# Patient Record
Sex: Female | Born: 1960
Health system: Southern US, Community
[De-identification: ages and names within clinical notes are randomized; demographics above are authoritative.]

## PROBLEM LIST (undated history)

## (undated) DIAGNOSIS — K257 Chronic gastric ulcer without hemorrhage or perforation: Secondary | ICD-10-CM

## (undated) DIAGNOSIS — M1990 Unspecified osteoarthritis, unspecified site: Secondary | ICD-10-CM

## (undated) DIAGNOSIS — K743 Primary biliary cirrhosis: Secondary | ICD-10-CM

## (undated) DIAGNOSIS — E785 Hyperlipidemia, unspecified: Secondary | ICD-10-CM

## (undated) DIAGNOSIS — K922 Gastrointestinal hemorrhage, unspecified: Secondary | ICD-10-CM

## (undated) DIAGNOSIS — I10 Essential (primary) hypertension: Secondary | ICD-10-CM

## (undated) DIAGNOSIS — F32A Depression, unspecified: Secondary | ICD-10-CM

## (undated) DIAGNOSIS — K5190 Ulcerative colitis, unspecified, without complications: Secondary | ICD-10-CM

## (undated) DIAGNOSIS — K219 Gastro-esophageal reflux disease without esophagitis: Secondary | ICD-10-CM

## (undated) DIAGNOSIS — B20 Human immunodeficiency virus [HIV] disease: Secondary | ICD-10-CM

## (undated) DIAGNOSIS — Z21 Asymptomatic human immunodeficiency virus [HIV] infection status: Secondary | ICD-10-CM

## (undated) DIAGNOSIS — F1910 Other psychoactive substance abuse, uncomplicated: Secondary | ICD-10-CM

## (undated) DIAGNOSIS — IMO0001 Reserved for inherently not codable concepts without codable children: Secondary | ICD-10-CM

## (undated) HISTORY — DX: Unspecified osteoarthritis, unspecified site: M19.90

## (undated) HISTORY — PX: ESOPHAGOGASTRODUODENOSCOPY: SHX1529

## (undated) HISTORY — DX: Reserved for inherently not codable concepts without codable children: IMO0001

## (undated) HISTORY — DX: Hyperlipidemia, unspecified: E78.5

## (undated) HISTORY — DX: Primary biliary cirrhosis: K74.3

## (undated) HISTORY — DX: Chronic gastric ulcer without hemorrhage or perforation: K25.7

## (undated) HISTORY — DX: Depression, unspecified: F32.A

## (undated) HISTORY — PX: COLONOSCOPY: SHX174

---

## 1999-06-18 HISTORY — PX: ABDOMINAL HYSTERECTOMY: SHX81

## 2014-04-25 ENCOUNTER — Encounter (HOSPITAL_BASED_OUTPATIENT_CLINIC_OR_DEPARTMENT_OTHER): Payer: Self-pay | Admitting: *Deleted

## 2014-04-25 ENCOUNTER — Emergency Department (HOSPITAL_BASED_OUTPATIENT_CLINIC_OR_DEPARTMENT_OTHER): Payer: Medicaid Other

## 2014-04-25 ENCOUNTER — Inpatient Hospital Stay (HOSPITAL_BASED_OUTPATIENT_CLINIC_OR_DEPARTMENT_OTHER)
Admission: EM | Admit: 2014-04-25 | Discharge: 2014-05-03 | DRG: 974 | Disposition: A | Discharge status: Home / Self Care | Payer: Medicaid Other | Attending: Internal Medicine | Admitting: Internal Medicine

## 2014-04-25 ENCOUNTER — Inpatient Hospital Stay (HOSPITAL_COMMUNITY): Payer: Medicaid Other

## 2014-04-25 DIAGNOSIS — K5190 Ulcerative colitis, unspecified, without complications: Secondary | ICD-10-CM | POA: Diagnosis present

## 2014-04-25 DIAGNOSIS — B1920 Unspecified viral hepatitis C without hepatic coma: Secondary | ICD-10-CM | POA: Diagnosis present

## 2014-04-25 DIAGNOSIS — Y95 Nosocomial condition: Secondary | ICD-10-CM | POA: Diagnosis present

## 2014-04-25 DIAGNOSIS — F17210 Nicotine dependence, cigarettes, uncomplicated: Secondary | ICD-10-CM | POA: Diagnosis present

## 2014-04-25 DIAGNOSIS — J984 Other disorders of lung: Secondary | ICD-10-CM | POA: Diagnosis not present

## 2014-04-25 DIAGNOSIS — J9601 Acute respiratory failure with hypoxia: Secondary | ICD-10-CM | POA: Diagnosis present

## 2014-04-25 DIAGNOSIS — Z79899 Other long term (current) drug therapy: Secondary | ICD-10-CM

## 2014-04-25 DIAGNOSIS — Z681 Body mass index (BMI) 19 or less, adult: Secondary | ICD-10-CM

## 2014-04-25 DIAGNOSIS — E876 Hypokalemia: Secondary | ICD-10-CM | POA: Diagnosis present

## 2014-04-25 DIAGNOSIS — E43 Unspecified severe protein-calorie malnutrition: Secondary | ICD-10-CM | POA: Insufficient documentation

## 2014-04-25 DIAGNOSIS — B20 Human immunodeficiency virus [HIV] disease: Secondary | ICD-10-CM | POA: Diagnosis present

## 2014-04-25 DIAGNOSIS — J189 Pneumonia, unspecified organism: Secondary | ICD-10-CM | POA: Diagnosis present

## 2014-04-25 DIAGNOSIS — I10 Essential (primary) hypertension: Secondary | ICD-10-CM | POA: Diagnosis present

## 2014-04-25 DIAGNOSIS — R109 Unspecified abdominal pain: Secondary | ICD-10-CM | POA: Diagnosis present

## 2014-04-25 DIAGNOSIS — D473 Essential (hemorrhagic) thrombocythemia: Secondary | ICD-10-CM | POA: Diagnosis present

## 2014-04-25 DIAGNOSIS — Z21 Asymptomatic human immunodeficiency virus [HIV] infection status: Secondary | ICD-10-CM | POA: Diagnosis present

## 2014-04-25 DIAGNOSIS — K625 Hemorrhage of anus and rectum: Secondary | ICD-10-CM | POA: Diagnosis present

## 2014-04-25 DIAGNOSIS — K219 Gastro-esophageal reflux disease without esophagitis: Secondary | ICD-10-CM | POA: Diagnosis present

## 2014-04-25 DIAGNOSIS — E860 Dehydration: Secondary | ICD-10-CM | POA: Diagnosis present

## 2014-04-25 DIAGNOSIS — E871 Hypo-osmolality and hyponatremia: Secondary | ICD-10-CM | POA: Diagnosis present

## 2014-04-25 DIAGNOSIS — I959 Hypotension, unspecified: Secondary | ICD-10-CM | POA: Diagnosis not present

## 2014-04-25 DIAGNOSIS — B9562 Methicillin resistant Staphylococcus aureus infection as the cause of diseases classified elsewhere: Secondary | ICD-10-CM | POA: Diagnosis present

## 2014-04-25 DIAGNOSIS — D638 Anemia in other chronic diseases classified elsewhere: Secondary | ICD-10-CM | POA: Diagnosis present

## 2014-04-25 DIAGNOSIS — Z9889 Other specified postprocedural states: Secondary | ICD-10-CM

## 2014-04-25 DIAGNOSIS — R933 Abnormal findings on diagnostic imaging of other parts of digestive tract: Secondary | ICD-10-CM | POA: Insufficient documentation

## 2014-04-25 DIAGNOSIS — R52 Pain, unspecified: Secondary | ICD-10-CM

## 2014-04-25 DIAGNOSIS — R1030 Lower abdominal pain, unspecified: Secondary | ICD-10-CM

## 2014-04-25 DIAGNOSIS — K51911 Ulcerative colitis, unspecified with rectal bleeding: Secondary | ICD-10-CM

## 2014-04-25 DIAGNOSIS — Z9119 Patient's noncompliance with other medical treatment and regimen: Secondary | ICD-10-CM | POA: Diagnosis present

## 2014-04-25 DIAGNOSIS — E781 Pure hyperglyceridemia: Secondary | ICD-10-CM | POA: Diagnosis not present

## 2014-04-25 HISTORY — DX: Asymptomatic human immunodeficiency virus (hiv) infection status: Z21

## 2014-04-25 HISTORY — DX: Essential (primary) hypertension: I10

## 2014-04-25 HISTORY — DX: Other psychoactive substance abuse, uncomplicated: F19.10

## 2014-04-25 HISTORY — DX: Ulcerative colitis, unspecified, without complications: K51.90

## 2014-04-25 HISTORY — DX: Human immunodeficiency virus (HIV) disease: B20

## 2014-04-25 HISTORY — DX: Gastro-esophageal reflux disease without esophagitis: K21.9

## 2014-04-25 HISTORY — DX: Gastrointestinal hemorrhage, unspecified: K92.2

## 2014-04-25 LAB — URINE MICROSCOPIC-ADD ON

## 2014-04-25 LAB — URINALYSIS, ROUTINE W REFLEX MICROSCOPIC
Bilirubin Urine: NEGATIVE
GLUCOSE, UA: NEGATIVE mg/dL
Ketones, ur: NEGATIVE mg/dL
Nitrite: NEGATIVE
Protein, ur: NEGATIVE mg/dL
SPECIFIC GRAVITY, URINE: 1.031 — AB (ref 1.005–1.030)
Urobilinogen, UA: 0.2 mg/dL (ref 0.0–1.0)
pH: 6 (ref 5.0–8.0)

## 2014-04-25 LAB — COMPREHENSIVE METABOLIC PANEL
ALK PHOS: 377 U/L — AB (ref 39–117)
ALT: 8 U/L (ref 0–35)
AST: 19 U/L (ref 0–37)
Albumin: 2 g/dL — ABNORMAL LOW (ref 3.5–5.2)
Anion gap: 12 (ref 5–15)
BILIRUBIN TOTAL: 0.4 mg/dL (ref 0.3–1.2)
BUN: 17 mg/dL (ref 6–23)
CHLORIDE: 91 meq/L — AB (ref 96–112)
CO2: 28 meq/L (ref 19–32)
Calcium: 9 mg/dL (ref 8.4–10.5)
Creatinine, Ser: 0.8 mg/dL (ref 0.50–1.10)
GFR calc Af Amer: >90 mL/min (ref >90)
GFR, EST NON AFRICAN AMERICAN: 81 mL/min — AB (ref >90)
Glucose, Bld: 162 mg/dL — ABNORMAL HIGH (ref 70–99)
POTASSIUM: 3.3 meq/L — AB (ref 3.7–5.3)
Sodium: 131 mEq/L — ABNORMAL LOW (ref 137–147)
Total Protein: 8.8 g/dL — ABNORMAL HIGH (ref 6.0–8.3)

## 2014-04-25 LAB — CBC WITH DIFFERENTIAL/PLATELET
BASOS ABS: 0 10*3/uL (ref 0.0–0.1)
BASOS PCT: 0 % (ref 0–1)
Eosinophils Absolute: 0 10*3/uL (ref 0.0–0.7)
Eosinophils Relative: 0 % (ref 0–5)
HCT: 25.8 % — ABNORMAL LOW (ref 36.0–46.0)
HEMOGLOBIN: 9.3 g/dL — AB (ref 12.0–15.0)
LYMPHS PCT: 39 % (ref 12–46)
Lymphs Abs: 3.6 10*3/uL (ref 0.7–4.0)
MCH: 28.8 pg (ref 26.0–34.0)
MCHC: 36 g/dL (ref 30.0–36.0)
MCV: 79.9 fL (ref 78.0–100.0)
MONOS PCT: 11 % (ref 3–12)
Monocytes Absolute: 1 10*3/uL (ref 0.1–1.0)
NEUTROS ABS: 4.7 10*3/uL (ref 1.7–7.7)
NEUTROS PCT: 50 % (ref 43–77)
Platelets: 797 10*3/uL — ABNORMAL HIGH (ref 150–400)
RBC: 3.23 MIL/uL — ABNORMAL LOW (ref 3.87–5.11)
RDW: 13.3 % (ref 11.5–15.5)
WBC: 9.3 10*3/uL (ref 4.0–10.5)

## 2014-04-25 LAB — LIPASE, BLOOD: Lipase: 55 U/L (ref 11–59)

## 2014-04-25 LAB — OCCULT BLOOD X 1 CARD TO LAB, STOOL: Fecal Occult Bld: POSITIVE — AB

## 2014-04-25 MED ORDER — VANCOMYCIN HCL 500 MG IV SOLR: 500.0000 mg | Freq: Three times a day (TID) | INTRAVENOUS | Status: DC

## 2014-04-25 MED ORDER — SODIUM CHLORIDE 0.9 % IV BOLUS (SEPSIS)
2000.0000 mL | Freq: Once | INTRAVENOUS | Status: AC
Start: 2014-04-25 — End: 2014-04-25
  Administered 2014-04-25: 2000 mL via INTRAVENOUS

## 2014-04-25 MED ORDER — METHYLPREDNISOLONE SODIUM SUCC 125 MG IJ SOLR
60.0000 mg | Freq: Once | INTRAMUSCULAR | Status: AC
  Administered 2014-04-25: 60 mg via INTRAVENOUS
  Filled 2014-04-25: qty 2

## 2014-04-25 MED ORDER — SODIUM CHLORIDE 0.9 % IV SOLN
INTRAVENOUS | Status: DC
  Administered 2014-04-26: 07:00:00 via INTRAVENOUS
  Administered 2014-04-27: 1000 mL via INTRAVENOUS
  Administered 2014-04-28 – 2014-05-01 (×6): via INTRAVENOUS
  Administered 2014-05-02: 500 mL via INTRAVENOUS
  Administered 2014-05-02 – 2014-05-03 (×2): via INTRAVENOUS

## 2014-04-25 MED ORDER — ONDANSETRON HCL 4 MG/2ML IJ SOLN
4.0000 mg | Freq: Three times a day (TID) | INTRAMUSCULAR | Status: AC | PRN
Start: 2014-04-25 — End: 2014-04-26

## 2014-04-25 MED ORDER — POTASSIUM CHLORIDE CRYS ER 20 MEQ PO TBCR
40.0000 meq | EXTENDED_RELEASE_TABLET | Freq: Once | ORAL | Status: AC
  Administered 2014-04-25: 40 meq via ORAL
  Filled 2014-04-25: qty 2

## 2014-04-25 MED ORDER — CEFEPIME HCL 1 G IJ SOLR
1.0000 g | Freq: Three times a day (TID) | INTRAMUSCULAR | Status: DC
  Administered 2014-04-25 – 2014-04-26 (×2): 1 g via INTRAVENOUS
  Filled 2014-04-25 (×5): qty 1

## 2014-04-25 MED ORDER — SODIUM CHLORIDE 0.9 % IV SOLN: INTRAVENOUS | Status: AC

## 2014-04-25 MED ORDER — HEPARIN SODIUM (PORCINE) 5000 UNIT/ML IJ SOLN
5000.0000 [IU] | Freq: Three times a day (TID) | INTRAMUSCULAR | Status: DC
  Administered 2014-04-25 – 2014-05-03 (×20): 5000 [IU] via SUBCUTANEOUS
  Filled 2014-04-25 (×26): qty 1

## 2014-04-25 MED ORDER — PENTAFLUOROPROP-TETRAFLUOROETH EX AERO
INHALATION_SPRAY | CUTANEOUS | Status: AC
  Filled 2014-04-25: qty 30

## 2014-04-25 MED ORDER — SODIUM CHLORIDE 0.9 % IV BOLUS (SEPSIS)
2000.0000 mL | Freq: Once | INTRAVENOUS | Status: AC
  Administered 2014-04-25: 2000 mL via INTRAVENOUS

## 2014-04-25 MED ORDER — FENTANYL CITRATE 0.05 MG/ML IJ SOLN
12.5000 ug | INTRAMUSCULAR | Status: DC | PRN
  Administered 2014-04-25 – 2014-05-03 (×47): 50 ug via INTRAVENOUS
  Filled 2014-04-25 (×49): qty 2

## 2014-04-25 MED ORDER — AMLODIPINE BESYLATE 10 MG PO TABS
10.0000 mg | ORAL_TABLET | Freq: Every day | ORAL | Status: DC
  Filled 2014-04-25: qty 1

## 2014-04-25 MED ORDER — PREDNISONE 20 MG PO TABS
20.0000 mg | ORAL_TABLET | Freq: Two times a day (BID) | ORAL | Status: DC
  Administered 2014-04-26 – 2014-05-03 (×15): 20 mg via ORAL
  Filled 2014-04-25 (×18): qty 1

## 2014-04-25 MED ORDER — MIRTAZAPINE 15 MG PO TBDP
15.0000 mg | ORAL_TABLET | Freq: Every day | ORAL | Status: DC
  Administered 2014-04-25 – 2014-04-28 (×4): 15 mg via ORAL
  Filled 2014-04-25 (×5): qty 1

## 2014-04-25 MED ORDER — FENTANYL CITRATE 0.05 MG/ML IJ SOLN
100.0000 ug | INTRAMUSCULAR | Status: AC
  Administered 2014-04-25: 100 ug via INTRAVENOUS
  Filled 2014-04-25: qty 2

## 2014-04-25 MED ORDER — PANTOPRAZOLE SODIUM 40 MG PO TBEC
40.0000 mg | DELAYED_RELEASE_TABLET | Freq: Every day | ORAL | Status: DC
  Administered 2014-04-25 – 2014-05-03 (×9): 40 mg via ORAL
  Filled 2014-04-25 (×9): qty 1

## 2014-04-25 MED ORDER — HYDRALAZINE HCL 50 MG PO TABS
50.0000 mg | ORAL_TABLET | Freq: Three times a day (TID) | ORAL | Status: DC
  Filled 2014-04-25 (×4): qty 1

## 2014-04-25 MED ORDER — METOCLOPRAMIDE HCL 5 MG/ML IJ SOLN
5.0000 mg | Freq: Once | INTRAMUSCULAR | Status: AC
  Administered 2014-04-25: 5 mg via INTRAMUSCULAR
  Filled 2014-04-25: qty 2

## 2014-04-25 MED ORDER — SULFASALAZINE 500 MG PO TABS
1000.0000 mg | ORAL_TABLET | Freq: Two times a day (BID) | ORAL | Status: DC
  Administered 2014-04-25 – 2014-05-03 (×16): 1000 mg via ORAL
  Filled 2014-04-25 (×17): qty 2

## 2014-04-25 MED ORDER — VANCOMYCIN HCL IN DEXTROSE 750-5 MG/150ML-% IV SOLN
750.0000 mg | Freq: Two times a day (BID) | INTRAVENOUS | Status: DC
  Filled 2014-04-25 (×3): qty 150

## 2014-04-25 MED ORDER — FUROSEMIDE 20 MG PO TABS
20.0000 mg | ORAL_TABLET | Freq: Every day | ORAL | Status: DC
  Filled 2014-04-25: qty 1

## 2014-04-25 MED ORDER — FENTANYL CITRATE 0.05 MG/ML IJ SOLN
12.5000 ug | INTRAMUSCULAR | Status: DC | PRN
  Administered 2014-04-25: 25 ug via INTRAVENOUS
  Filled 2014-04-25: qty 2

## 2014-04-25 MED ORDER — FENTANYL CITRATE 0.05 MG/ML IJ SOLN
100.0000 ug | Freq: Once | INTRAMUSCULAR | Status: AC
  Administered 2014-04-25: 100 ug via INTRAVENOUS
  Filled 2014-04-25: qty 2

## 2014-04-25 MED ORDER — IOHEXOL 300 MG/ML  SOLN
100.0000 mL | Freq: Once | INTRAMUSCULAR | Status: AC | PRN
  Administered 2014-04-25: 100 mL via INTRAVENOUS

## 2014-04-25 NOTE — Progress Notes (Addendum)
ANTIBIOTIC CONSULT NOTE - INITIAL  Pharmacy Consult for Vancomycin and Cefepime Indication: rule out pneumonia  No Known Allergies  Patient Measurements: Height: 5' 8"  (172.7 cm) Weight: 105 lb (47.628 kg) IBW/kg (Calculated) : 63.9 Adjusted Body Weight: 48 kg  Vital Signs: Temp: 99.9 F (37.7 C) (11/09 1534) Temp Source: Oral (11/09 1534) BP: 103/51 mmHg (11/09 1708) Pulse Rate: 103 (11/09 1721) Intake/Output from previous day:   Intake/Output from this shift:    Labs:  Recent Labs  04/25/14 1221  WBC 9.3  HGB 9.3*  PLT 797*  CREATININE 0.80   Estimated Creatinine Clearance: 59.7 mL/min (by C-G formula based on Cr of 0.8). No results for input(s): VANCOTROUGH, VANCOPEAK, VANCORANDOM, GENTTROUGH, GENTPEAK, GENTRANDOM, TOBRATROUGH, TOBRAPEAK, TOBRARND, AMIKACINPEAK, AMIKACINTROU, AMIKACIN in the last 72 hours.   Microbiology: No results found for this or any previous visit (from the past 720 hour(s)).  Medical History: Past Medical History  Diagnosis Date  . Ulcerative colitis   . GERD (gastroesophageal reflux disease)   . Hypertension   . HIV (human immunodeficiency virus infection)   . Hepatitis C   . Substance abuse   . GI bleed     Medications:   (Not in a hospital admission) Scheduled:  . methylPREDNISolone (SOLU-MEDROL) injection  60 mg Intravenous Once  . potassium chloride SA  40 mEq Oral Once   Infusions:  . sodium chloride     Assessment: 53yo female with ulcerative colitis and recently diagnosed HIV presents to [facility] with lower abdominal pain, rectal bleeding, and emesis. Pharmacy is consulted to dose vancomycin and cefepime for HCAP rule out. Pt is febrile to 99.9, WBC 9.3, sCr 0.8 with CrCl ~ 90 mL/min.   Goal of Therapy:  Vancomycin trough level 15-20 mcg/ml  Plan:  Vancomycin 545m IV q8h Cefepime 1g IV q8h Measure antibiotic drug levels at steady state Follow up culture results, LOT, and renal function  CAndrey Cota BDiona Foley  PharmD Clinical Pharmacist Pager [phone]/02/2014,5:45 PM

## 2014-04-25 NOTE — Progress Notes (Signed)
Admit to [facility] from [facility]  Was called from [facility]. Patient is a 53 year old female history of ulcerative colitis, hep C, recently diagnosed HIV July 2015 recently hospitalized at [facility] for ulcerative colitis flare was discharged on prednisone and sulfasalazine however patient stated that wasn't makes up and couldn't fill those prescriptions. Patient presented to the ED with lower abdominal pain decreased appetite weight loss rectal bleeding some emesis since discharge 2 weeks. Patient also with generalized weakness. Patient refusing to go back to [facility] and a such been admitted here. Possible ulcerative colitis flare.patient noted to be dehydrated and cachectic and chronically ill-appearing. CT abdomen and pelvis with numerous cavitary lesions in the lung bases considerations include inflammatory versus infectious processes versus vasculitis. Significant wall thickening of the entire colon most notably in the distal aspect. To be infectious. Inflammatory causes could have a similar appearance. Adenopathy. Negative for bowel obstruction or abscess.chest x-[place] pending. Patient to be given Solu-Medrol 60 mg IV 1 in the ED prior to transfer. Discharge summary and H&P from [facility] hospitalization to come with the patient. Patient to be accepted to MedSurg BED at [facility] for probable ulcerative colitis flare and abnormal CT findings. Please follow-up on chest x-[place]. Likely needs GI consult, ID consult.

## 2014-04-25 NOTE — ED Notes (Signed)
Attempted report 

## 2014-04-25 NOTE — Assessment & Plan Note (Signed)
DO NOT SHARE WITH FAMILY PER PATIENT!

## 2014-04-25 NOTE — ED Provider Notes (Signed)
CSN: [id number]     Arrival date & time 04/25/14  1149 History   First MD Initiated Contact with Patient 04/25/14 1256     Chief Complaint  Patient presents with  . Emesis     (Consider location/radiation/quality/duration/timing/severity/associated sxs/prior Treatment) HPI Complains of lower abdominal pain vomiting diminished appetite and weight loss onset approximately 2 weeks ago since discharge from [facility] for ulcerative colitis. She admits to blood per rectum. Has vomited twice had only one [place] sure today. She also admits to generalized weakness. Pain is constant. Nothing makes pain better or worse. Patient was unable to get her prescriptions filled for Remeron, protonix, Deltasone and sulfasalazine due to mixup at pharmacy.no known fever no other associated symptom Past Medical History  Diagnosis Date  . Ulcerative colitis   . GERD (gastroesophageal reflux disease)   . Hypertension   HIV positive, hepatitis C, ulcerative colitis Past Surgical History  Procedure Laterality Date  . Abdominal hysterectomy     No family history on file. History  Substance Use Topics  . Smoking status: Never Smoker   . Smokeless tobacco: Not on file  . Alcohol Use: No   OB History    No data available     Review of Systems  Constitutional: Positive for appetite change and unexpected weight change.  HENT: Negative.   Respiratory: Negative.  Negative for cough.   Cardiovascular: Negative.   Gastrointestinal: Positive for nausea, vomiting, abdominal pain and blood in stool.  Musculoskeletal: Negative.   Skin: Negative.   Neurological: Positive for weakness.  Psychiatric/Behavioral: Negative.   All other systems reviewed and are negative.     Allergies  Review of patient's allergies indicates no known allergies.  Home Medications   Prior to Admission medications   Medication Sig Start Date End Date Taking? Authorizing Provider  amLODipine (NORVASC) 10 MG  tablet Take 10 mg by mouth daily.   Yes Historical Provider, MD  furosemide (LASIX) 20 MG tablet Take 20 mg by mouth.   Yes Historical Provider, MD  hydrALAZINE (APRESOLINE) 50 MG tablet Take 50 mg by mouth 3 (three) times daily.   Yes Historical Provider, MD  mirtazapine (REMERON SOL-TAB) 15 MG disintegrating tablet Take 15 mg by mouth at bedtime.   Yes Historical Provider, MD  pantoprazole (PROTONIX) 40 MG tablet Take 40 mg by mouth daily.   Yes Historical Provider, MD  predniSONE (DELTASONE) 20 MG tablet Take 20 mg by mouth 2 (two) times daily with a meal.   Yes Historical Provider, MD  sulfaSALAzine (AZULFIDINE) 500 MG tablet Take 1,000 mg by mouth 2 (two) times daily.   Yes Historical Provider, MD   BP 113/63 mmHg  Pulse 117  Temp(Src) 98.9 F (37.2 C) (Oral)  Resp 24  Ht 5' 8"  (1.727 m)  Wt 105 lb (47.628 kg)  BMI 15.97 kg/m2  SpO2 100% Physical Exam  Constitutional: She appears well-developed and well-nourished. She appears distressed.  Appears uncomfortable, chronically ill-appearing, cachectic  HENT:  Head: Normocephalic and atraumatic.  Mucous membranes dry  Eyes: Conjunctivae are normal. Pupils are equal, round, and reactive to light.  Neck: Neck supple. No tracheal deviation present. No thyromegaly present.  Cardiovascular: Regular rhythm.   No murmur heard. Mildly tachycardic  Pulmonary/Chest: Effort normal and breath sounds normal.  Abdominal: Soft. Bowel sounds are normal. She exhibits no distension. There is tenderness.  Tender over lower quadrants bilaterally.  Genitourinary:  Rectum:External hemorrhoidsgrossly brown stool  Musculoskeletal: Normal range of motion. She exhibits no  edema or tenderness.  Neurological: She is alert. Coordination normal.  Skin: Skin is warm and dry. No rash noted.  Psychiatric: She has a normal mood and affect.  Nursing note and vitals reviewed.   ED Course  Procedures (including critical care time) Labs Review Labs Reviewed   CBC WITH DIFFERENTIAL - Abnormal; Notable for the following:    RBC 3.23 (*)    Hemoglobin 9.3 (*)    HCT 25.8 (*)    Platelets 797 (*)    All other components within normal limits  COMPREHENSIVE METABOLIC PANEL - Abnormal; Notable for the following:    Sodium 131 (*)    Potassium 3.3 (*)    Chloride 91 (*)    Glucose, Bld 162 (*)    Total Protein 8.8 (*)    Albumin 2.0 (*)    Alkaline Phosphatase 377 (*)    GFR calc non Af Amer 81 (*)    All other components within normal limits    Imaging Review No results found.   EKG Interpretation None      Results for orders placed or performed during the hospital encounter of 04/25/14  CBC WITH DIFFERENTIAL  Result Value Ref Range   WBC 9.3 4.0 - 10.5 K/uL   RBC 3.23 (L) 3.87 - 5.11 MIL/uL   Hemoglobin 9.3 (L) 12.0 - 15.0 g/dL   HCT 25.8 (L) 36.0 - 46.0 %   MCV 79.9 78.0 - 100.0 fL   MCH 28.8 26.0 - 34.0 pg   MCHC 36.0 30.0 - 36.0 g/dL   RDW 13.3 11.5 - 15.5 %   Platelets 797 (H) 150 - 400 K/uL   Neutrophils Relative % 50 43 - 77 %   Neutro Abs 4.7 1.7 - 7.7 K/uL   Lymphocytes Relative 39 12 - 46 %   Lymphs Abs 3.6 0.7 - 4.0 K/uL   Monocytes Relative 11 3 - 12 %   Monocytes Absolute 1.0 0.1 - 1.0 K/uL   Eosinophils Relative 0 0 - 5 %   Eosinophils Absolute 0.0 0.0 - 0.7 K/uL   Basophils Relative 0 0 - 1 %   Basophils Absolute 0.0 0.0 - 0.1 K/uL  Comprehensive metabolic panel  Result Value Ref Range   Sodium 131 (L) 137 - 147 mEq/L   Potassium 3.3 (L) 3.7 - 5.3 mEq/L   Chloride 91 (L) 96 - 112 mEq/L   CO2 28 19 - 32 mEq/L   Glucose, Bld 162 (H) 70 - 99 mg/dL   BUN 17 6 - 23 mg/dL   Creatinine, Ser 0.80 0.50 - 1.10 mg/dL   Calcium 9.0 8.4 - 10.5 mg/dL   Total Protein 8.8 (H) 6.0 - 8.3 g/dL   Albumin 2.0 (L) 3.5 - 5.2 g/dL   AST 19 0 - 37 U/L   ALT 8 0 - 35 U/L   Alkaline Phosphatase 377 (H) 39 - 117 U/L   Total Bilirubin 0.4 0.3 - 1.2 mg/dL   GFR calc non Af Amer 81 (L) >90 mL/min   GFR calc Af Amer >90 >90  mL/min   Anion gap 12 5 - 15  Lipase, blood  Result Value Ref Range   Lipase 55 11 - 59 U/L  Urinalysis, Routine w reflex microscopic  Result Value Ref Range   Color, Urine YELLOW YELLOW   APPearance CLOUDY (A) CLEAR   Specific Gravity, Urine 1.031 (H) 1.005 - 1.030   pH 6.0 5.0 - 8.0   Glucose, UA NEGATIVE NEGATIVE mg/dL  Hgb urine dipstick TRACE (A) NEGATIVE   Bilirubin Urine NEGATIVE NEGATIVE   Ketones, ur NEGATIVE NEGATIVE mg/dL   Protein, ur NEGATIVE NEGATIVE mg/dL   Urobilinogen, UA 0.2 0.0 - 1.0 mg/dL   Nitrite NEGATIVE NEGATIVE   Leukocytes, UA SMALL (A) NEGATIVE  Occult blood card to lab, stool Provider will collect  Result Value Ref Range   Fecal Occult Bld POSITIVE (A) NEGATIVE  Urine microscopic-add on  Result Value Ref Range   Squamous Epithelial / LPF MANY (A) RARE   WBC, UA 3-6 <3 WBC/hpf   RBC / HPF 0-2 <3 RBC/hpf   Bacteria, UA MANY (A) RARE   Dg Chest 2 View  04/25/2014   CLINICAL DATA:  Weakness for 3 weeks.  EXAM: CHEST  2 VIEW  COMPARISON:  None.  FINDINGS: Ill-defined right upper lobe infiltrate with cavitation is noted. Subtle patchy nodular infiltrates in the left mid lung and left lower lobe noted. These findings are consistent with active infectious of infiltrates including granulomas infiltrates. Cavitation in the right upper lobe infiltrate may be related to developing abscess. Cavitary tumor cannot be excluded. Close follow-up chest x-rays to demonstrate clearing suggested. Prominent nipple shadows noted. Cardiomegaly with normal pulmonary vascularity. No pleural effusion or pneumothorax. No acute or focal bony abnormality.  IMPRESSION: 1. Prominent cavitary infiltrate right upper lobe. These findings are most likely infectious. Active granulomatous disease cannot be excluded. Pulmonary abscess cannot be excluded. 2. Patchy infiltrates left mid lung field a left lower lobe consistent with pneumonia. Close follow-up chest x-rays are suggested to demonstrate  clearing of these findings. Underlying tumor particularly in the right upper lobe cannot be excluded.   Electronically Signed   By: Marcello Moores  Register   On: 04/25/2014 17:11   Ct Abdomen Pelvis W Contrast  04/25/2014   CLINICAL DATA:  Lower abdominal pain. Cough, weakness. Rectal bleeding. History of ulcerative colitis, hysterectomy, hepatitis-C. History of hypertension and HIV.  EXAM: CT ABDOMEN AND PELVIS WITH CONTRAST  TECHNIQUE: Multidetector CT imaging of the abdomen and pelvis was performed using the standard protocol following bolus administration of intravenous contrast.  CONTRAST:  157m OMNIPAQUE IOHEXOL 300 MG/ML  SOLN  COMPARISON:  None.  FINDINGS: Lower chest: Patchy densities are identified at the lung bases, associated a small cavitary lesions. The heart size is normal.  Upper abdomen: Nonspecific low-attenuation lesion is identified within the posterior right hepatic lobe measuring 7 mm in diameter. A similar nodule is identified in the left hepatic lobe on image 18, also measuring 7 mm in diameter. There is mild periportal edema. No focal abnormality identified within the spleen or pancreas. The adrenal glands have a normal appearance. Kidneys show symmetric bilateral excretion. Small left renal cyst is 8 mm in diameter. Gallbladder is present.  Gastrointestinal tract: The stomach and small bowel loops are normal in appearance. There is diffuse thickening of the ascending, transverse, descending, and sigmoid colonic wall consistent with colitis.  Pelvis: Urinary bladder has a normal appearance. The uterus is absent. No adnexal mass identified.  Retroperitoneum: There are small retroperitoneal and mesenteric lymph nodes. Periportal lymph nodes appear fluid and somewhat indistinct. The largest left periaortic lymph node measures 1.6 cm in diameter. There is atherosclerotic calcification of the abdominal aorta. No aneurysm.  Abdominal wall: Unremarkable.  Osseous structures: Unremarkable.  IMPRESSION:  1. Numerous cavitary lesions at the lung bases with considerations including inflammatory and infectious processes. Vasculitis can have a similar appearance but is felt to be less likely. Consider further evaluation with  chest x-Traub and/or chest CT. 2. Significant wall thickening of the entire colon, most notable in the distal aspect, favored to be infectious. Inflammatory causes could have a similar appearance. 3. Periportal and retroperitoneal adenopathy. 4. No abscess or bowel obstruction.   Electronically Signed   By: Shon Hale M.D.   On: 04/25/2014 16:19    2 15  p.m. Patient felt improved after treatment with intravenous fluids, opioids and antiemetics. 230 p.m. Requesting additional pain medicine. Fentanyl ordered  5:20 PM patient feels much improved after treatment with intravenous opioids and intravenous fluids. Resting comfortably. MDM  spoke with Dr. Cline Cools. We will transfer patient to [facility], MedSurg bedplan intravenous steroids, potassium supplementation. We really favor inflammatory versus infectious process in light of the fact the patient improved with after treatment with intravenous steroids while at [facility]. Diagnosis #1 abdominal pain #2 hypokalemia #3 anemia #4 thrombocytosis #5hyperglycemia #6 hyponatremia #7 dehydration  Final diagnoses:  None        Orlie Dakin, MD 04/25/14 1725

## 2014-04-25 NOTE — ED Notes (Signed)
Patient not eating due to vomiting after intake. Greater than 20lb weight loss in the past 3-4 weeks. Tearful on assessment and unable to care for herself

## 2014-04-25 NOTE — ED Notes (Signed)
Weakness for 3 weeks since leaving [facility]. Cough, rectal bleeding, not eating, and weakness.

## 2014-04-25 NOTE — H&P (Signed)
[facility] History and Physical  [name] [name] YIR:[id number] DOB: [birth year] DOA: 04/25/2014  Referring physician: EDP PCP: No primary care provider on file.   Chief Complaint: Abdominal pain   HPI: [name] [name] is a 53 y.o. female who presents to the ED at [facility] with 2 week history of lower abdominal pain, vomiting, weight loss, and diminished appetite.  These symptoms onset approximately 2 weeks ago following her discharge from [facility] where she was admitted for UC.  She admits to BRBPR, pain is constant, nothing makes it worse, solumedrol given in [place] ED has made it better.  Patient reports being unable to get prescriptions filled for prednisone and sulfasalazine since discharge from [facility].  Patient also has history of IVDU, was diagnosed with HIV a couple of months ago and has not been seen by an ID specialist nor started on HAART therapy.  Work up in the ED today revealed cavitary lesions in B lower lobes on CT abd pelvis, a CXR was performed which demonstrates a very prominate cavitary lesion in her RUL, as well as findings of PNA in her left lower lobe.  There is no mention of cavitary lesions on her [place] [facility] H+P nor discharge summary and patient reports that she did not have a CXR during that admit.  Review of Systems: Positive for B symptoms, cough especially this past week. Systems reviewed.  As above, otherwise negative  Past Medical History  Diagnosis Date  . Ulcerative colitis   . GERD (gastroesophageal reflux disease)   . Hypertension   . HIV (human immunodeficiency virus infection)   . Hepatitis C   . Substance abuse   . GI bleed    Past Surgical History  Procedure Laterality Date  . Abdominal hysterectomy     Social History:  reports that she has never smoked. She has never used smokeless tobacco. She reports that she does not drink alcohol or use illicit drugs.  No Known Allergies  No family history on file.   Prior to  Admission medications   Medication Sig Start Date End Date Taking? Authorizing Provider  amLODipine (NORVASC) 10 MG tablet Take 10 mg by mouth daily.   Yes Historical Provider, MD  furosemide (LASIX) 20 MG tablet Take 20 mg by mouth.   Yes Historical Provider, MD  hydrALAZINE (APRESOLINE) 50 MG tablet Take 50 mg by mouth 3 (three) times daily.   Yes Historical Provider, MD  mirtazapine (REMERON SOL-TAB) 15 MG disintegrating tablet Take 15 mg by mouth at bedtime.   Yes Historical Provider, MD  pantoprazole (PROTONIX) 40 MG tablet Take 40 mg by mouth daily.   Yes Historical Provider, MD  predniSONE (DELTASONE) 20 MG tablet Take 20 mg by mouth 2 (two) times daily with a meal.   Yes Historical Provider, MD  sulfaSALAzine (AZULFIDINE) 500 MG tablet Take 1,000 mg by mouth 2 (two) times daily.   Yes Historical Provider, MD   Physical Exam: Filed Vitals:   04/25/14 1908  BP: 90/44  Pulse: 100  Temp: 99.9 F (37.7 C)  Resp:     BP 90/44 mmHg  Pulse 100  Temp(Src) 99.9 F (37.7 C) (Oral)  Resp 22  Ht 5' 8"  (1.727 m)  Wt 47.628 kg (105 lb)  BMI 15.97 kg/m2  SpO2 96%  General Appearance:    Alert, oriented, no distress, appears stated age  Head:    Normocephalic, atraumatic  Eyes:    PERRL, EOMI, sclera non-icteric  Nose:   Nares without drainage or epistaxis. Mucosa, turbinates normal  Throat:   Moist mucous membranes. Oropharynx without erythema or exudate.  Neck:   Supple. No carotid bruits.  No thyromegaly.  No lymphadenopathy.   Back:     No CVA tenderness, no spinal tenderness  Lungs:     Clear to auscultation bilaterally, without wheezes, rhonchi or rales  Chest wall:    No tenderness to palpitation  Heart:    Regular rate and rhythm without murmurs, gallops, rubs  Abdomen:     Soft, non-tender, nondistended, normal bowel sounds, no organomegaly  Genitalia:    deferred  Rectal:    deferred  Extremities:   No clubbing, cyanosis or edema.  Pulses:   2+ and symmetric all  extremities  Skin:   Skin color, texture, turgor normal, no rashes or lesions  Lymph nodes:   Cervical, supraclavicular, and axillary nodes normal  Neurologic:   CNII-XII intact. Normal strength, sensation and reflexes      throughout    Labs on Admission:  Basic Metabolic Panel:  Recent Labs Lab 04/25/14 1221  NA 131*  K 3.3*  CL 91*  CO2 28  GLUCOSE 162*  BUN 17  CREATININE 0.80  CALCIUM 9.0   Liver Function Tests:  Recent Labs Lab 04/25/14 1221  AST 19  ALT 8  ALKPHOS 377*  BILITOT 0.4  PROT 8.8*  ALBUMIN 2.0*    Recent Labs Lab 04/25/14 1221  LIPASE 55   No results for input(s): AMMONIA in the last 168 hours. CBC:  Recent Labs Lab 04/25/14 1221  WBC 9.3  NEUTROABS 4.7  HGB 9.3*  HCT 25.8*  MCV 79.9  PLT 797*   Cardiac Enzymes: No results for input(s): CKTOTAL, CKMB, CKMBINDEX, TROPONINI in the last 168 hours.  BNP (last 3 results) No results for input(s): PROBNP in the last 8760 hours. CBG: No results for input(s): GLUCAP in the last 168 hours.  Radiological Exams on Admission: Dg Chest 2 View  04/25/2014   CLINICAL DATA:  Weakness for 3 weeks.  EXAM: CHEST  2 VIEW  COMPARISON:  None.  FINDINGS: Ill-defined right upper lobe infiltrate with cavitation is noted. Subtle patchy nodular infiltrates in the left mid lung and left lower lobe noted. These findings are consistent with active infectious of infiltrates including granulomas infiltrates. Cavitation in the right upper lobe infiltrate may be related to developing abscess. Cavitary tumor cannot be excluded. Close follow-up chest x-rays to demonstrate clearing suggested. Prominent nipple shadows noted. Cardiomegaly with normal pulmonary vascularity. No pleural effusion or pneumothorax. No acute or focal bony abnormality.  IMPRESSION: 1. Prominent cavitary infiltrate right upper lobe. These findings are most likely infectious. Active granulomatous disease cannot be excluded. Pulmonary abscess cannot be  excluded. 2. Patchy infiltrates left mid lung field a left lower lobe consistent with pneumonia. Close follow-up chest x-rays are suggested to demonstrate clearing of these findings. Underlying tumor particularly in the right upper lobe cannot be excluded.   Electronically Signed   By: Marcello Moores  Register   On: 04/25/2014 17:11   Ct Abdomen Pelvis W Contrast  04/25/2014   CLINICAL DATA:  Lower abdominal pain. Cough, weakness. Rectal bleeding. History of ulcerative colitis, hysterectomy, hepatitis-C. History of hypertension and HIV.  EXAM: CT ABDOMEN AND PELVIS WITH CONTRAST  TECHNIQUE: Multidetector CT imaging of the abdomen and pelvis was performed using the standard protocol following bolus administration of intravenous contrast.  CONTRAST:  146m OMNIPAQUE IOHEXOL 300 MG/ML  SOLN  COMPARISON:  None.  FINDINGS: Lower chest: Patchy densities are identified at the lung bases, associated a small cavitary lesions. The heart size is normal.  Upper abdomen: Nonspecific low-attenuation lesion is identified within the posterior right hepatic lobe measuring 7 mm in diameter. A similar nodule is identified in the left hepatic lobe on image 18, also measuring 7 mm in diameter. There is mild periportal edema. No focal abnormality identified within the spleen or pancreas. The adrenal glands have a normal appearance. Kidneys show symmetric bilateral excretion. Small left renal cyst is 8 mm in diameter. Gallbladder is present.  Gastrointestinal tract: The stomach and small bowel loops are normal in appearance. There is diffuse thickening of the ascending, transverse, descending, and sigmoid colonic wall consistent with colitis.  Pelvis: Urinary bladder has a normal appearance. The uterus is absent. No adnexal mass identified.  Retroperitoneum: There are small retroperitoneal and mesenteric lymph nodes. Periportal lymph nodes appear fluid and somewhat indistinct. The largest left periaortic lymph node measures 1.6 cm in diameter.  There is atherosclerotic calcification of the abdominal aorta. No aneurysm.  Abdominal wall: Unremarkable.  Osseous structures: Unremarkable.  IMPRESSION: 1. Numerous cavitary lesions at the lung bases with considerations including inflammatory and infectious processes. Vasculitis can have a similar appearance but is felt to be less likely. Consider further evaluation with chest x-Tropea and/or chest CT. 2. Significant wall thickening of the entire colon, most notable in the distal aspect, favored to be infectious. Inflammatory causes could have a similar appearance. 3. Periportal and retroperitoneal adenopathy. 4. No abscess or bowel obstruction.   Electronically Signed   By: Shon Hale M.D.   On: 04/25/2014 16:19    EKG: Independently reviewed.  Assessment/Plan Principal Problem:   Ulcerative colitis, acute Active Problems:   Abdominal pain   HIV positive   Cavitary lesion of lung   HCAP (healthcare-associated pneumonia)   1. UC, Acute - due to non-compliance with sulfasalazine and prednisone at home 1. Got solumedrol in ED for induction therapy 2. Put patient back on prednisone and sulfasalazine here in hospital, will avoid continuing stronger solumedrol unless absolutely needed due to concern for ongoing infectious processes detailed below. 3. Fentanyl PRN pain 2. Cavitary lesions of lung -  1. Spoke with Dr. Linus Salmons, ID to consult on patient tomorrow 2. AFB smears Q8H sputum 3. quantiferon 4. Airborne isolation 5. Blood cultures pending in case these are septic emboli from an occult endocarditis. 6. CT chest non-contrast ordered to get a better look at the RUL big cavitary lesion. 3. HCAP - 1. Cefepime and vanc 2. Sputum cultures, blood cultures 4. HIV positive 1. CD4 count pending 2. ID consulted re: candidate for HAART therapy     Code Status: Full  Family Communication: Do not communicate HIV status with family per patient (was not disclosed to family), is okay to discuss  everything else including concern for TB. Disposition Plan: Admit to inpatient   Time spent: 70 min  GARDNER, JARED M. [facility] Pager [phone]  If 7AM-7PM, please contact the day team taking care of the patient [email or web address] Password [email or web address] 04/25/2014, 8:34 PM

## 2014-04-26 ENCOUNTER — Encounter (HOSPITAL_COMMUNITY): Payer: Self-pay | Admitting: Radiology

## 2014-04-26 ENCOUNTER — Ambulatory Visit (HOSPITAL_COMMUNITY): Payer: Medicaid Other

## 2014-04-26 DIAGNOSIS — R109 Unspecified abdominal pain: Secondary | ICD-10-CM

## 2014-04-26 DIAGNOSIS — I517 Cardiomegaly: Secondary | ICD-10-CM

## 2014-04-26 DIAGNOSIS — R933 Abnormal findings on diagnostic imaging of other parts of digestive tract: Secondary | ICD-10-CM

## 2014-04-26 DIAGNOSIS — K5190 Ulcerative colitis, unspecified, without complications: Secondary | ICD-10-CM

## 2014-04-26 DIAGNOSIS — K51919 Ulcerative colitis, unspecified with unspecified complications: Secondary | ICD-10-CM

## 2014-04-26 DIAGNOSIS — J189 Pneumonia, unspecified organism: Secondary | ICD-10-CM

## 2014-04-26 DIAGNOSIS — R911 Solitary pulmonary nodule: Secondary | ICD-10-CM

## 2014-04-26 DIAGNOSIS — J984 Other disorders of lung: Secondary | ICD-10-CM

## 2014-04-26 DIAGNOSIS — E43 Unspecified severe protein-calorie malnutrition: Secondary | ICD-10-CM | POA: Insufficient documentation

## 2014-04-26 DIAGNOSIS — Z21 Asymptomatic human immunodeficiency virus [HIV] infection status: Secondary | ICD-10-CM

## 2014-04-26 LAB — T-HELPER CELLS (CD4) COUNT (NOT AT ARMC)
CD4 % Helper T Cell: 11 % — ABNORMAL LOW (ref 33–55)
CD4 T CELL ABS: 140 /uL — AB (ref 400–2700)

## 2014-04-26 LAB — LEGIONELLA ANTIGEN, URINE

## 2014-04-26 LAB — CBC
HEMATOCRIT: 21.5 % — AB (ref 36.0–46.0)
Hemoglobin: 7.5 g/dL — ABNORMAL LOW (ref 12.0–15.0)
MCH: 27.7 pg (ref 26.0–34.0)
MCHC: 34.9 g/dL (ref 30.0–36.0)
MCV: 79.3 fL (ref 78.0–100.0)
Platelets: 595 10*3/uL — ABNORMAL HIGH (ref 150–400)
RBC: 2.71 MIL/uL — ABNORMAL LOW (ref 3.87–5.11)
RDW: 14 % (ref 11.5–15.5)
WBC: 5.2 10*3/uL (ref 4.0–10.5)

## 2014-04-26 LAB — BASIC METABOLIC PANEL
Anion gap: 8 (ref 5–15)
BUN: 12 mg/dL (ref 6–23)
CHLORIDE: 103 meq/L (ref 96–112)
CO2: 24 mEq/L (ref 19–32)
Calcium: 8.2 mg/dL — ABNORMAL LOW (ref 8.4–10.5)
Creatinine, Ser: 0.55 mg/dL (ref 0.50–1.10)
GFR calc Af Amer: >90 mL/min (ref >90)
GLUCOSE: 157 mg/dL — AB (ref 70–99)
Potassium: 3.7 mEq/L (ref 3.7–5.3)
Sodium: 135 mEq/L — ABNORMAL LOW (ref 137–147)

## 2014-04-26 LAB — EXPECTORATED SPUTUM ASSESSMENT W REFEX TO RESP CULTURE

## 2014-04-26 LAB — HIV 1/2 CONFIRMATION
HIV-1 antibody: POSITIVE — AB
HIV-2 Ab: NEGATIVE

## 2014-04-26 LAB — EXPECTORATED SPUTUM ASSESSMENT W GRAM STAIN, RFLX TO RESP C

## 2014-04-26 LAB — HIV ANTIBODY (ROUTINE TESTING W REFLEX): HIV 1&2 Ab, 4th Generation: REACTIVE — AB

## 2014-04-26 LAB — ABO/RH: ABO/RH(D): B POS

## 2014-04-26 LAB — STREP PNEUMONIAE URINARY ANTIGEN: Strep Pneumo Urinary Antigen: NEGATIVE

## 2014-04-26 LAB — PREPARE RBC (CROSSMATCH)

## 2014-04-26 MED ORDER — BOOST / RESOURCE BREEZE PO LIQD
1.0000 | Freq: Three times a day (TID) | ORAL | Status: DC
  Administered 2014-04-26 – 2014-05-01 (×6): 1 via ORAL

## 2014-04-26 MED ORDER — SODIUM CHLORIDE 0.9 % IV SOLN
500.0000 mg | Freq: Three times a day (TID) | INTRAVENOUS | Status: DC
  Filled 2014-04-26 (×2): qty 500

## 2014-04-26 MED ORDER — SODIUM CHLORIDE 0.9 % IV SOLN
Freq: Once | INTRAVENOUS | Status: AC
  Administered 2014-04-26: 13:00:00 via INTRAVENOUS

## 2014-04-26 MED ORDER — ONDANSETRON HCL 4 MG/2ML IJ SOLN: 4.0000 mg | Freq: Three times a day (TID) | INTRAMUSCULAR | Status: AC | PRN

## 2014-04-26 MED ORDER — ENSURE PUDDING PO PUDG
1.0000 | Freq: Three times a day (TID) | ORAL | Status: DC
  Administered 2014-04-26 – 2014-04-27 (×2): 1 via ORAL
  Filled 2014-04-26 (×5): qty 1

## 2014-04-26 NOTE — Consult Note (Signed)
    [place] for Infectious Disease     Reason for Consult: cavitary lung lesion, HIV    Referring Physician: Dr. Charlies Silvers  Principal Problem:   Ulcerative colitis, acute Active Problems:   Abdominal pain   HIV positive   Cavitary lesion of lung   HCAP (healthcare-associated pneumonia)   . sodium chloride   Intravenous Once  . ceFEPime (MAXIPIME) IV  1 g Intravenous Q8H  . feeding supplement (ENSURE)  1 Container Oral TID BM  . heparin  5,000 Units Subcutaneous 3 times per day  . mirtazapine  15 mg Oral QHS  . pantoprazole  40 mg Oral Daily  . predniSONE  20 mg Oral BID WC  . sulfaSALAzine  1,000 mg Oral BID  . vancomycin  500 mg Intravenous Q8H    Recommendations: Will hold on starting ARVs pending ID of cavitary lesions AFB, bacterial, fungal, PCP, cytology tomorrow in am by pulmonary Will start PCP prophylaxis after BAL  I will hold antibiotics pending BAL unless blood culture turns positive Echo for ? IE  Assessment: She has HIV/AIDS with CD4 of 140, cavitary lesions with broad differential including Mycobacterial, fungal, bacterial with septic emboli, cancer.     Antibiotics: Vancomycin and cefepime  HPI: [name] [name] is a 53 y.o. female with Crohn's disease and recent hospitalization at [facility] for abdominal pain who presented to [facility] with 1 month of progressive symptoms including weight loss, cough, DOE, malaise.  She has a history of drug use and a reported IVDU history though denies now.  Has had fever and chills.  Noted on CXR to have multifocal areas and CT scan with cavitary lung lesions bilateral.  Recently found out about HIV.  Was in jail less than a month a few years ago.  Many years ago was exposed to someone with Tb.     Review of Systems: A comprehensive review of systems was negative.  Past Medical History  Diagnosis Date  . Ulcerative colitis   . GERD (gastroesophageal reflux disease)   . Hypertension   . HIV (human immunodeficiency virus  infection)   . Hepatitis C   . Substance abuse   . GI bleed     History  Substance Use Topics  . Smoking status: Never Smoker   . Smokeless tobacco: Never Used  . Alcohol Use: No    No family history on file. No Known Allergies  OBJECTIVE: Blood pressure 105/59, pulse 69, temperature 97.9 F (36.6 C), temperature source Oral, resp. rate 18, height 5' 8"  (1.727 m), weight 105 lb (47.628 kg), SpO2 100 %. General: awake, alert, nad Skin: no rashes Lungs: CTA B Cor: RRR Abdomen: soft, nt, nd Ext: no edema  Microbiology: No results found for this or any previous visit (from the past 240 hour(s)).  Scharlene Gloss, [place] for Infectious Disease [place] [email or web address] [phone] pager  [phone] cell 04/26/2014, 1:54 PM

## 2014-04-26 NOTE — Progress Notes (Addendum)
Patient ID: [name] [name], female   DOB: Jul 26, 1960, 53 y.o.   MRN: [id number]  [facility] PROGRESS NOTE  [name] [name] NUU:[id number] DOB: 10/06/1960 DOA: 04/25/2014 PCP: Pt reports she has no PCP   Brief narrative: 53 y.o. female with known history of IVDU, reently diagnosed with HIV (not seen by ID specialist and not started on HAART), who presented with 2 week history of lower abdominal pain, vomiting, weight loss, and diminished appetite, BRBPR. Recently hospitalized at [facility] for UC flare where she was treated with solumedrol and was discharged with script for Prednisone and Sulfasalazine but pt explains she was unable to get it.   Work up in the ED revealed cavitary lesions in lower lobes on CT abd/pelvis, a CXR also notable for cavitary lesion in the RUL, as well as findings of PNA in left lower lobe. There is no mention of cavitary lesions on her [place] [facility] H&P nor discharge summary and patient reports that she did not have a CXR during that admit.  Assessment/Plan:    Principal Problem:   Acute hypoxic respiratory failure  - likely multifactorial, multi focal, bilateral cavitary lesions concerning for infection including pulmonary tuberculosis, septic emboli and atypical infections - pt started on vancomycin and zosyn to cover for HCAP (given recent hospitalization and immunocompromised state) - will continue same ABX and will follow up on ID rec's - continue to provide oxygen  Active Problems:   Hypotension - pt currently on Norvasc, Hydralazine, Lasix  - BP is still soft in 90's/50's - stop Lasix as pt is on IVF, also hold Hydralazine and Norvasc until BP stabilizes  - if BP trends up start with adding hydralazine as needed and readjust the regimen as clinically indicated    Anemia of chronic disease, likely HIV - drop in hg since admission possibly dilutional from IVF pt has been receiving since admission - no signs of active bleeding, transfuse one unit of  blood (11/10) - repeat CBC in AM   Thrombocytosis - possibly secondary to an inflammatory process - Plt trending down    Hyponatremia - likely pre renal from poor oral intake - Na trending up after receiving IVF: 131 --> 135 - repeat BMP in AM   Hypokalemia  - supplemented and WNL this AM   Abdominal pain - from underlying UC imposed on new infection as noted above - currently on prednisone and sulfasalazine    HIV positive - ID consulted, appreciate assistance - will follow up on recommendations    Severe PCM - likely from underlying infection - advance diet when pt able to tolerate   DVT Prophylaxis: Heparin SQ  Code Status: Full.  Family Communication:  plan of care discussed with the patient Disposition Plan: Home when stable.   IV access:   PeripheralIV  Procedures and diagnostic studies:     Dg Chest 2 View  04/25/2014   Prominent cavitary infiltrate right upper lobe. These findings are most likely infectious. Active granulomatous disease cannot be excluded. Pulmonary abscess cannot be excluded. Patchy infiltrates left mid lung field a left lower lobe consistent with pneumonia. Close follow-up chest x-rays are suggested to demonstrate clearing of these findings. Underlying tumor particularly in the right upper lobe cannot be excluded.    Ct Chest Wo Contrast  04/26/2014  Examination is positive for multi focal, bilateral cavitary lesions with surrounding allowing glass attenuation. In a patient who is immunocompromised findings are concerning for infection including pulmonary tuberculosis, septic emboli and atypical infections. Certain  malignancy such as squamous cell carcinoma may also cavitate. No significant mediastinal or hilar adenopathy. Atherosclerotic disease including multi vessel coronary artery calcification.     Ct Abdomen Pelvis W Contrast  04/25/2014  Numerous cavitary lesions at the lung bases with considerations including inflammatory and infectious processes.  Vasculitis can have a similar appearance but is felt to be less likely. Significant wall thickening of the entire colon, most notable in the distal aspect, favored to be infectious. Inflammatory causes could have a similar appearance. Periportal and retroperitoneal adenopathy. No abscess or bowel obstruction.   Medical Consultants:   ID Other Consultants:   None  IAnti-Infectives:    Vancomycin 11/09 -->   Maxipime 11/09 -->  [name] Lenz, MD  [facility] Pager [phone]  If 7PM-7AM, please contact night-coverage [email or web address] Password [email or web address] 04/26/2014, 9:14 AM   LOS: 1 day    HPI/Subjective: No acute overnight events.  Objective: Filed Vitals:   04/25/14 1812 04/25/14 1908 04/25/14 2210 04/26/14 0446  BP: 122/70 90/44 102/55 95/58  Pulse: 102 100 86 67  Temp: 99.5 F (37.5 C) 99.9 F (37.7 C) 97.6 F (36.4 C) 97.5 F (36.4 C)  TempSrc: Oral Oral Oral Oral  Resp: 22  20 20   Height:      Weight:      SpO2: 98% 96% 100% 100%    Intake/Output Summary (Last 24 hours) at 04/26/14 0914 Last data filed at 04/26/14 0600  Gross per 24 hour  Intake 843.33 ml  Output      0 ml  Net 843.33 ml    Exam:   General:  Pt is alert, follows commands appropriately, not in acute distress  Cardiovascular: Regular rate and rhythm, S1/S2, no murmurs  Respiratory: Bilateral rhonchi, diminished air movement at bases   Abdomen: Soft, non tender, non distended, bowel sounds present   Data Reviewed: Basic Metabolic Panel:  Recent Labs Lab 04/25/14 1221 04/26/14 0347  NA 131* 135*  K 3.3* 3.7  CL 91* 103  CO2 28 24  GLUCOSE 162* 157*  BUN 17 12  CREATININE 0.80 0.55  CALCIUM 9.0 8.2*   Liver Function Tests:  Recent Labs Lab 04/25/14 1221  AST 19  ALT 8  ALKPHOS 377*  BILITOT 0.4  PROT 8.8*  ALBUMIN 2.0*    Recent Labs Lab 04/25/14 1221  LIPASE 55   CBC:  Recent Labs Lab 04/25/14 1221 04/26/14 0347  WBC 9.3 5.2  NEUTROABS 4.7  --   HGB  9.3* 7.5*  HCT 25.8* 21.5*  MCV 79.9 79.3  PLT 797* 595*   Scheduled Meds: . amLODipine  10 mg Oral Daily  . ceFEPime  IV  1 g Intravenous Q8H  . furosemide  20 mg Oral Daily  . heparin  5,000 Units Subcutaneous 3 times per day  . hydrALAZINE  50 mg Oral TID  . mirtazapine  15 mg Oral QHS  . pantoprazole  40 mg Oral Daily  . predniSONE  20 mg Oral BID WC  . sulfaSALAzine  1,000 mg Oral BID  . vancomycin  500 mg Intravenous Q8H   Continuous Infusions: . sodium chloride 75 mL/hr at 04/26/14 8938

## 2014-04-26 NOTE — Plan of Care (Signed)
Problem: Phase I Progression Outcomes Goal: Pain controlled with appropriate interventions Outcome: Completed/Met Date Met:  04/26/14

## 2014-04-26 NOTE — Consult Note (Signed)
Name: [name] [name] MRN: [id number] DOB: [birth year]    ADMISSION DATE:  04/25/2014 CONSULTATION DATE:  04/26/14  REFERRING MD :  Dr. Charlies Silvers   CHIEF COMPLAINT:  Cavitary Lesion on CT Chest   BRIEF PATIENT DESCRIPTION: 53 y/o F admitted with 2 wk hx of abd pain, vomiting, wt loss and hypoxia.  Work up positive for incidental finding of cavitary lesions in the lung.  Recent hospitalization in [place] for UC.  PCCM consulted for evaluation.    SIGNIFICANT EVENTS  11/10  Admit with abd pain, wt loss, vomiting, hypotension, hypoxia and incidental finding of cavitary lung lesions.    STUDIES:  11/10  CT Chest >> multi-focal bilateral cavitary lesions with surrounding ground glass, no sig adenopathy, multi-vessel CAD   HISTORY OF PRESENT ILLNESS:  53 y/o F with a PMH of UC, GERD, HTN, Hep C, Substance abuse & HIV (reported new diagnosis during recent [place] admission)  who presented to [facility] ER on 11/9 with a 3 week hx of weakness, non-productive cough, lower abdominal pain, rectal bleeding and decreased appetite.  She reports she has lost approximately 32 lbs in the last month.  The patient was recently admitted to [place] for an ulcerative colitis flare and followed up at [facility] on 11/2 with an NP on 11/3 and was instructed to set up an appointment with the HIV clinic in [facility]  The patient did not follow up with the clinic because of transportation issues.  She previously had been on sulfasalazine which reportedly helped with bloody stools but she quit taking due to nausea / vomiting.  At the 11/3 visit, her home lasix was discontinued due to tachycardia and hypotension.    She currently reports she was diagnosed with HIV but her family is not aware and she is very concerned that they will find out about the diagnosis.  The patient reports she never injected IV drugs.  Her drug of choice was crack and marijuana.  She used to be a fairly heavy crack abuser, infrequent ETOH.  She denies being sexually  active.  She denies smoking cigarettes.  Patient reports current sensation of shortness of breath, dry non-productive cough, intermittent fevers, R posterior chest pain, decreased appetite, nausea/vomiting, occasional swelling of lower extremities.  She denies skin lesions, hemoptysis.  Indicates multiple broken and decaying teeth, has not seen a dentist.  Former exposure (29 years ago) of TB - her father-in-law had TB and was treated during her pregnancy with her daughter.    PAST MEDICAL HISTORY :   has a past medical history of Ulcerative colitis; GERD (gastroesophageal reflux disease); Hypertension; HIV (human immunodeficiency virus infection); Hepatitis C; Substance abuse; and GI bleed.  has past surgical history that includes Abdominal hysterectomy.    HOME MEDICATIONS: Prior to Admission medications   Medication Sig Start Date End Date Taking? Authorizing Provider  amLODipine (NORVASC) 10 MG tablet Take 10 mg by mouth daily.   Yes Historical Provider, MD  hydrALAZINE (APRESOLINE) 50 MG tablet Take 50 mg by mouth 3 (three) times daily.   Yes Historical Provider, MD  hydrochlorothiazide (HYDRODIURIL) 25 MG tablet Take 25 mg by mouth daily.   Yes Historical Provider, MD  mirtazapine (REMERON SOL-TAB) 15 MG disintegrating tablet Take 15 mg by mouth at bedtime.   Yes Historical Provider, MD  pantoprazole (PROTONIX) 40 MG tablet Take 40 mg by mouth 2 (two) times daily.    Yes Historical Provider, MD  sulfaSALAzine (AZULFIDINE) 500 MG tablet Take 1,000 mg by  mouth 2 (two) times daily.   Yes Historical Provider, MD   No Known Allergies  FAMILY HISTORY:  family history is not on file.   SOCIAL HISTORY:  reports that she has never smoked. She has never used smokeless tobacco. She reports that she does not drink alcohol or use illicit drugs.  REVIEW OF SYSTEMS:   Constitutional: Negative for diaphoresis. Positive for intermittent fever, chills, weight loss, malaise/fatigue HENT: Negative for  hearing loss, ear pain, nosebleeds, congestion, sore throat, neck pain, tinnitus and ear discharge.   Eyes: Negative for blurred vision, double vision, photophobia, pain, discharge and redness.  Respiratory: Negative for hemoptysis, sputum production, wheezing and stridor.  Positive for SOB, reduced activity tolerance, dry non-productive cough, posterior chest pain on R Cardiovascular: Negative for chest pain, palpitations, orthopnea, claudication, and PND. Occasional LE swelling, previously on lasix Gastrointestinal: Negative for heartburn, abdominal pain, diarrhea, constipation.  Positive for n/v,  blood in stool.  Genitourinary: Negative for dysuria, urgency, frequency, hematuria and flank pain.  Musculoskeletal: Negative for myalgias, back pain, joint pain and falls.  Skin: Negative for itching and rash.  Neurological: Negative for dizziness, tingling, tremors, sensory change, speech change, focal weakness, seizures, loss of consciousness, weakness and headaches.  Endo/Heme/Allergies: Negative for environmental allergies and polydipsia. Does not bruise/bleed easily.  SUBJECTIVE:   VITAL SIGNS: Temp:  [97.5 F (36.4 C)-99.9 F (37.7 C)] 97.5 F (36.4 C) (11/10 0446) Pulse Rate:  [67-117] 67 (11/10 0446) Resp:  [20-24] 20 (11/10 0446) BP: (90-122)/(44-70) 95/58 mmHg (11/10 0446) SpO2:  [94 %-100 %] 100 % (11/10 0446) Weight:  [105 lb (47.628 kg)] 105 lb (47.628 kg) (11/09 1157)  PHYSICAL EXAMINATION: General:  Cachectic female in NAD  Neuro:  AAOx4, speech clear, MAE  HEENT:  Mm pink/moist, poor dentition, dental carries / broken teeth Cardiovascular:  s1s2 rrr, no m/r/g Lungs:  resp's even/non-labored, lungs bilaterally clear Abdomen:  Flat, soft, bsx4 active  Musculoskeletal:  No acute deformities  Skin:  Warm/dry, no edema    Recent Labs Lab 04/25/14 1221 04/26/14 0347  NA 131* 135*  K 3.3* 3.7  CL 91* 103  CO2 28 24  BUN 17 12  CREATININE 0.80 0.55  GLUCOSE 162*  157*    Recent Labs Lab 04/25/14 1221 04/26/14 0347  HGB 9.3* 7.5*  HCT 25.8* 21.5*  WBC 9.3 5.2  PLT 797* 595*   Dg Chest 2 View  04/25/2014   CLINICAL DATA:  Weakness for 3 weeks.  EXAM: CHEST  2 VIEW  COMPARISON:  None.  FINDINGS: Ill-defined right upper lobe infiltrate with cavitation is noted. Subtle patchy nodular infiltrates in the left mid lung and left lower lobe noted. These findings are consistent with active infectious of infiltrates including granulomas infiltrates. Cavitation in the right upper lobe infiltrate may be related to developing abscess. Cavitary tumor cannot be excluded. Close follow-up chest x-rays to demonstrate clearing suggested. Prominent nipple shadows noted. Cardiomegaly with normal pulmonary vascularity. No pleural effusion or pneumothorax. No acute or focal bony abnormality.  IMPRESSION: 1. Prominent cavitary infiltrate right upper lobe. These findings are most likely infectious. Active granulomatous disease cannot be excluded. Pulmonary abscess cannot be excluded. 2. Patchy infiltrates left mid lung field a left lower lobe consistent with pneumonia. Close follow-up chest x-rays are suggested to demonstrate clearing of these findings. Underlying tumor particularly in the right upper lobe cannot be excluded.   Electronically Signed   By: Marcello Moores  Register   On: 04/25/2014 17:11   Ct Chest  Wo Contrast  04/26/2014   CLINICAL DATA:  Short of breath and 35 lb weight loss.  EXAM: CT CHEST WITHOUT CONTRAST  TECHNIQUE: Multidetector CT imaging of the chest was performed following the standard protocol without IV contrast.  COMPARISON:  12/21/2013  FINDINGS: Mediastinum: The heart size is normal. There is calcified atherosclerotic disease involving the thoracic aorta as well as the LAD, left circumflex and RCA coronary arteries. There is no mediastinal or hilar adenopathy.  Lungs/Pleura: No pleural effusion identified. Multiple bilateral cavitary lesions are identified with  surrounding ground-glass attenuation. The largest is in the right upper lobe an measures 4.4 x 4.2 cm, image 20/ series 5. Smaller non cavitary nodules are scattered throughout both lungs.  Upper Abdomen: No acute findings identified within the upper abdomen.  Musculoskeletal: Review of the visualized bony structures is unremarkable. No aggressive lytic or sclerotic bone lesions.  IMPRESSION: 1. Examination is positive for multi focal, bilateral cavitary lesions with surrounding allowing glass attenuation. In in a patient who is immunocompromised findings are concerning for infection including pulmonary tuberculosis, septic emboli and atypical infections. Certain malignancy such as squamous cell carcinoma may also cavitate. 2. No significant mediastinal or hilar adenopathy. 3. Atherosclerotic disease including multi vessel coronary artery calcification.   Electronically Signed   By: Kerby Moors M.D.   On: 04/26/2014 09:04   Ct Abdomen Pelvis W Contrast  04/25/2014   CLINICAL DATA:  Lower abdominal pain. Cough, weakness. Rectal bleeding. History of ulcerative colitis, hysterectomy, hepatitis-C. History of hypertension and HIV.  EXAM: CT ABDOMEN AND PELVIS WITH CONTRAST  TECHNIQUE: Multidetector CT imaging of the abdomen and pelvis was performed using the standard protocol following bolus administration of intravenous contrast.  CONTRAST:  146m OMNIPAQUE IOHEXOL 300 MG/ML  SOLN  COMPARISON:  None.  FINDINGS: Lower chest: Patchy densities are identified at the lung bases, associated a small cavitary lesions. The heart size is normal.  Upper abdomen: Nonspecific low-attenuation lesion is identified within the posterior right hepatic lobe measuring 7 mm in diameter. A similar nodule is identified in the left hepatic lobe on image 18, also measuring 7 mm in diameter. There is mild periportal edema. No focal abnormality identified within the spleen or pancreas. The adrenal glands have a normal appearance. Kidneys  show symmetric bilateral excretion. Small left renal cyst is 8 mm in diameter. Gallbladder is present.  Gastrointestinal tract: The stomach and small bowel loops are normal in appearance. There is diffuse thickening of the ascending, transverse, descending, and sigmoid colonic wall consistent with colitis.  Pelvis: Urinary bladder has a normal appearance. The uterus is absent. No adnexal mass identified.  Retroperitoneum: There are small retroperitoneal and mesenteric lymph nodes. Periportal lymph nodes appear fluid and somewhat indistinct. The largest left periaortic lymph node measures 1.6 cm in diameter. There is atherosclerotic calcification of the abdominal aorta. No aneurysm.  Abdominal wall: Unremarkable.  Osseous structures: Unremarkable.  IMPRESSION: 1. Numerous cavitary lesions at the lung bases with considerations including inflammatory and infectious processes. Vasculitis can have a similar appearance but is felt to be less likely. Consider further evaluation with chest x-Larsh and/or chest CT. 2. Significant wall thickening of the entire colon, most notable in the distal aspect, favored to be infectious. Inflammatory causes could have a similar appearance. 3. Periportal and retroperitoneal adenopathy. 4. No abscess or bowel obstruction.   Electronically Signed   By: BShon HaleM.D.   On: 04/25/2014 16:19    ASSESSMENT / PLAN:   Bilateral Cavitary Lesions -  ddx includes chronic microaspiration of oral flora with poor dentition, TB, aspiration and opportunistic infection  Former Heavy Crack Abuse (smoking) Severe Protein Calorie Malnutrition  Hep C+  Anemia  HIV (reported) with low CD4- 140  Plan: Obtain sputum sample for AFB Could not find HIV status in system despite documentation, assess HIV, CD4 Assess ECHO to r/o vegetation  Follow blood cultures Will defer abx to ID & further HIV work up  Consider FOB for specimen evaluation, will wait for ID input.   Trend CXR Follow up  quantiferon gold  PRBC's per primary SVC Ensure TID   Noe Gens, NP-C [facility] Pgr: [phone] or [phone]  ATTENDING NOTE: I have personally reviewed patient's available data, including medical history, events of note, physical examination and test results as part of my evaluation. I have discussed with resident/NP and other careteam providers such as pharmacist, RN and RRT & co-ordinated with consultants. In addition, I personally evaluated patient and elicited key history of weakness & cough & 30 lb wt loss over 1 month, exam findings of pallor, clear lungs, no clubbing, por dentition & labs showing anemia, CD4 140. DD includes infectious causes - MRSA (denies IVDU), lung abscess -no obvious aspiration episode, less likely TB.  The various options of biopsy including bronchoscopy, CT guided needle aspiration and surgical biopsy were discussed.The risks of each procedure including coughing, bleeding and the  chances of lung puncture requiring chest tube were discussed in great detail. The benefits & alternatives including serial follow up were also discussed. Will plan for bronchoscopy in am   Rest per NP/medical resident whose note is outlined above and that I agree with and edited in full.    Kara Mead MD. Shade Flood. [facility] Pager [phone] If no response call [phone]    04/26/2014, 9:56 AM

## 2014-04-26 NOTE — Care Management Note (Signed)
CARE MANAGEMENT NOTE 04/26/2014  Patient:  [name] [name], [name] [name]   Account Number:  [id number]  Date Initiated:  04/26/2014  Documentation initiated by:  Marney Doctor  Subjective/Objective Assessment:   53 yo admitted with Ulcerative Colitis. Hx of GERD (gastroesophageal reflux disease) Hypertension     . HIV (human immunodeficiency virus infection)     . Hepatitis C     . Substance abuse     . GI bleed       Action/Plan:   From home with daughter   Anticipated DC Date:  05/02/2014   Anticipated DC Plan:  [place]  CM consult  [facility]      Choice offered to / List presented to:             Status of service:  In process, will continue to follow Medicare Important Message given?   (If response is "NO", the following Medicare IM given date fields will be blank) Date Medicare IM given:   Medicare IM given by:   Date Additional Medicare IM given:   Additional Medicare IM given by:    Discharge Disposition:    Per UR Regulation:  Reviewed for med. necessity/level of care/duration of stay  If discussed at [place] of Stay Meetings, dates discussed:    Comments:  04/26/14 Marney Doctor RN,BSN,NCM [phone] CM consult for medication needs.  Pt states she does have a PCP but would prefer someone else.  Pt given [employer] information and informed that if she makes a F/U appointment at the [facility] that she could take her DC prescriptions from the hospital straight to [facility] and get help with her meds.  She states she will do this.  She states she has signed up for Medicaid.  Not other CM needs at this time.  CM will continue to follow.

## 2014-04-26 NOTE — Plan of Care (Signed)
Problem: Phase I Progression Outcomes Goal: Pain controlled with appropriate interventions Outcome: Progressing Goal: OOB as tolerated unless otherwise ordered Outcome: Completed/Met Date Met:  04/26/14 Goal: Initial discharge plan identified Outcome: Progressing Goal: Voiding-avoid urinary catheter unless indicated Outcome: Completed/Met Date Met:  04/26/14 Goal: Hemodynamically stable Outcome: Progressing

## 2014-04-26 NOTE — Progress Notes (Signed)
INITIAL NUTRITION ASSESSMENT  DOCUMENTATION CODES Per approved criteria  -Non-severe (moderate) malnutrition in the context of chronic illness -underweight   INTERVENTION: Clear liquid with diet advancement per MD Resource Breeze tid Change Resource to Ensure when diet advanced RD to follow.  NUTRITION DIAGNOSIS: Inadequate oral intake related to needs as evidenced by diet recall and clear liquid diet currently..   Goal: Intake of meals and supplements to meet >90% estimated needs.  Monitor:  Intake, labs, weight trend  Reason for Assessment: Consult and MST, low BMI  53 y.o. female  Admitting Dx: Ulcerative colitis, acute  ASSESSMENT: Patient admitted with ulcerative colitis, cavity lung lesion, HCAP, h/o drug use.  Recent HIV dx.  Tolerating Clear liquid diet and crackers well. Ate regular diet with overall good intake per patient. Drank Ensure at home. UBW 136 lbs 3 weeks ago per patient. 77% UBW with 31 lb weight loss int the past 3 weeks per patient. Obvious decreased body fat and depletion of muscle mass.  Height: Ht Readings from Last 1 Encounters:  04/25/14 5' 8"  (1.727 m)    Weight: Wt Readings from Last 1 Encounters:  04/25/14 105 lb (47.628 kg)    Ideal Body Weight: 140 lbs  % Ideal Body Weight: 75  Wt Readings from Last 10 Encounters:  04/25/14 105 lb (47.628 kg)    Usual Body Weight: 136  % Usual Body Weight: 78  BMI:  Body mass index is 15.97 kg/(m^2).  Estimated Nutritional Needs: Kcal: 1500-1700 Protein: 80-90 gm  Fluid: >1.3L daily  Skin: intact  Diet Order: Diet clear liquid Diet NPO time specified  EDUCATION NEEDS: -Education needs addressed   Intake/Output Summary (Last 24 hours) at 04/26/14 1454 Last data filed at 04/26/14 1158  Gross per 24 hour  Intake 1173.33 ml  Output      0 ml  Net 1173.33 ml     Labs:   Recent Labs Lab 04/25/14 1221 04/26/14 0347  NA 131* 135*  K 3.3* 3.7  CL 91* 103  CO2 28 24   BUN 17 12  CREATININE 0.80 0.55  CALCIUM 9.0 8.2*  GLUCOSE 162* 157*    CBG (last 3)  No results for input(s): GLUCAP in the last 72 hours.  Scheduled Meds: . sodium chloride   Intravenous Once  . feeding supplement (ENSURE)  1 Container Oral TID BM  . heparin  5,000 Units Subcutaneous 3 times per day  . mirtazapine  15 mg Oral QHS  . pantoprazole  40 mg Oral Daily  . predniSONE  20 mg Oral BID WC  . sulfaSALAzine  1,000 mg Oral BID    Continuous Infusions: . sodium chloride 75 mL/hr at 04/26/14 6389    Past Medical History  Diagnosis Date  . Ulcerative colitis   . GERD (gastroesophageal reflux disease)   . Hypertension   . HIV (human immunodeficiency virus infection)   . Hepatitis C   . Substance abuse   . GI bleed     Past Surgical History  Procedure Laterality Date  . Abdominal hysterectomy      Antonieta Iba, RD, LDN Clinical Inpatient Dietitian Pager:  [phone] Weekend and after hours pager:  [phone]

## 2014-04-26 NOTE — Progress Notes (Signed)
  Echocardiogram 2D Echocardiogram has been performed.  [name] [name] M 04/26/2014, 2:39 PM

## 2014-04-27 ENCOUNTER — Inpatient Hospital Stay (HOSPITAL_COMMUNITY): Payer: Medicaid Other

## 2014-04-27 ENCOUNTER — Encounter (HOSPITAL_COMMUNITY)
Admission: EM | Disposition: A | Discharge status: Home / Self Care | Payer: Self-pay | Source: Home / Self Care | Attending: Internal Medicine

## 2014-04-27 DIAGNOSIS — I959 Hypotension, unspecified: Secondary | ICD-10-CM

## 2014-04-27 DIAGNOSIS — E781 Pure hyperglyceridemia: Secondary | ICD-10-CM

## 2014-04-27 DIAGNOSIS — E876 Hypokalemia: Secondary | ICD-10-CM

## 2014-04-27 DIAGNOSIS — D473 Essential (hemorrhagic) thrombocythemia: Secondary | ICD-10-CM

## 2014-04-27 DIAGNOSIS — E43 Unspecified severe protein-calorie malnutrition: Secondary | ICD-10-CM

## 2014-04-27 HISTORY — PX: VIDEO BRONCHOSCOPY: SHX5072

## 2014-04-27 LAB — TYPE AND SCREEN
ABO/RH(D): B POS
ANTIBODY SCREEN: NEGATIVE
Unit division: 0

## 2014-04-27 LAB — BASIC METABOLIC PANEL
Anion gap: 8 (ref 5–15)
BUN: 11 mg/dL (ref 6–23)
CALCIUM: 8.4 mg/dL (ref 8.4–10.5)
CO2: 22 meq/L (ref 19–32)
CREATININE: 0.49 mg/dL — AB (ref 0.50–1.10)
Chloride: 106 mEq/L (ref 96–112)
GFR calc Af Amer: >90 mL/min (ref >90)
GFR calc non Af Amer: >90 mL/min (ref >90)
GLUCOSE: 174 mg/dL — AB (ref 70–99)
Potassium: 3.2 mEq/L — ABNORMAL LOW (ref 3.7–5.3)
Sodium: 136 mEq/L — ABNORMAL LOW (ref 137–147)

## 2014-04-27 LAB — CBC
HEMATOCRIT: 23.4 % — AB (ref 36.0–46.0)
HEMOGLOBIN: 8.1 g/dL — AB (ref 12.0–15.0)
MCH: 28 pg (ref 26.0–34.0)
MCHC: 34.6 g/dL (ref 30.0–36.0)
MCV: 81 fL (ref 78.0–100.0)
Platelets: 590 10*3/uL — ABNORMAL HIGH (ref 150–400)
RBC: 2.89 MIL/uL — AB (ref 3.87–5.11)
RDW: 14.1 % (ref 11.5–15.5)
WBC: 4.4 10*3/uL (ref 4.0–10.5)

## 2014-04-27 LAB — QUANTIFERON TB GOLD ASSAY (BLOOD)
Mitogen value: 0.12 IU/mL
Quantiferon Nil Value: 0.04 IU/mL
TB Ag value: 0.04 IU/mL
TB Antigen Minus Nil Value: 0 IU/mL

## 2014-04-27 SURGERY — BRONCHOSCOPY, WITH FLUOROSCOPY
Anesthesia: Moderate Sedation | Laterality: Bilateral

## 2014-04-27 MED ORDER — PHENYLEPHRINE HCL 0.25 % NA SOLN
1.0000 | Freq: Four times a day (QID) | NASAL | Status: DC | PRN
  Administered 2014-04-28: 1 via NASAL
  Filled 2014-04-27: qty 15

## 2014-04-27 MED ORDER — FENTANYL CITRATE 0.05 MG/ML IJ SOLN
INTRAMUSCULAR | Status: AC
  Filled 2014-04-27: qty 4

## 2014-04-27 MED ORDER — ENSURE COMPLETE PO LIQD
237.0000 mL | Freq: Three times a day (TID) | ORAL | Status: DC
  Administered 2014-04-27 – 2014-05-03 (×14): 237 mL via ORAL

## 2014-04-27 MED ORDER — LIDOCAINE HCL 1 % IJ SOLN
INTRAMUSCULAR | Status: DC | PRN
  Administered 2014-04-27: 6 mL via RESPIRATORY_TRACT

## 2014-04-27 MED ORDER — PHENYLEPHRINE HCL 0.25 % NA SOLN
NASAL | Status: DC | PRN
  Administered 2014-04-27: 2 via NASAL

## 2014-04-27 MED ORDER — MIDAZOLAM HCL 10 MG/2ML IJ SOLN
INTRAMUSCULAR | Status: DC | PRN
  Administered 2014-04-27 (×2): 1 mg via INTRAVENOUS
  Administered 2014-04-27 (×2): .5 mg via INTRAVENOUS

## 2014-04-27 MED ORDER — SODIUM CHLORIDE 0.9 % IV SOLN: INTRAVENOUS | Status: DC

## 2014-04-27 MED ORDER — POTASSIUM CHLORIDE CRYS ER 20 MEQ PO TBCR
40.0000 meq | EXTENDED_RELEASE_TABLET | Freq: Once | ORAL | Status: AC
  Administered 2014-04-27: 40 meq via ORAL
  Filled 2014-04-27: qty 2

## 2014-04-27 MED ORDER — HYDROCORTISONE 2.5 % RE CREA
TOPICAL_CREAM | Freq: Three times a day (TID) | RECTAL | Status: DC | PRN
  Filled 2014-04-27: qty 28.35

## 2014-04-27 MED ORDER — LIDOCAINE HCL 2 % EX GEL
CUTANEOUS | Status: DC | PRN
  Administered 2014-04-27: 1

## 2014-04-27 MED ORDER — SULFAMETHOXAZOLE-TRIMETHOPRIM 800-160 MG PO TABS
1.0000 | ORAL_TABLET | Freq: Every day | ORAL | Status: DC
  Administered 2014-04-27 – 2014-05-03 (×7): 1 via ORAL
  Filled 2014-04-27 (×7): qty 1

## 2014-04-27 MED ORDER — MIDAZOLAM HCL 10 MG/2ML IJ SOLN
INTRAMUSCULAR | Status: AC
  Filled 2014-04-27: qty 4

## 2014-04-27 MED ORDER — BUTAMBEN-TETRACAINE-BENZOCAINE 2-2-14 % EX AERO
1.0000 | INHALATION_SPRAY | Freq: Once | CUTANEOUS | Status: DC
  Filled 2014-04-27: qty 56

## 2014-04-27 MED ORDER — FENTANYL CITRATE 0.05 MG/ML IJ SOLN
INTRAMUSCULAR | Status: DC | PRN
  Administered 2014-04-27 (×3): 25 ug via INTRAVENOUS

## 2014-04-27 MED ORDER — LIDOCAINE HCL 2 % EX GEL
1.0000 "application " | Freq: Once | CUTANEOUS | Status: DC
  Filled 2014-04-27: qty 5

## 2014-04-27 NOTE — Plan of Care (Signed)
Problem: Phase III Progression Outcomes Goal: Voiding independently Outcome: Completed/Met Date Met:  04/27/14

## 2014-04-27 NOTE — Op Note (Signed)
Indication : RUL cavitary lesion in this 53 yo smoker, HIV pos Written informed consent was obtained prior to the procedure. The risks of the procedure including coughing, bleeding and the small chance of lung puncture requiring chest tube were discussed in great detail. The benefits & alternatives including serial follow up were also discussed.  3 mg versed & 75 mcg fentnayl used in divided doses. Bronchoscope entered from the right nare. Upper airway nml Vocal cords showed nml appearance & motion.  Trachea & bronchial tree examined to the subsegmental level. Thick white-yellow sputum suctioned out from trachea & airways. Brushings & transbronchial biopsies x 3 were obtained from RUL cavitary lesion under fluoroscopy. She tolerated procedure well with minimal bleeding & was awake at the end of the procedure. CXR will be performed Specimens sent for afb culture & cytology besides routine testing  Rigoberto Noel. MD

## 2014-04-27 NOTE — Progress Notes (Addendum)
Patient ID: [name] [name], female   DOB: 1961/04/06, 53 y.o.   MRN: [id number]  [facility] PROGRESS NOTE  [name] [name] FIE:[id number] DOB: 1961-04-06 DOA: 04/25/2014 PCP: No primary care provider on file.  Brief narrative: 53 y.o. female with known history of IVDU, reently diagnosed with HIV (not seen by ID specialist and not started on HAART), who presented with 2 week history of lower abdominal pain, vomiting, weight loss, and diminished appetite, BRBPR. Recently hospitalized at [facility] for UC flare where she was treated with solumedrol and was discharged with script for Prednisone and Sulfasalazine but pt explains she was unable to get it.   Work up in the ED revealed cavitary lesions in  lower lobes on CT abd/pelvis, a CXR also notable for cavitary lesion in the RUL, as well as findings of PNA in left lower lobe. There is no mention of cavitary lesions on her [place] H&P nor discharge summary and patient reports that she did not have a CXR during that admit. Patient underwent bronchoscopy 04/27/2014 with brushings & transbronchial biopsies x 3 were obtained from RUL cavitary lesion.  Assessment/Plan:    Principal Problem:  Acute hypoxic respiratory failure / HCAP - likely multifactorial, multi focal, bilateral cavitary lesions concerning for infection including pulmonary tuberculosis, septic emboli and atypical infections - pt started on vancomycin and zosyn to cover for HCAP (given recent hospitalization and immunocompromised state) but per ID we held it pending BAL results - pt underwent bronchoscopy 04/27/2014 with brushings & transbronchial biopsies x 3 were obtained from RUL cavitary lesion (follow up on  AFB, bacterial, fungal, PCP, cytology) - started on bactrim daily and prednisone daily  - continue to provide oxygen supportive [place] to keep O2 saturation above 90%  Active Problems:  Hypotension - BP meds on hold due to soft BP  Anemia of chronic disease, likely  HIV - drop in hg since admission possibly dilutional from IVF pt has been receiving since admission - no signs of active bleeding, transfused one unit of blood (11/10) - posttransfusion Hgb 8.1   Thrombocytosis - possibly secondary to an inflammatory process - Plt trending down   Hyponatremia - likely pre renal from poor oral intake - Na trending up after receiving IVF: 131 --> 136  Hypokalemia  - likely from lasix   -continue to supplement - obtain magnesium level   Abdominal pain - from underlying UC imposed on new infection as noted above - currently on prednisone and sulfasalazine   HIV positive - ID consulted, appreciate assistance - CD4 140; hold off on ARV until BAL results   Severe PCM - likely from underlying infection - advance diet when pt able to tolerate   DVT Prophylaxis: Heparin SQ  Code Status: Full.  Family Communication: plan of care discussed with the patient Disposition Plan: Home when stable.     IV access:   PeripheralIV  Procedures and diagnostic studies:     Dg Chest 2 View 04/25/2014 Prominent cavitary infiltrate right upper lobe. These findings are most likely infectious. Active granulomatous disease cannot be excluded. Pulmonary abscess cannot be excluded. Patchy infiltrates left mid lung field a left lower lobe consistent with pneumonia. Close follow-up chest x-rays are suggested to demonstrate clearing of these findings. Underlying tumor particularly in the right upper lobe cannot be excluded.   Ct Chest Wo Contrast 04/26/2014 Examination is positive for multi focal, bilateral cavitary lesions with surrounding allowing glass attenuation. In a patient who is immunocompromised findings are concerning for  infection including pulmonary tuberculosis, septic emboli and atypical infections. Certain malignancy such as squamous cell carcinoma may also cavitate. No significant mediastinal or hilar adenopathy. Atherosclerotic disease  including multi vessel coronary artery calcification.   Ct Abdomen Pelvis W Contrast 04/25/2014 Numerous cavitary lesions at the lung bases with considerations including inflammatory and infectious processes. Vasculitis can have a similar appearance but is felt to be less likely. Significant wall thickening of the entire colon, most notable in the distal aspect, favored to be infectious. Inflammatory causes could have a similar appearance. Periportal and retroperitoneal adenopathy. No abscess or bowel obstruction.  Bronchoscopy 04/27/2014 Brushings & transbronchial biopsies x 3 were obtained from RUL cavitary lesion   Medical Consultants:   Infectious disease (Dr. Dominga Ferry COmer)  Pulmonary    Other Consultants:   Nutrition   IAnti-Infectives:    Vancomycin 11/09 -->   Maxipime 11/09 -->   Leisa Lenz, MD  [facility] Pager [phone]  If 7PM-7AM, please contact night-coverage [email or web address] Password [facility] 04/27/2014, 6:57 AM   LOS: 2 days    HPI/Subjective: No acute overnight events.  Objective: Filed Vitals:   04/26/14 1438 04/26/14 1520 04/26/14 2130 04/27/14 0450  BP: 110/65 100/55 105/64 105/63  Pulse: 72 76 70 65  Temp: 97.6 F (36.4 C) 97.8 F (36.6 C) 97.9 F (36.6 C) 98 F (36.7 C)  TempSrc: Oral Oral Oral Oral  Resp: 22 20 20 20   Height:      Weight:      SpO2:  100% 100% 100%    Intake/Output Summary (Last 24 hours) at 04/27/14 0657 Last data filed at 04/27/14 0600  Gross per 24 hour  Intake   2730 ml  Output      0 ml  Net   2730 ml    Exam:   General:  Pt is alert, follows commands appropriately, not in acute distress, cachectic   Cardiovascular: Regular rate and rhythm, S1/S2 appreciated   Respiratory: no wheezing, no crackles, no rhonchi  Abdomen: Soft, non tender, non distended, bowel sounds present  Extremities: No edema, pulses DP and PT palpable bilaterally  Neuro: Grossly nonfocal  Data Reviewed: Basic  Metabolic Panel:  Recent Labs Lab 04/25/14 1221 04/26/14 0347 04/27/14 0408  NA 131* 135* 136*  K 3.3* 3.7 3.2*  CL 91* 103 106  CO2 28 24 22   GLUCOSE 162* 157* 174*  BUN 17 12 11   CREATININE 0.80 0.55 0.49*  CALCIUM 9.0 8.2* 8.4   Liver Function Tests:  Recent Labs Lab 04/25/14 1221  AST 19  ALT 8  ALKPHOS 377*  BILITOT 0.4  PROT 8.8*  ALBUMIN 2.0*    Recent Labs Lab 04/25/14 1221  LIPASE 55   No results for input(s): AMMONIA in the last 168 hours. CBC:  Recent Labs Lab 04/25/14 1221 04/26/14 0347 04/27/14 0408  WBC 9.3 5.2 4.4  NEUTROABS 4.7  --   --   HGB 9.3* 7.5* 8.1*  HCT 25.8* 21.5* 23.4*  MCV 79.9 79.3 81.0  PLT 797* 595* 590*   Cardiac Enzymes: No results for input(s): CKTOTAL, CKMB, CKMBINDEX, TROPONINI in the last 168 hours. BNP: Invalid input(s): POCBNP CBG: No results for input(s): GLUCAP in the last 168 hours.  Recent Results (from the past 240 hour(s))  Culture, expectorated sputum-assessment     Status: None   Collection Time: 04/26/14  5:11 PM  Result Value Ref Range Status   Specimen Description SPUTUM  Final   Special Requests Immunocompromised  Final   Sputum  evaluation   Final    THIS SPECIMEN IS ACCEPTABLE. RESPIRATORY CULTURE REPORT TO FOLLOW.   Report Status 04/26/2014 FINAL  Final     Scheduled Meds: . feeding supplement (ENSURE)  1 Container Oral TID BM  . feeding supplement (RESOURCE BREEZE)  1 Container Oral TID BM  . heparin  5,000 Units Subcutaneous 3 times per day  . mirtazapine  15 mg Oral QHS  . pantoprazole  40 mg Oral Daily  . predniSONE  20 mg Oral BID WC  . sulfaSALAzine  1,000 mg Oral BID   Continuous Infusions: . sodium chloride 75 mL/hr at 04/26/14 8902

## 2014-04-27 NOTE — Progress Notes (Signed)
[facility] for Infectious Disease  Date of Admission:  04/25/2014  Antibiotics: none  Subjective: Feels ok, no sob, eating well  Objective: Temp:  [97.8 F (36.6 C)-98.1 F (36.7 C)] 98.1 F (36.7 C) (11/11 1320) Pulse Rate:  [60-123] 91 (11/11 1320) Resp:  [13-24] 20 (11/11 1320) BP: (100-168)/(55-100) 121/63 mmHg (11/11 1320) SpO2:  [99 %-100 %] 100 % (11/11 1320)  General: awake, in bed Skin: no rashes Lungs: CTA B Cor: RRR Abdomen: soft, nt, nd Ext: no edema  Lab Results Lab Results  Component Value Date   WBC 4.4 04/27/2014   HGB 8.1* 04/27/2014   HCT 23.4* 04/27/2014   MCV 81.0 04/27/2014   PLT 590* 04/27/2014    Lab Results  Component Value Date   CREATININE 0.49* 04/27/2014   BUN 11 04/27/2014   NA 136* 04/27/2014   K 3.2* 04/27/2014   CL 106 04/27/2014   CO2 22 04/27/2014    Lab Results  Component Value Date   ALT 8 04/25/2014   AST 19 04/25/2014   ALKPHOS 377* 04/25/2014   BILITOT 0.4 04/25/2014      Microbiology: Recent Results (from the past 240 hour(s))  Culture, blood (routine x 2)     Status: None (Preliminary result)   Collection Time: 04/25/14  7:38 PM  Result Value Ref Range Status   Specimen Description BLOOD RIGHT ARM  Final   Special Requests BOTTLES DRAWN AEROBIC AND ANAEROBIC 10CC  Final   Culture  Setup Time   Final    04/26/2014 01:22 Performed at [facility]    Culture   Final           BLOOD CULTURE RECEIVED NO GROWTH TO DATE CULTURE WILL BE HELD FOR 5 DAYS BEFORE ISSUING A FINAL NEGATIVE REPORT Performed at [facility]    Report Status PENDING  Incomplete  Culture, blood (routine x 2)     Status: None (Preliminary result)   Collection Time: 04/25/14  7:48 PM  Result Value Ref Range Status   Specimen Description BLOOD RIGHT HAND  Final   Special Requests BOTTLES DRAWN AEROBIC AND ANAEROBIC 10CC  Final   Culture  Setup Time   Final    04/26/2014 01:22 Performed at [facility]    Culture   Final           BLOOD CULTURE RECEIVED NO GROWTH TO DATE CULTURE WILL BE HELD FOR 5 DAYS BEFORE ISSUING A FINAL NEGATIVE REPORT Performed at [facility]    Report Status PENDING  Incomplete  AFB culture with smear     Status: None (Preliminary result)   Collection Time: 04/26/14  5:10 PM  Result Value Ref Range Status   Specimen Description SPUTUM  Final   Special Requests Immunocompromised  Final   Acid Fast Smear   Final    NO ACID FAST BACILLI SEEN Performed at [facility]    Culture   Final    CULTURE WILL BE EXAMINED FOR 6 WEEKS BEFORE ISSUING A FINAL REPORT Performed at [facility]    Report Status PENDING  Incomplete  Culture, expectorated sputum-assessment     Status: None   Collection Time: 04/26/14  5:11 PM  Result Value Ref Range Status   Specimen Description SPUTUM  Final   Special Requests Immunocompromised  Final   Sputum evaluation   Final    THIS SPECIMEN IS ACCEPTABLE. RESPIRATORY CULTURE REPORT TO FOLLOW.   Report Status 04/26/2014 FINAL  Final  Culture, respiratory (NON-Expectorated)     Status: None (Preliminary result)   Collection Time: 04/26/14  5:11 PM  Result Value Ref Range Status   Specimen Description SPUTUM  Final   Special Requests NONE  Final   Gram Stain   Final    NO WBC SEEN RARE SQUAMOUS EPITHELIAL CELLS PRESENT RARE GRAM POSITIVE COCCI IN PAIRS RARE GRAM NEGATIVE RODS RARE GRAM NEGATIVE COCCI    Culture   Final    NORMAL OROPHARYNGEAL FLORA Performed at [facility]    Report Status PENDING  Incomplete    Studies/Results: Dg Chest 2 View  04/25/2014   CLINICAL DATA:  Weakness for 3 weeks.  EXAM: CHEST  2 VIEW  COMPARISON:  None.  FINDINGS: Ill-defined right upper lobe infiltrate with cavitation is noted. Subtle patchy nodular infiltrates in the left mid lung and left lower lobe noted. These findings are consistent with active infectious of infiltrates including granulomas infiltrates.  Cavitation in the right upper lobe infiltrate may be related to developing abscess. Cavitary tumor cannot be excluded. Close follow-up chest x-rays to demonstrate clearing suggested. Prominent nipple shadows noted. Cardiomegaly with normal pulmonary vascularity. No pleural effusion or pneumothorax. No acute or focal bony abnormality.  IMPRESSION: 1. Prominent cavitary infiltrate right upper lobe. These findings are most likely infectious. Active granulomatous disease cannot be excluded. Pulmonary abscess cannot be excluded. 2. Patchy infiltrates left mid lung field a left lower lobe consistent with pneumonia. Close follow-up chest x-rays are suggested to demonstrate clearing of these findings. Underlying tumor particularly in the right upper lobe cannot be excluded.   Electronically Signed   By: Marcello Moores  Register   On: 04/25/2014 17:11   Ct Chest Wo Contrast  04/26/2014   CLINICAL DATA:  Short of breath and 35 lb weight loss.  EXAM: CT CHEST WITHOUT CONTRAST  TECHNIQUE: Multidetector CT imaging of the chest was performed following the standard protocol without IV contrast.  COMPARISON:  12/21/2013  FINDINGS: Mediastinum: The heart size is normal. There is calcified atherosclerotic disease involving the thoracic aorta as well as the LAD, left circumflex and RCA coronary arteries. There is no mediastinal or hilar adenopathy.  Lungs/Pleura: No pleural effusion identified. Multiple bilateral cavitary lesions are identified with surrounding ground-glass attenuation. The largest is in the right upper lobe an measures 4.4 x 4.2 cm, image 20/ series 5. Smaller non cavitary nodules are scattered throughout both lungs.  Upper Abdomen: No acute findings identified within the upper abdomen.  Musculoskeletal: Review of the visualized bony structures is unremarkable. No aggressive lytic or sclerotic bone lesions.  IMPRESSION: 1. Examination is positive for multi focal, bilateral cavitary lesions with surrounding allowing glass  attenuation. In in a patient who is immunocompromised findings are concerning for infection including pulmonary tuberculosis, septic emboli and atypical infections. Certain malignancy such as squamous cell carcinoma may also cavitate. 2. No significant mediastinal or hilar adenopathy. 3. Atherosclerotic disease including multi vessel coronary artery calcification.   Electronically Signed   By: Kerby Moors M.D.   On: 04/26/2014 09:04   Ct Abdomen Pelvis W Contrast  04/25/2014   CLINICAL DATA:  Lower abdominal pain. Cough, weakness. Rectal bleeding. History of ulcerative colitis, hysterectomy, hepatitis-C. History of hypertension and HIV.  EXAM: CT ABDOMEN AND PELVIS WITH CONTRAST  TECHNIQUE: Multidetector CT imaging of the abdomen and pelvis was performed using the standard protocol following bolus administration of intravenous contrast.  CONTRAST:  152m OMNIPAQUE IOHEXOL 300 MG/ML  SOLN  COMPARISON:  None.  FINDINGS: Lower chest: Patchy densities are identified at the lung bases, associated a small cavitary lesions. The heart size is normal.  Upper abdomen: Nonspecific low-attenuation lesion is identified within the posterior right hepatic lobe measuring 7 mm in diameter. A similar nodule is identified in the left hepatic lobe on image 18, also measuring 7 mm in diameter. There is mild periportal edema. No focal abnormality identified within the spleen or pancreas. The adrenal glands have a normal appearance. Kidneys show symmetric bilateral excretion. Small left renal cyst is 8 mm in diameter. Gallbladder is present.  Gastrointestinal tract: The stomach and small bowel loops are normal in appearance. There is diffuse thickening of the ascending, transverse, descending, and sigmoid colonic wall consistent with colitis.  Pelvis: Urinary bladder has a normal appearance. The uterus is absent. No adnexal mass identified.  Retroperitoneum: There are small retroperitoneal and mesenteric lymph nodes. Periportal  lymph nodes appear fluid and somewhat indistinct. The largest left periaortic lymph node measures 1.6 cm in diameter. There is atherosclerotic calcification of the abdominal aorta. No aneurysm.  Abdominal wall: Unremarkable.  Osseous structures: Unremarkable.  IMPRESSION: 1. Numerous cavitary lesions at the lung bases with considerations including inflammatory and infectious processes. Vasculitis can have a similar appearance but is felt to be less likely. Consider further evaluation with chest x-Bergin and/or chest CT. 2. Significant wall thickening of the entire colon, most notable in the distal aspect, favored to be infectious. Inflammatory causes could have a similar appearance. 3. Periportal and retroperitoneal adenopathy. 4. No abscess or bowel obstruction.   Electronically Signed   By: Shon Hale M.D.   On: 04/25/2014 16:19   Dg Chest Port 1 View  04/27/2014   CLINICAL DATA:  Post bronchoscopy and RIGHT upper lobe biopsy  EXAM: PORTABLE CHEST - 1 VIEW  COMPARISON:  Portable exam 0941 hr compared to CT chest 04/26/2014  FINDINGS: Borderline enlargement of cardiac silhouette.  Mediastinal contours and pulmonary vascularity normal.  Cavitary opacity in RIGHT upper lobe again identified corresponding to cavitary lesion on CT.  Scattered RIGHT upper lobe and LEFT perihilar infiltrates present.  No pneumothorax post bronchoscopy.  Remaining lungs clear.  No pleural effusion.  IMPRESSION: No pneumothorax post bronchoscopy.  Persisting cavitary lesion in the RIGHT upper lobe and mild scattered infiltrates, favor infectious.   Electronically Signed   By: Lavonia Dana M.D.   On: 04/27/2014 09:58    Assessment/Plan: 1) cavitary lesions - unknown at this time.  Echo without obvious vegetations, though certainly could still be septic emboli but blood cultures remain negative.  Awaiting for cultures from bronchoscopy and if AFB from that is smear negative, can d/c airborne isolation.   I have also added Tb PCR.     Still will hold antibiotics pending an ID of cause  2) HIV/AIDS - will start Bactrim prophylaxis, holding on ARVs pending #1.    Scharlene Gloss, [facility] [place] [email or web address] [phone] pager   [phone] cell 04/27/2014, 3:19 PM

## 2014-04-27 NOTE — Progress Notes (Signed)
Name: [name] [name] MRN: [id number] DOB: 15-Apr-1961    ADMISSION DATE:  04/25/2014 CONSULTATION DATE:  04/26/14  REFERRING MD :  Dr. Charlies Silvers   CHIEF COMPLAINT:  Cavitary Lesion on CT Chest     HISTORY OF PRESENT ILLNESS:  53 y/o F with a PMH of UC, GERD, HTN, Hep C, Substance abuse & HIV (reported new diagnosis during recent [facility] admission)  who presented to [facility] ER on 11/9 with a 3 week hx of weakness, non-productive cough, lower abdominal pain, rectal bleeding and decreased appetite.  She reports she has lost approximately 32 lbs in the last month.  The patient was recently admitted to [place] for an ulcerative colitis flare and followed up at [facility] on 11/2 with an NP on 11/3 and was instructed to set up an appointment with the HIV clinic in [facility].  The patient did not follow up with the clinic because of transportation issues.  She previously had been on sulfasalazine which reportedly helped with bloody stools but she quit taking due to nausea / vomiting.  At the 11/3 visit, her home lasix was discontinued due to tachycardia and hypotension.    She currently reports she was diagnosed with HIV but her family is not aware and she is very concerned that they will find out about the diagnosis.  The patient reports she never injected IV drugs.  Her drug of choice was crack and marijuana.  She used to be a fairly heavy crack abuser, infrequent ETOH.  She denies being sexually active.  She denies smoking cigarettes.  Patient reports current sensation of shortness of breath, dry non-productive cough, intermittent fevers, R posterior chest pain, decreased appetite, nausea/vomiting, occasional swelling of lower extremities.  She denies skin lesions, hemoptysis.  Indicates multiple broken and decaying teeth, has not seen a dentist.  Former exposure (29 years ago) of TB - her father-in-law had TB and was treated during her pregnancy with her daughter.    SIGNIFICANT EVENTS  11/10  Admit with abd pain,  wt loss, vomiting, hypotension, hypoxia and incidental finding of cavitary lung lesions.    STUDIES:  11/10  CT Chest >> multi-focal bilateral cavitary lesions with surrounding ground glass, no sig adenopathy, multi-vessel CAD   SUBJECTIVE: afebrile Denies CP Coughing all  night  VITAL SIGNS: Temp:  [97.6 F (36.4 C)-98 F (36.7 C)] 98 F (36.7 C) (11/11 0450) Pulse Rate:  [60-123] 89 (11/11 0918) Resp:  [13-22] 19 (11/11 0918) BP: (100-168)/(55-100) 146/100 mmHg (11/11 0918) SpO2:  [100 %] 100 % (11/11 0918)  PHYSICAL EXAMINATION: General:  Cachectic female in NAD  Neuro:  AAOx4, speech clear, MAE  HEENT:  Mm pink/moist, poor dentition, dental carries / broken teeth Cardiovascular:  s1s2 rrr, no m/r/g Lungs:  resp's even/non-labored, lungs bilaterally clear Abdomen:  Flat, soft, bsx4 active  Musculoskeletal:  No acute deformities  Skin:  Warm/dry, no edema    Recent Labs Lab 04/25/14 1221 04/26/14 0347 04/27/14 0408  NA 131* 135* 136*  K 3.3* 3.7 3.2*  CL 91* 103 106  CO2 28 24 22   BUN 17 12 11   CREATININE 0.80 0.55 0.49*  GLUCOSE 162* 157* 174*    Recent Labs Lab 04/25/14 1221 04/26/14 0347 04/27/14 0408  HGB 9.3* 7.5* 8.1*  HCT 25.8* 21.5* 23.4*  WBC 9.3 5.2 4.4  PLT 797* 595* 590*   Dg Chest 2 View  04/25/2014   CLINICAL DATA:  Weakness for 3 weeks.  EXAM: CHEST  2 VIEW  COMPARISON:  None.  FINDINGS: Ill-defined right upper lobe infiltrate with cavitation is noted. Subtle patchy nodular infiltrates in the left mid lung and left lower lobe noted. These findings are consistent with active infectious of infiltrates including granulomas infiltrates. Cavitation in the right upper lobe infiltrate may be related to developing abscess. Cavitary tumor cannot be excluded. Close follow-up chest x-rays to demonstrate clearing suggested. Prominent nipple shadows noted. Cardiomegaly with normal pulmonary vascularity. No pleural effusion or pneumothorax. No acute or focal  bony abnormality.  IMPRESSION: 1. Prominent cavitary infiltrate right upper lobe. These findings are most likely infectious. Active granulomatous disease cannot be excluded. Pulmonary abscess cannot be excluded. 2. Patchy infiltrates left mid lung field a left lower lobe consistent with pneumonia. Close follow-up chest x-rays are suggested to demonstrate clearing of these findings. Underlying tumor particularly in the right upper lobe cannot be excluded.   Electronically Signed   By: Marcello Moores  Register   On: 04/25/2014 17:11   Ct Chest Wo Contrast  04/26/2014   CLINICAL DATA:  Short of breath and 35 lb weight loss.  EXAM: CT CHEST WITHOUT CONTRAST  TECHNIQUE: Multidetector CT imaging of the chest was performed following the standard protocol without IV contrast.  COMPARISON:  12/21/2013  FINDINGS: Mediastinum: The heart size is normal. There is calcified atherosclerotic disease involving the thoracic aorta as well as the LAD, left circumflex and RCA coronary arteries. There is no mediastinal or hilar adenopathy.  Lungs/Pleura: No pleural effusion identified. Multiple bilateral cavitary lesions are identified with surrounding ground-glass attenuation. The largest is in the right upper lobe an measures 4.4 x 4.2 cm, image 20/ series 5. Smaller non cavitary nodules are scattered throughout both lungs.  Upper Abdomen: No acute findings identified within the upper abdomen.  Musculoskeletal: Review of the visualized bony structures is unremarkable. No aggressive lytic or sclerotic bone lesions.  IMPRESSION: 1. Examination is positive for multi focal, bilateral cavitary lesions with surrounding allowing glass attenuation. In in a patient who is immunocompromised findings are concerning for infection including pulmonary tuberculosis, septic emboli and atypical infections. Certain malignancy such as squamous cell carcinoma may also cavitate. 2. No significant mediastinal or hilar adenopathy. 3. Atherosclerotic disease  including multi vessel coronary artery calcification.   Electronically Signed   By: Kerby Moors M.D.   On: 04/26/2014 09:04   Ct Abdomen Pelvis W Contrast  04/25/2014   CLINICAL DATA:  Lower abdominal pain. Cough, weakness. Rectal bleeding. History of ulcerative colitis, hysterectomy, hepatitis-C. History of hypertension and HIV.  EXAM: CT ABDOMEN AND PELVIS WITH CONTRAST  TECHNIQUE: Multidetector CT imaging of the abdomen and pelvis was performed using the standard protocol following bolus administration of intravenous contrast.  CONTRAST:  161m OMNIPAQUE IOHEXOL 300 MG/ML  SOLN  COMPARISON:  None.  FINDINGS: Lower chest: Patchy densities are identified at the lung bases, associated a small cavitary lesions. The heart size is normal.  Upper abdomen: Nonspecific low-attenuation lesion is identified within the posterior right hepatic lobe measuring 7 mm in diameter. A similar nodule is identified in the left hepatic lobe on image 18, also measuring 7 mm in diameter. There is mild periportal edema. No focal abnormality identified within the spleen or pancreas. The adrenal glands have a normal appearance. Kidneys show symmetric bilateral excretion. Small left renal cyst is 8 mm in diameter. Gallbladder is present.  Gastrointestinal tract: The stomach and small bowel loops are normal in appearance. There is diffuse thickening of the ascending, transverse, descending, and sigmoid colonic wall consistent  with colitis.  Pelvis: Urinary bladder has a normal appearance. The uterus is absent. No adnexal mass identified.  Retroperitoneum: There are small retroperitoneal and mesenteric lymph nodes. Periportal lymph nodes appear fluid and somewhat indistinct. The largest left periaortic lymph node measures 1.6 cm in diameter. There is atherosclerotic calcification of the abdominal aorta. No aneurysm.  Abdominal wall: Unremarkable.  Osseous structures: Unremarkable.  IMPRESSION: 1. Numerous cavitary lesions at the lung  bases with considerations including inflammatory and infectious processes. Vasculitis can have a similar appearance but is felt to be less likely. Consider further evaluation with chest x-Urieta and/or chest CT. 2. Significant wall thickening of the entire colon, most notable in the distal aspect, favored to be infectious. Inflammatory causes could have a similar appearance. 3. Periportal and retroperitoneal adenopathy. 4. No abscess or bowel obstruction.   Electronically Signed   By: Shon Hale M.D.   On: 04/25/2014 16:19    ASSESSMENT / PLAN:   Bilateral Cavitary Lesions - ddx includes lung abscess due to chronic microaspiration of oral flora with poor dentition, TB, aspiration and opportunistic infection  Former Heavy Crack Abuse (smoking) Severe Protein Calorie Malnutrition  Hep C+  Anemia  HIV (reported) with low CD4- 140  Plan:  Assess ECHO to r/o vegetation  Follow blood cultures Will defer abx to ID & further HIV work up  Follow up quantiferon gold bronchoscopy today    Kara Mead MD. Shade Flood. [facility] Pager [phone] If no response call [phone]    04/27/2014, 9:22 AM

## 2014-04-27 NOTE — Progress Notes (Signed)
NUTRITION FOLLOW UP/CONSULT  INTERVENTION: Regular diet Snacks prn Ensure Complete po BID, each supplement provides 350 kcal and 13 grams of protein- coupons provided Discontinue Ensure pudding, patient dislikes Resource Breeze tid Educated on high protein, high calorie diet, low fiber as needed for ulcerative colitis symptoms, food safety, handouts provided.  Teach back method used.  Patient able to verbalize. RD to follow.  NUTRITION DIAGNOSIS: Inadequate oral intake related to needs as evidenced by diet recall and clear liquid diet currently..   Goal: Intake of meals and supplements to meet >90% estimated needs.  Monitor:  Intake, labs, weight trend  Reason for Assessment: Consult and MST, low BMI  53 y.o. female  Admitting Dx: Ulcerative colitis, acute  ASSESSMENT: Patient admitted with ulcerative colitis, cavity lung lesion, HCAP, h/o drug use.  Recent HIV dx.  Tolerating regular diet with good appetite.  Likes [employer] and [employer].  Height: Ht Readings from Last 1 Encounters:  04/25/14 5' 8"  (1.727 m)    Weight: Wt Readings from Last 1 Encounters:  04/25/14 105 lb (47.628 kg)    Ideal Body Weight: 140 lbs  % Ideal Body Weight: 75  Wt Readings from Last 10 Encounters:  04/25/14 105 lb (47.628 kg)    Usual Body Weight: 136  % Usual Body Weight: 78  BMI:  Body mass index is 15.97 kg/(m^2).  Estimated Nutritional Needs: Kcal: 1500-1700 Protein: 80-90 gm  Fluid: >1.3L daily  Skin: intact  Diet Order: Diet regular  EDUCATION NEEDS: -Education needs addressed   Intake/Output Summary (Last 24 hours) at 04/27/14 1321 Last data filed at 04/27/14 0600  Gross per 24 hour  Intake   1920 ml  Output      0 ml  Net   1920 ml     Labs:   Recent Labs Lab 04/25/14 1221 04/26/14 0347 04/27/14 0408  NA 131* 135* 136*  K 3.3* 3.7 3.2*  CL 91* 103 106  CO2 28 24 22   BUN 17 12 11   CREATININE 0.80 0.55 0.49*  CALCIUM 9.0 8.2* 8.4   GLUCOSE 162* 157* 174*    CBG (last 3)  No results for input(s): GLUCAP in the last 72 hours.  Scheduled Meds: . butamben-tetracaine-benzocaine  1 spray Topical Once  . feeding supplement (ENSURE COMPLETE)  237 mL Oral TID BM  . feeding supplement (RESOURCE BREEZE)  1 Container Oral TID BM  . heparin  5,000 Units Subcutaneous 3 times per day  . lidocaine  1 application Topical Once  . mirtazapine  15 mg Oral QHS  . pantoprazole  40 mg Oral Daily  . predniSONE  20 mg Oral BID WC  . sulfaSALAzine  1,000 mg Oral BID    Continuous Infusions: . sodium chloride 75 mL/hr at 04/26/14 9562    Past Medical History  Diagnosis Date  . Ulcerative colitis   . GERD (gastroesophageal reflux disease)   . Hypertension   . HIV (human immunodeficiency virus infection)   . Hepatitis C   . Substance abuse   . GI bleed     Past Surgical History  Procedure Laterality Date  . Abdominal hysterectomy      Antonieta Iba, RD, LDN Clinical Inpatient Dietitian Pager:  [phone] Weekend and after hours pager:  [phone]

## 2014-04-27 NOTE — Progress Notes (Signed)
Video bronchoscopy performed Intervention bronchial washings Intervention bronchial brushings Intervention bronchial biopsy Pt tolerated well  Kathie Dike RRT

## 2014-04-28 ENCOUNTER — Encounter (HOSPITAL_COMMUNITY): Payer: Self-pay | Admitting: Pulmonary Disease

## 2014-04-28 DIAGNOSIS — R197 Diarrhea, unspecified: Secondary | ICD-10-CM

## 2014-04-28 DIAGNOSIS — R1084 Generalized abdominal pain: Secondary | ICD-10-CM

## 2014-04-28 LAB — HEPATITIS B SURFACE ANTIBODY,QUALITATIVE: Hep B S Ab: NEGATIVE

## 2014-04-28 LAB — CBC
HEMATOCRIT: 25 % — AB (ref 36.0–46.0)
Hemoglobin: 8.8 g/dL — ABNORMAL LOW (ref 12.0–15.0)
MCH: 29 pg (ref 26.0–34.0)
MCHC: 35.2 g/dL (ref 30.0–36.0)
MCV: 82.5 fL (ref 78.0–100.0)
Platelets: 600 10*3/uL — ABNORMAL HIGH (ref 150–400)
RBC: 3.03 MIL/uL — ABNORMAL LOW (ref 3.87–5.11)
RDW: 14.5 % (ref 11.5–15.5)
WBC: 6.6 10*3/uL (ref 4.0–10.5)

## 2014-04-28 LAB — PNEUMOCYSTIS JIROVECI SMEAR BY DFA: Pneumocystis jiroveci Ag: NEGATIVE

## 2014-04-28 LAB — HEPATITIS B SURFACE ANTIGEN: Hepatitis B Surface Ag: NEGATIVE

## 2014-04-28 LAB — HEPATITIS A ANTIBODY, TOTAL: Hep A Total Ab: REACTIVE — AB

## 2014-04-28 LAB — CULTURE, RESPIRATORY: GRAM STAIN: NONE SEEN

## 2014-04-28 LAB — CULTURE, RESPIRATORY W GRAM STAIN: Culture: NORMAL

## 2014-04-28 LAB — HEPATITIS B CORE ANTIBODY, TOTAL: HEP B C TOTAL AB: NONREACTIVE

## 2014-04-28 NOTE — Progress Notes (Addendum)
Patient ID: [name] [name], female   DOB: August 22, 1960, 53 y.o.   MRN: [id number] [facility] PROGRESS NOTE  [name] [name]:[id number] DOB: 1960-11-14 DOA: 04/25/2014 PCP: No primary care provider on file.  Brief narrative: 53 y.o. female with known history of IVDU, reently diagnosed with HIV (not seen by ID specialist and not started on HAART), who presented with 2 week history of lower abdominal pain, vomiting, weight loss, and diminished appetite, BRBPR. Recently hospitalized at [facility] for UC flare where she was treated with solumedrol and was discharged with script for Prednisone and Sulfasalazine but pt explains she was unable to get it.   Work up in the ED revealed cavitary lesions in  lower lobes on CT abd/pelvis, a CXR also notable for cavitary lesion in the RUL, as well as findings of PNA in left lower lobe. There is no mention of cavitary lesions on her [place] H&P nor discharge summary and patient reports that she did not have a CXR during that admit. Patient underwent bronchoscopy 04/27/2014 with brushings & transbronchial biopsies x 3 were obtained from RUL cavitary lesion.  Assessment/Plan:    Principal Problem:  Acute hypoxic respiratory failure / HCAP - likely multifactorial, multi focal, bilateral cavitary lesions concerning for infection including pulmonary tuberculosis, septic emboli and atypical infections - pt started on vancomycin and zosyn to cover for HCAP (given recent hospitalization and immunocompromised state) but per ID we held it pending BAL results - pt underwent bronchoscopy 04/27/2014 with brushings & transbronchial biopsies x 3 were obtained from RUL cavitary lesion (follow up on AFB, bacterial, fungal, PCP, cytology) - AFB x 1 negative, resp culture - normal oropharyngeal flora, PCP ag negative from BAL - started on bactrim daily  - continue to provide oxygen supportive [place] to keep O2 saturation above 90%  Active Problems:  Hypotension -  BP 118/69  Anemia of chronic disease, likely HIV - drop in hg since admission possibly dilutional from IVF pt has been receiving since admission - no signs of active bleeding, transfused one unit of blood (11/10) - posttransfusion Hgb 8.1 --> 8.8  Thrombocytosis - possibly secondary to an inflammatory process  Hyponatremia - likely pre renal from poor oral intake - Na trending up after receiving IVF: 131 --> 136  Hypokalemia  - likely from lasix  -continue to supplement - follow-up magnesium and potassium level in the morning.  Abdominal pain - from underlying UC imposed on new infection as noted above - currently on prednisone and sulfasalazine   HIV positive - ID consulted, appreciate assistance - CD4 140; hold off on ARV until BAL results   Severe PCM - likely from underlying infection - advance diet when pt able to tolerate   DVT Prophylaxis:   Heparin SQ  Code Status: Full.  Family Communication: plan of care discussed with the patient Disposition Plan: Home when stable.     IV access:   PeripheralIV  Procedures and diagnostic studies:    Dg Chest 2 View 04/25/2014 Prominent cavitary infiltrate right upper lobe. These findings are most likely infectious. Active granulomatous disease cannot be excluded. Pulmonary abscess cannot be excluded. Patchy infiltrates left mid lung field a left lower lobe consistent with pneumonia. Close follow-up chest x-rays are suggested to demonstrate clearing of these findings. Underlying tumor particularly in the right upper lobe cannot be excluded.   Ct Chest Wo Contrast 04/26/2014 Examination is positive for multi focal, bilateral cavitary lesions with surrounding allowing glass attenuation. In a patient who is  immunocompromised findings are concerning for infection including pulmonary tuberculosis, septic emboli and atypical infections. Certain malignancy such as squamous cell carcinoma may also cavitate. No  significant mediastinal or hilar adenopathy. Atherosclerotic disease including multi vessel coronary artery calcification.   Ct Abdomen Pelvis W Contrast 04/25/2014 Numerous cavitary lesions at the lung bases with considerations including inflammatory and infectious processes. Vasculitis can have a similar appearance but is felt to be less likely. Significant wall thickening of the entire colon, most notable in the distal aspect, favored to be infectious. Inflammatory causes could have a similar appearance. Periportal and retroperitoneal adenopathy. No abscess or bowel obstruction.  Bronchoscopy 04/27/2014 Brushings & transbronchial biopsies x 3 were obtained from RUL cavitary lesion  Dg Chest [facility] 04/27/2014    No pneumothorax post bronchoscopy.  Persisting cavitary lesion in the RIGHT upper lobe and mild scattered infiltrates, favor infectious.    Medical Consultants:   Infectious disease (Dr. Dominga Ferry COmer)  Pulmonary  Other Consultants:   Nutrition  IAnti-Infectives:    Vancomycin 11/09 --> 04/26/2014   Maxipime 11/09 --> 04/26/2014  Bactrim 04/26/2014 -->    Leisa Lenz, MD  [facility] Pager [phone]  If 7PM-7AM, please contact night-coverage [email or web address] Password [email or web address] 04/28/2014, 11:02 AM   LOS: 3 days    HPI/Subjective: No acute overnight events.  Objective: Filed Vitals:   04/27/14 1000 04/27/14 1320 04/27/14 2104 04/28/14 0531  BP: 123/73 121/63 125/70 118/69  Pulse: 78 91 81 79  Temp:  98.1 F (36.7 C) 98.4 F (36.9 C) 98.6 F (37 C)  TempSrc:  Oral Oral Oral  Resp: 22 20 22 22   Height:      Weight:      SpO2: 100% 100% 100% 100%    Intake/Output Summary (Last 24 hours) at 04/28/14 1102 Last data filed at 04/28/14 0556  Gross per 24 hour  Intake 2739.8 ml  Output    150 ml  Net 2589.8 ml    Exam:   General:  Pt is alert, follows commands appropriately, not in acute distress  Cardiovascular: Regular rate  and rhythm, S1/S2 appreciated   Respiratory: Clear to auscultation bilaterally, no wheezing  Abdomen: Soft, non tender, non distended, bowel sounds present  Extremities: No edema, pulses DP and PT palpable bilaterally  Neuro: Grossly nonfocal  Data Reviewed: Basic Metabolic Panel:  Recent Labs Lab 04/25/14 1221 04/26/14 0347 04/27/14 0408  NA 131* 135* 136*  K 3.3* 3.7 3.2*  CL 91* 103 106  CO2 28 24 22   GLUCOSE 162* 157* 174*  BUN 17 12 11   CREATININE 0.80 0.55 0.49*  CALCIUM 9.0 8.2* 8.4   Liver Function Tests:  Recent Labs Lab 04/25/14 1221  AST 19  ALT 8  ALKPHOS 377*  BILITOT 0.4  PROT 8.8*  ALBUMIN 2.0*    Recent Labs Lab 04/25/14 1221  LIPASE 55   No results for input(s): AMMONIA in the last 168 hours. CBC:  Recent Labs Lab 04/25/14 1221 04/26/14 0347 04/27/14 0408 04/28/14 0510  WBC 9.3 5.2 4.4 6.6  NEUTROABS 4.7  --   --   --   HGB 9.3* 7.5* 8.1* 8.8*  HCT 25.8* 21.5* 23.4* 25.0*  MCV 79.9 79.3 81.0 82.5  PLT 797* 595* 590* 600*   Cardiac Enzymes: No results for input(s): CKTOTAL, CKMB, CKMBINDEX, TROPONINI in the last 168 hours. BNP: Invalid input(s): POCBNP CBG: No results for input(s): GLUCAP in the last 168 hours.  Recent Results (from the past 240 hour(s))  Culture, blood (  routine x 2)     Status: None (Preliminary result)   Collection Time: 04/25/14  7:38 PM  Result Value Ref Range Status   Specimen Description BLOOD RIGHT ARM  Final   Special Requests BOTTLES DRAWN AEROBIC AND ANAEROBIC 10CC  Final   Culture  Setup Time   Final    04/26/2014 01:22 Performed at [facility]    Culture   Final           BLOOD CULTURE RECEIVED NO GROWTH TO DATE CULTURE WILL BE HELD FOR 5 DAYS BEFORE ISSUING A FINAL NEGATIVE REPORT Performed at [facility]    Report Status PENDING  Incomplete  Culture, blood (routine x 2)     Status: None (Preliminary result)   Collection Time: 04/25/14  7:48 PM  Result Value Ref Range  Status   Specimen Description BLOOD RIGHT HAND  Final   Special Requests BOTTLES DRAWN AEROBIC AND ANAEROBIC 10CC  Final   Culture  Setup Time   Final    04/26/2014 01:22 Performed at [facility]    Culture   Final           BLOOD CULTURE RECEIVED NO GROWTH TO DATE CULTURE WILL BE HELD FOR 5 DAYS BEFORE ISSUING A FINAL NEGATIVE REPORT Performed at [facility]    Report Status PENDING  Incomplete  AFB culture with smear     Status: None (Preliminary result)   Collection Time: 04/26/14  5:10 PM  Result Value Ref Range Status   Specimen Description SPUTUM  Final   Special Requests Immunocompromised  Final   Acid Fast Smear   Final    NO ACID FAST BACILLI SEEN Performed at [facility]    Culture   Final    CULTURE WILL BE EXAMINED FOR 6 WEEKS BEFORE ISSUING A FINAL REPORT Performed at [facility]    Report Status PENDING  Incomplete  Culture, expectorated sputum-assessment     Status: None   Collection Time: 04/26/14  5:11 PM  Result Value Ref Range Status   Specimen Description SPUTUM  Final   Special Requests Immunocompromised  Final   Sputum evaluation   Final    THIS SPECIMEN IS ACCEPTABLE. RESPIRATORY CULTURE REPORT TO FOLLOW.   Report Status 04/26/2014 FINAL  Final  Culture, respiratory (NON-Expectorated)     Status: None   Collection Time: 04/26/14  5:11 PM  Result Value Ref Range Status   Specimen Description SPUTUM  Final   Special Requests NONE  Final   Gram Stain   Final    NO WBC SEEN RARE SQUAMOUS EPITHELIAL CELLS PRESENT RARE GRAM POSITIVE COCCI IN PAIRS RARE GRAM NEGATIVE RODS RARE GRAM NEGATIVE COCCI    Culture   Final    NORMAL OROPHARYNGEAL FLORA Performed at [facility]    Report Status 04/28/2014 FINAL  Final  Culture, bal-quantitative     Status: None (Preliminary result)   Collection Time: 04/27/14  9:10 AM  Result Value Ref Range Status   Specimen Description BRONCHIAL ALVEOLAR LAVAGE  Final    Special Requests NONE  Final   Gram Stain   Final    ABUNDANT WBC PRESENT, PREDOMINANTLY PMN RARE SQUAMOUS EPITHELIAL CELLS PRESENT FEW GRAM POSITIVE COCCI IN PAIRS IN CHAINS IN CLUSTERS Performed at [place] PENDING  Incomplete   Culture   Final    Culture reincubated for better growth Performed at [facility]  Report Status PENDING  Incomplete  Fungus Culture with Smear     Status: None (Preliminary result)   Collection Time: 04/27/14  9:10 AM  Result Value Ref Range Status   Specimen Description BRONCHIAL ALVEOLAR LAVAGE  Final   Special Requests NONE  Final   Fungal Smear   Final    NO YEAST OR FUNGAL ELEMENTS SEEN Performed at [facility]    Culture   Final    CULTURE IN PROGRESS FOR FOUR WEEKS Performed at [facility]    Report Status PENDING  Incomplete  Pneumocystis smear by DFA     Status: None   Collection Time: 04/27/14  9:10 AM  Result Value Ref Range Status   Specimen Source-PJSRC BRONCHIAL ALVEOLAR LAVAGE  Final   Pneumocystis jiroveci Ag NEGATIVE  Final    Comment: Performed at [facility]     Scheduled Meds: . butamben-tetracaine-benzocaine  1 spray Topical Once  . feeding supplement (ENSURE COMPLETE)  237 mL Oral TID BM  . feeding supplement (RESOURCE BREEZE)  1 Container Oral TID BM  . heparin  5,000 Units Subcutaneous 3 times per day  . lidocaine  1 application Topical Once  . mirtazapine  15 mg Oral QHS  . pantoprazole  40 mg Oral Daily  . predniSONE  20 mg Oral BID WC  . sulfamethoxazole-trimethoprim  1 tablet Oral Daily  . sulfaSALAzine  1,000 mg Oral BID   Continuous Infusions: . sodium chloride 75 mL/hr at 04/28/14 0556

## 2014-04-28 NOTE — Plan of Care (Signed)
Problem: Consults Goal: Skin Care Protocol Initiated - if Braden Score 18 or less If consults are not indicated, leave blank or document N/A Outcome: Not Applicable Date Met:  63/14/97  Problem: Phase I Progression Outcomes Goal: Pain controlled with appropriate interventions Outcome: Completed/Met Date Met:  04/28/14 Goal: OOB as tolerated unless otherwise ordered Outcome: Completed/Met Date Met:  04/28/14

## 2014-04-28 NOTE — Progress Notes (Signed)
[facility] for Infectious Disease  Date of Admission:  04/25/2014  Antibiotics: Bactrim prophylaxis  Subjective: Diarrhea overnight, C diff PCR ordered  Objective: Temp:  [98.4 F (36.9 C)-98.6 F (37 C)] 98.6 F (37 C) (11/12 0531) Pulse Rate:  [79-81] 79 (11/12 0531) Resp:  [22] 22 (11/12 0531) BP: (118-125)/(69-70) 118/69 mmHg (11/12 0531) SpO2:  [100 %] 100 % (11/12 0531)  General: awake, in bed Skin: no rashes Lungs: CTA B Cor: RRR Abdomen: soft, nt, nd Ext: no edema  Lab Results Lab Results  Component Value Date   WBC 6.6 04/28/2014   HGB 8.8* 04/28/2014   HCT 25.0* 04/28/2014   MCV 82.5 04/28/2014   PLT 600* 04/28/2014    Lab Results  Component Value Date   CREATININE 0.49* 04/27/2014   BUN 11 04/27/2014   NA 136* 04/27/2014   K 3.2* 04/27/2014   CL 106 04/27/2014   CO2 22 04/27/2014    Lab Results  Component Value Date   ALT 8 04/25/2014   AST 19 04/25/2014   ALKPHOS 377* 04/25/2014   BILITOT 0.4 04/25/2014      Microbiology: Recent Results (from the past 240 hour(s))  Culture, blood (routine x 2)     Status: None (Preliminary result)   Collection Time: 04/25/14  7:38 PM  Result Value Ref Range Status   Specimen Description BLOOD RIGHT ARM  Final   Special Requests BOTTLES DRAWN AEROBIC AND ANAEROBIC 10CC  Final   Culture  Setup Time   Final    04/26/2014 01:22 Performed at [facility]    Culture   Final           BLOOD CULTURE RECEIVED NO GROWTH TO DATE CULTURE WILL BE HELD FOR 5 DAYS BEFORE ISSUING A FINAL NEGATIVE REPORT Performed at [facility]    Report Status PENDING  Incomplete  Culture, blood (routine x 2)     Status: None (Preliminary result)   Collection Time: 04/25/14  7:48 PM  Result Value Ref Range Status   Specimen Description BLOOD RIGHT HAND  Final   Special Requests BOTTLES DRAWN AEROBIC AND ANAEROBIC 10CC  Final   Culture  Setup Time   Final    04/26/2014 01:22 Performed at [facility]    Culture   Final           BLOOD CULTURE RECEIVED NO GROWTH TO DATE CULTURE WILL BE HELD FOR 5 DAYS BEFORE ISSUING A FINAL NEGATIVE REPORT Performed at [facility]    Report Status PENDING  Incomplete  AFB culture with smear     Status: None (Preliminary result)   Collection Time: 04/26/14  5:10 PM  Result Value Ref Range Status   Specimen Description SPUTUM  Final   Special Requests Immunocompromised  Final   Acid Fast Smear   Final    NO ACID FAST BACILLI SEEN Performed at [facility]    Culture   Final    CULTURE WILL BE EXAMINED FOR 6 WEEKS BEFORE ISSUING A FINAL REPORT Performed at [facility]    Report Status PENDING  Incomplete  Culture, expectorated sputum-assessment     Status: None   Collection Time: 04/26/14  5:11 PM  Result Value Ref Range Status   Specimen Description SPUTUM  Final   Special Requests Immunocompromised  Final   Sputum evaluation   Final    THIS SPECIMEN IS ACCEPTABLE. RESPIRATORY CULTURE REPORT TO FOLLOW.   Report Status 04/26/2014  FINAL  Final  Culture, respiratory (NON-Expectorated)     Status: None   Collection Time: 04/26/14  5:11 PM  Result Value Ref Range Status   Specimen Description SPUTUM  Final   Special Requests NONE  Final   Gram Stain   Final    NO WBC SEEN RARE SQUAMOUS EPITHELIAL CELLS PRESENT RARE GRAM POSITIVE COCCI IN PAIRS RARE GRAM NEGATIVE RODS RARE GRAM NEGATIVE COCCI    Culture   Final    NORMAL OROPHARYNGEAL FLORA Performed at [facility]    Report Status 04/28/2014 FINAL  Final  AFB culture with smear     Status: None (Preliminary result)   Collection Time: 04/27/14  9:10 AM  Result Value Ref Range Status   Specimen Description BRONCHIAL ALVEOLAR LAVAGE  Final   Special Requests Immunocompromised  Final   Acid Fast Smear   Final    NO ACID FAST BACILLI SEEN Performed at [facility]    Culture   Final    CULTURE WILL BE EXAMINED FOR 6 WEEKS BEFORE  ISSUING A FINAL REPORT Performed at [facility]    Report Status PENDING  Incomplete  Culture, bal-quantitative     Status: None (Preliminary result)   Collection Time: 04/27/14  9:10 AM  Result Value Ref Range Status   Specimen Description BRONCHIAL ALVEOLAR LAVAGE  Final   Special Requests NONE  Final   Gram Stain   Final    ABUNDANT WBC PRESENT, PREDOMINANTLY PMN RARE SQUAMOUS EPITHELIAL CELLS PRESENT FEW GRAM POSITIVE COCCI IN PAIRS IN CHAINS IN CLUSTERS Performed at [facility] PENDING  Incomplete   Culture   Final    Culture reincubated for better growth Performed at [facility]    Report Status PENDING  Incomplete  Fungus Culture with Smear     Status: None (Preliminary result)   Collection Time: 04/27/14  9:10 AM  Result Value Ref Range Status   Specimen Description BRONCHIAL ALVEOLAR LAVAGE  Final   Special Requests NONE  Final   Fungal Smear   Final    NO YEAST OR FUNGAL ELEMENTS SEEN Performed at [facility]    Culture   Final    CULTURE IN PROGRESS FOR FOUR WEEKS Performed at [facility]    Report Status PENDING  Incomplete  Pneumocystis smear by DFA     Status: None   Collection Time: 04/27/14  9:10 AM  Result Value Ref Range Status   Specimen Source-PJSRC BRONCHIAL ALVEOLAR LAVAGE  Final   Pneumocystis jiroveci Ag NEGATIVE  Final    Comment: Performed at [facility]  AFB culture with smear     Status: None (Preliminary result)   Collection Time: 04/27/14  9:10 AM  Result Value Ref Range Status   Specimen Description BIOPSY LUNG  Final   Special Requests NONE  Final   Acid Fast Smear   Final    NO ACID FAST BACILLI SEEN Performed at [facility]    Culture   Final    CULTURE WILL BE EXAMINED FOR 6 WEEKS BEFORE ISSUING A FINAL REPORT Performed at [facility]    Report Status PENDING  Incomplete    Studies/Results: Dg Chest Port 1 View  04/27/2014    CLINICAL DATA:  Post bronchoscopy and RIGHT upper lobe biopsy  EXAM: PORTABLE CHEST - 1 VIEW  COMPARISON:  Portable exam 0941 hr compared to CT chest 04/26/2014  FINDINGS: Borderline enlargement of cardiac silhouette.  Mediastinal contours and pulmonary vascularity normal.  Cavitary opacity in RIGHT upper lobe again identified corresponding to cavitary lesion on CT.  Scattered RIGHT upper lobe and LEFT perihilar infiltrates present.  No pneumothorax post bronchoscopy.  Remaining lungs clear.  No pleural effusion.  IMPRESSION: No pneumothorax post bronchoscopy.  Persisting cavitary lesion in the RIGHT upper lobe and mild scattered infiltrates, favor infectious.   Electronically Signed   By: Lavonia Dana M.D.   On: 04/27/2014 09:58   Dg C-arm Bronchoscopy  04/27/2014   CLINICAL DATA:    C-ARM BRONCHOSCOPY  Fluoroscopy was utilized by the requesting physician.  No radiographic  interpretation.     Assessment/Plan: 1) cavitary lesions - unknown at this time.  Echo without obvious vegetations, though certainly could still be septic emboli but blood cultures remain negative.   AFB smear from BAL negative so I have stopped airborne precautions.   GPC on gram stain.   Will start Augmentin for possible aspiration/lung abscess. Will continue to monitor cultures  We will follow her CXR in about 4 weeks and will continue augmentin until CXR resolution since we will be following her for HIV anyway (unless something else grows). -cytology also pending. -Tb PCR pending  2) HIV/AIDS - On Bactrim prophylaxis, holding on ARVs pending #1.  We will get her into our clinic to get drug assistance.   She also would benefit in getting a PCP and wants one here in [place].  ? Sickle cell clinic or [facility]? Could she get set up with an [place] card? We will get her in [facility] in 1-2 weeks for consideration of ARVs  Bhavya Grand, [place]bie [place], [facility] [place] [email or web address] [phone]  pager   [phone] cell 04/28/2014, 2:06 PM

## 2014-04-28 NOTE — Progress Notes (Signed)
Patient was placed on Enteric precautions secondary to more than 3 loose stools. Noted blood in stools. Unable to collect sample for PCR at this time due to stools being mixed with urine. Patient was educated re: reason being placed on contact isolation.

## 2014-04-28 NOTE — Plan of Care (Signed)
Problem: Consults Goal: Diabetes Guidelines if Diabetic/Glucose > 140 If diabetic or lab glucose is > 140 mg/dl - Initiate Diabetes/Hyperglycemia Guidelines & Document Interventions  Outcome: Not Applicable Date Met:  04/28/14  Problem: Phase I Progression Outcomes Goal: Hemodynamically stable Outcome: Completed/Met Date Met:  04/28/14     

## 2014-04-28 NOTE — Progress Notes (Signed)
Name: [name] [name] MRN: [id number] DOB: 05/08/1961    ADMISSION DATE:  04/25/2014 CONSULTATION DATE:  04/26/14  REFERRING MD :  Dr. Charlies Silvers   CHIEF COMPLAINT:  Cavitary Lesion on CT Chest     HISTORY OF PRESENT ILLNESS:  53 y/o F with a PMH of UC, GERD, HTN, Hep C, Substance abuse & HIV (reported new diagnosis during recent [facility] admission)  who presented to [facility] ER on 11/9 with a 3 week hx of weakness, non-productive cough, lower abdominal pain, rectal bleeding and decreased appetite.  She reports she has lost approximately 32 lbs in the last month.  The patient was recently admitted to [facility] for an ulcerative colitis flare and followed up at [facility] on 11/2 with an NP on 11/3 and was instructed to set up an appointment with the HIV clinic in [facility].  The patient did not follow up with the clinic because of transportation issues.  She previously had been on sulfasalazine which reportedly helped with bloody stools but she quit taking due to nausea / vomiting.  At the 11/3 visit, her home lasix was discontinued due to tachycardia and hypotension.    She currently reports she was diagnosed with HIV but her family is not aware and she is very concerned that they will find out about the diagnosis.  The patient reports she never injected IV drugs.  Her drug of choice was crack and marijuana.  She used to be a fairly heavy crack abuser, infrequent ETOH.  She denies being sexually active.  She denies smoking cigarettes.  Patient reports current sensation of shortness of breath, dry non-productive cough, intermittent fevers, R posterior chest pain, decreased appetite, nausea/vomiting, occasional swelling of lower extremities.  She denies skin lesions, hemoptysis.  Indicates multiple broken and decaying teeth, has not seen a dentist.  Former exposure (29 years ago) of TB - her father-in-law had TB and was treated during her pregnancy with her daughter.    SIGNIFICANT EVENTS  11/10  Admit with abd pain,  wt loss, vomiting, hypotension, hypoxia and incidental finding of cavitary lung lesions.    STUDIES:  11/10  CT Chest >> multi-focal bilateral cavitary lesions with surrounding ground glass, no sig adenopathy, multi-vessel CAD   SUBJECTIVE: afebrile Denies CP/ dyspnea/hemoptysis C/ o Coughing   VITAL SIGNS: Temp:  [98.1 F (36.7 C)-98.6 F (37 C)] 98.6 F (37 C) (11/12 0531) Pulse Rate:  [79-91] 79 (11/12 0531) Resp:  [20-22] 22 (11/12 0531) BP: (118-125)/(63-70) 118/69 mmHg (11/12 0531) SpO2:  [100 %] 100 % (11/12 0531)  PHYSICAL EXAMINATION: General:  Cachectic female in NAD  Neuro:  AAOx4, speech clear, MAE  HEENT:  Mm pink/moist, poor dentition, dental carries / broken teeth Cardiovascular:  s1s2 rrr, no m/r/g Lungs:  resp's even/non-labored, lungs bilaterally clear Abdomen:  Flat, soft, bsx4 active  Musculoskeletal:  No acute deformities  Skin:  Warm/dry, no edema    Recent Labs Lab 04/25/14 1221 04/26/14 0347 04/27/14 0408  NA 131* 135* 136*  K 3.3* 3.7 3.2*  CL 91* 103 106  CO2 28 24 22   BUN 17 12 11   CREATININE 0.80 0.55 0.49*  GLUCOSE 162* 157* 174*    Recent Labs Lab 04/26/14 0347 04/27/14 0408 04/28/14 0510  HGB 7.5* 8.1* 8.8*  HCT 21.5* 23.4* 25.0*  WBC 5.2 4.4 6.6  PLT 595* 590* 600*   Dg Chest Port 1 View  04/27/2014   CLINICAL DATA:  Post bronchoscopy and RIGHT upper lobe biopsy  EXAM:  PORTABLE CHEST - 1 VIEW  COMPARISON:  Portable exam 0941 hr compared to CT chest 04/26/2014  FINDINGS: Borderline enlargement of cardiac silhouette.  Mediastinal contours and pulmonary vascularity normal.  Cavitary opacity in RIGHT upper lobe again identified corresponding to cavitary lesion on CT.  Scattered RIGHT upper lobe and LEFT perihilar infiltrates present.  No pneumothorax post bronchoscopy.  Remaining lungs clear.  No pleural effusion.  IMPRESSION: No pneumothorax post bronchoscopy.  Persisting cavitary lesion in the RIGHT upper lobe and mild scattered  infiltrates, favor infectious.   Electronically Signed   By: Lavonia Dana M.D.   On: 04/27/2014 09:58   Dg C-arm Bronchoscopy  04/27/2014   CLINICAL DATA:    C-ARM BRONCHOSCOPY  Fluoroscopy was utilized by the requesting physician.  No radiographic  interpretation.     ASSESSMENT / PLAN:   Bilateral Cavitary Lesions - ddx includes lung abscess due to chronic microaspiration of oral flora with poor dentition, TB, aspiration and opportunistic infection  TTE -no vegetation , BAL for PCP neg,  quantiferon gold indet Former Heavy Crack Abuse (smoking) Severe Protein Calorie Malnutrition  Hep C+  Anemia  AIDS with  CD4- 140 -   Plan: Await TBBX results, if all cx neg - would treat s lung abscess with prolonged abx (4-6 wks depending on CXR resolution) She has not disclosed diagnosis to daughter but agrees to do so.    Kara Mead MD. Shade Flood. [facility] Pager [phone] If no response call [phone]    04/28/2014, 11:59 AM

## 2014-04-29 LAB — HIV-1 RNA ULTRAQUANT REFLEX TO GENTYP+
HIV 1 RNA Quant: 45997 copies/mL — ABNORMAL HIGH (ref <20)
HIV-1 RNA QUANT, LOG: 4.66 {Log} — AB (ref <1.30)

## 2014-04-29 LAB — HCV RNA QUANT RFLX ULTRA OR GENOTYP: HCV Quantitative: NOT DETECTED IU/mL — ABNORMAL LOW (ref <15)

## 2014-04-29 MED ORDER — HYOSCYAMINE SULFATE 0.125 MG PO TABS
0.1250 mg | ORAL_TABLET | Freq: Two times a day (BID) | ORAL | Status: DC | PRN
  Administered 2014-04-29: 0.125 mg via ORAL
  Filled 2014-04-29 (×2): qty 1

## 2014-04-29 MED ORDER — ZOLPIDEM TARTRATE 5 MG PO TABS
5.0000 mg | ORAL_TABLET | Freq: Every day | ORAL | Status: DC
  Administered 2014-04-29 – 2014-05-02 (×4): 5 mg via ORAL
  Filled 2014-04-29 (×4): qty 1

## 2014-04-29 MED ORDER — LINEZOLID 600 MG PO TABS
600.0000 mg | ORAL_TABLET | Freq: Two times a day (BID) | ORAL | Status: DC
  Administered 2014-04-29 – 2014-05-03 (×8): 600 mg via ORAL
  Filled 2014-04-29 (×9): qty 1

## 2014-04-29 NOTE — Progress Notes (Signed)
[facility] for Infectious Disease  Date of Admission:  04/25/2014  Antibiotics: Bactrim prophylaxis  Subjective: No acute events  Objective: Temp:  [98 F (36.7 C)-98.9 F (37.2 C)] 98.7 F (37.1 C) (11/13 0525) Pulse Rate:  [64-78] 64 (11/13 0525) Resp:  [16-18] 18 (11/13 0525) BP: (122-130)/(67-75) 130/67 mmHg (11/13 0525) SpO2:  [100 %] 100 % (11/13 0525)  General: awake, in bed Skin: no rashes Lungs: CTA B Cor: RRR Abdomen: soft, nt, nd Ext: no edema  Lab Results Lab Results  Component Value Date   WBC 6.6 04/28/2014   HGB 8.8* 04/28/2014   HCT 25.0* 04/28/2014   MCV 82.5 04/28/2014   PLT 600* 04/28/2014    Lab Results  Component Value Date   CREATININE 0.49* 04/27/2014   BUN 11 04/27/2014   NA 136* 04/27/2014   K 3.2* 04/27/2014   CL 106 04/27/2014   CO2 22 04/27/2014    Lab Results  Component Value Date   ALT 8 04/25/2014   AST 19 04/25/2014   ALKPHOS 377* 04/25/2014   BILITOT 0.4 04/25/2014      Microbiology: Recent Results (from the past 240 hour(s))  Culture, blood (routine x 2)     Status: None (Preliminary result)   Collection Time: 04/25/14  7:38 PM  Result Value Ref Range Status   Specimen Description BLOOD RIGHT ARM  Final   Special Requests BOTTLES DRAWN AEROBIC AND ANAEROBIC 10CC  Final   Culture  Setup Time   Final    04/26/2014 01:22 Performed at [facility]    Culture   Final           BLOOD CULTURE RECEIVED NO GROWTH TO DATE CULTURE WILL BE HELD FOR 5 DAYS BEFORE ISSUING A FINAL NEGATIVE REPORT Performed at [facility]    Report Status PENDING  Incomplete  Culture, blood (routine x 2)     Status: None (Preliminary result)   Collection Time: 04/25/14  7:48 PM  Result Value Ref Range Status   Specimen Description BLOOD RIGHT HAND  Final   Special Requests BOTTLES DRAWN AEROBIC AND ANAEROBIC 10CC  Final   Culture  Setup Time   Final    04/26/2014 01:22 Performed at [facility]    Culture    Final           BLOOD CULTURE RECEIVED NO GROWTH TO DATE CULTURE WILL BE HELD FOR 5 DAYS BEFORE ISSUING A FINAL NEGATIVE REPORT Performed at [facility]    Report Status PENDING  Incomplete  AFB culture with smear     Status: None (Preliminary result)   Collection Time: 04/26/14  5:10 PM  Result Value Ref Range Status   Specimen Description SPUTUM  Final   Special Requests Immunocompromised  Final   Acid Fast Smear   Final    NO ACID FAST BACILLI SEEN Performed at [facility]    Culture   Final    CULTURE WILL BE EXAMINED FOR 6 WEEKS BEFORE ISSUING A FINAL REPORT Performed at [facility]    Report Status PENDING  Incomplete  Culture, expectorated sputum-assessment     Status: None   Collection Time: 04/26/14  5:11 PM  Result Value Ref Range Status   Specimen Description SPUTUM  Final   Special Requests Immunocompromised  Final   Sputum evaluation   Final    THIS SPECIMEN IS ACCEPTABLE. RESPIRATORY CULTURE REPORT TO FOLLOW.   Report Status 04/26/2014 FINAL  Final  Culture, respiratory (NON-Expectorated)     Status: None   Collection Time: 04/26/14  5:11 PM  Result Value Ref Range Status   Specimen Description SPUTUM  Final   Special Requests NONE  Final   Gram Stain   Final    NO WBC SEEN RARE SQUAMOUS EPITHELIAL CELLS PRESENT RARE GRAM POSITIVE COCCI IN PAIRS RARE GRAM NEGATIVE RODS RARE GRAM NEGATIVE COCCI    Culture   Final    NORMAL OROPHARYNGEAL FLORA Performed at [facility]    Report Status 04/28/2014 FINAL  Final  AFB culture with smear     Status: None (Preliminary result)   Collection Time: 04/27/14  9:10 AM  Result Value Ref Range Status   Specimen Description BRONCHIAL ALVEOLAR LAVAGE  Final   Special Requests Immunocompromised  Final   Acid Fast Smear   Final    NO ACID FAST BACILLI SEEN Performed at [facility]    Culture   Final    CULTURE WILL BE EXAMINED FOR 6 WEEKS BEFORE ISSUING A FINAL  REPORT Performed at [facility]    Report Status PENDING  Incomplete  Culture, bal-quantitative     Status: None (Preliminary result)   Collection Time: 04/27/14  9:10 AM  Result Value Ref Range Status   Specimen Description BRONCHIAL ALVEOLAR LAVAGE  Final   Special Requests NONE  Final   Gram Stain   Final    ABUNDANT WBC PRESENT, PREDOMINANTLY PMN RARE SQUAMOUS EPITHELIAL CELLS PRESENT FEW GRAM POSITIVE COCCI IN PAIRS IN CHAINS IN CLUSTERS Performed at [facility]   Final    >=100,000 COLONIES/ML Performed at [facility]    Culture   Final    STAPHYLOCOCCUS AUREUS Note: RIFAMPIN AND GENTAMICIN SHOULD NOT BE USED AS SINGLE DRUGS FOR TREATMENT OF STAPH INFECTIONS. Performed at [facility]    Report Status PENDING  Incomplete  Fungus Culture with Smear     Status: None (Preliminary result)   Collection Time: 04/27/14  9:10 AM  Result Value Ref Range Status   Specimen Description BRONCHIAL ALVEOLAR LAVAGE  Final   Special Requests NONE  Final   Fungal Smear   Final    NO YEAST OR FUNGAL ELEMENTS SEEN Performed at [facility]    Culture   Final    CULTURE IN PROGRESS FOR FOUR WEEKS Performed at [facility]    Report Status PENDING  Incomplete  Pneumocystis smear by DFA     Status: None   Collection Time: 04/27/14  9:10 AM  Result Value Ref Range Status   Specimen Source-PJSRC BRONCHIAL ALVEOLAR LAVAGE  Final   Pneumocystis jiroveci Ag NEGATIVE  Final    Comment: Performed at [facility]  AFB culture with smear     Status: None (Preliminary result)   Collection Time: 04/27/14  9:10 AM  Result Value Ref Range Status   Specimen Description BIOPSY LUNG  Final   Special Requests NONE  Final   Acid Fast Smear   Final    NO ACID FAST BACILLI SEEN Performed at [facility]    Culture   Final    CULTURE WILL BE EXAMINED FOR 6 WEEKS BEFORE ISSUING A FINAL REPORT Performed at  [facility]    Report Status PENDING  Incomplete    Studies/Results: No results found.  Assessment/Plan: 1) cavitary lesions - now growing Staph aureus from bronchoscopy.  Blood cultures remain  negative but I still am concerned about endocarditis.  TTE negative. With this finding, I would recommend a TEE which will change the treatment (would need IV if positive).     2) HIV/AIDS - On Bactrim prophylaxis, holding on ARVs pending access.  We will get her into our clinic to get drug assistance.     Scharlene Gloss, [place] for Infectious Disease [place] [email or web address] [phone] pager   [phone] cell 04/29/2014, 10:32 AM

## 2014-04-29 NOTE — Progress Notes (Signed)
Name: [name] [name] MRN: [id number] DOB: [birth year]    ADMISSION DATE:  04/25/2014 CONSULTATION DATE:  04/26/14  REFERRING MD :  Dr. Charlies Silvers   CHIEF COMPLAINT:  Cavitary Lesion on CT Chest     HISTORY OF PRESENT ILLNESS:  53 y/o F with a PMH of UC, GERD, HTN, Hep C, Substance abuse & HIV (reported new diagnosis during recent [facility] admission)  who presented to [facility] ER on 11/9 with a 3 week hx of weakness, non-productive cough, lower abdominal pain, rectal bleeding and decreased appetite.  She reports she has lost approximately 32 lbs in the last month.  The patient was recently admitted to [facility] for an ulcerative colitis flare and followed up at [facility] on 11/2 with an NP on 11/3 and was instructed to set up an appointment with the HIV clinic in [facility].  The patient did not follow up with the clinic because of transportation issues.  She previously had been on sulfasalazine which reportedly helped with bloody stools but she quit taking due to nausea / vomiting.  At the 11/3 visit, her home lasix was discontinued due to tachycardia and hypotension.    She currently reports she was diagnosed with HIV but her family is not aware and she is very concerned that they will find out about the diagnosis.  The patient reports she never injected IV drugs.  Her drug of choice was crack and marijuana.  She used to be a fairly heavy crack abuser, infrequent ETOH.  She denies being sexually active.  She denies smoking cigarettes.  Patient reports current sensation of shortness of breath, dry non-productive cough, intermittent fevers, R posterior chest pain, decreased appetite, nausea/vomiting, occasional swelling of lower extremities.  She denies skin lesions, hemoptysis.  Indicates multiple broken and decaying teeth, has not seen a dentist.  Former exposure (29 years ago) of TB - her father-in-law had TB and was treated during her pregnancy with her daughter.    SIGNIFICANT EVENTS  11/10  Admit with abd pain,  wt loss, vomiting, hypotension, hypoxia and incidental finding of cavitary lung lesions.    STUDIES:  11/10  CT Chest >> multi-focal bilateral cavitary lesions with surrounding ground glass, no sig adenopathy, multi-vessel CAD   SUBJECTIVE: afebrile Denies CP/ dyspnea/hemoptysis C/ o Coughing  C/o pain not relieved by current meds  VITAL SIGNS: Temp:  [98 F (36.7 C)-98.9 F (37.2 C)] 98.7 F (37.1 C) (11/13 0525) Pulse Rate:  [64-78] 64 (11/13 0525) Resp:  [16-18] 18 (11/13 0525) BP: (122-130)/(67-75) 130/67 mmHg (11/13 0525) SpO2:  [100 %] 100 % (11/13 0525)  PHYSICAL EXAMINATION: General:  Cachectic female in NAD  Neuro:  AAOx4, speech clear, MAE  HEENT:  Mm pink/moist, poor dentition, dental carries / broken teeth Cardiovascular:  s1s2 rrr, no m/r/g Lungs:  resp's even/non-labored, lungs bilaterally clear Abdomen:  Flat, soft, bsx4 active  Musculoskeletal:  No acute deformities  Skin:  Warm/dry, no edema    Recent Labs Lab 04/25/14 1221 04/26/14 0347 04/27/14 0408  NA 131* 135* 136*  K 3.3* 3.7 3.2*  CL 91* 103 106  CO2 28 24 22   BUN 17 12 11   CREATININE 0.80 0.55 0.49*  GLUCOSE 162* 157* 174*    Recent Labs Lab 04/26/14 0347 04/27/14 0408 04/28/14 0510  HGB 7.5* 8.1* 8.8*  HCT 21.5* 23.4* 25.0*  WBC 5.2 4.4 6.6  PLT 595* 590* 600*   No results found.  ASSESSMENT / PLAN:   Bilateral Cavitary Lesions - ddx  includes lung abscess due to chronic microaspiration of oral flora with poor dentition, TB, aspiration and opportunistic infection  -staph  TTE -no vegetation , BAL for PCP neg,  quantiferon gold indet Former Heavy Crack Abuse (smoking) Severe Protein Calorie Malnutrition  Hep C+  Anemia  AIDS with  CD4- 140 -   Plan:  - would treat s lung abscess with prolonged abx (4-6 wks depending on CXR resolution) -restart vanc while awaiting ID of staph -will need FU imaging every 1-2 wks to resolution She has not disclosed diagnosis to daughter  but agrees to do so. -Defer adjustment of pain meds to [facility]  PCCM to sign off - FU ccan be with ID since she will follow with them for HIV, If does not resolve on FU imaging, we can see her as outpt  Kara Mead MD. Shade Flood. [place] [facility] Pager [phone] If no response call [phone]    04/29/2014, 10:34 AM

## 2014-04-29 NOTE — Progress Notes (Addendum)
Patient ID: [name] [name], female   DOB: [birth year], 53 y.o.   MRN: [id number] [facility] PROGRESS NOTE  Lyllian Gause IOE:[id number] DOB: 1961-04-22 DOA: 04/25/2014 PCP: No primary care provider on file.  Brief narrative: 53 y.o. female with known history of IVDU, reently diagnosed with HIV (not seen by ID specialist and not started on HAART), who presented with 2 week history of lower abdominal pain, vomiting, weight loss, and diminished appetite, BRBPR. Recently hospitalized at [facility] for UC flare where she was treated with solumedrol and was discharged with script for Prednisone and Sulfasalazine but pt explains she was unable to get it.  Work up in the ED revealed cavitary lesions in lower lobes on CT abd/pelvis, a CXR also notable for cavitary lesion in the RUL, as well as findings of PNA in left lower lobe. There is no mention of cavitary lesions on her [place] H&P nor discharge summary and patient reports that she did not have a CXR during that admit. Patient underwent bronchoscopy 04/27/2014 with brushings & transbronchial biopsies x 3 obtained from RUL cavitary lesion. Cavitary lesions growing staph aureus and per ID TEE is recommended as it will change the treatment if positive.   Assessment/Plan:    Principal Problem:  Acute hypoxic respiratory failure / HCAP / cavitary lesions growing staph aureus from bronchoscopy - likely multifactorial secondary to bilateral cavitary lesions which are growing staph aureus from bronchoscopy. Tuberculosis ruled out. - patient underwent bronchoscopy and over 04/27/2014 with brushings and transbronchial biopsies obtained from the right upper lung cavitary lesion which as mentioned are growing staph aureus. Fungal culture from bronchoscopy shows no yeast or fungal elements. As mentioned, AFB 3 smears are negative - Per pulmonary, recommendation to treat as lung abscess with prolonged antibiotics and imaging study every 1-2 weeks to  monitor for resolution. - Per infectious disease, Linezolid was started. Recommendation is also for TEE which would change treatment regimen if it turns out to be positive. - Blood cultures so far negative but there is still high suspicious for possible endocarditis considering patient's history of IV drug use. - pneumocystis negative  Active Problems:  Hypotension - blood pressure now stable 130/67.  Anemia of chronic disease, likely HIV - drop in hg since admission possibly dilutional from IVF pt has been receiving since admission - no signs of active bleeding, transfused one unit of blood (11/10) - posttransfusion Hgb 8.1 --> 8.8  Thrombocytosis - possibly secondary to an inflammatory process  Hyponatremia - likely pre renal from poor oral intake - Na trending up after receiving IVF: 131 --> 136  Hypokalemia  - likely from lasix  - supplemented - follow-up BMP in the morning  Abdominal pain / diarrhea  - from underlying UC imposed on new infection as noted above - currently on prednisone and sulfasalazine  - check stool for C. Difficile - pending  HIV positive - ID consulted, appreciate assistance - CD4 140; we'll see with infectious disease when antiretroviral regimen will start. Most likely an outpatient basis. - Continue Bactrim for PCP for prophylaxis  Severe PCM - likely from underlying infection - advance diet when pt able to tolerate   DVT Prophylaxis:   Heparin SQ  Code Status: Full.  Family Communication: plan of care discussed with the patient Disposition Plan: Home when stable.     IV access:   Peripheral IV  Procedures and diagnostic studies:    Dg Chest 2 View 04/25/2014 Prominent cavitary infiltrate right upper lobe. These findings  are most likely infectious. Active granulomatous disease cannot be excluded. Pulmonary abscess cannot be excluded. Patchy infiltrates left mid lung field a left lower lobe consistent with  pneumonia. Close follow-up chest x-rays are suggested to demonstrate clearing of these findings. Underlying tumor particularly in the right upper lobe cannot be excluded.   Ct Chest Wo Contrast 04/26/2014 Examination is positive for multi focal, bilateral cavitary lesions with surrounding allowing glass attenuation. In a patient who is immunocompromised findings are concerning for infection including pulmonary tuberculosis, septic emboli and atypical infections. Certain malignancy such as squamous cell carcinoma may also cavitate. No significant mediastinal or hilar adenopathy. Atherosclerotic disease including multi vessel coronary artery calcification.   Ct Abdomen Pelvis W Contrast 04/25/2014 Numerous cavitary lesions at the lung bases with considerations including inflammatory and infectious processes. Vasculitis can have a similar appearance but is felt to be less likely. Significant wall thickening of the entire colon, most notable in the distal aspect, favored to be infectious. Inflammatory causes could have a similar appearance. Periportal and retroperitoneal adenopathy. No abscess or bowel obstruction.  Bronchoscopy 04/27/2014 Brushings & transbronchial biopsies x 3 were obtained from RUL cavitary lesion  Dg Chest [facility] 04/27/2014 No pneumothorax post bronchoscopy. Persisting cavitary lesion in the RIGHT upper lobe and mild scattered infiltrates, favor infectious.  Medical Consultants:   Infectious disease (Dr. Dominga Ferry COmer)  Pulmonary  Other Consultants:   Nutrition  IAnti-Infectives:    Vancomycin 11/09 --> 04/26/2014   Maxipime 11/09 --> 04/26/2014  Bactrim 04/26/2014 -->  Linezolid 04/29/2014 -->  Leisa Lenz, MD  [facility] Pager [phone]  If 7PM-7AM, please contact night-coverage [email or web address] Password [facility] 04/29/2014, 12:35 PM   LOS: 4 days    HPI/Subjective: No acute overnight events.  Objective: Filed Vitals:    04/28/14 0531 04/28/14 1500 04/28/14 2055 04/29/14 0525  BP: 118/69 122/68 130/75 130/67  Pulse: 79 78 71 64  Temp: 98.6 F (37 C) 98.9 F (37.2 C) 98 F (36.7 C) 98.7 F (37.1 C)  TempSrc: Oral Oral Oral Oral  Resp: 22 18 16 18   Height:      Weight:      SpO2: 100% 100% 100% 100%    Intake/Output Summary (Last 24 hours) at 04/29/14 1235 Last data filed at 04/29/14 0600  Gross per 24 hour  Intake    240 ml  Output   1100 ml  Net   -860 ml    Exam:   General:  Pt is alert, follows commands appropriately, not in acute distress  Cardiovascular: Regular rate and rhythm, S1/S2, no murmurs  Respiratory: Clear to auscultation bilaterally, no wheezing, no crackles, no rhonchi  Abdomen: Soft, non distended, bowel sounds present  Extremities: No edema, pulses DP and PT palpable bilaterally  Neuro: Grossly nonfocal  Data Reviewed: Basic Metabolic Panel:  Recent Labs Lab 04/25/14 1221 04/26/14 0347 04/27/14 0408  NA 131* 135* 136*  K 3.3* 3.7 3.2*  CL 91* 103 106  CO2 28 24 22   GLUCOSE 162* 157* 174*  BUN 17 12 11   CREATININE 0.80 0.55 0.49*  CALCIUM 9.0 8.2* 8.4   Liver Function Tests:  Recent Labs Lab 04/25/14 1221  AST 19  ALT 8  ALKPHOS 377*  BILITOT 0.4  PROT 8.8*  ALBUMIN 2.0*    Recent Labs Lab 04/25/14 1221  LIPASE 55   No results for input(s): AMMONIA in the last 168 hours. CBC:  Recent Labs Lab 04/25/14 1221 04/26/14 0347 04/27/14 0408 04/28/14 0510  WBC 9.3  5.2 4.4 6.6  NEUTROABS 4.7  --   --   --   HGB 9.3* 7.5* 8.1* 8.8*  HCT 25.8* 21.5* 23.4* 25.0*  MCV 79.9 79.3 81.0 82.5  PLT 797* 595* 590* 600*   Cardiac Enzymes: No results for input(s): CKTOTAL, CKMB, CKMBINDEX, TROPONINI in the last 168 hours. BNP: Invalid input(s): POCBNP CBG: No results for input(s): GLUCAP in the last 168 hours.  Recent Results (from the past 240 hour(s))  Culture, blood (routine x 2)     Status: None (Preliminary result)   Collection Time:  04/25/14  7:38 PM  Result Value Ref Range Status   Specimen Description BLOOD RIGHT ARM  Final   Special Requests BOTTLES DRAWN AEROBIC AND ANAEROBIC 10CC  Final   Culture  Setup Time   Final    04/26/2014 01:22 Performed at [facility]    Culture   Final           BLOOD CULTURE RECEIVED NO GROWTH TO DATE CULTURE WILL BE HELD FOR 5 DAYS BEFORE ISSUING A FINAL NEGATIVE REPORT Performed at [facility]    Report Status PENDING  Incomplete  Culture, blood (routine x 2)     Status: None (Preliminary result)   Collection Time: 04/25/14  7:48 PM  Result Value Ref Range Status   Specimen Description BLOOD RIGHT HAND  Final   Special Requests BOTTLES DRAWN AEROBIC AND ANAEROBIC 10CC  Final   Culture  Setup Time   Final    04/26/2014 01:22 Performed at [facility]    Culture   Final           BLOOD CULTURE RECEIVED NO GROWTH TO DATE CULTURE WILL BE HELD FOR 5 DAYS BEFORE ISSUING A FINAL NEGATIVE REPORT Performed at [facility]    Report Status PENDING  Incomplete  AFB culture with smear     Status: None (Preliminary result)   Collection Time: 04/26/14  5:10 PM  Result Value Ref Range Status   Specimen Description SPUTUM  Final   Special Requests Immunocompromised  Final   Acid Fast Smear   Final    NO ACID FAST BACILLI SEEN Performed at [facility]    Culture   Final    CULTURE WILL BE EXAMINED FOR 6 WEEKS BEFORE ISSUING A FINAL REPORT Performed at [facility]    Report Status PENDING  Incomplete  Culture, expectorated sputum-assessment     Status: None   Collection Time: 04/26/14  5:11 PM  Result Value Ref Range Status   Specimen Description SPUTUM  Final   Special Requests Immunocompromised  Final   Sputum evaluation   Final    THIS SPECIMEN IS ACCEPTABLE. RESPIRATORY CULTURE REPORT TO FOLLOW.   Report Status 04/26/2014 FINAL  Final  Culture, respiratory (NON-Expectorated)     Status: None   Collection Time: 04/26/14   5:11 PM  Result Value Ref Range Status   Specimen Description SPUTUM  Final   Special Requests NONE  Final   Gram Stain   Final    NO WBC SEEN RARE SQUAMOUS EPITHELIAL CELLS PRESENT RARE GRAM POSITIVE COCCI IN PAIRS RARE GRAM NEGATIVE RODS RARE GRAM NEGATIVE COCCI    Culture   Final    NORMAL OROPHARYNGEAL FLORA Performed at [facility]    Report Status 04/28/2014 FINAL  Final  AFB culture with smear     Status: None (Preliminary result)   Collection Time: 04/27/14  9:10 AM  Result Value  Ref Range Status   Specimen Description BRONCHIAL ALVEOLAR LAVAGE  Final   Special Requests Immunocompromised  Final   Acid Fast Smear   Final    NO ACID FAST BACILLI SEEN Performed at [facility]    Culture   Final    CULTURE WILL BE EXAMINED FOR 6 WEEKS BEFORE ISSUING A FINAL REPORT Performed at [facility]    Report Status PENDING  Incomplete  Culture, bal-quantitative     Status: None (Preliminary result)   Collection Time: 04/27/14  9:10 AM  Result Value Ref Range Status   Specimen Description BRONCHIAL ALVEOLAR LAVAGE  Final   Special Requests NONE  Final   Gram Stain   Final    ABUNDANT WBC PRESENT, PREDOMINANTLY PMN RARE SQUAMOUS EPITHELIAL CELLS PRESENT FEW GRAM POSITIVE COCCI IN PAIRS IN CHAINS IN CLUSTERS Performed at [facility]   Final    >=100,000 COLONIES/ML Performed at [facility]    Culture   Final    STAPHYLOCOCCUS AUREUS Note: RIFAMPIN AND GENTAMICIN SHOULD NOT BE USED AS SINGLE DRUGS FOR TREATMENT OF STAPH INFECTIONS. Performed at [facility]    Report Status PENDING  Incomplete  Fungus Culture with Smear     Status: None (Preliminary result)   Collection Time: 04/27/14  9:10 AM  Result Value Ref Range Status   Specimen Description BRONCHIAL ALVEOLAR LAVAGE  Final   Special Requests NONE  Final   Fungal Smear   Final    NO YEAST OR FUNGAL ELEMENTS SEEN Performed at [facility]    Culture   Final    CULTURE IN PROGRESS FOR FOUR WEEKS Performed at [facility]    Report Status PENDING  Incomplete  Pneumocystis smear by DFA     Status: None   Collection Time: 04/27/14  9:10 AM  Result Value Ref Range Status   Specimen Source-PJSRC BRONCHIAL ALVEOLAR LAVAGE  Final   Pneumocystis jiroveci Ag NEGATIVE  Final    Comment: Performed at [facility]  AFB culture with smear     Status: None (Preliminary result)   Collection Time: 04/27/14  9:10 AM  Result Value Ref Range Status   Specimen Description BIOPSY LUNG  Final   Special Requests NONE  Final   Acid Fast Smear   Final    NO ACID FAST BACILLI SEEN Performed at [facility]    Culture   Final    CULTURE WILL BE EXAMINED FOR 6 WEEKS BEFORE ISSUING A FINAL REPORT Performed at [facility]    Report Status PENDING  Incomplete     Scheduled Meds: . butamben-tetracaine-benzocaine  1 spray Topical Once  . feeding supplement (ENSURE COMPLETE)  237 mL Oral TID BM  . feeding supplement (RESOURCE BREEZE)  1 Container Oral TID BM  . heparin  5,000 Units Subcutaneous 3 times per day  . lidocaine  1 application Topical Once  . linezolid  600 mg Oral Q12H  . pantoprazole  40 mg Oral Daily  . predniSONE  20 mg Oral BID WC  . sulfamethoxazole-trimethoprim  1 tablet Oral Daily  . sulfaSALAzine  1,000 mg Oral BID  . zolpidem  5 mg Oral QHS   Continuous Infusions: . sodium chloride 75 mL/hr at 04/28/14 1847

## 2014-04-29 NOTE — Plan of Care (Signed)
Problem: Phase I Progression Outcomes Goal: Initial discharge plan identified Outcome: Completed/Met Date Met:  04/29/14  Problem: Phase II Progression Outcomes Goal: Progress activity as tolerated unless otherwise ordered Outcome: Progressing Goal: Discharge plan established Outcome: Progressing Goal: Vital signs remain stable Outcome: Completed/Met Date Met:  04/29/14     

## 2014-04-29 NOTE — Progress Notes (Signed)
Cardiology will schedule TEE for Monday, 05/02/2014.  Leisa Lenz [facility] [phone]

## 2014-04-30 DIAGNOSIS — K51918 Ulcerative colitis, unspecified with other complication: Secondary | ICD-10-CM

## 2014-04-30 LAB — CULTURE, BAL-QUANTITATIVE W GRAM STAIN

## 2014-04-30 LAB — CULTURE, BAL-QUANTITATIVE

## 2014-04-30 MED ORDER — CHLORHEXIDINE GLUCONATE CLOTH 2 % EX PADS
6.0000 | MEDICATED_PAD | Freq: Every day | CUTANEOUS | Status: DC
  Administered 2014-05-01 – 2014-05-02 (×2): 6 via TOPICAL

## 2014-04-30 MED ORDER — MUPIROCIN 2 % EX OINT
1.0000 "application " | TOPICAL_OINTMENT | Freq: Two times a day (BID) | CUTANEOUS | Status: DC
  Administered 2014-04-30 – 2014-05-03 (×7): 1 via NASAL
  Filled 2014-04-30: qty 22

## 2014-04-30 NOTE — Progress Notes (Addendum)
Patient ID: [name] [name], female   DOB: 1960-08-20, 53 y.o.   MRN: [id number] [facility] PROGRESS NOTE  [name] [name] BDZ:[id number] DOB: 04-14-1961 DOA: 04/25/2014 PCP: No primary care provider on file.  Brief narrative: 53 y.o. female with known history of IVDU, reently diagnosed with HIV (not seen by ID specialist and not started on HAART), who presented with 2 week history of lower abdominal pain, vomiting, weight loss, and diminished appetite, BRBPR. Recently hospitalized at [facility] for UC flare where she was treated with solumedrol and was discharged with script for Prednisone and Sulfasalazine but pt explains she was unable to get it.  Work up in the ED revealed cavitary lesions in lower lobes on CT abd/pelvis, a CXR also notable for cavitary lesion in the RUL, as well as findings of PNA in left lower lobe. There is no mention of cavitary lesions on her [place] H&P nor discharge summary and patient reports that she did not have a CXR during that admit. Patient underwent bronchoscopy 04/27/2014 with brushings & transbronchial biopsies x 3 obtained from RUL cavitary lesion. Cavitary lesions growing staph aureus and per ID TEE is recommended as it will change the treatment if positive.   Assessment/Plan:    Principal Problem:  Acute hypoxic respiratory failure / HCAP / cavitary lesions growing staph aureus from bronchoscopy - likely multifactorial secondary to bilateral cavitary lesions which are growing staph aureus from bronchoscopy.  - patient underwent bronchoscopy and over 04/27/2014 with brushings and transbronchial biopsies obtained from the right upper lung cavitary lesion which as mentioned are growing staph aureus. Fungal culture from bronchoscopy shows no yeast or fungal elements. AFB 3 smears are negative. We discontinued airborne precaution. - Per pulmonary, recommendation to treat as lung abscess with prolonged antibiotics and imaging study (CXR) every 1-2  weeks to monitor for resolution. - Per infectious disease, linezolid was started. Recommendation is also for TEE which would change treatment regimen if it turns out to be positive. Cardiology confirmed they will schedule it for Monday.  - Blood cultures so far negative but there is still high suspicious for possible endocarditis considering patient's history of IV drug use. - pneumocystis negative  Active Problems:  Hypotension - blood pressure stable  Anemia of chronic disease, likely HIV - drop in hg since admission possibly dilutional from IVF pt has been receiving since admission - no signs of active bleeding, transfused one unit of blood (11/10) - posttransfusion Hgb stable, 8.1 --> 8.8 - follow up CBC in am  Thrombocytosis - secondary to an inflammatory process  Hyponatremia - likely pre renal from poor oral intake - Na trending up after receiving IVF: 131 --> 136 - follow up BMP in am  Hypokalemia  - likely from lasix  - supplemented - follow-up BMP tomorrow morning   Abdominal pain / diarrhea  - from underlying UC imposed on new infection as noted above - currently on prednisone and sulfasalazine  - has more formed stools, C.diff not analyzed.  HIV positive - ID consulted, appreciate assistance - CD4 140 - Continue Bactrim for PCP for prophylaxis  Severe PCM - likely from underlying infection - diet as tolerated   DVT Prophylaxis:   Heparin SQ ordered   Code Status: Full.  Family Communication: plan of care discussed with the patient Disposition Plan: Home when stable. Will need f/u appt with ID clinic, needs [place] appt    IV access:   Peripheral IV  Procedures and diagnostic studies:    Dg  Chest 2 View 04/25/2014 Prominent cavitary infiltrate right upper lobe. These findings are most likely infectious. Active granulomatous disease cannot be excluded. Pulmonary abscess cannot be excluded. Patchy infiltrates left mid lung field a  left lower lobe consistent with pneumonia. Close follow-up chest x-rays are suggested to demonstrate clearing of these findings. Underlying tumor particularly in the right upper lobe cannot be excluded.   Ct Chest Wo Contrast 04/26/2014 Examination is positive for multi focal, bilateral cavitary lesions with surrounding allowing glass attenuation. In a patient who is immunocompromised findings are concerning for infection including pulmonary tuberculosis, septic emboli and atypical infections. Certain malignancy such as squamous cell carcinoma may also cavitate. No significant mediastinal or hilar adenopathy. Atherosclerotic disease including multi vessel coronary artery calcification.   Ct Abdomen Pelvis W Contrast 04/25/2014 Numerous cavitary lesions at the lung bases with considerations including inflammatory and infectious processes. Vasculitis can have a similar appearance but is felt to be less likely. Significant wall thickening of the entire colon, most notable in the distal aspect, favored to be infectious. Inflammatory causes could have a similar appearance. Periportal and retroperitoneal adenopathy. No abscess or bowel obstruction.  Bronchoscopy 04/27/2014 Brushings & transbronchial biopsies x 3 were obtained from RUL cavitary lesion  Dg Chest [facility] 04/27/2014 No pneumothorax post bronchoscopy. Persisting cavitary lesion in the RIGHT upper lobe and mild scattered infiltrates, favor infectious.  Medical Consultants:   Infectious disease (Dr. Dominga Ferry COmer)  Pulmonary  Other Consultants:   Nutrition  IAnti-Infectives:    Vancomycin 11/09 --> 04/26/2014   Maxipime 11/09 --> 04/26/2014  Bactrim 04/26/2014 -->  Linezolid 04/29/2014 -->   Leisa Lenz, MD  [facility] Pager [phone]  If 7PM-7AM, please contact night-coverage [email or web address] Password [email or web address] 04/30/2014, 11:49 AM   LOS: 5 days    HPI/Subjective: No acute overnight  events.  Objective: Filed Vitals:   04/29/14 1500 04/29/14 2155 04/30/14 0039 04/30/14 0613  BP: 145/76 133/71  150/73  Pulse: 85 82  79  Temp: 99.5 F (37.5 C) 98.2 F (36.8 C) 97.7 F (36.5 C) 98.4 F (36.9 C)  TempSrc: Oral Oral Oral Oral  Resp: 16 16  16   Height:      Weight:      SpO2: 100% 100%  99%    Intake/Output Summary (Last 24 hours) at 04/30/14 1149 Last data filed at 04/30/14 0900  Gross per 24 hour  Intake   1425 ml  Output    300 ml  Net   1125 ml    Exam:   General:  Pt is alert, follows commands appropriately, not in acute distress  Cardiovascular: Regular rate and rhythm, S1/S2, no murmurs  Respiratory: Clear to auscultation bilaterally, no wheezing, no crackles, no rhonchi  Abdomen: Soft, non tender, non distended, bowel sounds present  Extremities: No edema, pulses DP and PT palpable bilaterally  Neuro: Grossly nonfocal  Data Reviewed: Basic Metabolic Panel:  Recent Labs Lab 04/25/14 1221 04/26/14 0347 04/27/14 0408  NA 131* 135* 136*  K 3.3* 3.7 3.2*  CL 91* 103 106  CO2 28 24 22   GLUCOSE 162* 157* 174*  BUN 17 12 11   CREATININE 0.80 0.55 0.49*  CALCIUM 9.0 8.2* 8.4   Liver Function Tests:  Recent Labs Lab 04/25/14 1221  AST 19  ALT 8  ALKPHOS 377*  BILITOT 0.4  PROT 8.8*  ALBUMIN 2.0*    Recent Labs Lab 04/25/14 1221  LIPASE 55   No results for input(s): AMMONIA in the last 168 hours. CBC:  Recent Labs Lab 04/25/14 1221 04/26/14 0347 04/27/14 0408 04/28/14 0510  WBC 9.3 5.2 4.4 6.6  NEUTROABS 4.7  --   --   --   HGB 9.3* 7.5* 8.1* 8.8*  HCT 25.8* 21.5* 23.4* 25.0*  MCV 79.9 79.3 81.0 82.5  PLT 797* 595* 590* 600*   Cardiac Enzymes: No results for input(s): CKTOTAL, CKMB, CKMBINDEX, TROPONINI in the last 168 hours. BNP: Invalid input(s): POCBNP CBG: No results for input(s): GLUCAP in the last 168 hours.  Recent Results (from the past 240 hour(s))  Culture, blood (routine x 2)     Status: None  (Preliminary result)   Collection Time: 04/25/14  7:38 PM  Result Value Ref Range Status   Specimen Description BLOOD RIGHT ARM  Final   Special Requests BOTTLES DRAWN AEROBIC AND ANAEROBIC 10CC  Final   Culture  Setup Time   Final    04/26/2014 01:22 Performed at [facility]    Culture   Final           BLOOD CULTURE RECEIVED NO GROWTH TO DATE CULTURE WILL BE HELD FOR 5 DAYS BEFORE ISSUING A FINAL NEGATIVE REPORT Performed at [facility]    Report Status PENDING  Incomplete  Culture, blood (routine x 2)     Status: None (Preliminary result)   Collection Time: 04/25/14  7:48 PM  Result Value Ref Range Status   Specimen Description BLOOD RIGHT HAND  Final   Special Requests BOTTLES DRAWN AEROBIC AND ANAEROBIC 10CC  Final   Culture  Setup Time   Final    04/26/2014 01:22 Performed at [facility]    Culture   Final           BLOOD CULTURE RECEIVED NO GROWTH TO DATE CULTURE WILL BE HELD FOR 5 DAYS BEFORE ISSUING A FINAL NEGATIVE REPORT Performed at [facility]    Report Status PENDING  Incomplete  AFB culture with smear     Status: None (Preliminary result)   Collection Time: 04/26/14  5:10 PM  Result Value Ref Range Status   Specimen Description SPUTUM  Final   Special Requests Immunocompromised  Final   Acid Fast Smear   Final    NO ACID FAST BACILLI SEEN Performed at [facility]    Culture   Final    CULTURE WILL BE EXAMINED FOR 6 WEEKS BEFORE ISSUING A FINAL REPORT Performed at [facility]    Report Status PENDING  Incomplete  Culture, expectorated sputum-assessment     Status: None   Collection Time: 04/26/14  5:11 PM  Result Value Ref Range Status   Specimen Description SPUTUM  Final   Special Requests Immunocompromised  Final   Sputum evaluation   Final    THIS SPECIMEN IS ACCEPTABLE. RESPIRATORY CULTURE REPORT TO FOLLOW.   Report Status 04/26/2014 FINAL  Final  Culture, respiratory (NON-Expectorated)      Status: None   Collection Time: 04/26/14  5:11 PM  Result Value Ref Range Status   Specimen Description SPUTUM  Final   Special Requests NONE  Final   Gram Stain   Final    NO WBC SEEN RARE SQUAMOUS EPITHELIAL CELLS PRESENT RARE GRAM POSITIVE COCCI IN PAIRS RARE GRAM NEGATIVE RODS RARE GRAM NEGATIVE COCCI    Culture   Final    NORMAL OROPHARYNGEAL FLORA Performed at [facility]    Report Status 04/28/2014 FINAL  Final  AFB culture with smear     Status:  None (Preliminary result)   Collection Time: 04/27/14  9:10 AM  Result Value Ref Range Status   Specimen Description BRONCHIAL ALVEOLAR LAVAGE  Final   Special Requests Immunocompromised  Final   Acid Fast Smear   Final    NO ACID FAST BACILLI SEEN Performed at [facility]    Culture   Final    CULTURE WILL BE EXAMINED FOR 6 WEEKS BEFORE ISSUING A FINAL REPORT Performed at [facility]    Report Status PENDING  Incomplete  Culture, bal-quantitative     Status: None (Preliminary result)   Collection Time: 04/27/14  9:10 AM  Result Value Ref Range Status   Specimen Description BRONCHIAL ALVEOLAR LAVAGE  Final   Special Requests NONE  Final   Gram Stain   Final    ABUNDANT WBC PRESENT, PREDOMINANTLY PMN RARE SQUAMOUS EPITHELIAL CELLS PRESENT FEW GRAM POSITIVE COCCI IN PAIRS IN CHAINS IN CLUSTERS Performed at [place]   Final    >=100,000 COLONIES/ML Performed at [facility]    Culture   Final    STAPHYLOCOCCUS AUREUS Note: RIFAMPIN AND GENTAMICIN SHOULD NOT BE USED AS SINGLE DRUGS FOR TREATMENT OF STAPH INFECTIONS. Performed at [facility]    Report Status PENDING  Incomplete  Fungus Culture with Smear     Status: None (Preliminary result)   Collection Time: 04/27/14  9:10 AM  Result Value Ref Range Status   Specimen Description BRONCHIAL ALVEOLAR LAVAGE  Final   Special Requests NONE  Final   Fungal Smear   Final    NO YEAST OR FUNGAL  ELEMENTS SEEN Performed at [facility]    Culture   Final    CULTURE IN PROGRESS FOR FOUR WEEKS Performed at [facility]    Report Status PENDING  Incomplete  Pneumocystis smear by DFA     Status: None   Collection Time: 04/27/14  9:10 AM  Result Value Ref Range Status   Specimen Source-PJSRC BRONCHIAL ALVEOLAR LAVAGE  Final   Pneumocystis jiroveci Ag NEGATIVE  Final    Comment: Performed at [facility]  AFB culture with smear     Status: None (Preliminary result)   Collection Time: 04/27/14  9:10 AM  Result Value Ref Range Status   Specimen Description BIOPSY LUNG  Final   Special Requests NONE  Final   Acid Fast Smear   Final    NO ACID FAST BACILLI SEEN Performed at [facility]    Culture   Final    CULTURE WILL BE EXAMINED FOR 6 WEEKS BEFORE ISSUING A FINAL REPORT Performed at [facility]    Report Status PENDING  Incomplete     Scheduled Meds: . butamben-tetracaine-benzocaine  1 spray Topical Once  . feeding supplement (ENSURE COMPLETE)  237 mL Oral TID BM  . feeding supplement (RESOURCE BREEZE)  1 Container Oral TID BM  . heparin  5,000 Units Subcutaneous 3 times per day  . lidocaine  1 application Topical Once  . linezolid  600 mg Oral Q12H  . pantoprazole  40 mg Oral Daily  . predniSONE  20 mg Oral BID WC  . sulfamethoxazole-trimethoprim  1 tablet Oral Daily  . sulfaSALAzine  1,000 mg Oral BID  . zolpidem  5 mg Oral QHS   Continuous Infusions: . sodium chloride 75 mL/hr at 04/30/14 0735

## 2014-04-30 NOTE — Progress Notes (Signed)
Dr Charlies Silvers notified of positive MRSA from Bronch lavage. Will continue to monitor.

## 2014-05-01 LAB — CBC
HEMATOCRIT: 26.1 % — AB (ref 36.0–46.0)
Hemoglobin: 8.9 g/dL — ABNORMAL LOW (ref 12.0–15.0)
MCH: 27.9 pg (ref 26.0–34.0)
MCHC: 34.1 g/dL (ref 30.0–36.0)
MCV: 81.8 fL (ref 78.0–100.0)
Platelets: 564 10*3/uL — ABNORMAL HIGH (ref 150–400)
RBC: 3.19 MIL/uL — AB (ref 3.87–5.11)
RDW: 15.3 % (ref 11.5–15.5)
WBC: 6.6 10*3/uL (ref 4.0–10.5)

## 2014-05-01 LAB — BASIC METABOLIC PANEL
Anion gap: 10 (ref 5–15)
BUN: 11 mg/dL (ref 6–23)
CO2: 23 meq/L (ref 19–32)
CREATININE: 0.57 mg/dL (ref 0.50–1.10)
Calcium: 8.4 mg/dL (ref 8.4–10.5)
Chloride: 104 mEq/L (ref 96–112)
GFR calc Af Amer: >90 mL/min (ref >90)
GFR calc non Af Amer: >90 mL/min (ref >90)
Glucose, Bld: 117 mg/dL — ABNORMAL HIGH (ref 70–99)
Potassium: 4.4 mEq/L (ref 3.7–5.3)
Sodium: 137 mEq/L (ref 137–147)

## 2014-05-01 NOTE — Progress Notes (Signed)
[facility] called and transportation arranged for transport to [facility] for TEE in Endo, scheduled time for procedure 1400-1500. Will pick up at 1230

## 2014-05-01 NOTE — Plan of Care (Signed)
Problem: Phase II Progression Outcomes Goal: Progress activity as tolerated unless otherwise ordered Outcome: Progressing  Problem: Discharge Progression Outcomes Goal: Pain controlled with appropriate interventions Outcome: Progressing

## 2014-05-01 NOTE — Progress Notes (Signed)
Patient ID: [name] [name], female   DOB: Jul 15, 1960, 53 y.o.   MRN: [id number] [facility] PROGRESS NOTE  [name] [name] PTW:[id number] DOB: [birth year] DOA: 04/25/2014 PCP: No primary care provider on file.  Brief narrative: 53 y.o. female with known history of IVDU, reently diagnosed with HIV (not seen by ID specialist and not started on HAART), who presented with 2 week history of lower abdominal pain, vomiting, weight loss, and diminished appetite, BRBPR. Recently hospitalized at [facility] for UC flare where she was treated with solumedrol and was discharged with script for Prednisone and Sulfasalazine but pt explains she was unable to get it.  Work up in the ED revealed cavitary lesions in lower lobes on CT abd/pelvis, a CXR also notable for cavitary lesion in the RUL, as well as findings of PNA in left lower lobe. There is no mention of cavitary lesions on her [place] H&P nor discharge summary and patient reports that she did not have a CXR during that admit. Patient underwent bronchoscopy 04/27/2014 with brushings & transbronchial biopsies x 3 obtained from RUL cavitary lesion. Cavitary lesions growing staph aureus and per ID TEE is recommended as it will change the treatment if positive.   Assessment/Plan:    Principal Problem:  Acute hypoxic respiratory failure / HCAP / cavitary lesions growing staph aureus from bronchoscopy - likely multifactorial secondary to bilateral cavitary lesions which are growing MRSA from bronchoscopy.  - patient underwent bronchoscopy and over 04/27/2014 with brushings and transbronchial biopsies obtained from the right upper lung cavitary lesion which as mentioned are growing MRSA. Fungal culture from bronchoscopy shows no yeast or fungal elements. AFB 3 smears are negative.  - Per pulmonary, recommendation to treat as lung abscess with prolonged antibiotics and imaging study (CXR) every 1-2 weeks to monitor for resolution. - Per infectious  disease, linezolid was started. Recommendation is also for TEE which would change treatment regimen if it turns out to be positive. Cardiology confirmed TEE scheduled for 05/02/2014 at 2 pm. NPO post midnight.  - Blood cultures so far negative but there is still high suspicious for possible endocarditis considering patient's history of IV drug use. -Pneumocystis negative  Active Problems:  Hypotension - BP 150/73. May use hydralazine PRN for better BP control.   Anemia of chronic disease, likely HIV - drop in hg since admission possibly dilutional from IVF pt has been receiving since admission - no signs of active bleeding, transfused one unit of blood (11/10) - posttransfusion Hgb stable, 8.1 --> 8.9  Thrombocytosis - secondary to an inflammatory process  Hyponatremia - likely pre renal from poor oral intake - sodium improved with IV fluids, WNL.  Hypokalemia  - likely from lasix  - supplemented  Abdominal pain / diarrhea  - from underlying UC imposed on new infection as noted above - currently on prednisone and sulfasalazine  - has more formed stools, C.diff not analyzed.  HIV positive - ID consulted, appreciate assistance - CD4 140 - Continue Bactrim for PCP for prophylaxis - ART likely on outpatient basis   Severe PCM - likely from underlying infection - diet as tolerated   DVT Prophylaxis:   Heparin SQ ordered  Code Status: Full.  Family Communication: plan of care discussed with the patient Disposition Plan: Home when stable. Will need f/u appt with ID clinic, needs [facility] appt    IV access:   Peripheral IV  Procedures and diagnostic studies:    Dg Chest 2 View 04/25/2014 Prominent cavitary infiltrate right upper  lobe. These findings are most likely infectious. Active granulomatous disease cannot be excluded. Pulmonary abscess cannot be excluded. Patchy infiltrates left mid lung field a left lower lobe consistent with pneumonia. Close  follow-up chest x-rays are suggested to demonstrate clearing of these findings. Underlying tumor particularly in the right upper lobe cannot be excluded.   Ct Chest Wo Contrast 04/26/2014 Examination is positive for multi focal, bilateral cavitary lesions with surrounding allowing glass attenuation. In a patient who is immunocompromised findings are concerning for infection including pulmonary tuberculosis, septic emboli and atypical infections. Certain malignancy such as squamous cell carcinoma may also cavitate. No significant mediastinal or hilar adenopathy. Atherosclerotic disease including multi vessel coronary artery calcification.   Ct Abdomen Pelvis W Contrast 04/25/2014 Numerous cavitary lesions at the lung bases with considerations including inflammatory and infectious processes. Vasculitis can have a similar appearance but is felt to be less likely. Significant wall thickening of the entire colon, most notable in the distal aspect, favored to be infectious. Inflammatory causes could have a similar appearance. Periportal and retroperitoneal adenopathy. No abscess or bowel obstruction.  Bronchoscopy 04/27/2014 Brushings & transbronchial biopsies x 3 were obtained from RUL cavitary lesion  Dg Chest [facility] 04/27/2014 No pneumothorax post bronchoscopy. Persisting cavitary lesion in the RIGHT upper lobe and mild scattered infiltrates, favor infectious.  Medical Consultants:   Infectious disease (Dr. Dominga Ferry COmer)  Pulmonary  Other Consultants:   Nutrition  IAnti-Infectives:    Vancomycin 11/09 --> 04/26/2014   Maxipime 11/09 --> 04/26/2014  Bactrim 04/26/2014 -->  Linezolid 04/29/2014 -->    Leisa Lenz, MD  [facility] Pager [phone]  If 7PM-7AM, please contact night-coverage [email or web address] Password [facility] 05/01/2014, 9:47 AM   LOS: 6 days    HPI/Subjective: No acute overnight events.  Objective: Filed Vitals:    04/30/14 0613 04/30/14 1410 04/30/14 2101 05/01/14 0647  BP: 150/73 138/82 143/75 150/73  Pulse: 79 110 80 71  Temp: 98.4 F (36.9 C) 99.1 F (37.3 C) 98.4 F (36.9 C) 98.4 F (36.9 C)  TempSrc: Oral Oral Oral Oral  Resp: 16 16 16 16   Height:      Weight:      SpO2: 99% 100% 100% 100%    Intake/Output Summary (Last 24 hours) at 05/01/14 0947 Last data filed at 05/01/14 7262  Gross per 24 hour  Intake 2777.5 ml  Output      0 ml  Net 2777.5 ml    Exam:   General:  Pt is alert, follows commands appropriately, not in acute distress  Cardiovascular: Regular rate and rhythm, S1/S2, no murmurs  Respiratory: no wheezing, no rhonchi  Abdomen: Soft, non tender, non distended, bowel sounds present  Extremities: No edema, pulses DP and PT palpable bilaterally  Neuro: Grossly nonfocal  Data Reviewed: Basic Metabolic Panel:  Recent Labs Lab 04/25/14 1221 04/26/14 0347 04/27/14 0408 05/01/14 0444  NA 131* 135* 136* 137  K 3.3* 3.7 3.2* 4.4  CL 91* 103 106 104  CO2 28 24 22 23   GLUCOSE 162* 157* 174* 117*  BUN 17 12 11 11   CREATININE 0.80 0.55 0.49* 0.57  CALCIUM 9.0 8.2* 8.4 8.4   Liver Function Tests:  Recent Labs Lab 04/25/14 1221  AST 19  ALT 8  ALKPHOS 377*  BILITOT 0.4  PROT 8.8*  ALBUMIN 2.0*    Recent Labs Lab 04/25/14 1221  LIPASE 55   No results for input(s): AMMONIA in the last 168 hours. CBC:  Recent Labs Lab 04/25/14 1221  04/26/14 0347 04/27/14 0408 04/28/14 0510 05/01/14 0444  WBC 9.3 5.2 4.4 6.6 6.6  NEUTROABS 4.7  --   --   --   --   HGB 9.3* 7.5* 8.1* 8.8* 8.9*  HCT 25.8* 21.5* 23.4* 25.0* 26.1*  MCV 79.9 79.3 81.0 82.5 81.8  PLT 797* 595* 590* 600* 564*   Cardiac Enzymes: No results for input(s): CKTOTAL, CKMB, CKMBINDEX, TROPONINI in the last 168 hours. BNP: Invalid input(s): POCBNP CBG: No results for input(s): GLUCAP in the last 168 hours.  Culture, blood (routine x 2)     Status: None (Preliminary result)    Collection Time: 04/25/14  7:38 PM  Result Value Ref Range Status   Specimen Description BLOOD RIGHT ARM  Final   Culture  Setup Time   Final    04/26/2014 01:22 Performed at [facility]    Culture   Final           BLOOD CULTURE RECEIVED NO GROWTH TO DATE    Report Status PENDING  Incomplete  Culture, blood (routine x 2)     Status: None (Preliminary result)   Collection Time: 04/25/14  7:48 PM  Result Value Ref Range Status   Specimen Description BLOOD RIGHT HAND  Final   Culture  Setup Time   Final    04/26/2014 01:22 Performed at [facility]    Culture   Final           BLOOD CULTURE RECEIVED NO GROWTH TO DATE    Report Status PENDING  Incomplete  AFB culture with smear     Status: None (Preliminary result)   Collection Time: 04/26/14  5:10 PM  Result Value Ref Range Status   Specimen Description SPUTUM  Final   Special Requests Immunocompromis  Final   Acid Fast Smear   Final    NO ACID FAST BACILLI SEEN    Culture   Final    CULTURE WILL BE EXAMINED FOR 6 WEEKS BEFORE ISSUING A FINAL REPORT Performed at [facility]    Report Status PENDING  Incomplete  Culture, expectorated sputum-assessment     Status: None   Collection Time: 04/26/14  5:11 PM  Result Value Ref Range Status   Specimen Description SPUTUM  Final   Special Requests Immunocompromise  Final   Sputum evaluation   Final    THIS SPECIMEN IS ACCEPTABLE. RESPIRATORY CULTURE REPORT TO FOLLOW.   Report Status 04/26/2014 FINAL  Final  Culture, respiratory (NON-Expectorated)     Status: None   Collection Time: 04/26/14  5:11 PM  Result Value Ref Range Status   Specimen Description SPUTUM  Final   Special Requests NONE  Final   Gram Stain   Final   Culture   Final    NORMAL OROPHARYNGEAL FLORA   Report Status 04/28/2014 FINAL  Final  AFB culture with smear     Status: None (Preliminary result)   Collection Time: 04/27/14  9:10 AM  Result Value Ref Range Status   Specimen  Description BRONCHIAL ALVEOLAR LAVAGE  Final   Special Requests Immunocompromise  Final   Acid Fast Smear   Final    NO ACID FAST BACILLI SEEN   Culture   Final    CULTURE WILL BE EXAMINED FOR 6 WEEKS BEFORE ISSUING A FINAL REPORT   Report Status PENDING  Incomplete  Culture, bal-quantitative     Status: None   Collection Time: 04/27/14  9:10 AM  Result Value Ref Range Status  Specimen Description BRONCHIAL ALVEOLAR LAVAGE  Final   Special Requests NONE  Final   Gram Stain   Final   Colony Count   Final   Culture   Final    METHICILLIN RESISTANT STAPHYLOCOCCUS AUREUS   Report Status 04/30/2014 FINAL  Final      Susceptibility   Methicillin resistant staphylococcus aureus - MIC*    CLINDAMYCIN <=0.25 SENSITIVE Sensitive     ERYTHROMYCIN >=8 RESISTANT Resistant     GENTAMICIN <=0.5 SENSITIVE Sensitive     LEVOFLOXACIN 4 INTERMEDIATE Intermediate     OXACILLIN >=4 RESISTANT Resistant     PENICILLIN >=0.5 RESISTANT Resistant     RIFAMPIN <=0.5 SENSITIVE Sensitive     TRIMETH/SULFA <=10 SENSITIVE Sensitive     VANCOMYCIN <=0.5 SENSITIVE Sensitive     TETRACYCLINE <=1 SENSITIVE Sensitive     * METHICILLIN RESISTANT STAPHYLOCOCCUS AUREUS  Fungus Culture with Smear     Status: None (Preliminary result)   Collection Time: 04/27/14  9:10 AM  Result Value Ref Range Status   Specimen Description BRONCHIAL ALVEOLAR LAVAGE  Final   Special Requests NONE  Final   Fungal Smear   Final    NO YEAST OR FUNGAL ELEMENTS SEEN    Culture   Final    CULTURE IN PROGRESS FOR FOUR WEEKS   Report Status PENDING  Incomplete  Pneumocystis smear by DFA     Status: None   Collection Time: 04/27/14  9:10 AM  Result Value Ref Range Status   Specimen Source-[facility] BRONCHIAL ALVEOLAR LAVAGE  Final   Pneumocystis jiroveci Ag NEGATIVE  Final    Comment: Performed at [facility]  AFB culture with smear     Status: None (Preliminary result)   Collection Time: 04/27/14  9:10 AM  Result Value  Ref Range Status   Specimen Description BIOPSY LUNG  Final   Special Requests NONE  Final   Acid Fast Smear   Final    NO ACID FAST BACILLI SEEN   Culture   Final    CULTURE WILL BE EXAMINED FOR 6 WEEKS BEFORE ISSUING A FINAL REPORT   Report Status PENDING  Incomplete     Scheduled Meds: . butamben-tetracaine-benzocaine  1 spray Topical Once  . Chlorhexidine Gluconate Cloth  6 each Topical Q0600  . feeding supplement (ENSURE COMPLETE)  237 mL Oral TID BM  . feeding supplement (RESOURCE BREEZE)  1 Container Oral TID BM  . heparin  5,000 Units Subcutaneous 3 times per day  . lidocaine  1 application Topical Once  . linezolid  600 mg Oral Q12H  . mupirocin ointment  1 application Nasal BID  . pantoprazole  40 mg Oral Daily  . predniSONE  20 mg Oral BID WC  . sulfamethoxazole-trimethoprim  1 tablet Oral Daily  . sulfaSALAzine  1,000 mg Oral BID  . zolpidem  5 mg Oral QHS   Continuous Infusions: . sodium chloride 75 mL/hr at 05/01/14 [phone]

## 2014-05-01 NOTE — Progress Notes (Signed)
TEE scheduled for 05/02/2014 at 2 pm. Confirmed with cardio 05/01/2014. Leisa Lenz

## 2014-05-02 ENCOUNTER — Encounter (HOSPITAL_COMMUNITY)
Admission: EM | Disposition: A | Discharge status: Home / Self Care | Payer: Self-pay | Source: Home / Self Care | Attending: Internal Medicine

## 2014-05-02 ENCOUNTER — Encounter (HOSPITAL_COMMUNITY): Payer: Self-pay

## 2014-05-02 DIAGNOSIS — I38 Endocarditis, valve unspecified: Secondary | ICD-10-CM

## 2014-05-02 DIAGNOSIS — R1030 Lower abdominal pain, unspecified: Secondary | ICD-10-CM | POA: Insufficient documentation

## 2014-05-02 HISTORY — PX: TEE WITHOUT CARDIOVERSION: SHX5443

## 2014-05-02 LAB — CULTURE, BLOOD (ROUTINE X 2)
CULTURE: NO GROWTH
Culture: NO GROWTH

## 2014-05-02 SURGERY — ECHOCARDIOGRAM, TRANSESOPHAGEAL
Anesthesia: Moderate Sedation

## 2014-05-02 MED ORDER — FENTANYL CITRATE 0.05 MG/ML IJ SOLN
INTRAMUSCULAR | Status: DC | PRN
  Administered 2014-05-02 (×2): 25 ug via INTRAVENOUS

## 2014-05-02 MED ORDER — MIDAZOLAM HCL 10 MG/2ML IJ SOLN
INTRAMUSCULAR | Status: DC | PRN
  Administered 2014-05-02 (×2): 2 mg via INTRAVENOUS

## 2014-05-02 MED ORDER — BUTAMBEN-TETRACAINE-BENZOCAINE 2-2-14 % EX AERO
INHALATION_SPRAY | CUTANEOUS | Status: DC | PRN
  Administered 2014-05-02: 2 via TOPICAL

## 2014-05-02 NOTE — Interval H&P Note (Signed)
History and Physical Interval Note:  05/02/2014 2:28 PM  [name] [name]  has presented today for surgery, with the diagnosis of RULE OUT ENDOCARDITIS  The various methods of treatment have been discussed with the patient and family. After consideration of risks, benefits and other options for treatment, the patient has consented to  Procedure(s): TRANSESOPHAGEAL ECHOCARDIOGRAM (TEE) (N/A) as a surgical intervention .  The patient's history has been reviewed, patient examined, no change in status, stable for surgery.  I have reviewed the patient's chart and labs.  Questions were answered to the patient's satisfaction.     [name]

## 2014-05-02 NOTE — CV Procedure (Signed)
Procedure: TEE  Indication: Endocarditis assessment  Sedation: Versed 4 mg IV, Fentanyl 50 mcg IV  Findings: See echo section for full report.  Normal LV size with mild LV hypertrophy.  EF 60-65%.  Normal RV size and systolic function.  Mildly calcified aortic valve with trivial regurgitation.  Trivial MR.  Normal atrial sizes.  No LAA thrombus.  Mild plaque in the aortic arch.  Negative bubble study so no evidence for PFO or ASD.  No valvular vegetation seen.   No evidence for endocarditis.   [name] [name] 05/02/2014 2:50 PM

## 2014-05-02 NOTE — OR Nursing (Signed)
[facility] here to transport back to [place]

## 2014-05-02 NOTE — Progress Notes (Signed)
Patient ID: [name] [name], female   DOB: 28-May-1961, 53 y.o.   MRN: [id number] [facility] PROGRESS NOTE  [name] [name] EXM:[id number] DOB: 16-Aug-1960 DOA: 04/25/2014 PCP: No primary care provider on file.  Brief narrative: 53 y.o. female with known history of IVDU, reently diagnosed with HIV (not seen by ID specialist and not started on HAART), who presented with 2 week history of lower abdominal pain, vomiting, weight loss, and diminished appetite, BRBPR. Recently hospitalized at [facility] for UC flare where she was treated with solumedrol and was discharged with script for Prednisone and Sulfasalazine but pt explains she was unable to get it.  Work up in the ED revealed cavitary lesions in lower lobes on CT abd/pelvis, a CXR also notable for cavitary lesion in the RUL, as well as findings of PNA in left lower lobe. There is no mention of cavitary lesions on her [place] H&P nor discharge summary and patient reports that she did not have a CXR during that admit. Patient underwent bronchoscopy 04/27/2014 with brushings & transbronchial biopsies x 3 obtained from RUL cavitary lesion. Cavitary lesions growing MRSA. TEE negative for vegetations.  Assessment/Plan:     Principal Problem:  Acute hypoxic respiratory failure / HCAP / cavitary lesions growing staph aureus from bronchoscopy - likely multifactorial secondary to bilateral cavitary lesions which are growing MRSA from bronchoscopy.  - patient underwent bronchoscopy and over 04/27/2014 with brushings and transbronchial biopsies obtained from the right upper lung cavitary lesion which as mentioned are growing MRSA. Fungal culture from bronchoscopy shows no yeast or fungal elements. AFB 3 smears are negative.  - Per infectious disease, linezolid was started. TEE doen 05/02/2014 and negative for vegetations. Per ID, patient needs 1 month of linezolid provided she can get access to that medication.  She needs to be monitored in ID  clinic for leukopenia/thrombocytopenia or other side effects.  - repeat CXR in about 2 weeks to monitor for resolution - Blood cultures so far negative but there is still high suspicious for possible endocarditis considering patient's history of IV drug use. -Pneumocystis negative  Active Problems:  Hypotension - BP at goal  Anemia of chronic disease, likely HIV - drop in hg since admission possibly dilutional from IVF pt has been receiving since admission - no signs of active bleeding, transfused one unit of blood (11/10) - posttransfusion Hgb stable, 8.1 --> 8.9  Thrombocytosis - secondary to an inflammatory process  Hyponatremia - likely pre renal from poor oral intake - sodium improved with IV fluids, WNL.  Hypokalemia  - likely from lasix  - supplemented  Abdominal pain / diarrhea  - from underlying UC imposed on new infection as noted above - currently on prednisone and sulfasalazine  - added Levsin PRN for abdominal cramps   HIV positive - ID consulted, appreciate assistance - CD4 140 - Continue Bactrim for PCP for prophylaxis - ART likely on outpatient basis   Severe PCM - likely from underlying infection - diet as tolerated   DVT Prophylaxis:   Heparin SQ ordered  Code Status: Full.  Family Communication: plan of care discussed with the patient Disposition Plan: Home when stable. Will need f/u appt with ID clinic, needs [facility] appt    IV access:   Peripheral IV  Procedures and diagnostic studies:    Dg Chest 2 View 04/25/2014 Prominent cavitary infiltrate right upper lobe. These findings are most likely infectious. Active granulomatous disease cannot be excluded. Pulmonary abscess cannot be excluded. Patchy infiltrates left mid  lung field a left lower lobe consistent with pneumonia. Close follow-up chest x-rays are suggested to demonstrate clearing of these findings. Underlying tumor particularly in the right upper lobe  cannot be excluded.   Ct Chest Wo Contrast 04/26/2014 Examination is positive for multi focal, bilateral cavitary lesions with surrounding allowing glass attenuation. In a patient who is immunocompromised findings are concerning for infection including pulmonary tuberculosis, septic emboli and atypical infections. Certain malignancy such as squamous cell carcinoma may also cavitate. No significant mediastinal or hilar adenopathy. Atherosclerotic disease including multi vessel coronary artery calcification.   Ct Abdomen Pelvis W Contrast 04/25/2014 Numerous cavitary lesions at the lung bases with considerations including inflammatory and infectious processes. Vasculitis can have a similar appearance but is felt to be less likely. Significant wall thickening of the entire colon, most notable in the distal aspect, favored to be infectious. Inflammatory causes could have a similar appearance. Periportal and retroperitoneal adenopathy. No abscess or bowel obstruction.  Bronchoscopy 04/27/2014 Brushings & transbronchial biopsies x 3 were obtained from RUL cavitary lesion  Dg Chest [facility] 04/27/2014 No pneumothorax post bronchoscopy. Persisting cavitary lesion in the RIGHT upper lobe and mild scattered infiltrates, favor infectious.  TEE 05/02/2014 - no vegetations   Medical Consultants:   Infectious disease (Dr. Dominga Ferry COmer)  Pulmonary  Other Consultants:   Nutrition  IAnti-Infectives:    Vancomycin 11/09 --> 04/26/2014   Maxipime 11/09 --> 04/26/2014  Bactrim 04/26/2014 -->  Linezolid 04/29/2014 -->   Leisa Lenz, MD  [facility] Pager [phone]  If 7PM-7AM, please contact night-coverage [email or web address] Password [email or web address] 05/02/2014, 3:20 PM   LOS: 7 days    HPI/Subjective: No acute overnight events.  Objective: Filed Vitals:   05/02/14 1440 05/02/14 1445 05/02/14 1450 05/02/14 1500  BP: 136/74 160/82 143/85 134/79  Pulse: 87  96 104   Temp:    98.4 F (36.9 C)  TempSrc:    Oral  Resp: 22 26 21 25   Height:      Weight:      SpO2: 100% 100% 100% 97%    Intake/Output Summary (Last 24 hours) at 05/02/14 1520 Last data filed at 05/02/14 1039  Gross per 24 hour  Intake    600 ml  Output   1801 ml  Net  -1201 ml    Exam:   General:   not in acute distress  Cardiovascular: Regular rate and rhythm, S1/S2, no murmurs  Respiratory: Clear to auscultation bilaterally, no wheezing, no crackles, no rhonchi  Abdomen: non tender, non distended, bowel sounds present  Extremities: No edema, pulses DP and PT palpable bilaterally    Data Reviewed: Basic Metabolic Panel:  Recent Labs Lab 04/26/14 0347 04/27/14 0408 05/01/14 0444  NA 135* 136* 137  K 3.7 3.2* 4.4  CL 103 106 104  CO2 24 22 23   GLUCOSE 157* 174* 117*  BUN 12 11 11   CREATININE 0.55 0.49* 0.57  CALCIUM 8.2* 8.4 8.4   Liver Function Tests: No results for input(s): AST, ALT, ALKPHOS, BILITOT, PROT, ALBUMIN in the last 168 hours. No results for input(s): LIPASE, AMYLASE in the last 168 hours. No results for input(s): AMMONIA in the last 168 hours. CBC:  Recent Labs Lab 04/26/14 0347 04/27/14 0408 04/28/14 0510 05/01/14 0444  WBC 5.2 4.4 6.6 6.6  HGB 7.5* 8.1* 8.8* 8.9*  HCT 21.5* 23.4* 25.0* 26.1*  MCV 79.3 81.0 82.5 81.8  PLT 595* 590* 600* 564*   Cardiac Enzymes: No results for input(s): CKTOTAL, CKMB, CKMBINDEX,  TROPONINI in the last 168 hours. BNP: Invalid input(s): POCBNP CBG: No results for input(s): GLUCAP in the last 168 hours.  Recent Results (from the past 240 hour(s))  Culture, blood (routine x 2)     Status: None   Collection Time: 04/25/14  7:38 PM  Result Value Ref Range Status   Specimen Description BLOOD RIGHT ARM  Final   Special Requests BOTTLES DRAWN AEROBIC AND ANAEROBIC 10CC  Final   Culture  Setup Time   Final    04/26/2014 01:22 Performed at [facility]    Culture   Final    NO  GROWTH 5 DAYS Performed at [facility]    Report Status 05/02/2014 FINAL  Final  Culture, blood (routine x 2)     Status: None   Collection Time: 04/25/14  7:48 PM  Result Value Ref Range Status   Specimen Description BLOOD RIGHT HAND  Final   Special Requests BOTTLES DRAWN AEROBIC AND ANAEROBIC 10CC  Final   Culture  Setup Time   Final    04/26/2014 01:22 Performed at [facility]    Culture   Final    NO GROWTH 5 DAYS Performed at [facility]    Report Status 05/02/2014 FINAL  Final  AFB culture with smear     Status: None (Preliminary result)   Collection Time: 04/26/14  5:10 PM  Result Value Ref Range Status   Specimen Description SPUTUM  Final   Special Requests Immunocompromised  Final   Acid Fast Smear   Final    NO ACID FAST BACILLI SEEN Performed at [facility]    Culture   Final    CULTURE WILL BE EXAMINED FOR 6 WEEKS BEFORE ISSUING A FINAL REPORT Performed at [facility]    Report Status PENDING  Incomplete  Culture, expectorated sputum-assessment     Status: None   Collection Time: 04/26/14  5:11 PM  Result Value Ref Range Status   Specimen Description SPUTUM  Final   Special Requests Immunocompromised  Final   Sputum evaluation   Final    THIS SPECIMEN IS ACCEPTABLE. RESPIRATORY CULTURE REPORT TO FOLLOW.   Report Status 04/26/2014 FINAL  Final  Culture, respiratory (NON-Expectorated)     Status: None   Collection Time: 04/26/14  5:11 PM  Result Value Ref Range Status   Specimen Description SPUTUM  Final   Special Requests NONE  Final   Gram Stain   Final    NO WBC SEEN RARE SQUAMOUS EPITHELIAL CELLS PRESENT RARE GRAM POSITIVE COCCI IN PAIRS RARE GRAM NEGATIVE RODS RARE GRAM NEGATIVE COCCI    Culture   Final    NORMAL OROPHARYNGEAL FLORA Performed at [facility]    Report Status 04/28/2014 FINAL  Final  AFB culture with smear     Status: None (Preliminary result)   Collection Time: 04/27/14   9:10 AM  Result Value Ref Range Status   Specimen Description BRONCHIAL ALVEOLAR LAVAGE  Final   Special Requests Immunocompromised  Final   Acid Fast Smear   Final    NO ACID FAST BACILLI SEEN Performed at [facility]    Culture   Final    CULTURE WILL BE EXAMINED FOR 6 WEEKS BEFORE ISSUING A FINAL REPORT Performed at [facility]    Report Status PENDING  Incomplete  Culture, bal-quantitative     Status: None   Collection Time: 04/27/14  9:10 AM  Result Value Ref Range Status  Specimen Description BRONCHIAL ALVEOLAR LAVAGE  Final   Special Requests NONE  Final   Gram Stain   Final    ABUNDANT WBC PRESENT, PREDOMINANTLY PMN RARE SQUAMOUS EPITHELIAL CELLS PRESENT FEW GRAM POSITIVE COCCI IN PAIRS IN CHAINS IN CLUSTERS Performed at [place]   Final    >=100,000 COLONIES/ML Performed at [facility]    Culture   Final    METHICILLIN RESISTANT STAPHYLOCOCCUS AUREUS Note: RIFAMPIN AND GENTAMICIN SHOULD NOT BE USED AS SINGLE DRUGS FOR TREATMENT OF STAPH INFECTIONS. This organism DOES NOT demonstrate inducible Clindamycin resistance in vitro. CRITICAL RESULT CALLED TO, READ BACK BY AND VERIFIED WITH: C. HUGHEY RN 1PM  04/30/14 GUSTK Performed at [facility]    Report Status 04/30/2014 FINAL  Final   Organism ID, Bacteria METHICILLIN RESISTANT STAPHYLOCOCCUS AUREUS  Final      Susceptibility   Methicillin resistant staphylococcus aureus - MIC*    CLINDAMYCIN <=0.25 SENSITIVE Sensitive     ERYTHROMYCIN >=8 RESISTANT Resistant     GENTAMICIN <=0.5 SENSITIVE Sensitive     LEVOFLOXACIN 4 INTERMEDIATE Intermediate     OXACILLIN >=4 RESISTANT Resistant     PENICILLIN >=0.5 RESISTANT Resistant     RIFAMPIN <=0.5 SENSITIVE Sensitive     TRIMETH/SULFA <=10 SENSITIVE Sensitive     VANCOMYCIN <=0.5 SENSITIVE Sensitive     TETRACYCLINE <=1 SENSITIVE Sensitive     * METHICILLIN RESISTANT STAPHYLOCOCCUS AUREUS  Fungus Culture  with Smear     Status: None (Preliminary result)   Collection Time: 04/27/14  9:10 AM  Result Value Ref Range Status   Specimen Description BRONCHIAL ALVEOLAR LAVAGE  Final   Special Requests NONE  Final   Fungal Smear   Final    NO YEAST OR FUNGAL ELEMENTS SEEN Performed at [facility]    Culture   Final    CANDIDA ALBICANS Performed at [facility]    Report Status PENDING  Incomplete  Pneumocystis smear by DFA     Status: None   Collection Time: 04/27/14  9:10 AM  Result Value Ref Range Status   Specimen Source-PJSRC BRONCHIAL ALVEOLAR LAVAGE  Final   Pneumocystis jiroveci Ag NEGATIVE  Final    Comment: Performed at [place] [facility]  AFB culture with smear     Status: None (Preliminary result)   Collection Time: 04/27/14  9:10 AM  Result Value Ref Range Status   Specimen Description BIOPSY LUNG  Final   Special Requests NONE  Final   Acid Fast Smear   Final    NO ACID FAST BACILLI SEEN Performed at [facility]    Culture   Final    CULTURE WILL BE EXAMINED FOR 6 WEEKS BEFORE ISSUING A FINAL REPORT Performed at [facility]    Report Status PENDING  Incomplete     Scheduled Meds: . butamben-tetracaine-benzocaine  1 spray Topical Once  . Chlorhexidine Gluconate Cloth  6 each Topical Q0600  . feeding supplement (ENSURE COMPLETE)  237 mL Oral TID BM  . feeding supplement (RESOURCE BREEZE)  1 Container Oral TID BM  . heparin  5,000 Units Subcutaneous 3 times per day  . lidocaine  1 application Topical Once  . linezolid  600 mg Oral Q12H  . mupirocin ointment  1 application Nasal BID  . pantoprazole  40 mg Oral Daily  . predniSONE  20 mg Oral BID WC  . sulfamethoxazole-trimethoprim  1 tablet Oral  Daily  . sulfaSALAzine  1,000 mg Oral BID  . zolpidem  5 mg Oral QHS   Continuous Infusions: . sodium chloride 500 mL (05/02/14 1416)

## 2014-05-02 NOTE — H&P (View-Only) (Signed)
TEE scheduled for 05/02/2014 at 2 pm. Confirmed with cardio 05/01/2014. [name] [name]

## 2014-05-02 NOTE — Progress Notes (Signed)
[place] for Infectious Disease  Date of Admission:  04/25/2014  Antibiotics: Bactrim prophylaxis linezolid  Subjective: No acute events  Objective: Temp:  [97.9 F (36.6 C)-98.7 F (37.1 C)] 98.4 F (36.9 C) (11/16 1500) Pulse Rate:  [69-104] 104 (11/16 1450) Resp:  [15-26] 25 (11/16 1500) BP: (134-178)/(74-99) 134/79 mmHg (11/16 1500) SpO2:  [97 %-100 %] 97 % (11/16 1500)  General: awake, in bed Skin: no rashes Lungs: CTA B Cor: RRR Abdomen: soft, nt, nd Ext: no edema  Lab Results Lab Results  Component Value Date   WBC 6.6 05/01/2014   HGB 8.9* 05/01/2014   HCT 26.1* 05/01/2014   MCV 81.8 05/01/2014   PLT 564* 05/01/2014    Lab Results  Component Value Date   CREATININE 0.57 05/01/2014   BUN 11 05/01/2014   NA 137 05/01/2014   K 4.4 05/01/2014   CL 104 05/01/2014   CO2 23 05/01/2014    Lab Results  Component Value Date   ALT 8 04/25/2014   AST 19 04/25/2014   ALKPHOS 377* 04/25/2014   BILITOT 0.4 04/25/2014      Microbiology: Recent Results (from the past 240 hour(s))  Culture, blood (routine x 2)     Status: None   Collection Time: 04/25/14  7:38 PM  Result Value Ref Range Status   Specimen Description BLOOD RIGHT ARM  Final   Special Requests BOTTLES DRAWN AEROBIC AND ANAEROBIC 10CC  Final   Culture  Setup Time   Final    04/26/2014 01:22 Performed at [facility]    Culture   Final    NO GROWTH 5 DAYS Performed at [facility]    Report Status 05/02/2014 FINAL  Final  Culture, blood (routine x 2)     Status: None   Collection Time: 04/25/14  7:48 PM  Result Value Ref Range Status   Specimen Description BLOOD RIGHT HAND  Final   Special Requests BOTTLES DRAWN AEROBIC AND ANAEROBIC 10CC  Final   Culture  Setup Time   Final    04/26/2014 01:22 Performed at [facility]    Culture   Final    NO GROWTH 5 DAYS Performed at [facility]    Report Status 05/02/2014 FINAL  Final  AFB culture  with smear     Status: None (Preliminary result)   Collection Time: 04/26/14  5:10 PM  Result Value Ref Range Status   Specimen Description SPUTUM  Final   Special Requests Immunocompromised  Final   Acid Fast Smear   Final    NO ACID FAST BACILLI SEEN Performed at [facility]    Culture   Final    CULTURE WILL BE EXAMINED FOR 6 WEEKS BEFORE ISSUING A FINAL REPORT Performed at [facility]    Report Status PENDING  Incomplete  Culture, expectorated sputum-assessment     Status: None   Collection Time: 04/26/14  5:11 PM  Result Value Ref Range Status   Specimen Description SPUTUM  Final   Special Requests Immunocompromised  Final   Sputum evaluation   Final    THIS SPECIMEN IS ACCEPTABLE. RESPIRATORY CULTURE REPORT TO FOLLOW.   Report Status 04/26/2014 FINAL  Final  Culture, respiratory (NON-Expectorated)     Status: None   Collection Time: 04/26/14  5:11 PM  Result Value Ref Range Status   Specimen Description SPUTUM  Final   Special Requests NONE  Final   Gram Stain   Final  NO WBC SEEN RARE SQUAMOUS EPITHELIAL CELLS PRESENT RARE GRAM POSITIVE COCCI IN PAIRS RARE GRAM NEGATIVE RODS RARE GRAM NEGATIVE COCCI    Culture   Final    NORMAL OROPHARYNGEAL FLORA Performed at [facility]    Report Status 04/28/2014 FINAL  Final  AFB culture with smear     Status: None (Preliminary result)   Collection Time: 04/27/14  9:10 AM  Result Value Ref Range Status   Specimen Description BRONCHIAL ALVEOLAR LAVAGE  Final   Special Requests Immunocompromised  Final   Acid Fast Smear   Final    NO ACID FAST BACILLI SEEN Performed at [facility]    Culture   Final    CULTURE WILL BE EXAMINED FOR 6 WEEKS BEFORE ISSUING A FINAL REPORT Performed at [facility]    Report Status PENDING  Incomplete  Culture, bal-quantitative     Status: None   Collection Time: 04/27/14  9:10 AM  Result Value Ref Range Status   Specimen Description BRONCHIAL  ALVEOLAR LAVAGE  Final   Special Requests NONE  Final   Gram Stain   Final    ABUNDANT WBC PRESENT, PREDOMINANTLY PMN RARE SQUAMOUS EPITHELIAL CELLS PRESENT FEW GRAM POSITIVE COCCI IN PAIRS IN CHAINS IN CLUSTERS Performed at [place]   Final    >=100,000 COLONIES/ML Performed at [facility]    Culture   Final    METHICILLIN RESISTANT STAPHYLOCOCCUS AUREUS Note: RIFAMPIN AND GENTAMICIN SHOULD NOT BE USED AS SINGLE DRUGS FOR TREATMENT OF STAPH INFECTIONS. This organism DOES NOT demonstrate inducible Clindamycin resistance in vitro. CRITICAL RESULT CALLED TO, READ BACK BY AND VERIFIED WITH: C. HUGHEY RN 1PM  04/30/14 GUSTK Performed at [facility]    Report Status 04/30/2014 FINAL  Final   Organism ID, Bacteria METHICILLIN RESISTANT STAPHYLOCOCCUS AUREUS  Final      Susceptibility   Methicillin resistant staphylococcus aureus - MIC*    CLINDAMYCIN <=0.25 SENSITIVE Sensitive     ERYTHROMYCIN >=8 RESISTANT Resistant     GENTAMICIN <=0.5 SENSITIVE Sensitive     LEVOFLOXACIN 4 INTERMEDIATE Intermediate     OXACILLIN >=4 RESISTANT Resistant     PENICILLIN >=0.5 RESISTANT Resistant     RIFAMPIN <=0.5 SENSITIVE Sensitive     TRIMETH/SULFA <=10 SENSITIVE Sensitive     VANCOMYCIN <=0.5 SENSITIVE Sensitive     TETRACYCLINE <=1 SENSITIVE Sensitive     * METHICILLIN RESISTANT STAPHYLOCOCCUS AUREUS  Fungus Culture with Smear     Status: None (Preliminary result)   Collection Time: 04/27/14  9:10 AM  Result Value Ref Range Status   Specimen Description BRONCHIAL ALVEOLAR LAVAGE  Final   Special Requests NONE  Final   Fungal Smear   Final    NO YEAST OR FUNGAL ELEMENTS SEEN Performed at [facility]    Culture   Final    CANDIDA ALBICANS Performed at [facility]    Report Status PENDING  Incomplete  Pneumocystis smear by DFA     Status: None   Collection Time: 04/27/14  9:10 AM  Result Value Ref Range Status   Specimen  Source-PJSRC BRONCHIAL ALVEOLAR LAVAGE  Final   Pneumocystis jiroveci Ag NEGATIVE  Final    Comment: Performed at [place] [facility]  AFB culture with smear     Status: None (Preliminary result)   Collection Time: 04/27/14  9:10 AM  Result Value Ref Range Status   Specimen Description  BIOPSY LUNG  Final   Special Requests NONE  Final   Acid Fast Smear   Final    NO ACID FAST BACILLI SEEN Performed at [facility]    Culture   Final    CULTURE WILL BE EXAMINED FOR 6 WEEKS BEFORE ISSUING A FINAL REPORT Performed at [facility]    Report Status PENDING  Incomplete    Studies/Results: No results found.  Assessment/Plan: 1) cavitary lesions - now growing MRSA from bronchoscopy.  Blood cultures remain negative.  TEE negative for vegetation 11/16.  -she should get 1 month of linezolid (can she get access to it?) and we will monitor her closely in our clinic for leukopenia/thrombocytopenia or other side effects.    -will change her to Bactrim high dose after one month or if any side effects -will repeat CXR in about 2 weeks to monitor for improvement Will cotinue antibiotics until resolution on CXR -if unimproved at follow up, will refer back to pulmonary outpatient  2) HIV/AIDS - On Bactrim prophylaxis, holding on ARVs pending access.  We will get her into our clinic to get drug assistance.  CD4 of 140.  Genotype pending.  She confirms it is ok to call her phone and ok to leave a message.  Scharlene Gloss, [place] [facility] [place] [email or web address] [phone] pager   [phone] cell 05/02/2014, 3:02 PM

## 2014-05-02 NOTE — Progress Notes (Signed)
Patient returned  from [facility] department via carelink.  No changes in patient condition noted.  Will continue to monitor.

## 2014-05-03 ENCOUNTER — Encounter (HOSPITAL_COMMUNITY): Payer: Self-pay | Admitting: Cardiology

## 2014-05-03 MED ORDER — PREDNISONE 5 MG PO TABS
50.0000 mg | ORAL_TABLET | Freq: Every day | ORAL | Status: DC
Start: 2014-05-03 — End: 2014-05-16

## 2014-05-03 MED ORDER — SULFAMETHOXAZOLE-TRIMETHOPRIM 800-160 MG PO TABS: 2.0000 | ORAL_TABLET | Freq: Two times a day (BID) | ORAL | Status: DC

## 2014-05-03 MED ORDER — HYOSCYAMINE SULFATE 0.125 MG PO TABS: 0.1250 mg | ORAL_TABLET | Freq: Two times a day (BID) | ORAL | Status: DC | PRN

## 2014-05-03 MED ORDER — SULFASALAZINE 500 MG PO TABS: 1000.0000 mg | ORAL_TABLET | Freq: Two times a day (BID) | ORAL | Status: DC

## 2014-05-03 MED ORDER — ENSURE COMPLETE PO LIQD: 237.0000 mL | Freq: Three times a day (TID) | ORAL | Status: DC

## 2014-05-03 MED ORDER — HYDROCHLOROTHIAZIDE 25 MG PO TABS: 25.0000 mg | ORAL_TABLET | Freq: Every day | ORAL | Status: DC

## 2014-05-03 MED ORDER — MIRTAZAPINE 15 MG PO TBDP: 15.0000 mg | ORAL_TABLET | Freq: Every day | ORAL | Status: DC

## 2014-05-03 MED ORDER — SULFAMETHOXAZOLE-TRIMETHOPRIM 800-160 MG PO TABS
2.0000 | ORAL_TABLET | Freq: Two times a day (BID) | ORAL | Status: DC
  Filled 2014-05-03 (×2): qty 2

## 2014-05-03 MED ORDER — OXYCODONE-ACETAMINOPHEN 5-325 MG PO TABS: 1.0000 | ORAL_TABLET | ORAL | Status: DC | PRN

## 2014-05-03 MED ORDER — MUPIROCIN 2 % EX OINT: 1.0000 "application " | TOPICAL_OINTMENT | Freq: Two times a day (BID) | CUTANEOUS | Status: DC

## 2014-05-03 MED ORDER — AMLODIPINE BESYLATE 10 MG PO TABS: 10.0000 mg | ORAL_TABLET | Freq: Every day | ORAL | Status: DC

## 2014-05-03 MED ORDER — HYDROCORTISONE 2.5 % RE CREA: TOPICAL_CREAM | Freq: Three times a day (TID) | RECTAL | Status: DC | PRN

## 2014-05-03 MED ORDER — BOOST / RESOURCE BREEZE PO LIQD: 1.0000 | Freq: Three times a day (TID) | ORAL | Status: DC

## 2014-05-03 MED ORDER — PANTOPRAZOLE SODIUM 40 MG PO TBEC: 40.0000 mg | DELAYED_RELEASE_TABLET | Freq: Two times a day (BID) | ORAL | Status: DC

## 2014-05-03 MED ORDER — ZOLPIDEM TARTRATE 5 MG PO TABS: 5.0000 mg | ORAL_TABLET | Freq: Every day | ORAL | Status: DC

## 2014-05-03 NOTE — Discharge Instructions (Signed)
MRSA Infection MRSA stands for methicillin-resistant Staphylococcus aureus. This type of infection is caused by Staphylococcus aureus bacteria that are no longer affected by the medicines used to kill them (drug resistant). Staphylococcus (staph) bacteria are normally found on the skin or in the nose of healthy people. In most cases, these bacteria do not cause infection. But if these resistant bacteria enter your body through a cut or sore, they can cause a serious infection on your skin or in other parts of your body. There is a slight chance that the staph on your skin or in your nose is MRSA. There are two types of MRSA infections:  Hospital-acquired MRSA is bacteria that you get in the hospital.  Community-acquired MRSA is bacteria that you get somewhere other than in a hospital. RISK FACTORS Hospital-acquired MRSA is more common. You could be at risk for this infection if you are in the hospital and you:  Have surgery or a procedure.  Have an IV access or a catheter tube placed in your body.  Have weak resistance to germs (weakened immune system).  Are elderly.  Are on kidney dialysis. You could be at risk for community-acquired MRSA if you have a break in your skin and come into contact with MRSA. This may happen if you:  Play sports where there is skin-to-skin contact.  Live in a crowded setting, like a dormitory or a [employer]  Share towels, razors, or sports equipment with other people. SYMPTOMS  Symptoms of hospital-acquired MRSA depend on where MRSA has spread. Symptoms may include:  Wound infection.  Skin infection.  Rash.  Pneumonia.  Fever and chills.  Difficulty breathing.  Chest pain. Community-acquired MRSA is most likely to start as a scratch or cut that becomes infected. Symptoms may include:  A pus-filled pimple.  A boil on your skin.  Pus draining from your skin.  A sore (abscess) under your skin or somewhere in your body.  Fever  with or without chills. DIAGNOSIS  The diagnosis of MRSA is made by taking a sample from an infected area and sending it to a lab for testing. A lab technician can grow (culture) MRSA and check it under a microscope. The cultured MRSA can be tested to see which type of antibiotic medicine will work to treat it. Newer tests can identify MRSA more quickly by testing bacteria samples for MRSA genes. Your health care provider can diagnose MRSA using samples from:   Cuts or wounds in infected areas.  Nasal swabs.  Saliva or cough specimens from deep in the lungs (sputum).  Urine.  Blood. You may also have:  Imaging studies (such as X-Krull or MRI) to check if the infection has spread to the lungs, bones, or joints.  A culture and sensitivity test of blood or fluids from inside the joints. TREATMENT  Treatment depends on how severe, deep, or extensive the infection is. Very bad infections may require a hospital stay.  Some skin infections, such as a small boil or sore (abscess), may be treated by draining pus from the site of the infection.  More extensive surgery to drain pus may be necessary for deeper or more widespread soft tissue infections.  You may then have to take antibiotic medicine given by mouth or through a vein. You may start antibiotic treatment right away or after testing can be done to see what antibiotic medicine should be used. HOME CARE INSTRUCTIONS   Take your antibiotics as directed by your health care provider. Take  the medicine as prescribed until it is finished.  Avoid close contact with those around you as much as possible. Do not use towels, razors, toothbrushes, bedding, or other items that will be used by others.  Wash your hands frequently for 15 seconds with soap and water. Dry your hands with a clean or disposable towel.  When you are not able to wash your hands, use hand sanitizer that is more than 60 percent alcohol.  Wash towels, sheets, or clothes in  the washing machine with detergent and hot water. Dry them in a hot dryer.  Follow your health care provider's instructions for wound care. Wash your hands before and after changing your bandages.  Always shower after exercising.  Keep all cuts and scrapes clean and covered with a bandage.  Be sure to tell all your health care providers that you have MRSA so they are aware of your infection. SEEK MEDICAL CARE IF:  You have a cut, scrape, pimple, or boil that becomes red, swollen, or painful or has pus in it.  You have pus draining from your skin.  You have an abscess under your skin or somewhere in your body. SEEK IMMEDIATE MEDICAL CARE IF:   You have symptoms of a skin infection with a fever or chills.  You have trouble breathing.  You have chest pain.  You have a skin wound and you become nauseous or start vomiting. MAKE SURE YOU:  Understand these instructions.  Will watch your condition.  Will get help right away if you are not doing well or get worse. Document Released: 06/03/2005 Document Revised: 06/08/2013 Document Reviewed: 03/26/2013 [facility] Patient Information 2015 [place], [place]. This information is not intended to replace advice given to you by your health care provider. Make sure you discuss any questions you have with your health care provider.

## 2014-05-03 NOTE — Care Management Note (Signed)
CARE MANAGEMENT NOTE 05/03/2014  Patient:  [name] [name], [name] [name]   Account Number:  [id number]  Date Initiated:  04/26/2014  Documentation initiated by:  Marney Doctor  Subjective/Objective Assessment:   53 yo admitted with Ulcerative Colitis. Hx of GERD (gastroesophageal reflux disease) Hypertension     . HIV (human immunodeficiency virus infection)     . Hepatitis C     . Substance abuse     . GI bleed       Action/Plan:   From home with daughter   Anticipated DC Date:  05/03/2014   Anticipated DC Plan:  [place]  CM consult  [facility]  Follow-up appt scheduled      Choice offered to / List presented to:             Status of service:  In process, will continue to follow Medicare Important Message given?   (If response is "NO", the following Medicare IM given date fields will be blank) Date Medicare IM given:   Medicare IM given by:   Date Additional Medicare IM given:   Additional Medicare IM given by:    Discharge Disposition:    Per UR Regulation:  Reviewed for med. necessity/level of care/duration of stay  If discussed at [place] of Stay Meetings, dates discussed:    Comments:  05/03/14 Marney Doctor RN,BSN,NCM Pt qualified for the [facility] program.  Letter given to pt and explanation given where to get medications.  Pt appreciative of CM assistance.  05/03/14 Marney Doctor RN,BSN,NCM Made appointment at [facility] Infectious Disease office for pt to follow up and placed on DC instructions.  Attempted to make appointment at [facility] and they are booked and asked that pt call back tomorrow am for appointment. This placed on DC instructions as well.  Per Dr Comer's note he wanted pt to go home on Zyvox.  I called [facility] and asked about assistance program.  Was given coupon instructions to print for pt.  With coupon, Zyvox would still cost about $4000.  Dr. Linus Salmons called to inform of this and he states that he will just send pt  home on  Bactrim.  I called [facility] outpt pharmacy and they informed me that 30 day supply would be about $10 dollars.  Pt states her daughter can help her pay for this.  No other DC needs noted at this time.  Pt will DC home with daughter.  04/26/14 Marney Doctor RN,BSN,NCM [phone] CM consult for medication needs.  Pt states she does have a PCP but would prefer someone else.  Pt given [place] information and informed that if she makes a F/U appointment at the [facility] that she could take her DC prescriptions from the hospital straight to [facility] and get help with her meds.  She states she will do this.  She states she has signed up for Medicaid.  Not other CM needs at this time.  CM will continue to follow.

## 2014-05-03 NOTE — Care Management Note (Signed)
CARE MANAGEMENT NOTE 05/03/2014  Patient:  [name] [name], [name] [name]   Account Number:  [id number]  Date Initiated:  04/26/2014  Documentation initiated by:  Marney Doctor  Subjective/Objective Assessment:   53 yo admitted with Ulcerative Colitis. Hx of GERD (gastroesophageal reflux disease) Hypertension     . HIV (human immunodeficiency virus infection)     . Hepatitis C     . Substance abuse     . GI bleed       Action/Plan:   From home with daughter   Anticipated DC Date:  05/03/2014   Anticipated DC Plan:  [place]  CM consult  [facility]  Follow-up appt scheduled      Choice offered to / List presented to:             Status of service:  In process, will continue to follow Medicare Important Message given?   (If response is "NO", the following Medicare IM given date fields will be blank) Date Medicare IM given:   Medicare IM given by:   Date Additional Medicare IM given:   Additional Medicare IM given by:    Discharge Disposition:    Per UR Regulation:  Reviewed for med. necessity/level of care/duration of stay  If discussed at [place] of Stay Meetings, dates discussed:    Comments:  05/03/14 Marney Doctor RN,BSN,NCM Made appointment at [facility] Infectious Disease office for pt to follow up and placed on DC instructions.  Attempted to make appointment at [facility] and they are booked and asked that pt call back tomorrow am for appointment. This placed on DC instructions as well.  Per Dr Comer's note he wanted pt to go home on Zyvox.  I called [facility] and asked about assistance program.  Was given coupon instructions to print for pt.  With coupon, Zyvox would still cost about $4000.  Dr. Linus Salmons called to inform of this and he states that he will just send pt home on  Bactrim.  I called [facility] out[facility] pharmacy and they informed me that 30 day supply would be about $10 dollars.  Pt states her daughter can help her pay for this.  No other DC  needs noted at this time.  Pt will DC home with daughter.  04/26/14 Marney Doctor RN,BSN,NCM [phone] CM consult for medication needs.  Pt states she does have a PCP but would prefer someone else.  Pt given [place] information and informed that if she makes a F/U appointment at the [facility] that she could take her DC prescriptions from the hospital straight to [facility] and get help with her meds.  She states she will do this.  She states she has signed up for Medicaid.  Not other CM needs at this time.  CM will continue to follow.

## 2014-05-03 NOTE — Progress Notes (Signed)
[name] [name] for Infectious Disease  Date of Admission:  04/25/2014  Antibiotics: Bactrim prophylaxis linezolid  Subjective: No acute events  Objective: Temp:  [97.9 F (36.6 C)-98.7 F (37.1 C)] 98.5 F (36.9 C) (11/17 0550) Pulse Rate:  [74-104] 75 (11/17 0550) Resp:  [15-26] 16 (11/17 0550) BP: (124-178)/(72-99) 164/85 mmHg (11/17 0550) SpO2:  [97 %-100 %] 100 % (11/17 0550)  General: awake, in bed Skin: no rashes Lungs: CTA B Cor: RRR Abdomen: soft, nt, nd Ext: no edema  Lab Results Lab Results  Component Value Date   WBC 6.6 05/01/2014   HGB 8.9* 05/01/2014   HCT 26.1* 05/01/2014   MCV 81.8 05/01/2014   PLT 564* 05/01/2014    Lab Results  Component Value Date   CREATININE 0.57 05/01/2014   BUN 11 05/01/2014   NA 137 05/01/2014   K 4.4 05/01/2014   CL 104 05/01/2014   CO2 23 05/01/2014    Lab Results  Component Value Date   ALT 8 04/25/2014   AST 19 04/25/2014   ALKPHOS 377* 04/25/2014   BILITOT 0.4 04/25/2014      Microbiology: Recent Results (from the past 240 hour(s))  Culture, blood (routine x 2)     Status: None   Collection Time: 04/25/14  7:38 PM  Result Value Ref Range Status   Specimen Description BLOOD RIGHT ARM  Final   Special Requests BOTTLES DRAWN AEROBIC AND ANAEROBIC 10CC  Final   Culture  Setup Time   Final    04/26/2014 01:22 Performed at [facility]    Culture   Final    NO GROWTH 5 DAYS Performed at [facility]    Report Status 05/02/2014 FINAL  Final  Culture, blood (routine x 2)     Status: None   Collection Time: 04/25/14  7:48 PM  Result Value Ref Range Status   Specimen Description BLOOD RIGHT HAND  Final   Special Requests BOTTLES DRAWN AEROBIC AND ANAEROBIC 10CC  Final   Culture  Setup Time   Final    04/26/2014 01:22 Performed at [facility]    Culture   Final    NO GROWTH 5 DAYS Performed at [facility]    Report Status 05/02/2014 FINAL  Final  AFB culture  with smear     Status: None (Preliminary result)   Collection Time: 04/26/14  5:10 PM  Result Value Ref Range Status   Specimen Description SPUTUM  Final   Special Requests Immunocompromised  Final   Acid Fast Smear   Final    NO ACID FAST BACILLI SEEN Performed at [facility]    Culture   Final    CULTURE WILL BE EXAMINED FOR 6 WEEKS BEFORE ISSUING A FINAL REPORT Performed at [facility]    Report Status PENDING  Incomplete  Culture, expectorated sputum-assessment     Status: None   Collection Time: 04/26/14  5:11 PM  Result Value Ref Range Status   Specimen Description SPUTUM  Final   Special Requests Immunocompromised  Final   Sputum evaluation   Final    THIS SPECIMEN IS ACCEPTABLE. RESPIRATORY CULTURE REPORT TO FOLLOW.   Report Status 04/26/2014 FINAL  Final  Culture, respiratory (NON-Expectorated)     Status: None   Collection Time: 04/26/14  5:11 PM  Result Value Ref Range Status   Specimen Description SPUTUM  Final   Special Requests NONE  Final   Gram Stain   Final  NO WBC SEEN RARE SQUAMOUS EPITHELIAL CELLS PRESENT RARE GRAM POSITIVE COCCI IN PAIRS RARE GRAM NEGATIVE RODS RARE GRAM NEGATIVE COCCI    Culture   Final    NORMAL OROPHARYNGEAL FLORA Performed at [facility]    Report Status 04/28/2014 FINAL  Final  AFB culture with smear     Status: None (Preliminary result)   Collection Time: 04/27/14  9:10 AM  Result Value Ref Range Status   Specimen Description BRONCHIAL ALVEOLAR LAVAGE  Final   Special Requests Immunocompromised  Final   Acid Fast Smear   Final    NO ACID FAST BACILLI SEEN Performed at [facility]    Culture   Final    CULTURE WILL BE EXAMINED FOR 6 WEEKS BEFORE ISSUING A FINAL REPORT Performed at [facility]    Report Status PENDING  Incomplete  Culture, bal-quantitative     Status: None   Collection Time: 04/27/14  9:10 AM  Result Value Ref Range Status   Specimen Description BRONCHIAL  ALVEOLAR LAVAGE  Final   Special Requests NONE  Final   Gram Stain   Final    ABUNDANT WBC PRESENT, PREDOMINANTLY PMN RARE SQUAMOUS EPITHELIAL CELLS PRESENT FEW GRAM POSITIVE COCCI IN PAIRS IN CHAINS IN CLUSTERS Performed at [place]   Final    >=100,000 COLONIES/ML Performed at [facility]    Culture   Final    METHICILLIN RESISTANT STAPHYLOCOCCUS AUREUS Note: RIFAMPIN AND GENTAMICIN SHOULD NOT BE USED AS SINGLE DRUGS FOR TREATMENT OF STAPH INFECTIONS. This organism DOES NOT demonstrate inducible Clindamycin resistance in vitro. CRITICAL RESULT CALLED TO, READ BACK BY AND VERIFIED WITH: C. HUGHEY RN 1PM  04/30/14 GUSTK Performed at [facility]    Report Status 04/30/2014 FINAL  Final   Organism ID, Bacteria METHICILLIN RESISTANT STAPHYLOCOCCUS AUREUS  Final      Susceptibility   Methicillin resistant staphylococcus aureus - MIC*    CLINDAMYCIN <=0.25 SENSITIVE Sensitive     ERYTHROMYCIN >=8 RESISTANT Resistant     GENTAMICIN <=0.5 SENSITIVE Sensitive     LEVOFLOXACIN 4 INTERMEDIATE Intermediate     OXACILLIN >=4 RESISTANT Resistant     PENICILLIN >=0.5 RESISTANT Resistant     RIFAMPIN <=0.5 SENSITIVE Sensitive     TRIMETH/SULFA <=10 SENSITIVE Sensitive     VANCOMYCIN <=0.5 SENSITIVE Sensitive     TETRACYCLINE <=1 SENSITIVE Sensitive     * METHICILLIN RESISTANT STAPHYLOCOCCUS AUREUS  Fungus Culture with Smear     Status: None (Preliminary result)   Collection Time: 04/27/14  9:10 AM  Result Value Ref Range Status   Specimen Description BRONCHIAL ALVEOLAR LAVAGE  Final   Special Requests NONE  Final   Fungal Smear   Final    NO YEAST OR FUNGAL ELEMENTS SEEN Performed at [facility]    Culture   Final    CANDIDA ALBICANS Performed at [facility]    Report Status PENDING  Incomplete  Pneumocystis smear by DFA     Status: None   Collection Time: 04/27/14  9:10 AM  Result Value Ref Range Status   Specimen  Source-[facility] BRONCHIAL ALVEOLAR LAVAGE  Final   Pneumocystis jiroveci Ag NEGATIVE  Final    Comment: Performed at [place] [facility]  AFB culture with smear     Status: None (Preliminary result)   Collection Time: 04/27/14  9:10 AM  Result Value Ref Range Status   Specimen Description  BIOPSY LUNG  Final   Special Requests NONE  Final   Acid Fast Smear   Final    NO ACID FAST BACILLI SEEN Performed at [facility]    Culture   Final    CULTURE WILL BE EXAMINED FOR 6 WEEKS BEFORE ISSUING A FINAL REPORT Performed at [facility]    Report Status PENDING  Incomplete    Studies/Results: No results found.  Assessment/Plan: 1) cavitary lesions - now growing MRSA from bronchoscopy.  Blood cultures remain negative.  TEE negative for vegetation 11/16.  -can't get linezolid due to cost so will use Bactrim 2 DS tabs twice a day for one month.  -will repeat CXR in about 2 weeks to monitor for improvement Will cotinue antibiotics until resolution on CXR -if unimproved at follow up, will refer back to pulmonary outpatient  2) HIV/AIDS - On Bactrim prophylaxis/treatment for #1, holding on ARVs pending access.  We will get her into our clinic to get drug assistance.  CD4 of 140.  Genotype pending.  She confirms it is ok to call her phone and ok to leave a message.  [name] [name], [place] for Infectious Disease [place] [email or web address] [phone] pager   [phone] cell 05/03/2014, 11:55 AM

## 2014-05-03 NOTE — Progress Notes (Addendum)
33mg fentanyl wasted on 04/25/14. Witnessed by [employer] unable to waste in pyxis.

## 2014-05-03 NOTE — Discharge Summary (Addendum)
Physician Discharge Summary  [name] [name] VHQ:[id number] DOB: 15-Aug-1960 DOA: 04/25/2014  PCP: No primary care provider on file.  Admit date: 04/25/2014 Discharge date: 05/03/2014  Recommendations for Outpatient Follow-up:  1. Continue Bactrim double-strand 2 tablets twice daily for 1 month. Follow-up in infectious disease clinic per scheduled appointment. 2. Please call community health wellness clinic and schedule an appointment as soon as possible after the discharge. 3. You may continue prednisone but prescription provided for prednisone taper. Taper down prednisone starting from 50 mg a day, taper down by 5 mg a day down to 0 mg. for ex, today 50 mg, tomorrow 45 mg, then 40 mg the following day and etc...  Discharge Diagnoses:  Principal Problem:   Ulcerative colitis, acute Active Problems:   Abdominal pain   HIV positive   Cavitary lesion of lung   HCAP (healthcare-associated pneumonia)   Protein-calorie malnutrition, severe   Lower abdominal pain    Discharge Condition: stable   Diet recommendation: as tolerated   History of present illness:  53 y.o. female with known history of IVDU, reently diagnosed with HIV (not seen by ID specialist and not started on HAART), who presented with 2 week history of lower abdominal pain, vomiting, weight loss, and diminished appetite, BRBPR. Recently hospitalized at [facility] for UC flare where she was treated with solumedrol and was discharged with script for Prednisone and Sulfasalazine but pt explains she was unable to get it.  Work up in the ED revealed cavitary lesions in lower lobes on CT abd/pelvis, a CXR also notable for cavitary lesion in the RUL, as well as findings of PNA in left lower lobe. There is no mention of cavitary lesions on her [place] H&P nor discharge summary and patient reports that she did not have a CXR during that admit. Patient underwent bronchoscopy 04/27/2014 with brushings & transbronchial biopsies x  3 obtained from RUL cavitary lesion. Cavitary lesions growing MRSA. TEE negative for vegetations.  Assessment/Plan:     Principal Problem:  Acute hypoxic respiratory failure / HCAP / cavitary lesions growing staph aureus from bronchoscopy - likely multifactorial secondary to bilateral cavitary lesions which are growing MRSA from bronchoscopy.  - patient underwent bronchoscopy and over 04/27/2014 with brushings and transbronchial biopsies obtained from the right upper lung cavitary lesion which as mentioned are growing MRSA. Fungal culture from bronchoscopy shows no yeast or fungal elements. AFB 3 smears are negative.  - Per infectious disease, linezolid was started. TEE done 05/02/2014 and negative for vegetations. Pt will be discharged on bactrim DS 2 tabs twice a day for 1 month and needs follow up in ID clinic per sch appt.  - Blood cultures so far negative but there is still high suspicious for possible endocarditis considering patient's history of IV drug use. -Pneumocystis negative  Active Problems:  Hypotension - BP at goal - will resume Norvasc and hctz on discharge   Anemia of chronic disease, likely HIV - drop in hg since admission possibly dilutional from IVF pt has been receiving since admission - no signs of active bleeding, transfused one unit of blood (11/10) - posttransfusion Hgb stable, 8.1 --> 8.9  Thrombocytosis - secondary to an inflammatory process  Hyponatremia - likely pre renal from poor oral intake - sodium improved with IV fluids, WNL.  Hypokalemia  - likely from lasix  - supplemented  Abdominal pain / diarrhea  - from underlying UC imposed on new infection as noted above - currently on prednisone taper and sulfasalazine  -  added Levsin PRN for abdominal cramps   HIV positive / AIDS, HAART not yet initiated  - CD4 140; considering MRSA infection would consider this as AIDS defining illness  - Continue Bactrim for PCP for prophylaxis -  ART likely on outpatient basis   Severe PCM - likely from underlying infection - diet as tolerated   DVT Prophylaxis:   Heparin SQ ordered  Code Status: Full.  Family Communication: plan of care discussed with the patient Disposition Plan: Home when stable. Will need f/u appt with ID clinic, needs [facility] appt    IV access:   Peripheral IV  Procedures and diagnostic studies:    Dg Chest 2 View 04/25/2014 Prominent cavitary infiltrate right upper lobe. These findings are most likely infectious. Active granulomatous disease cannot be excluded. Pulmonary abscess cannot be excluded. Patchy infiltrates left mid lung field a left lower lobe consistent with pneumonia. Close follow-up chest x-rays are suggested to demonstrate clearing of these findings. Underlying tumor particularly in the right upper lobe cannot be excluded.   Ct Chest Wo Contrast 04/26/2014 Examination is positive for multi focal, bilateral cavitary lesions with surrounding allowing glass attenuation. In a patient who is immunocompromised findings are concerning for infection including pulmonary tuberculosis, septic emboli and atypical infections. Certain malignancy such as squamous cell carcinoma may also cavitate. No significant mediastinal or hilar adenopathy. Atherosclerotic disease including multi vessel coronary artery calcification.   Ct Abdomen Pelvis W Contrast 04/25/2014 Numerous cavitary lesions at the lung bases with considerations including inflammatory and infectious processes. Vasculitis can have a similar appearance but is felt to be less likely. Significant wall thickening of the entire colon, most notable in the distal aspect, favored to be infectious. Inflammatory causes could have a similar appearance. Periportal and retroperitoneal adenopathy. No abscess or bowel obstruction.  Bronchoscopy 04/27/2014 Brushings & transbronchial biopsies x 3 were obtained from RUL cavitary  lesion  Dg Chest [facility] 04/27/2014 No pneumothorax post bronchoscopy. Persisting cavitary lesion in the RIGHT upper lobe and mild scattered infiltrates, favor infectious.  TEE 05/02/2014 - no vegetations  Medical Consultants:   Infectious disease (Dr. Dominga Ferry COmer)  Pulmonary  Other Consultants:   Nutrition  IAnti-Infectives:    Vancomycin 11/09 --> 04/26/2014   Maxipime 11/09 --> 04/26/2014  Bactrim 04/26/2014 --> as prescirebed for MRSA lung cavitary lesion and PCP prophylaxis   Linezolid 04/29/2014 --> 05/03/2014    Signed:  Leisa Lenz, MD  [facility] 05/03/2014, 11:25 AM  Pager #: [phone]   Discharge Exam: Filed Vitals:   05/03/14 0550  BP: 164/85  Pulse: 75  Temp: 98.5 F (36.9 C)  Resp: 16   Filed Vitals:   05/02/14 1520 05/02/14 1637 05/02/14 2055 05/03/14 0550  BP: 138/91 138/81 124/72 164/85  Pulse: 83 74 87 75  Temp:  98.1 F (36.7 C) 97.9 F (36.6 C) 98.5 F (36.9 C)  TempSrc:  Oral Oral Oral  Resp: 16 16 18 16   Height:      Weight:      SpO2: 99% 100% 100% 100%    General: Pt is alert, follows commands appropriately, not in acute distress Cardiovascular: Regular rate and rhythm, S1/S2 +, no murmurs Respiratory: Clear to auscultation bilaterally, no wheezing, no crackles, no rhonchi Abdominal: Soft, non tender, non distended, bowel sounds +, no guarding Extremities: no edema, no cyanosis, pulses palpable bilaterally DP and PT Neuro: Grossly nonfocal  Discharge Instructions  Discharge Instructions    Call MD for:  difficulty breathing, headache or visual  disturbances    Complete by:  As directed      Call MD for:  hives    Complete by:  As directed      Call MD for:  persistant dizziness or light-headedness    Complete by:  As directed      Call MD for:  persistant nausea and vomiting    Complete by:  As directed      Call MD for:  redness, tenderness, or signs of infection  (pain, swelling, redness, odor or green/yellow discharge around incision site)    Complete by:  As directed      Call MD for:  severe uncontrolled pain    Complete by:  As directed      Diet - low sodium heart healthy    Complete by:  As directed      Discharge instructions    Complete by:  As directed   1. Continue Bactrim double-strand 2 tablets twice daily for 1 month. Follow-up in infectious disease clinic per scheduled appointment. 2. Please call community health wellness clinic and schedule an appointment as soon as possible after the discharge. 3. You may continue prednisone but prescription provided for prednisone taper. Taper down prednisone starting from 50 mg a day, taper down by 5 mg a day down to 0 mg. for ex, today 50 mg, tomorrow 45 mg, then 40 mg the following day and etc...     Increase activity slowly    Complete by:  As directed             Medication List    STOP taking these medications        hydrALAZINE 50 MG tablet  Commonly known as:  APRESOLINE      TAKE these medications        amLODipine 10 MG tablet  Commonly known as:  NORVASC  Take 1 tablet (10 mg total) by mouth daily.     feeding supplement (ENSURE COMPLETE) Liqd  Take 237 mLs by mouth 3 (three) times daily between meals.     feeding supplement (RESOURCE BREEZE) Liqd  Take 1 Container by mouth 3 (three) times daily between meals.     hydrochlorothiazide 25 MG tablet  Commonly known as:  HYDRODIURIL  Take 1 tablet (25 mg total) by mouth daily.     hydrocortisone 2.5 % rectal cream  Commonly known as:  ANUSOL-HC  Place rectally 3 (three) times daily as needed for hemorrhoids or itching.     hyoscyamine 0.125 MG tablet  Commonly known as:  LEVSIN, ANASPAZ  Take 1 tablet (0.125 mg total) by mouth 2 (two) times daily as needed for cramping (abd cramping).     mirtazapine 15 MG disintegrating tablet  Commonly known as:  REMERON SOL-TAB  Take 1 tablet (15 mg total) by mouth at bedtime.      mupirocin ointment 2 %  Commonly known as:  BACTROBAN  Place 1 application into the nose 2 (two) times daily.     oxyCODONE-acetaminophen 5-325 MG per tablet  Commonly known as:  PERCOCET  Take 1-2 tablets by mouth every 4 (four) hours as needed for severe pain.     pantoprazole 40 MG tablet  Commonly known as:  PROTONIX  Take 1 tablet (40 mg total) by mouth 2 (two) times daily.     predniSONE 5 MG tablet  Commonly known as:  DELTASONE  Take 10 tablets (50 mg total) by mouth daily with breakfast.     sulfamethoxazole-trimethoprim  800-160 MG per tablet  Commonly known as:  BACTRIM DS,SEPTRA DS  Take 2 tablets by mouth 2 (two) times daily.     sulfaSALAzine 500 MG tablet  Commonly known as:  AZULFIDINE  Take 2 tablets (1,000 mg total) by mouth 2 (two) times daily.     zolpidem 5 MG tablet  Commonly known as:  AMBIEN  Take 1 tablet (5 mg total) by mouth at bedtime.           Follow-up Information    Follow up with Dorian Heckle, MD.   Specialty:  Internal Medicine   Contact information:   [address] [place] [address] [phone]       Follow up with [facility]              On 05/10/2014.   Why:  at 3:45pm   Contact information:   [address] [place] [address]       Follow up with [facility]    .   Why:  Call on Wednesday 11/18am to make appointment   Contact information:   [address] [place] [address] [phone]       The results of significant diagnostics from this hospitalization (including imaging, microbiology, ancillary and laboratory) are listed below for reference.    Significant Diagnostic Studies: Dg Chest 2 View  04/25/2014   CLINICAL DATA:  Weakness for 3 weeks.  EXAM: CHEST  2 VIEW  COMPARISON:  None.  FINDINGS: Ill-defined right upper lobe infiltrate with cavitation is noted. Subtle patchy nodular  infiltrates in the left mid lung and left lower lobe noted. These findings are consistent with active infectious of infiltrates including granulomas infiltrates. Cavitation in the right upper lobe infiltrate may be related to developing abscess. Cavitary tumor cannot be excluded. Close follow-up chest x-rays to demonstrate clearing suggested. Prominent nipple shadows noted. Cardiomegaly with normal pulmonary vascularity. No pleural effusion or pneumothorax. No acute or focal bony abnormality.  IMPRESSION: 1. Prominent cavitary infiltrate right upper lobe. These findings are most likely infectious. Active granulomatous disease cannot be excluded. Pulmonary abscess cannot be excluded. 2. Patchy infiltrates left mid lung field a left lower lobe consistent with pneumonia. Close follow-up chest x-rays are suggested to demonstrate clearing of these findings. Underlying tumor particularly in the right upper lobe cannot be excluded.   Electronically Signed   By: Marcello Moores  Register   On: 04/25/2014 17:11   Ct Chest Wo Contrast  04/26/2014   CLINICAL DATA:  Short of breath and 35 lb weight loss.  EXAM: CT CHEST WITHOUT CONTRAST  TECHNIQUE: Multidetector CT imaging of the chest was performed following the standard protocol without IV contrast.  COMPARISON:  12/21/2013  FINDINGS: Mediastinum: The heart size is normal. There is calcified atherosclerotic disease involving the thoracic aorta as well as the LAD, left circumflex and RCA coronary arteries. There is no mediastinal or hilar adenopathy.  Lungs/Pleura: No pleural effusion identified. Multiple bilateral cavitary lesions are identified with surrounding ground-glass attenuation. The largest is in the right upper lobe an measures 4.4 x 4.2 cm, image 20/ series 5. Smaller non cavitary nodules are scattered throughout both lungs.  Upper Abdomen: No acute findings identified within the upper abdomen.  Musculoskeletal: Review of the visualized bony structures is unremarkable.  No aggressive lytic or sclerotic bone lesions.  IMPRESSION: 1. Examination is positive for multi focal, bilateral cavitary lesions with surrounding  allowing glass attenuation. In in a patient who is immunocompromised findings are concerning for infection including pulmonary tuberculosis, septic emboli and atypical infections. Certain malignancy such as squamous cell carcinoma may also cavitate. 2. No significant mediastinal or hilar adenopathy. 3. Atherosclerotic disease including multi vessel coronary artery calcification.   Electronically Signed   By: Kerby Moors M.D.   On: 04/26/2014 09:04   Ct Abdomen Pelvis W Contrast  04/25/2014   CLINICAL DATA:  Lower abdominal pain. Cough, weakness. Rectal bleeding. History of ulcerative colitis, hysterectomy, hepatitis-C. History of hypertension and HIV.  EXAM: CT ABDOMEN AND PELVIS WITH CONTRAST  TECHNIQUE: Multidetector CT imaging of the abdomen and pelvis was performed using the standard protocol following bolus administration of intravenous contrast.  CONTRAST:  183m OMNIPAQUE IOHEXOL 300 MG/ML  SOLN  COMPARISON:  None.  FINDINGS: Lower chest: Patchy densities are identified at the lung bases, associated a small cavitary lesions. The heart size is normal.  Upper abdomen: Nonspecific low-attenuation lesion is identified within the posterior right hepatic lobe measuring 7 mm in diameter. A similar nodule is identified in the left hepatic lobe on image 18, also measuring 7 mm in diameter. There is mild periportal edema. No focal abnormality identified within the spleen or pancreas. The adrenal glands have a normal appearance. Kidneys show symmetric bilateral excretion. Small left renal cyst is 8 mm in diameter. Gallbladder is present.  Gastrointestinal tract: The stomach and small bowel loops are normal in appearance. There is diffuse thickening of the ascending, transverse, descending, and sigmoid colonic wall consistent with colitis.  Pelvis: Urinary bladder has a  normal appearance. The uterus is absent. No adnexal mass identified.  Retroperitoneum: There are small retroperitoneal and mesenteric lymph nodes. Periportal lymph nodes appear fluid and somewhat indistinct. The largest left periaortic lymph node measures 1.6 cm in diameter. There is atherosclerotic calcification of the abdominal aorta. No aneurysm.  Abdominal wall: Unremarkable.  Osseous structures: Unremarkable.  IMPRESSION: 1. Numerous cavitary lesions at the lung bases with considerations including inflammatory and infectious processes. Vasculitis can have a similar appearance but is felt to be less likely. Consider further evaluation with chest x-Fiorillo and/or chest CT. 2. Significant wall thickening of the entire colon, most notable in the distal aspect, favored to be infectious. Inflammatory causes could have a similar appearance. 3. Periportal and retroperitoneal adenopathy. 4. No abscess or bowel obstruction.   Electronically Signed   By: BShon HaleM.D.   On: 04/25/2014 16:19   Dg Chest Port 1 View  04/27/2014   CLINICAL DATA:  Post bronchoscopy and RIGHT upper lobe biopsy  EXAM: PORTABLE CHEST - 1 VIEW  COMPARISON:  Portable exam 0941 hr compared to CT chest 04/26/2014  FINDINGS: Borderline enlargement of cardiac silhouette.  Mediastinal contours and pulmonary vascularity normal.  Cavitary opacity in RIGHT upper lobe again identified corresponding to cavitary lesion on CT.  Scattered RIGHT upper lobe and LEFT perihilar infiltrates present.  No pneumothorax post bronchoscopy.  Remaining lungs clear.  No pleural effusion.  IMPRESSION: No pneumothorax post bronchoscopy.  Persisting cavitary lesion in the RIGHT upper lobe and mild scattered infiltrates, favor infectious.   Electronically Signed   By: MLavonia DanaM.D.   On: 04/27/2014 09:58   Dg C-arm Bronchoscopy  04/27/2014   CLINICAL DATA:    C-ARM BRONCHOSCOPY  Fluoroscopy was utilized by the requesting physician.  No radiographic  interpretation.      Microbiology: Recent Results (from the past 240 hour(s))  Culture, blood (routine x 2)  Status: None   Collection Time: 04/25/14  7:38 PM  Result Value Ref Range Status   Specimen Description BLOOD RIGHT ARM  Final   Special Requests BOTTLES DRAWN AEROBIC AND ANAEROBIC 10CC  Final   Culture  Setup Time   Final    04/26/2014 01:22 Performed at [facility]    Culture   Final    NO GROWTH 5 DAYS Performed at [facility]    Report Status 05/02/2014 FINAL  Final  Culture, blood (routine x 2)     Status: None   Collection Time: 04/25/14  7:48 PM  Result Value Ref Range Status   Specimen Description BLOOD RIGHT HAND  Final   Special Requests BOTTLES DRAWN AEROBIC AND ANAEROBIC 10CC  Final   Culture  Setup Time   Final    04/26/2014 01:22 Performed at [facility]    Culture   Final    NO GROWTH 5 DAYS Performed at [facility]    Report Status 05/02/2014 FINAL  Final  AFB culture with smear     Status: None (Preliminary result)   Collection Time: 04/26/14  5:10 PM  Result Value Ref Range Status   Specimen Description SPUTUM  Final   Special Requests Immunocompromised  Final   Acid Fast Smear   Final    NO ACID FAST BACILLI SEEN Performed at [facility]    Culture   Final    CULTURE WILL BE EXAMINED FOR 6 WEEKS BEFORE ISSUING A FINAL REPORT Performed at [facility]    Report Status PENDING  Incomplete  Culture, expectorated sputum-assessment     Status: None   Collection Time: 04/26/14  5:11 PM  Result Value Ref Range Status   Specimen Description SPUTUM  Final   Special Requests Immunocompromised  Final   Sputum evaluation   Final    THIS SPECIMEN IS ACCEPTABLE. RESPIRATORY CULTURE REPORT TO FOLLOW.   Report Status 04/26/2014 FINAL  Final  Culture, respiratory (NON-Expectorated)     Status: None   Collection Time: 04/26/14  5:11 PM  Result Value Ref Range Status   Specimen Description SPUTUM  Final   Special  Requests NONE  Final   Gram Stain   Final    NO WBC SEEN RARE SQUAMOUS EPITHELIAL CELLS PRESENT RARE GRAM POSITIVE COCCI IN PAIRS RARE GRAM NEGATIVE RODS RARE GRAM NEGATIVE COCCI    Culture   Final    NORMAL OROPHARYNGEAL FLORA Performed at [facility]    Report Status 04/28/2014 FINAL  Final  AFB culture with smear     Status: None (Preliminary result)   Collection Time: 04/27/14  9:10 AM  Result Value Ref Range Status   Specimen Description BRONCHIAL ALVEOLAR LAVAGE  Final   Special Requests Immunocompromised  Final   Acid Fast Smear   Final    NO ACID FAST BACILLI SEEN Performed at [facility]    Culture   Final    CULTURE WILL BE EXAMINED FOR 6 WEEKS BEFORE ISSUING A FINAL REPORT Performed at [facility]    Report Status PENDING  Incomplete  Culture, bal-quantitative     Status: None   Collection Time: 04/27/14  9:10 AM  Result Value Ref Range Status   Specimen Description BRONCHIAL ALVEOLAR LAVAGE  Final   Special Requests NONE  Final   Gram Stain   Final    ABUNDANT WBC PRESENT, PREDOMINANTLY PMN RARE SQUAMOUS EPITHELIAL CELLS PRESENT FEW GRAM POSITIVE COCCI  IN PAIRS IN CHAINS IN CLUSTERS Performed at [facility]   Final    >=100,000 COLONIES/ML Performed at [facility]    Culture   Final    METHICILLIN RESISTANT STAPHYLOCOCCUS AUREUS Note: RIFAMPIN AND GENTAMICIN SHOULD NOT BE USED AS SINGLE DRUGS FOR TREATMENT OF STAPH INFECTIONS. This organism DOES NOT demonstrate inducible Clindamycin resistance in vitro. CRITICAL RESULT CALLED TO, READ BACK BY AND VERIFIED WITH: C. HUGHEY RN 1PM  04/30/14 GUSTK Performed at [facility]    Report Status 04/30/2014 FINAL  Final   Organism ID, Bacteria METHICILLIN RESISTANT STAPHYLOCOCCUS AUREUS  Final      Susceptibility   Methicillin resistant staphylococcus aureus - MIC*    CLINDAMYCIN <=0.25 SENSITIVE Sensitive     ERYTHROMYCIN >=8 RESISTANT  Resistant     GENTAMICIN <=0.5 SENSITIVE Sensitive     LEVOFLOXACIN 4 INTERMEDIATE Intermediate     OXACILLIN >=4 RESISTANT Resistant     PENICILLIN >=0.5 RESISTANT Resistant     RIFAMPIN <=0.5 SENSITIVE Sensitive     TRIMETH/SULFA <=10 SENSITIVE Sensitive     VANCOMYCIN <=0.5 SENSITIVE Sensitive     TETRACYCLINE <=1 SENSITIVE Sensitive     * METHICILLIN RESISTANT STAPHYLOCOCCUS AUREUS  Fungus Culture with Smear     Status: None (Preliminary result)   Collection Time: 04/27/14  9:10 AM  Result Value Ref Range Status   Specimen Description BRONCHIAL ALVEOLAR LAVAGE  Final   Special Requests NONE  Final   Fungal Smear   Final    NO YEAST OR FUNGAL ELEMENTS SEEN Performed at [facility]    Culture   Final    CANDIDA ALBICANS Performed at [facility]    Report Status PENDING  Incomplete  Pneumocystis smear by DFA     Status: None   Collection Time: 04/27/14  9:10 AM  Result Value Ref Range Status   Specimen Source-PJSRC BRONCHIAL ALVEOLAR LAVAGE  Final   Pneumocystis jiroveci Ag NEGATIVE  Final    Comment: Performed at [facility]  AFB culture with smear     Status: None (Preliminary result)   Collection Time: 04/27/14  9:10 AM  Result Value Ref Range Status   Specimen Description BIOPSY LUNG  Final   Special Requests NONE  Final   Acid Fast Smear   Final    NO ACID FAST BACILLI SEEN Performed at [facility]    Culture   Final    CULTURE WILL BE EXAMINED FOR 6 WEEKS BEFORE ISSUING A FINAL REPORT Performed at [facility]    Report Status PENDING  Incomplete     Labs: Basic Metabolic Panel:  Recent Labs Lab 04/27/14 0408 05/01/14 0444  NA 136* 137  K 3.2* 4.4  CL 106 104  CO2 22 23  GLUCOSE 174* 117*  BUN 11 11  CREATININE 0.49* 0.57  CALCIUM 8.4 8.4   Liver Function Tests: No results for input(s): AST, ALT, ALKPHOS, BILITOT, PROT, ALBUMIN in the last 168 hours. No results for input(s): LIPASE, AMYLASE in  the last 168 hours. No results for input(s): AMMONIA in the last 168 hours. CBC:  Recent Labs Lab 04/27/14 0408 04/28/14 0510 05/01/14 0444  WBC 4.4 6.6 6.6  HGB 8.1* 8.8* 8.9*  HCT 23.4* 25.0* 26.1*  MCV 81.0 82.5 81.8  PLT 590* 600* 564*   Cardiac Enzymes: No results for input(s): CKTOTAL, CKMB, CKMBINDEX, TROPONINI in the last 168 hours. BNP: BNP (  last 3 results) No results for input(s): PROBNP in the last 8760 hours. CBG: No results for input(s): GLUCAP in the last 168 hours.  Time coordinating discharge: Over 30 minutes

## 2014-05-04 LAB — M. TUBERCULOSIS COMPLEX BY PCR: M. tuberculosis, Direct: NOT DETECTED

## 2014-05-04 LAB — HLA B*5701: HLA B 5701: POSITIVE

## 2014-05-04 NOTE — Progress Notes (Signed)
*  PRELIMINARY RESULTS* Echocardiogram Echocardiogram Transesophageal has been performed.  [name] [name] 05/04/2014, 12:05 PM

## 2014-05-10 ENCOUNTER — Encounter: Payer: Self-pay | Admitting: Infectious Diseases

## 2014-05-10 ENCOUNTER — Emergency Department (HOSPITAL_COMMUNITY): Payer: Medicaid Other

## 2014-05-10 ENCOUNTER — Encounter (HOSPITAL_COMMUNITY): Payer: Self-pay

## 2014-05-10 ENCOUNTER — Inpatient Hospital Stay (HOSPITAL_COMMUNITY)
Admission: EM | Admit: 2014-05-10 | Discharge: 2014-05-16 | DRG: 385 | Disposition: A | Discharge status: Home / Self Care | Payer: Medicaid Other | Attending: Internal Medicine | Admitting: Internal Medicine

## 2014-05-10 ENCOUNTER — Ambulatory Visit (INDEPENDENT_AMBULATORY_CARE_PROVIDER_SITE_OTHER): Payer: Medicaid Other | Admitting: Infectious Diseases

## 2014-05-10 VITALS — BP 133/81 | HR 134 | Temp 98.2°F | Wt 98.0 lb

## 2014-05-10 DIAGNOSIS — T380X5A Adverse effect of glucocorticoids and synthetic analogues, initial encounter: Secondary | ICD-10-CM | POA: Diagnosis present

## 2014-05-10 DIAGNOSIS — Z7952 Long term (current) use of systemic steroids: Secondary | ICD-10-CM

## 2014-05-10 DIAGNOSIS — K5190 Ulcerative colitis, unspecified, without complications: Secondary | ICD-10-CM | POA: Diagnosis present

## 2014-05-10 DIAGNOSIS — E871 Hypo-osmolality and hyponatremia: Secondary | ICD-10-CM | POA: Diagnosis present

## 2014-05-10 DIAGNOSIS — N179 Acute kidney failure, unspecified: Secondary | ICD-10-CM | POA: Diagnosis present

## 2014-05-10 DIAGNOSIS — K219 Gastro-esophageal reflux disease without esophagitis: Secondary | ICD-10-CM | POA: Diagnosis present

## 2014-05-10 DIAGNOSIS — R739 Hyperglycemia, unspecified: Secondary | ICD-10-CM | POA: Diagnosis present

## 2014-05-10 DIAGNOSIS — I10 Essential (primary) hypertension: Secondary | ICD-10-CM | POA: Diagnosis present

## 2014-05-10 DIAGNOSIS — K529 Noninfective gastroenteritis and colitis, unspecified: Secondary | ICD-10-CM

## 2014-05-10 DIAGNOSIS — K029 Dental caries, unspecified: Secondary | ICD-10-CM

## 2014-05-10 DIAGNOSIS — Z9119 Patient's noncompliance with other medical treatment and regimen: Secondary | ICD-10-CM | POA: Diagnosis present

## 2014-05-10 DIAGNOSIS — Z681 Body mass index (BMI) 19 or less, adult: Secondary | ICD-10-CM | POA: Diagnosis not present

## 2014-05-10 DIAGNOSIS — B20 Human immunodeficiency virus [HIV] disease: Secondary | ICD-10-CM | POA: Diagnosis present

## 2014-05-10 DIAGNOSIS — A4902 Methicillin resistant Staphylococcus aureus infection, unspecified site: Secondary | ICD-10-CM | POA: Diagnosis present

## 2014-05-10 DIAGNOSIS — R000 Tachycardia, unspecified: Secondary | ICD-10-CM

## 2014-05-10 DIAGNOSIS — Z91199 Patient's noncompliance with other medical treatment and regimen due to unspecified reason: Secondary | ICD-10-CM

## 2014-05-10 DIAGNOSIS — J984 Other disorders of lung: Secondary | ICD-10-CM

## 2014-05-10 DIAGNOSIS — E43 Unspecified severe protein-calorie malnutrition: Secondary | ICD-10-CM | POA: Diagnosis present

## 2014-05-10 DIAGNOSIS — E860 Dehydration: Secondary | ICD-10-CM

## 2014-05-10 DIAGNOSIS — Z79899 Other long term (current) drug therapy: Secondary | ICD-10-CM | POA: Diagnosis not present

## 2014-05-10 DIAGNOSIS — Z21 Asymptomatic human immunodeficiency virus [HIV] infection status: Secondary | ICD-10-CM | POA: Diagnosis present

## 2014-05-10 DIAGNOSIS — E861 Hypovolemia: Secondary | ICD-10-CM | POA: Diagnosis present

## 2014-05-10 DIAGNOSIS — D62 Acute posthemorrhagic anemia: Secondary | ICD-10-CM | POA: Diagnosis not present

## 2014-05-10 DIAGNOSIS — R109 Unspecified abdominal pain: Secondary | ICD-10-CM | POA: Diagnosis present

## 2014-05-10 DIAGNOSIS — R1084 Generalized abdominal pain: Secondary | ICD-10-CM

## 2014-05-10 LAB — CBC WITH DIFFERENTIAL/PLATELET
Basophils Absolute: 0 10*3/uL (ref 0.0–0.1)
Basophils Relative: 0 % (ref 0–1)
EOS ABS: 0 10*3/uL (ref 0.0–0.7)
EOS PCT: 0 % (ref 0–5)
HEMATOCRIT: 30.1 % — AB (ref 36.0–46.0)
HEMOGLOBIN: 10.2 g/dL — AB (ref 12.0–15.0)
LYMPHS ABS: 1.9 10*3/uL (ref 0.7–4.0)
Lymphocytes Relative: 27 % (ref 12–46)
MCH: 27.8 pg (ref 26.0–34.0)
MCHC: 33.9 g/dL (ref 30.0–36.0)
MCV: 82 fL (ref 78.0–100.0)
MONOS PCT: 4 % (ref 3–12)
Monocytes Absolute: 0.3 10*3/uL (ref 0.1–1.0)
NEUTROS PCT: 69 % (ref 43–77)
Neutro Abs: 4.8 10*3/uL (ref 1.7–7.7)
Platelets: 686 10*3/uL — ABNORMAL HIGH (ref 150–400)
RBC: 3.67 MIL/uL — AB (ref 3.87–5.11)
RDW: 18.6 % — ABNORMAL HIGH (ref 11.5–15.5)
WBC: 7 10*3/uL (ref 4.0–10.5)

## 2014-05-10 LAB — HIV-1 GENOTYPR PLUS

## 2014-05-10 LAB — URINALYSIS, ROUTINE W REFLEX MICROSCOPIC
Bilirubin Urine: NEGATIVE
Glucose, UA: NEGATIVE mg/dL
Hgb urine dipstick: NEGATIVE
Ketones, ur: NEGATIVE mg/dL
LEUKOCYTES UA: NEGATIVE
Nitrite: NEGATIVE
PROTEIN: NEGATIVE mg/dL
Specific Gravity, Urine: 1.019 (ref 1.005–1.030)
UROBILINOGEN UA: 0.2 mg/dL (ref 0.0–1.0)
pH: 6.5 (ref 5.0–8.0)

## 2014-05-10 LAB — COMPREHENSIVE METABOLIC PANEL
ALT: 21 U/L (ref 0–35)
AST: 25 U/L (ref 0–37)
Albumin: 2.6 g/dL — ABNORMAL LOW (ref 3.5–5.2)
Alkaline Phosphatase: 608 U/L — ABNORMAL HIGH (ref 39–117)
Anion gap: 19 — ABNORMAL HIGH (ref 5–15)
BILIRUBIN TOTAL: 0.2 mg/dL — AB (ref 0.3–1.2)
BUN: 33 mg/dL — ABNORMAL HIGH (ref 6–23)
CALCIUM: 9.3 mg/dL (ref 8.4–10.5)
CHLORIDE: 89 meq/L — AB (ref 96–112)
CO2: 19 mEq/L (ref 19–32)
CREATININE: 1.2 mg/dL — AB (ref 0.50–1.10)
GFR calc Af Amer: 58 mL/min — ABNORMAL LOW (ref >90)
GFR calc non Af Amer: 50 mL/min — ABNORMAL LOW (ref >90)
Glucose, Bld: 300 mg/dL — ABNORMAL HIGH (ref 70–99)
Potassium: 4.8 mEq/L (ref 3.7–5.3)
Sodium: 127 mEq/L — ABNORMAL LOW (ref 137–147)
Total Protein: 8.4 g/dL — ABNORMAL HIGH (ref 6.0–8.3)

## 2014-05-10 LAB — POC OCCULT BLOOD, ED: FECAL OCCULT BLD: POSITIVE — AB

## 2014-05-10 LAB — I-STAT CG4 LACTIC ACID, ED: Lactic Acid, Venous: 3.56 mmol/L — ABNORMAL HIGH (ref 0.5–2.2)

## 2014-05-10 LAB — LIPASE, BLOOD: LIPASE: 42 U/L (ref 11–59)

## 2014-05-10 MED ORDER — SODIUM CHLORIDE 0.9 % IV BOLUS (SEPSIS)
1000.0000 mL | Freq: Once | INTRAVENOUS | Status: AC
  Administered 2014-05-10: 1000 mL via INTRAVENOUS

## 2014-05-10 MED ORDER — IOHEXOL 300 MG/ML  SOLN
80.0000 mL | Freq: Once | INTRAMUSCULAR | Status: AC | PRN
  Administered 2014-05-10: 80 mL via INTRAVENOUS

## 2014-05-10 MED ORDER — HYDROMORPHONE HCL 1 MG/ML IJ SOLN
1.0000 mg | Freq: Once | INTRAMUSCULAR | Status: AC
  Administered 2014-05-10: 1 mg via INTRAVENOUS
  Filled 2014-05-10: qty 1

## 2014-05-10 MED ORDER — IOHEXOL 300 MG/ML  SOLN
25.0000 mL | INTRAMUSCULAR | Status: AC
  Administered 2014-05-10: 25 mL via ORAL

## 2014-05-10 NOTE — ED Notes (Signed)
Patient returned from CT

## 2014-05-10 NOTE — Assessment & Plan Note (Signed)
She has AIDS. She needs to meet with medicaiton assistance here in the clinic.  She has gotten flu and pnvx.  She needs dental.  Will f/u when she is d/c.

## 2014-05-10 NOTE — Assessment & Plan Note (Signed)
Repeat images in hospital.

## 2014-05-10 NOTE — ED Provider Notes (Signed)
CSN: [id number]     Arrival date & time 05/10/14  1644 History   First MD Initiated Contact with Patient 05/10/14 1722     Chief Complaint  Patient presents with  . Abdominal Pain     (Consider location/radiation/quality/duration/timing/severity/associated sxs/prior Treatment) HPI Patient is a 53 year old female with a history of HIV, recently diagnosed ulcerative colitis, as well as right upper lobe cavitary lesion of the lung, who presents with multiple complaints. She was discharged approximately week ago after being hospitalized for an acute onset of ulcerative colitis and a newly diagnosed right upper lobe cavitary lesion. Since her discharge, she has been feeling increasingly weak, has not been eating due to lack of appetite, feeling near syncopal with standing, and having worsening abdominal pain, and persistent bloody diarrhea.   Past Medical History  Diagnosis Date  . Ulcerative colitis   . GERD (gastroesophageal reflux disease)   . Hypertension   . HIV (human immunodeficiency virus infection)   . Substance abuse   . GI bleed    Past Surgical History  Procedure Laterality Date  . Abdominal hysterectomy    . Video bronchoscopy Bilateral 04/27/2014    Procedure: VIDEO BRONCHOSCOPY WITH FLUORO;  Surgeon: Rigoberto Noel, MD;  Location: [facility] ENDOSCOPY;  Service: Cardiopulmonary;  Laterality: Bilateral;  . Tee without cardioversion N/A 05/02/2014    Procedure: TRANSESOPHAGEAL ECHOCARDIOGRAM (TEE);  Surgeon: Larey Dresser, MD;  Location: [facility] ENDOSCOPY;  Service: Cardiovascular;  Laterality: N/A;   Family History  Problem Relation Age of Onset  . Cancer Father     ?  . Diabetes Mother   . Stroke Mother   . Alcoholism Father    History  Substance Use Topics  . Smoking status: Never Smoker   . Smokeless tobacco: Never Used  . Alcohol Use: No   OB History    No data available     Review of Systems    Allergies  Review of patient's allergies indicates no known  allergies.  Home Medications   Prior to Admission medications   Medication Sig Start Date End Date Taking? Authorizing Provider  amLODipine (NORVASC) 10 MG tablet Take 1 tablet (10 mg total) by mouth daily. 05/03/14  Yes Robbie Lis, MD  feeding supplement, ENSURE COMPLETE, (ENSURE COMPLETE) LIQD Take 237 mLs by mouth 3 (three) times daily between meals. 05/03/14  Yes Robbie Lis, MD  hydrALAZINE (APRESOLINE) 25 MG tablet Take 75 mg by mouth 3 (three) times daily.   Yes Historical Provider, MD  hydrochlorothiazide (HYDRODIURIL) 25 MG tablet Take 1 tablet (25 mg total) by mouth daily. 05/03/14  Yes Robbie Lis, MD  hyoscyamine (LEVSIN, ANASPAZ) 0.125 MG tablet Take 1 tablet (0.125 mg total) by mouth 2 (two) times daily as needed for cramping (abd cramping). 05/03/14  Yes Robbie Lis, MD  mirtazapine (REMERON SOL-TAB) 15 MG disintegrating tablet Take 1 tablet (15 mg total) by mouth at bedtime. 05/03/14  Yes Robbie Lis, MD  mupirocin ointment (BACTROBAN) 2 % Place 1 application into the nose 2 (two) times daily. 05/03/14  Yes Robbie Lis, MD  oxyCODONE-acetaminophen (PERCOCET) 5-325 MG per tablet Take 1-2 tablets by mouth every 4 (four) hours as needed for severe pain. 05/03/14  Yes Robbie Lis, MD  pantoprazole (PROTONIX) 40 MG tablet Take 1 tablet (40 mg total) by mouth 2 (two) times daily. 05/03/14  Yes Robbie Lis, MD  predniSONE (DELTASONE) 5 MG tablet Take 10 tablets (50 mg total) by mouth daily  with breakfast. 05/03/14  Yes Robbie Lis, MD  sulfamethoxazole-trimethoprim (BACTRIM DS,SEPTRA DS) 800-160 MG per tablet Take 2 tablets by mouth 2 (two) times daily. 05/03/14  Yes Robbie Lis, MD  sulfaSALAzine (AZULFIDINE) 500 MG tablet Take 2 tablets (1,000 mg total) by mouth 2 (two) times daily. 05/03/14  Yes Robbie Lis, MD  zolpidem (AMBIEN) 5 MG tablet Take 1 tablet (5 mg total) by mouth at bedtime. 05/03/14  Yes Robbie Lis, MD  feeding supplement, RESOURCE BREEZE,  (RESOURCE BREEZE) LIQD Take 1 Container by mouth 3 (three) times daily between meals. 05/03/14   Robbie Lis, MD  hydrocortisone (ANUSOL-HC) 2.5 % rectal cream Place rectally 3 (three) times daily as needed for hemorrhoids or itching. 05/03/14   Robbie Lis, MD   BP 116/80 mmHg  Pulse 109  Temp(Src) 97.9 F (36.6 C) (Oral)  Resp 18  Ht 5' 9"  (1.753 m)  Wt 98 lb (44.453 kg)  BMI 14.47 kg/m2  SpO2 100% Physical Exam  ED Course  Procedures (including critical care time) Labs Review Labs Reviewed  CBC WITH DIFFERENTIAL - Abnormal; Notable for the following:    RBC 3.67 (*)    Hemoglobin 10.2 (*)    HCT 30.1 (*)    RDW 18.6 (*)    Platelets 686 (*)    All other components within normal limits  COMPREHENSIVE METABOLIC PANEL - Abnormal; Notable for the following:    Sodium 127 (*)    Chloride 89 (*)    Glucose, Bld 300 (*)    BUN 33 (*)    Creatinine, Ser 1.20 (*)    Total Protein 8.4 (*)    Albumin 2.6 (*)    Alkaline Phosphatase 608 (*)    Total Bilirubin 0.2 (*)    GFR calc non Af Amer 50 (*)    GFR calc Af Amer 58 (*)    Anion gap 19 (*)    All other components within normal limits  URINALYSIS, ROUTINE W REFLEX MICROSCOPIC - Abnormal; Notable for the following:    APPearance CLOUDY (*)    All other components within normal limits  POC OCCULT BLOOD, ED - Abnormal; Notable for the following:    Fecal Occult Bld POSITIVE (*)    All other components within normal limits  I-STAT CG4 LACTIC ACID, ED - Abnormal; Notable for the following:    Lactic Acid, Venous 3.56 (*)    All other components within normal limits  CLOSTRIDIUM DIFFICILE BY PCR  LIPASE, BLOOD  COMPREHENSIVE METABOLIC PANEL  CBC  OCCULT BLOOD X 1 CARD TO LAB, STOOL  T-HELPER CELLS (CD4) COUNT  HEMOGLOBIN A1C  TSH    Imaging Review Dg Chest 2 View  05/10/2014   CLINICAL DATA:  Shortness of breath.  Former smoker.  EXAM: CHEST  2 VIEW  COMPARISON:  04/27/2014  FINDINGS: Heart size is normal. No  pleural effusion or edema. Lungs are hyperinflated and there are coarsened interstitial markings compatible with emphysema. Cavitary process involving the right upper lobe is again noted and appears improved from the previous exam. Left upper lobe opacity has resolved.  IMPRESSION: 1. Improving appearance of cavitary pneumonia involving the right upper lobe.   Electronically Signed   By: Kerby Moors M.D.   On: 05/10/2014 18:36   Ct Abdomen Pelvis W Contrast  05/10/2014   CLINICAL DATA:  Blood in stool with lower abdominal pain  EXAM: CT ABDOMEN AND PELVIS WITH CONTRAST  TECHNIQUE: Multidetector CT imaging of  the abdomen and pelvis was performed using the standard protocol following bolus administration of intravenous contrast.  CONTRAST:  4m OMNIPAQUE IOHEXOL 300 MG/ML  SOLN  COMPARISON:  04/25/2014  FINDINGS: Lung bases demonstrate less infiltrate when compare with the prior exam. Some changes of bronchiectasis are noted.  The liver shows some hypodensity stable from the prior exam likely representing small cysts. The spleen, adrenal glands, pancreas and gallbladder are within normal limits. The kidneys are well visualized bilaterally without renal calculi or urinary tract obstructive change. Cystic lesion is again noted in the left kidney.  Aortoiliac calcifications are again identified. The appendix is within normal limits. There is again noted diffuse thickening in the descending colon/sigmoid likely related to colitis. This would correspond with the patient's given clinical history. The bladder is well distended. No pelvic mass lesion is noted. Small retroperitoneal and porta hepatis lymph nodes are seen. These are stable from the prior exam.  IMPRESSION: Persistent diffuse thickening of the descending and sigmoid colon wall consistent with colitis. No new focal abnormality is noted.   Electronically Signed   By: MInez CatalinaM.D.   On: 05/10/2014 21:24     EKG Interpretation None      MDM    Final diagnoses:  Tachycardia  Dehydration  Colitis   This 53year-old female with history of HIV presents with multiple problems. On exam patient is frail cachectic with dry mucous membranes and appears dehydrated. She is significant only tachycardic, with presyncope when standing.  Laboratory studies obtained, notable for lactic acidosis, hypochloremic hyponatremia, with likely prerenal acute kidney injury. Patient hydrated with 2 L normal saline, with resolution of tachycardia and mild improvement in symptoms.  Also has significant abdominal pain and tenderness, considering her tachycardia and recent diagnosis of colitis, concern for intra-abdominal abscess. Repeat CT shows stable radiographic appearance of her colitis.  Patient has also been complaining of persistent bloody diarrhea, however hemoglobin today actually improved from previous, likely hemoconcentrated, but does not seem to be profoundly anemic.  We'll plan to admit to hospitalist for hydration and observation.  CLeata Mouse MD 05/11/14 [id number] RCarmin Muskrat MD 05/16/14 [phone]

## 2014-05-10 NOTE — H&P (Addendum)
[facility] History and Physical  [name] [name] ZWC:[id number] DOB: 1960-11-01 DOA: 05/10/2014  Referring physician: Leata Mouse MD PCP: No primary care provider on file.   Chief Complaint: Abdominal Pain  HPI: [name] [name] is a 53 y.o. female presents with abdominal pain. She was diagnosed with HIV in March. She states that she had been in denial and has not been really compliant with medical therapy. She states that she was recently admitted with a lung lesion. This was found to be growing MRSA and she was started on therapy with bactrim. She states that now she has been having pain in her abdomen. She states that she also had colitis on the last admission. Patient states she has not been eating and has not been drinking. She has had no radiation of the pain. She has felt very dizzy and feels as though she is going to pass out. She states that she has had diarrhea and there has been some blood in it. She states it is foul smelling.   Review of Systems:  Complete 10 point ROS negative other than what is noted in HPI  Past Medical History  Diagnosis Date  . Ulcerative colitis   . GERD (gastroesophageal reflux disease)   . Hypertension   . HIV (human immunodeficiency virus infection)   . Substance abuse   . GI bleed    Past Surgical History  Procedure Laterality Date  . Abdominal hysterectomy    . Video bronchoscopy Bilateral 04/27/2014    Procedure: VIDEO BRONCHOSCOPY WITH FLUORO;  Surgeon: Rigoberto Noel, MD;  Location: [facility] ENDOSCOPY;  Service: Cardiopulmonary;  Laterality: Bilateral;  . Tee without cardioversion N/A 05/02/2014    Procedure: TRANSESOPHAGEAL ECHOCARDIOGRAM (TEE);  Surgeon: Larey Dresser, MD;  Location: [place];  Service: Cardiovascular;  Laterality: N/A;   Social History:  reports that she has never smoked. She has never used smokeless tobacco. She reports that she uses illicit drugs (Cocaine). She reports that she does not drink alcohol.  No Known  Allergies  Family History  Problem Relation Age of Onset  . Cancer Father     ?  . Diabetes Mother   . Stroke Mother   . Alcoholism Father      Prior to Admission medications   Medication Sig Start Date End Date Taking? Authorizing Provider  amLODipine (NORVASC) 10 MG tablet Take 1 tablet (10 mg total) by mouth daily. 05/03/14  Yes Robbie Lis, MD  feeding supplement, ENSURE COMPLETE, (ENSURE COMPLETE) LIQD Take 237 mLs by mouth 3 (three) times daily between meals. 05/03/14  Yes Robbie Lis, MD  hydrALAZINE (APRESOLINE) 25 MG tablet Take 75 mg by mouth 3 (three) times daily.   Yes Historical Provider, MD  hydrochlorothiazide (HYDRODIURIL) 25 MG tablet Take 1 tablet (25 mg total) by mouth daily. 05/03/14  Yes Robbie Lis, MD  hyoscyamine (LEVSIN, ANASPAZ) 0.125 MG tablet Take 1 tablet (0.125 mg total) by mouth 2 (two) times daily as needed for cramping (abd cramping). 05/03/14  Yes Robbie Lis, MD  mirtazapine (REMERON SOL-TAB) 15 MG disintegrating tablet Take 1 tablet (15 mg total) by mouth at bedtime. 05/03/14  Yes Robbie Lis, MD  mupirocin ointment (BACTROBAN) 2 % Place 1 application into the nose 2 (two) times daily. 05/03/14  Yes Robbie Lis, MD  oxyCODONE-acetaminophen (PERCOCET) 5-325 MG per tablet Take 1-2 tablets by mouth every 4 (four) hours as needed for severe pain. 05/03/14  Yes Robbie Lis, MD  pantoprazole ([place])  40 MG tablet Take 1 tablet (40 mg total) by mouth 2 (two) times daily. 05/03/14  Yes Robbie Lis, MD  predniSONE (DELTASONE) 5 MG tablet Take 10 tablets (50 mg total) by mouth daily with breakfast. 05/03/14  Yes Robbie Lis, MD  sulfamethoxazole-trimethoprim (BACTRIM DS,SEPTRA DS) 800-160 MG per tablet Take 2 tablets by mouth 2 (two) times daily. 05/03/14  Yes Robbie Lis, MD  sulfaSALAzine (AZULFIDINE) 500 MG tablet Take 2 tablets (1,000 mg total) by mouth 2 (two) times daily. 05/03/14  Yes Robbie Lis, MD  zolpidem (AMBIEN) 5 MG tablet Take  1 tablet (5 mg total) by mouth at bedtime. 05/03/14  Yes Robbie Lis, MD  feeding supplement, RESOURCE BREEZE, (RESOURCE BREEZE) LIQD Take 1 Container by mouth 3 (three) times daily between meals. 05/03/14   Robbie Lis, MD  hydrocortisone (ANUSOL-HC) 2.5 % rectal cream Place rectally 3 (three) times daily as needed for hemorrhoids or itching. 05/03/14   Robbie Lis, MD   Physical Exam: Filed Vitals:   05/10/14 2007 05/10/14 2030 05/10/14 2045 05/10/14 2215  BP: 105/69 122/84  113/73  Pulse: 94 99 100 93  Temp:      Resp: 12 15 17    Height:      Weight:      SpO2: 99% 100% 100% 99%    Wt Readings from Last 3 Encounters:  05/10/14 45.36 kg (100 lb)  05/10/14 44.453 kg (98 lb)  04/25/14 47.628 kg (105 lb)    General:  Appears calm and comfortable Eyes: PERRL, normal lids, irises & conjunctiva ENT: grossly normal hearing, lips & tongue Neck: no LAD, masses or thyromegaly Cardiovascular: RRR, no m/r/g. No LE edema. Respiratory: CTA bilaterally, no w/r/r. Normal respiratory effort. Abdomen: soft, c/o lower quadrant pain Skin: no rash or induration seen on limited exam Musculoskeletal: grossly normal tone BUE/BLE Psychiatric: grossly normal mood and affect, speech fluent and appropriate Neurologic: grossly non-focal.          Labs on Admission:  Basic Metabolic Panel:  Recent Labs Lab 05/10/14 1714  NA 127*  K 4.8  CL 89*  CO2 19  GLUCOSE 300*  BUN 33*  CREATININE 1.20*  CALCIUM 9.3   Liver Function Tests:  Recent Labs Lab 05/10/14 1714  AST 25  ALT 21  ALKPHOS 608*  BILITOT 0.2*  PROT 8.4*  ALBUMIN 2.6*    Recent Labs Lab 05/10/14 1714  LIPASE 42   No results for input(s): AMMONIA in the last 168 hours. CBC:  Recent Labs Lab 05/10/14 1714  WBC 7.0  NEUTROABS 4.8  HGB 10.2*  HCT 30.1*  MCV 82.0  PLT 686*   Cardiac Enzymes: No results for input(s): CKTOTAL, CKMB, CKMBINDEX, TROPONINI in the last 168 hours.  BNP (last 3 results) No  results for input(s): PROBNP in the last 8760 hours. CBG: No results for input(s): GLUCAP in the last 168 hours.  Radiological Exams on Admission: Dg Chest 2 View  05/10/2014   CLINICAL DATA:  Shortness of breath.  Former smoker.  EXAM: CHEST  2 VIEW  COMPARISON:  04/27/2014  FINDINGS: Heart size is normal. No pleural effusion or edema. Lungs are hyperinflated and there are coarsened interstitial markings compatible with emphysema. Cavitary process involving the right upper lobe is again noted and appears improved from the previous exam. Left upper lobe opacity has resolved.  IMPRESSION: 1. Improving appearance of cavitary pneumonia involving the right upper lobe.   Electronically Signed   By: Lovena Le  Clovis Riley M.D.   On: 05/10/2014 18:36   Ct Abdomen Pelvis W Contrast  05/10/2014   CLINICAL DATA:  Blood in stool with lower abdominal pain  EXAM: CT ABDOMEN AND PELVIS WITH CONTRAST  TECHNIQUE: Multidetector CT imaging of the abdomen and pelvis was performed using the standard protocol following bolus administration of intravenous contrast.  CONTRAST:  48m OMNIPAQUE IOHEXOL 300 MG/ML  SOLN  COMPARISON:  04/25/2014  FINDINGS: Lung bases demonstrate less infiltrate when compare with the prior exam. Some changes of bronchiectasis are noted.  The liver shows some hypodensity stable from the prior exam likely representing small cysts. The spleen, adrenal glands, pancreas and gallbladder are within normal limits. The kidneys are well visualized bilaterally without renal calculi or urinary tract obstructive change. Cystic lesion is again noted in the left kidney.  Aortoiliac calcifications are again identified. The appendix is within normal limits. There is again noted diffuse thickening in the descending colon/sigmoid likely related to colitis. This would correspond with the patient's given clinical history. The bladder is well distended. No pelvic mass lesion is noted. Small retroperitoneal and porta hepatis lymph  nodes are seen. These are stable from the prior exam.  IMPRESSION: Persistent diffuse thickening of the descending and sigmoid colon wall consistent with colitis. No new focal abnormality is noted.   Electronically Signed   By: MInez CatalinaM.D.   On: 05/10/2014 21:24     Assessment/Plan Active Problems:   Abdominal pain   HIV positive   Cavitary lesion of lung   Non compliance with medical treatment   1. Abdominal pain -she will be admitted for further workup -she has evidence of colitis on the CT -recent admission and has been on antibiotics -will check C dif -start on cipro IV  2. HIV positive -she has been non-compliant with medical therapy -States that she was in denial -last CD4 was 140 will repeat  3. Cavitary lung lesion found to MRSA -apparent diagnosis made by bronchoscopy -will continue with bactrim  4. Non-compliance with medical therapy -patient was counseled on being compliant with medical therapy  5. Hyperglycemia -will monitor finger sticks and place on insulin coverage -likely related to prednisone  6. Hyponatremia -will hydrate with IVF NS started  7. Hypovolemia -her pressures were running low in the ED -will hold her BP medications for now    Code Status: Full Code (must indicate code status--if unknown or must be presumed, indicate so) DVT Prophylaxis:heparin Family Communication: None (indicate person spoken with, if applicable, with phone number if by telephone) Disposition Plan: Home (indicate anticipated LOS)  Time spent: 633m  Kaelan Emami A [facility] Pager [phone]

## 2014-05-10 NOTE — Assessment & Plan Note (Signed)
Will get her into dental at f/u

## 2014-05-10 NOTE — ED Notes (Addendum)
Patient returned from [place]. PLaced back on the monitor. Made aware of the plan of care.

## 2014-05-10 NOTE — ED Notes (Signed)
NOTIFIED DR. LOCKWOOD IN PERSON FOR PATIENTS LAB RESULTS OF CG4+ LACTIC ACID = 3.56 mmol/L ,@17 :50 PM ,05/10/2014.

## 2014-05-10 NOTE — Assessment & Plan Note (Signed)
Will have her eval in ED.  I am not sure if this cocaine (she denies use in 5 years), simple dehydration/malnutrition, worsening anemia from her UC (she denies gross BRBPR).

## 2014-05-10 NOTE — Progress Notes (Signed)
   Subjective:    Patient ID: [name] [name], female    DOB: [birth year], 53 y.o.   MRN: [id number]  HPI 53 yo F adm to [facility] 11-9 to 05-03-14 with cavitary RUL lesion. This grew MRSA on BAL. She had TEE (-). She had recently been dx with HIV+ (risk factor cocaine, denies IVDA) and in hospital was found to have CD4 140. She was treated with linezolid and bactrim and then d/c home on bactrim DS 2 bid for 1 month.   She also had recently been seen at [facility] and dx with Ulcerative Colitis. She was on prednisone for this.   HIV 1 RNA QUANT (copies/mL)  Date Value  04/28/2014 45997*   CD4 T CELL ABS (/uL)  Date Value  04/25/2014 140*  Genotype naive.   Today she complains of dizziness. Worse with rising. Has lost her appetite. Has been drinking cran-grape, water, ensure to try and stay hydrated.  Has been having chills at night which wake her up, she awakens "soaking wet". Cough has resolved.  No diarrhea.  Still taking her bactrim, prednisone. No yeast infections.   SOChx/FHx/PMHx reviewed, updated.   Review of Systems See HPI    Objective:   Physical Exam  Constitutional: She appears cachectic. She has a sickly appearance.  HENT:  Mouth/Throat: Abnormal dentition. Dental caries present. No oropharyngeal exudate.  Eyes: EOM are normal. Pupils are equal, round, and reactive to light.  Neck: Neck supple.  Cardiovascular: Tachycardia present.   Pulmonary/Chest: Effort normal and breath sounds normal.  Abdominal: Soft. Bowel sounds are normal. There is no tenderness.  Musculoskeletal: She exhibits no edema.  Lymphadenopathy:    She has no cervical adenopathy.  Neurological: She is alert.  Psychiatric: She has a normal mood and affect. Her behavior is normal.       Assessment & Plan:

## 2014-05-10 NOTE — ED Notes (Signed)
CT made aware the patient is finished with contrast.

## 2014-05-10 NOTE — ED Notes (Addendum)
Pt. Was send by Dr. [name] to be seen by [place].  (He stated, To call them if you need anything) .  Pt. Is having dehydration, colitis and dizziness and a elevated heart rate.  Pt. Having rt. Upper abdominal pain, denies any n/v/d.  Pt. Denies any chest pain.

## 2014-05-10 NOTE — ED Notes (Signed)
MD Rosana Hoes at the bedside.

## 2014-05-11 ENCOUNTER — Encounter (HOSPITAL_COMMUNITY): Payer: Self-pay | Admitting: *Deleted

## 2014-05-11 DIAGNOSIS — B20 Human immunodeficiency virus [HIV] disease: Secondary | ICD-10-CM | POA: Diagnosis present

## 2014-05-11 LAB — CBC
HCT: 27.6 % — ABNORMAL LOW (ref 36.0–46.0)
HCT: 27.3 % — ABNORMAL LOW (ref 36.0–46.0)
HEMOGLOBIN: 9.3 g/dL — AB (ref 12.0–15.0)
Hemoglobin: 9 g/dL — ABNORMAL LOW (ref 12.0–15.0)
MCH: 27.3 pg (ref 26.0–34.0)
MCH: 27.1 pg (ref 26.0–34.0)
MCHC: 33.7 g/dL (ref 30.0–36.0)
MCHC: 33 g/dL (ref 30.0–36.0)
MCV: 80.9 fL (ref 78.0–100.0)
MCV: 82.2 fL (ref 78.0–100.0)
PLATELETS: 508 10*3/uL — AB (ref 150–400)
Platelets: 545 10*3/uL — ABNORMAL HIGH (ref 150–400)
RBC: 3.41 MIL/uL — ABNORMAL LOW (ref 3.87–5.11)
RBC: 3.32 MIL/uL — ABNORMAL LOW (ref 3.87–5.11)
RDW: 18.4 % — AB (ref 11.5–15.5)
RDW: 18.2 % — AB (ref 11.5–15.5)
WBC: 7.4 10*3/uL (ref 4.0–10.5)
WBC: 7.6 10*3/uL (ref 4.0–10.5)

## 2014-05-11 LAB — GLUCOSE, CAPILLARY
GLUCOSE-CAPILLARY: 156 mg/dL — AB (ref 70–99)
Glucose-Capillary: 79 mg/dL (ref 70–99)
Glucose-Capillary: 151 mg/dL — ABNORMAL HIGH (ref 70–99)

## 2014-05-11 LAB — COMPREHENSIVE METABOLIC PANEL
ALK PHOS: 550 U/L — AB (ref 39–117)
ALT: 19 U/L (ref 0–35)
ANION GAP: 11 (ref 5–15)
AST: 21 U/L (ref 0–37)
Albumin: 2.5 g/dL — ABNORMAL LOW (ref 3.5–5.2)
BUN: 23 mg/dL (ref 6–23)
CO2: 23 mEq/L (ref 19–32)
Calcium: 8.7 mg/dL (ref 8.4–10.5)
Chloride: 92 mEq/L — ABNORMAL LOW (ref 96–112)
Creatinine, Ser: 0.96 mg/dL (ref 0.50–1.10)
GFR calc non Af Amer: 65 mL/min — ABNORMAL LOW (ref >90)
GFR, EST AFRICAN AMERICAN: 76 mL/min — AB (ref >90)
GLUCOSE: 107 mg/dL — AB (ref 70–99)
Potassium: 4.7 mEq/L (ref 3.7–5.3)
Sodium: 126 mEq/L — ABNORMAL LOW (ref 137–147)
Total Bilirubin: 0.2 mg/dL — ABNORMAL LOW (ref 0.3–1.2)
Total Protein: 7.6 g/dL (ref 6.0–8.3)

## 2014-05-11 LAB — T-HELPER CELLS (CD4) COUNT (NOT AT ARMC)
CD4 % Helper T Cell: 17 % — ABNORMAL LOW (ref 33–55)
CD4 T CELL ABS: 420 /uL (ref 400–2700)

## 2014-05-11 LAB — HEMOGLOBIN A1C
HEMOGLOBIN A1C: 5.5 % (ref <5.7)
MEAN PLASMA GLUCOSE: 111 mg/dL (ref <117)

## 2014-05-11 LAB — TSH: TSH: 2.54 u[IU]/mL (ref 0.350–4.500)

## 2014-05-11 LAB — CLOSTRIDIUM DIFFICILE BY PCR: Toxigenic C. Difficile by PCR: NEGATIVE

## 2014-05-11 LAB — OCCULT BLOOD X 1 CARD TO LAB, STOOL: FECAL OCCULT BLD: POSITIVE — AB

## 2014-05-11 MED ORDER — VITAMIN B-1 100 MG PO TABS
100.0000 mg | ORAL_TABLET | Freq: Every day | ORAL | Status: DC
  Administered 2014-05-11 – 2014-05-16 (×6): 100 mg via ORAL
  Filled 2014-05-11 (×6): qty 1

## 2014-05-11 MED ORDER — ZOLPIDEM TARTRATE 5 MG PO TABS
5.0000 mg | ORAL_TABLET | Freq: Every day | ORAL | Status: DC
  Administered 2014-05-11 – 2014-05-15 (×6): 5 mg via ORAL
  Filled 2014-05-11 (×6): qty 1

## 2014-05-11 MED ORDER — ADULT MULTIVITAMIN W/MINERALS CH
1.0000 | ORAL_TABLET | Freq: Every day | ORAL | Status: DC
  Administered 2014-05-11 – 2014-05-13 (×3): 1 via ORAL
  Filled 2014-05-11 (×3): qty 1

## 2014-05-11 MED ORDER — SULFASALAZINE 500 MG PO TABS
1000.0000 mg | ORAL_TABLET | Freq: Two times a day (BID) | ORAL | Status: DC
  Administered 2014-05-11 – 2014-05-16 (×12): 1000 mg via ORAL
  Filled 2014-05-11 (×13): qty 2

## 2014-05-11 MED ORDER — POLYETHYLENE GLYCOL 3350 17 G PO PACK: 17.0000 g | PACK | Freq: Every day | ORAL | Status: DC | PRN

## 2014-05-11 MED ORDER — PANTOPRAZOLE SODIUM 40 MG PO TBEC
40.0000 mg | DELAYED_RELEASE_TABLET | Freq: Two times a day (BID) | ORAL | Status: DC
  Administered 2014-05-11 – 2014-05-16 (×12): 40 mg via ORAL
  Filled 2014-05-11 (×12): qty 1

## 2014-05-11 MED ORDER — ENSURE COMPLETE PO LIQD
237.0000 mL | Freq: Three times a day (TID) | ORAL | Status: DC
  Administered 2014-05-11 – 2014-05-16 (×15): 237 mL via ORAL

## 2014-05-11 MED ORDER — OXYCODONE-ACETAMINOPHEN 5-325 MG PO TABS
1.0000 | ORAL_TABLET | ORAL | Status: DC | PRN
  Administered 2014-05-11 – 2014-05-16 (×10): 2 via ORAL
  Filled 2014-05-11 (×11): qty 2

## 2014-05-11 MED ORDER — DOCUSATE SODIUM 100 MG PO CAPS
100.0000 mg | ORAL_CAPSULE | Freq: Two times a day (BID) | ORAL | Status: DC
  Administered 2014-05-11: 100 mg via ORAL
  Filled 2014-05-11: qty 1

## 2014-05-11 MED ORDER — ONDANSETRON HCL 4 MG/2ML IJ SOLN
4.0000 mg | Freq: Four times a day (QID) | INTRAMUSCULAR | Status: DC | PRN
  Administered 2014-05-11: 4 mg via INTRAVENOUS
  Filled 2014-05-11: qty 2

## 2014-05-11 MED ORDER — CIPROFLOXACIN IN D5W 400 MG/200ML IV SOLN
400.0000 mg | Freq: Two times a day (BID) | INTRAVENOUS | Status: DC
  Administered 2014-05-11 – 2014-05-13 (×5): 400 mg via INTRAVENOUS
  Filled 2014-05-11 (×6): qty 200

## 2014-05-11 MED ORDER — MORPHINE SULFATE 2 MG/ML IJ SOLN
2.0000 mg | INTRAMUSCULAR | Status: DC | PRN
  Administered 2014-05-11 – 2014-05-16 (×22): 2 mg via INTRAVENOUS
  Filled 2014-05-11 (×22): qty 1

## 2014-05-11 MED ORDER — FLEET ENEMA 7-19 GM/118ML RE ENEM: 1.0000 | ENEMA | Freq: Once | RECTAL | Status: AC | PRN

## 2014-05-11 MED ORDER — PREDNISONE 50 MG PO TABS
50.0000 mg | ORAL_TABLET | Freq: Every day | ORAL | Status: DC
  Administered 2014-05-11 – 2014-05-16 (×6): 50 mg via ORAL
  Filled 2014-05-11 (×8): qty 1

## 2014-05-11 MED ORDER — ONDANSETRON HCL 4 MG PO TABS
4.0000 mg | ORAL_TABLET | Freq: Four times a day (QID) | ORAL | Status: DC | PRN
  Administered 2014-05-14: 4 mg via ORAL
  Filled 2014-05-11: qty 1

## 2014-05-11 MED ORDER — MUPIROCIN 2 % EX OINT
1.0000 "application " | TOPICAL_OINTMENT | Freq: Two times a day (BID) | CUTANEOUS | Status: DC
  Administered 2014-05-11 – 2014-05-16 (×12): 1 via NASAL
  Filled 2014-05-11: qty 22

## 2014-05-11 MED ORDER — MIRTAZAPINE 15 MG PO TBDP
15.0000 mg | ORAL_TABLET | Freq: Every day | ORAL | Status: DC
  Administered 2014-05-11 – 2014-05-15 (×6): 15 mg via ORAL
  Filled 2014-05-11 (×7): qty 1

## 2014-05-11 MED ORDER — SODIUM CHLORIDE 0.9 % IV SOLN
INTRAVENOUS | Status: DC
  Administered 2014-05-11 – 2014-05-13 (×6): via INTRAVENOUS

## 2014-05-11 MED ORDER — BISACODYL 10 MG RE SUPP: 10.0000 mg | Freq: Every day | RECTAL | Status: DC | PRN

## 2014-05-11 MED ORDER — HYOSCYAMINE SULFATE 0.125 MG PO TABS
0.1250 mg | ORAL_TABLET | Freq: Two times a day (BID) | ORAL | Status: DC | PRN
  Filled 2014-05-11: qty 1

## 2014-05-11 MED ORDER — INSULIN ASPART 100 UNIT/ML ~~LOC~~ SOLN
0.0000 [IU] | Freq: Three times a day (TID) | SUBCUTANEOUS | Status: DC
  Administered 2014-05-11: 2 [IU] via SUBCUTANEOUS
  Administered 2014-05-11 – 2014-05-12 (×2): 1 [IU] via SUBCUTANEOUS
  Administered 2014-05-12: 3 [IU] via SUBCUTANEOUS
  Administered 2014-05-13: 2 [IU] via SUBCUTANEOUS
  Administered 2014-05-13: 1 [IU] via SUBCUTANEOUS
  Administered 2014-05-14 (×2): 2 [IU] via SUBCUTANEOUS
  Administered 2014-05-15: 1 [IU] via SUBCUTANEOUS
  Administered 2014-05-15: 2 [IU] via SUBCUTANEOUS
  Administered 2014-05-16: 1 [IU] via SUBCUTANEOUS

## 2014-05-11 MED ORDER — HEPARIN SODIUM (PORCINE) 5000 UNIT/ML IJ SOLN
5000.0000 [IU] | Freq: Three times a day (TID) | INTRAMUSCULAR | Status: DC
  Administered 2014-05-11: 5000 [IU] via SUBCUTANEOUS
  Filled 2014-05-11 (×4): qty 1

## 2014-05-11 MED ORDER — SULFAMETHOXAZOLE-TRIMETHOPRIM 800-160 MG PO TABS
2.0000 | ORAL_TABLET | Freq: Two times a day (BID) | ORAL | Status: DC
  Administered 2014-05-11 – 2014-05-16 (×12): 2 via ORAL
  Filled 2014-05-11 (×15): qty 2

## 2014-05-11 MED ORDER — FOLIC ACID 1 MG PO TABS
1.0000 mg | ORAL_TABLET | Freq: Every day | ORAL | Status: DC
  Administered 2014-05-11 – 2014-05-16 (×6): 1 mg via ORAL
  Filled 2014-05-11 (×6): qty 1

## 2014-05-11 NOTE — Progress Notes (Addendum)
[facility] PROGRESS NOTE   [name] [name] ZOX:[id number] DOB: 10/15/1960 DOA: 05/10/2014 PCP: No primary care provider on file.  HPI/Subjective: Had 2 bloody bowel movement today.  Assessment/Plan: Active Problems:   Abdominal pain   HIV positive   Cavitary lesion of lung   Non compliance with medical treatment    UC flareup Patient admitted with abdominal pain and diarrhea. Has known history of ulcerative colitis CT scan of abdomen pelvis showed persistent colitis. Patient is still having loose stools. Start to have bloody bowel movement. Negative for C. difficile colitis. No evidence of opportunistic concurrent infection Continue steroids and IV fluid hydration, place patient is on clear liquids today.   AIDS HIV positive with CD4 count of 140. Patient saw Dr. [name] yesterday in the ID office, noncompliant with medical therapy. Her CD4 is repeated, pending.  Cavitary MRSA lesion in the right lung. Last recommendation from ID to treat with Zyvox for 1 month and continue Bactrim for prophylaxis. For some reason patient was discharged only on Bactrim.  Hyperglycemia Is likely secondary to steroids use.  Hyponatremia Presented with sodium of 127, cannot rule out dehydration versus SIADH from the lung cavitary lesion. Patient is on IV fluids, follow on sodium.  Dehydration/hypovolemia Presented with acute kidney injury, creatinine was 1.2. After hydration with IV fluids improved to 0.9.  Code Status: Full code Family Communication: Plan discussed with the patient. Disposition Plan: Remains inpatient   Consultants:  None  Procedures:  None  Antibiotics:  Cipro  Bactrim DS   Objective: Filed Vitals:   05/11/14 0552  BP: 115/80  Pulse: 90  Temp: 98 F (36.7 C)  Resp: 18    Intake/Output Summary (Last 24 hours) at 05/11/14 1319 Last data filed at 05/11/14 0900  Gross per 24 hour  Intake    840 ml  Output      0 ml  Net    840 ml    Filed Weights   05/10/14 1648 05/11/14 0038  Weight: 45.36 kg (100 lb) 44.453 kg (98 lb)    Exam: General: Alert and awake, oriented x3, not in any acute distress. HEENT: anicteric sclera, pupils reactive to light and accommodation, EOMI CVS: S1-S2 clear, no murmur rubs or gallops Chest: clear to auscultation bilaterally, no wheezing, rales or rhonchi Abdomen: soft nontender, nondistended, normal bowel sounds, no organomegaly Extremities: no cyanosis, clubbing or edema noted bilaterally Neuro: Cranial nerves II-XII intact, no focal neurological deficits  Data Reviewed: Basic Metabolic Panel:  Recent Labs Lab 05/10/14 1714 05/11/14 0100  NA 127* 126*  K 4.8 4.7  CL 89* 92*  CO2 19 23  GLUCOSE 300* 107*  BUN 33* 23  CREATININE 1.20* 0.96  CALCIUM 9.3 8.7   Liver Function Tests:  Recent Labs Lab 05/10/14 1714 05/11/14 0100  AST 25 21  ALT 21 19  ALKPHOS 608* 550*  BILITOT 0.2* 0.2*  PROT 8.4* 7.6  ALBUMIN 2.6* 2.5*    Recent Labs Lab 05/10/14 1714  LIPASE 42   No results for input(s): AMMONIA in the last 168 hours. CBC:  Recent Labs Lab 05/10/14 1714 05/11/14 0100 05/11/14 0926  WBC 7.0 7.4 7.6  NEUTROABS 4.8  --   --   HGB 10.2* 9.3* 9.0*  HCT 30.1* 27.6* 27.3*  MCV 82.0 80.9 82.2  PLT 686* 545* 508*   Cardiac Enzymes: No results for input(s): CKTOTAL, CKMB, CKMBINDEX, TROPONINI in the last 168 hours. BNP (last 3 results) No results for input(s): PROBNP in the last 8760  hours. CBG:  Recent Labs Lab 05/11/14 0745 05/11/14 1209  GLUCAP 79 156*    Micro Recent Results (from the past 240 hour(s))  Clostridium Difficile by PCR     Status: None   Collection Time: 05/11/14  8:12 AM  Result Value Ref Range Status   C difficile by pcr NEGATIVE NEGATIVE Final     Studies: Dg Chest 2 View  05/10/2014   CLINICAL DATA:  Shortness of breath.  Former smoker.  EXAM: CHEST  2 VIEW  COMPARISON:  04/27/2014  FINDINGS: Heart size is normal. No  pleural effusion or edema. Lungs are hyperinflated and there are coarsened interstitial markings compatible with emphysema. Cavitary process involving the right upper lobe is again noted and appears improved from the previous exam. Left upper lobe opacity has resolved.  IMPRESSION: 1. Improving appearance of cavitary pneumonia involving the right upper lobe.   Electronically Signed   By: Kerby Moors M.D.   On: 05/10/2014 18:36   Ct Abdomen Pelvis W Contrast  05/10/2014   CLINICAL DATA:  Blood in stool with lower abdominal pain  EXAM: CT ABDOMEN AND PELVIS WITH CONTRAST  TECHNIQUE: Multidetector CT imaging of the abdomen and pelvis was performed using the standard protocol following bolus administration of intravenous contrast.  CONTRAST:  80m OMNIPAQUE IOHEXOL 300 MG/ML  SOLN  COMPARISON:  04/25/2014  FINDINGS: Lung bases demonstrate less infiltrate when compare with the prior exam. Some changes of bronchiectasis are noted.  The liver shows some hypodensity stable from the prior exam likely representing small cysts. The spleen, adrenal glands, pancreas and gallbladder are within normal limits. The kidneys are well visualized bilaterally without renal calculi or urinary tract obstructive change. Cystic lesion is again noted in the left kidney.  Aortoiliac calcifications are again identified. The appendix is within normal limits. There is again noted diffuse thickening in the descending colon/sigmoid likely related to colitis. This would correspond with the patient's given clinical history. The bladder is well distended. No pelvic mass lesion is noted. Small retroperitoneal and porta hepatis lymph nodes are seen. These are stable from the prior exam.  IMPRESSION: Persistent diffuse thickening of the descending and sigmoid colon wall consistent with colitis. No new focal abnormality is noted.   Electronically Signed   By: MInez CatalinaM.D.   On: 05/10/2014 21:24    Scheduled Meds: . ciprofloxacin  400 mg  Intravenous Q12H  . docusate sodium  100 mg Oral BID  . feeding supplement (ENSURE COMPLETE)  237 mL Oral TID BM  . folic acid  1 mg Oral Daily  . heparin  5,000 Units Subcutaneous 3 times per day  . insulin aspart  0-9 Units Subcutaneous TID WC  . mirtazapine  15 mg Oral QHS  . multivitamin with minerals  1 tablet Oral Daily  . mupirocin ointment  1 application Nasal BID  . pantoprazole  40 mg Oral BID  . predniSONE  50 mg Oral Q breakfast  . sulfamethoxazole-trimethoprim  2 tablet Oral BID  . sulfaSALAzine  1,000 mg Oral BID  . thiamine  100 mg Oral Daily  . zolpidem  5 mg Oral QHS   Continuous Infusions: . sodium chloride 100 mL/hr at 05/11/14 1225       Time spent: 35 minutes    ENortheast Florida State HospitalA  [facility] Pager [phone] 7PM-7AM, please contact night-coverage at [email or web address], password [email or web address]11/25/2015, 1:19 PM  LOS: 1 day

## 2014-05-11 NOTE — Progress Notes (Signed)
MD made a aware of the bloody bowel movement, Md called with new orders.

## 2014-05-11 NOTE — Progress Notes (Addendum)
INITIAL NUTRITION ASSESSMENT  DOCUMENTATION CODES Per approved criteria  -Severe malnutrition in the context of chronic illness -Underweight   INTERVENTION: Ensure Complete po TID, each supplement provides 350 kcal and 13 grams of protein  Magic cup TID with meals, each supplement provides 290 kcal and 9 grams of protein  Encouraged high calorie/high protein foods at meals/snacks. Reviewed good sources of these.   NUTRITION DIAGNOSIS: Malnutrition related to chronic illness as evidenced by 28% weight loss x 2 months and severe fat and muscle depletion.   Goal: Pt to meet >/= 90% of their estimated nutrition needs   Monitor:  PO intake, supplement acceptance, weigh trends, labs   Reason for Assessment: Pt identified as at nutrition risk on the Malnutrition Screen Tool  52 y.o. female  Admitting Dx: Abdominal Pain  ASSESSMENT: Pt was diagnosed with HIV in March. She states that she had been in denial and has not been really compliant with medical therapy. Pt recently admitted with lung lesion - MRSA and colitis.    Per pt she began to lose weight about 2 months ago, 28% weight loss x 2 months. Daughter is doing the cooking, pt has been eating poorly. She started drinking ensure during last admission which was earlier this month and has continued to drink them at home.  Pt ate 50% of Breakfast this am. Pt agreeable to drinking ensure supplements here as well. Will also try magic cup.   Nutrition Focused Physical Exam:  Subcutaneous Fat:  Orbital Region: severe depletion  Upper Arm Region: severe depletion Thoracic and Lumbar Region: severe depletion  Muscle:  Temple Region: severe depletion Clavicle Bone Region: severe depletion Clavicle and Acromion Bone Region: severe depletion Scapular Bone Region: severe depletion Dorsal Hand: severe depletion Patellar Region: severe depletion Anterior Thigh Region: severe depletion Posterior Calf Region: severe depletion  Edema:  not present   Height: Ht Readings from Last 1 Encounters:  05/11/14 5' 9"  (1.753 m)    Weight: Wt Readings from Last 1 Encounters:  05/11/14 98 lb (44.453 kg)    Ideal Body Weight: 65.9 kg   % Ideal Body Weight: 67%  Wt Readings from Last 10 Encounters:  05/11/14 98 lb (44.453 kg)  05/10/14 98 lb (44.453 kg)  04/25/14 105 lb (47.628 kg)    Usual Body Weight: 136 lb   % Usual Body Weight: 72%  BMI:  Body mass index is 14.47 kg/(m^2).  Estimated Nutritional Needs: Kcal: 1500-1700 Protein: 80-90 gm  Fluid: >1.5L daily  Skin: intact  Diet Order: Diet regular Meal Completion: 80%  EDUCATION NEEDS: -No education needs identified at this time   Intake/Output Summary (Last 24 hours) at 05/11/14 1044 Last data filed at 05/11/14 0900  Gross per 24 hour  Intake    840 ml  Output      0 ml  Net    840 ml    Last BM: 11/25   Labs:   Recent Labs Lab 05/10/14 1714 05/11/14 0100  NA 127* 126*  K 4.8 4.7  CL 89* 92*  CO2 19 23  BUN 33* 23  CREATININE 1.20* 0.96  CALCIUM 9.3 8.7  GLUCOSE 300* 107*    CBG (last 3)   Recent Labs  05/11/14 0745  GLUCAP 79    Scheduled Meds: . ciprofloxacin  400 mg Intravenous Q12H  . docusate sodium  100 mg Oral BID  . feeding supplement (ENSURE COMPLETE)  237 mL Oral TID BM  . folic acid  1 mg Oral Daily  .  heparin  5,000 Units Subcutaneous 3 times per day  . insulin aspart  0-9 Units Subcutaneous TID WC  . mirtazapine  15 mg Oral QHS  . multivitamin with minerals  1 tablet Oral Daily  . mupirocin ointment  1 application Nasal BID  . pantoprazole  40 mg Oral BID  . predniSONE  50 mg Oral Q breakfast  . sulfamethoxazole-trimethoprim  2 tablet Oral BID  . sulfaSALAzine  1,000 mg Oral BID  . thiamine  100 mg Oral Daily  . zolpidem  5 mg Oral QHS    Continuous Infusions: . sodium chloride 100 mL/hr at 05/11/14 0204    Past Medical History  Diagnosis Date  . Ulcerative colitis   . GERD (gastroesophageal  reflux disease)   . Hypertension   . HIV (human immunodeficiency virus infection)   . Substance abuse   . GI bleed     Past Surgical History  Procedure Laterality Date  . Abdominal hysterectomy    . Video bronchoscopy Bilateral 04/27/2014    Procedure: VIDEO BRONCHOSCOPY WITH FLUORO;  Surgeon: Rigoberto Noel, MD;  Location: [facility];  Service: Cardiopulmonary;  Laterality: Bilateral;  . Tee without cardioversion N/A 05/02/2014    Procedure: TRANSESOPHAGEAL ECHOCARDIOGRAM (TEE);  Surgeon: Larey Dresser, MD;  Location: [facility];  Service: Cardiovascular;  Laterality: N/A;    Maylon Peppers RD, [place], [place] Pager [phone] After Hours Pager

## 2014-05-11 NOTE — ED Notes (Signed)
Patient transported upstairs.

## 2014-05-11 NOTE — Care Management Note (Signed)
  Page 1 of 1   05/16/2014     11:14:05 AM CARE MANAGEMENT NOTE 05/16/2014  Patient:  [name] [name]   Account Number:  [id number]  Date Initiated:  05/11/2014  Documentation initiated by:  Magdalen Spatz  Subjective/Objective Assessment:     Action/Plan:   Anticipated DC Date:  05/16/2014   Anticipated DC Plan:  HOME/SELF CARE  In-house referral  [facility]      Choice offered to / List presented to:             Status of service:   Medicare Important Message given?   (If response is "NO", the following Medicare IM given date fields will be blank) Date Medicare IM given:   Medicare IM given by:   Date Additional Medicare IM given:   Additional Medicare IM given by:    Discharge Disposition:    Per UR Regulation:  Reviewed for med. necessity/level of care/duration of stay  If discussed at [place] of Stay Meetings, dates discussed:    Comments:  05-16-14 Patient had recently used [facility] program , however, re entered patient in [facility] and explained letter . Stressed patient is not eligible for [facility] for another year . Patient voiced understanding .  Gave patient information and contact information on [facility] and [facility] . Patient states she will call to establish PCP and medication assistance.  Magdalen Spatz RN BSN [phone]

## 2014-05-11 NOTE — Progress Notes (Signed)
ANTIBIOTIC CONSULT NOTE - INITIAL  Pharmacy Consult for Cipro  Indication: Intra-abdominal infection  No Known Allergies  Patient Measurements: Height: 5' 9"  (175.3 cm) Weight: 98 lb (44.453 kg) IBW/kg (Calculated) : 66.2  Vital Signs: Temp: 97.9 F (36.6 C) (11/25 0038) Temp Source: Oral (11/25 0038) BP: 125/80 mmHg (11/25 0038) Pulse Rate: 95 (11/25 0038)  Labs:  Recent Labs  05/10/14 1714  WBC 7.0  HGB 10.2*  PLT 686*  CREATININE 1.20*   Estimated Creatinine Clearance: 37.2 mL/min (by C-G formula based on Cr of 1.2).   Medical History: Past Medical History  Diagnosis Date  . Ulcerative colitis   . GERD (gastroesophageal reflux disease)   . Hypertension   . HIV (human immunodeficiency virus infection)   . Substance abuse   . GI bleed     Assessment: 53 y/o F with HIV to start Cipro for r/o intra-abdominal infection. Already on Bactrim for cavitary RUL lesion growing MRSA. WBC good, slight bump in Scr, other labs as above.   Plan:  -Cipro 400 mg IV q12h -Trend WBC, temp, renal function   Narda Bonds 05/11/2014,12:48 AM

## 2014-05-12 DIAGNOSIS — B20 Human immunodeficiency virus [HIV] disease: Secondary | ICD-10-CM

## 2014-05-12 LAB — GLUCOSE, CAPILLARY
GLUCOSE-CAPILLARY: 83 mg/dL (ref 70–99)
Glucose-Capillary: 124 mg/dL — ABNORMAL HIGH (ref 70–99)
Glucose-Capillary: 203 mg/dL — ABNORMAL HIGH (ref 70–99)
Glucose-Capillary: 124 mg/dL — ABNORMAL HIGH (ref 70–99)

## 2014-05-12 LAB — FERRITIN: FERRITIN: 74 ng/mL (ref 10–291)

## 2014-05-12 LAB — IRON AND TIBC
Iron: 16 ug/dL — ABNORMAL LOW (ref 42–135)
Saturation Ratios: 7 % — ABNORMAL LOW (ref 20–55)
TIBC: 215 ug/dL — ABNORMAL LOW (ref 250–470)
UIBC: 199 ug/dL (ref 125–400)

## 2014-05-12 NOTE — Progress Notes (Signed)
[facility] PROGRESS NOTE   [name] [name] IOX:[id number] DOB: 08-Feb-1961 DOA: 05/10/2014 PCP: No primary care provider on file.  HPI/Subjective: No bloody bowel movements since yesterday.  Assessment/Plan: Active Problems:   Abdominal pain   HIV positive   Cavitary lesion of lung   Non compliance with medical treatment   AIDS    UC flareup Patient admitted with abdominal pain and diarrhea. Has known history of ulcerative colitis CT scan of abdomen pelvis showed persistent colitis. Patient is still having loose stools. Start to have bloody bowel movement. Negative for C. difficile colitis. No evidence of opportunistic concurrent infection Continue steroids and IV fluid hydration, patient is on regular diet tolerating that very well. Continue current management with steroids and mesalamine.  HIV HIV positive with CD4 count of 140 in beginning of this month. Repeat CD4 count is 420. Patient saw Dr. Johnnye [name] yesterday in the ID office, noncompliant with medical therapy. Patient will follow up with ID as outpatient.  Cavitary MRSA lesion in the right lung. Last recommendation from ID to treat with Zyvox for 1 month and continue Bactrim for prophylaxis. For some reason patient was discharged only on Bactrim. No fever, no chills, seems Bactrim is working we will continue.  Hyperglycemia Is likely secondary to steroids use. Hemoglobin A1c was checked and it is 5.5.  Hyponatremia Presented with sodium of 127, cannot rule out dehydration versus SIADH from the lung cavitary lesion. Patient is on IV fluids, follow on sodium.  Dehydration/hypovolemia Presented with acute kidney injury, creatinine was 1.2. After hydration with IV fluids improved to 0.9.  Code Status: Full code Family Communication: Plan discussed with the patient. Disposition Plan: Remains inpatient   Consultants:  None  Procedures:  None  Antibiotics:  Cipro  Bactrim DS   Objective: Filed  Vitals:   05/12/14 0548  BP: 126/73  Pulse: 106  Temp: 98.4 F (36.9 C)  Resp: 17    Intake/Output Summary (Last 24 hours) at 05/12/14 1020 Last data filed at 05/12/14 0600  Gross per 24 hour  Intake   2640 ml  Output      0 ml  Net   2640 ml   Filed Weights   05/10/14 1648 05/11/14 0038  Weight: 45.36 kg (100 lb) 44.453 kg (98 lb)    Exam: General: Alert and awake, oriented x3, not in any acute distress. HEENT: anicteric sclera, pupils reactive to light and accommodation, EOMI CVS: S1-S2 clear, no murmur rubs or gallops Chest: clear to auscultation bilaterally, no wheezing, rales or rhonchi Abdomen: soft nontender, nondistended, normal bowel sounds, no organomegaly Extremities: no cyanosis, clubbing or edema noted bilaterally Neuro: Cranial nerves II-XII intact, no focal neurological deficits  Data Reviewed: Basic Metabolic Panel:  Recent Labs Lab 05/10/14 1714 05/11/14 0100  NA 127* 126*  K 4.8 4.7  CL 89* 92*  CO2 19 23  GLUCOSE 300* 107*  BUN 33* 23  CREATININE 1.20* 0.96  CALCIUM 9.3 8.7   Liver Function Tests:  Recent Labs Lab 05/10/14 1714 05/11/14 0100  AST 25 21  ALT 21 19  ALKPHOS 608* 550*  BILITOT 0.2* 0.2*  PROT 8.4* 7.6  ALBUMIN 2.6* 2.5*    Recent Labs Lab 05/10/14 1714  LIPASE 42   No results for input(s): AMMONIA in the last 168 hours. CBC:  Recent Labs Lab 05/10/14 1714 05/11/14 0100 05/11/14 0926  WBC 7.0 7.4 7.6  NEUTROABS 4.8  --   --   HGB 10.2* 9.3* 9.0*  HCT 30.1* 27.6*  27.3*  MCV 82.0 80.9 82.2  PLT 686* 545* 508*   Cardiac Enzymes: No results for input(s): CKTOTAL, CKMB, CKMBINDEX, TROPONINI in the last 168 hours. BNP (last 3 results) No results for input(s): PROBNP in the last 8760 hours. CBG:  Recent Labs Lab 05/11/14 0745 05/11/14 1209 05/11/14 1705 05/12/14 0744  GLUCAP 79 156* 151* 83    Micro Recent Results (from the past 240 hour(s))  Clostridium Difficile by PCR     Status: None    Collection Time: 05/11/14  8:12 AM  Result Value Ref Range Status   C difficile by pcr NEGATIVE NEGATIVE Final     Studies: Dg Chest 2 View  05/10/2014   CLINICAL DATA:  Shortness of breath.  Former smoker.  EXAM: CHEST  2 VIEW  COMPARISON:  04/27/2014  FINDINGS: Heart size is normal. No pleural effusion or edema. Lungs are hyperinflated and there are coarsened interstitial markings compatible with emphysema. Cavitary process involving the right upper lobe is again noted and appears improved from the previous exam. Left upper lobe opacity has resolved.  IMPRESSION: 1. Improving appearance of cavitary pneumonia involving the right upper lobe.   Electronically Signed   By: Kerby Moors M.D.   On: 05/10/2014 18:36   Ct Abdomen Pelvis W Contrast  05/10/2014   CLINICAL DATA:  Blood in stool with lower abdominal pain  EXAM: CT ABDOMEN AND PELVIS WITH CONTRAST  TECHNIQUE: Multidetector CT imaging of the abdomen and pelvis was performed using the standard protocol following bolus administration of intravenous contrast.  CONTRAST:  39m OMNIPAQUE IOHEXOL 300 MG/ML  SOLN  COMPARISON:  04/25/2014  FINDINGS: Lung bases demonstrate less infiltrate when compare with the prior exam. Some changes of bronchiectasis are noted.  The liver shows some hypodensity stable from the prior exam likely representing small cysts. The spleen, adrenal glands, pancreas and gallbladder are within normal limits. The kidneys are well visualized bilaterally without renal calculi or urinary tract obstructive change. Cystic lesion is again noted in the left kidney.  Aortoiliac calcifications are again identified. The appendix is within normal limits. There is again noted diffuse thickening in the descending colon/sigmoid likely related to colitis. This would correspond with the patient's given clinical history. The bladder is well distended. No pelvic mass lesion is noted. Small retroperitoneal and porta hepatis lymph nodes are seen. These  are stable from the prior exam.  IMPRESSION: Persistent diffuse thickening of the descending and sigmoid colon wall consistent with colitis. No new focal abnormality is noted.   Electronically Signed   By: MInez CatalinaM.D.   On: 05/10/2014 21:24    Scheduled Meds: . ciprofloxacin  400 mg Intravenous Q12H  . feeding supplement (ENSURE COMPLETE)  237 mL Oral TID BM  . folic acid  1 mg Oral Daily  . insulin aspart  0-9 Units Subcutaneous TID WC  . mirtazapine  15 mg Oral QHS  . multivitamin with minerals  1 tablet Oral Daily  . mupirocin ointment  1 application Nasal BID  . pantoprazole  40 mg Oral BID  . predniSONE  50 mg Oral Q breakfast  . sulfamethoxazole-trimethoprim  2 tablet Oral BID  . sulfaSALAzine  1,000 mg Oral BID  . thiamine  100 mg Oral Daily  . zolpidem  5 mg Oral QHS   Continuous Infusions: . sodium chloride 100 mL/hr at 05/11/14 2328       Time spent: 35 minutes    [facility] Pager [phone] 7PM-7AM,  please contact night-coverage at [email or web address], password [facility] 05/12/2014, 10:20 AM  LOS: 2 days

## 2014-05-13 LAB — CBC
HCT: 20.5 % — ABNORMAL LOW (ref 36.0–46.0)
HCT: 23.5 % — ABNORMAL LOW (ref 36.0–46.0)
HEMOGLOBIN: 7.1 g/dL — AB (ref 12.0–15.0)
Hemoglobin: 7.7 g/dL — ABNORMAL LOW (ref 12.0–15.0)
MCH: 29.7 pg (ref 26.0–34.0)
MCH: 27.2 pg (ref 26.0–34.0)
MCHC: 34.6 g/dL (ref 30.0–36.0)
MCHC: 32.8 g/dL (ref 30.0–36.0)
MCV: 85.8 fL (ref 78.0–100.0)
MCV: 83 fL (ref 78.0–100.0)
PLATELETS: 492 10*3/uL — AB (ref 150–400)
Platelets: 434 10*3/uL — ABNORMAL HIGH (ref 150–400)
RBC: 2.39 MIL/uL — AB (ref 3.87–5.11)
RBC: 2.83 MIL/uL — ABNORMAL LOW (ref 3.87–5.11)
RDW: 18.2 % — ABNORMAL HIGH (ref 11.5–15.5)
RDW: 18.4 % — ABNORMAL HIGH (ref 11.5–15.5)
WBC: 5.6 10*3/uL (ref 4.0–10.5)
WBC: 6.8 10*3/uL (ref 4.0–10.5)

## 2014-05-13 LAB — PREPARE RBC (CROSSMATCH)

## 2014-05-13 LAB — BASIC METABOLIC PANEL
Anion gap: 10 (ref 5–15)
BUN: 10 mg/dL (ref 6–23)
CO2: 20 mEq/L (ref 19–32)
Calcium: 8 mg/dL — ABNORMAL LOW (ref 8.4–10.5)
Chloride: 104 mEq/L (ref 96–112)
Creatinine, Ser: 0.6 mg/dL (ref 0.50–1.10)
GFR calc Af Amer: >90 mL/min (ref >90)
GFR calc non Af Amer: >90 mL/min (ref >90)
GLUCOSE: 89 mg/dL (ref 70–99)
POTASSIUM: 4.1 meq/L (ref 3.7–5.3)
SODIUM: 134 meq/L — AB (ref 137–147)

## 2014-05-13 LAB — GLUCOSE, CAPILLARY
GLUCOSE-CAPILLARY: 150 mg/dL — AB (ref 70–99)
Glucose-Capillary: 82 mg/dL (ref 70–99)
Glucose-Capillary: 174 mg/dL — ABNORMAL HIGH (ref 70–99)
Glucose-Capillary: 122 mg/dL — ABNORMAL HIGH (ref 70–99)

## 2014-05-13 MED ORDER — SODIUM CHLORIDE 0.9 % IV SOLN
Freq: Once | INTRAVENOUS | Status: AC
  Administered 2014-05-13: 17:00:00 via INTRAVENOUS

## 2014-05-13 MED ORDER — CIPROFLOXACIN HCL 500 MG PO TABS
500.0000 mg | ORAL_TABLET | Freq: Two times a day (BID) | ORAL | Status: DC
  Administered 2014-05-13 – 2014-05-16 (×7): 500 mg via ORAL
  Filled 2014-05-13 (×9): qty 1

## 2014-05-13 NOTE — Progress Notes (Signed)
[facility] PROGRESS NOTE   [name] [name] [id number]:[id number] DOB: 06/28/1960 DOA: 05/10/2014 PCP: No primary care provider on file.  HPI/Subjective: Abdominal pain, nausea or improving. Patient has loose stools with very minimal blood.  Assessment/Plan: Active Problems:   Abdominal pain   HIV positive   Cavitary lesion of lung   Non compliance with medical treatment   AIDS    UC flareup Patient admitted with abdominal pain and diarrhea. Has known history of ulcerative colitis CT scan of abdomen pelvis showed persistent colitis. Patient is still having loose stools. Start to have bloody bowel movement. Negative for C. difficile colitis. No evidence of opportunistic concurrent infection Continue steroids and IV fluid hydration, patient is on regular diet tolerating that very well. Continue current management with oral steroids and sulfasalazine.  Anemia Patient has anemia of chronic disease with baseline hemoglobin of about 9-10. Patient presented to the hospital with hemoglobin of 10.2, and started to drop progressively tilts 7.7 today. Likely some acute blood loss plus hemo-dilution with IV fluids. I will transfuse 1 unit of packed RBCs. No overt bleeding, as mentioned above the stools are clear on up.  HIV HIV positive with CD4 count of 140 in beginning of this month. Repeat CD4 count is 420. Patient saw Dr. [name] yesterday in the ID office, noncompliant with medical therapy. Patient will follow up with ID as outpatient.  Cavitary MRSA lesion in the right lung. Last recommendation from ID to treat with Zyvox for 1 month and continue Bactrim for prophylaxis. For some reason patient was discharged only on Bactrim. No fever, no chills, seems Bactrim is working we will continue.  Hyperglycemia Is likely secondary to steroids use. Hemoglobin A1c was checked and it is 5.5.  Hyponatremia Presented with sodium of 127, cannot rule out dehydration versus SIADH from the lung  cavitary lesion. Patient is on IV fluids, improving with IV fluids, check CBC in a.m.  Dehydration/hypovolemia Presented with acute kidney injury, creatinine was 1.2. After hydration with IV fluids improved to 0.9.  Code Status: Full code Family Communication: Plan discussed with the patient. Disposition Plan: Remains inpatient   Consultants:  None  Procedures:  None  Antibiotics:  Cipro  Bactrim DS   Objective: Filed Vitals:   05/13/14 0509  BP: 124/73  Pulse: 103  Temp: 98.6 F (37 C)  Resp: 17    Intake/Output Summary (Last 24 hours) at 05/13/14 1152 Last data filed at 05/13/14 0923  Gross per 24 hour  Intake 3413.33 ml  Output      0 ml  Net 3413.33 ml   Filed Weights   05/10/14 1648 05/11/14 0038  Weight: 45.36 kg (100 lb) 44.453 kg (98 lb)    Exam: General: Alert and awake, oriented x3, not in any acute distress. HEENT: anicteric sclera, pupils reactive to light and accommodation, EOMI CVS: S1-S2 clear, no murmur rubs or gallops Chest: clear to auscultation bilaterally, no wheezing, rales or rhonchi Abdomen: soft nontender, nondistended, normal bowel sounds, no organomegaly Extremities: no cyanosis, clubbing or edema noted bilaterally Neuro: Cranial nerves II-XII intact, no focal neurological deficits  Data Reviewed: Basic Metabolic Panel:  Recent Labs Lab 05/10/14 1714 05/11/14 0100 05/13/14 0309  NA 127* 126* 134*  K 4.8 4.7 4.1  CL 89* 92* 104  CO2 19 23 20   GLUCOSE 300* 107* 89  BUN 33* 23 10  CREATININE 1.20* 0.96 0.60  CALCIUM 9.3 8.7 8.0*   Liver Function Tests:  Recent Labs Lab 05/10/14 1714 05/11/14 0100  AST 25 21  ALT 21 19  ALKPHOS 608* 550*  BILITOT 0.2* 0.2*  PROT 8.4* 7.6  ALBUMIN 2.6* 2.5*    Recent Labs Lab 05/10/14 1714  LIPASE 42   No results for input(s): AMMONIA in the last 168 hours. CBC:  Recent Labs Lab 05/10/14 1714 05/11/14 0100 05/11/14 0926 05/13/14 0309 05/13/14 1002  WBC 7.0 7.4  7.6 5.6 6.8  NEUTROABS 4.8  --   --   --   --   HGB 10.2* 9.3* 9.0* 7.1* 7.7*  HCT 30.1* 27.6* 27.3* 20.5* 23.5*  MCV 82.0 80.9 82.2 85.8 83.0  PLT 686* 545* 508* 434* 492*   Cardiac Enzymes: No results for input(s): CKTOTAL, CKMB, CKMBINDEX, TROPONINI in the last 168 hours. BNP (last 3 results) No results for input(s): PROBNP in the last 8760 hours. CBG:  Recent Labs Lab 05/12/14 0744 05/12/14 1141 05/12/14 1703 05/12/14 2126 05/13/14 0711  GLUCAP 83 124* 203* 124* 82    Micro Recent Results (from the past 240 hour(s))  Clostridium Difficile by PCR     Status: None   Collection Time: 05/11/14  8:12 AM  Result Value Ref Range Status   C difficile by pcr NEGATIVE NEGATIVE Final     Studies: No results found.  Scheduled Meds: . ciprofloxacin  400 mg Intravenous Q12H  . feeding supplement (ENSURE COMPLETE)  237 mL Oral TID BM  . folic acid  1 mg Oral Daily  . insulin aspart  0-9 Units Subcutaneous TID WC  . mirtazapine  15 mg Oral QHS  . multivitamin with minerals  1 tablet Oral Daily  . mupirocin ointment  1 application Nasal BID  . pantoprazole  40 mg Oral BID  . predniSONE  50 mg Oral Q breakfast  . sulfamethoxazole-trimethoprim  2 tablet Oral BID  . sulfaSALAzine  1,000 mg Oral BID  . thiamine  100 mg Oral Daily  . zolpidem  5 mg Oral QHS   Continuous Infusions: . sodium chloride 100 mL/hr at 05/13/14 1116       Time spent: 35 minutes    [facility] A  [facility] Pager [phone] If 7PM-7AM, please contact night-coverage at [email or web address], password [facility] 05/13/2014, 11:52 AM  LOS: 3 days

## 2014-05-13 NOTE — Progress Notes (Signed)
Hemoglobin this AM is 7.1. Fredirick Maudlin notified, no new order at this point.

## 2014-05-14 LAB — BASIC METABOLIC PANEL
ANION GAP: 12 (ref 5–15)
BUN: 12 mg/dL (ref 6–23)
CALCIUM: 7.9 mg/dL — AB (ref 8.4–10.5)
CO2: 17 mEq/L — ABNORMAL LOW (ref 19–32)
CREATININE: 0.75 mg/dL (ref 0.50–1.10)
Chloride: 103 mEq/L (ref 96–112)
GFR calc non Af Amer: >90 mL/min (ref >90)
Glucose, Bld: 85 mg/dL (ref 70–99)
Potassium: 4.2 mEq/L (ref 3.7–5.3)
Sodium: 132 mEq/L — ABNORMAL LOW (ref 137–147)

## 2014-05-14 LAB — CBC
HEMATOCRIT: 24.6 % — AB (ref 36.0–46.0)
Hemoglobin: 8.1 g/dL — ABNORMAL LOW (ref 12.0–15.0)
MCH: 27.6 pg (ref 26.0–34.0)
MCHC: 32.9 g/dL (ref 30.0–36.0)
MCV: 83.7 fL (ref 78.0–100.0)
PLATELETS: 454 10*3/uL — AB (ref 150–400)
RBC: 2.94 MIL/uL — ABNORMAL LOW (ref 3.87–5.11)
RDW: 18 % — AB (ref 11.5–15.5)
WBC: 6.7 10*3/uL (ref 4.0–10.5)

## 2014-05-14 LAB — TYPE AND SCREEN
ABO/RH(D): B POS
ANTIBODY SCREEN: NEGATIVE
Unit division: 0

## 2014-05-14 LAB — ABO/RH: ABO/RH(D): B POS

## 2014-05-14 LAB — GLUCOSE, CAPILLARY
GLUCOSE-CAPILLARY: 77 mg/dL (ref 70–99)
GLUCOSE-CAPILLARY: 176 mg/dL — AB (ref 70–99)
Glucose-Capillary: 179 mg/dL — ABNORMAL HIGH (ref 70–99)
Glucose-Capillary: 151 mg/dL — ABNORMAL HIGH (ref 70–99)

## 2014-05-14 NOTE — Plan of Care (Signed)
Problem: Phase III Progression Outcomes Goal: Pain controlled on oral analgesia Outcome: Completed/Met Date Met:  05/14/14 Goal: Activity at appropriate level-compared to baseline (UP IN CHAIR FOR HEMODIALYSIS)  Outcome: Completed/Met Date Met:  05/14/14 Goal: Voiding independently Outcome: Completed/Met Date Met:  05/14/14 Goal: Foley discontinued Outcome: Completed/Met Date Met:  05/14/14

## 2014-05-14 NOTE — Progress Notes (Signed)
[facility] PROGRESS NOTE   [name] [name] [id number]:[id number] DOB: 1960-11-03 DOA: 05/10/2014 PCP: No primary care provider on file.  HPI/Subjective: Reported feeling better overall Patient has loose stools with very minimal blood. The late   Assessment/Plan: Active Problems:   Abdominal pain   HIV positive   Cavitary lesion of lung   Non compliance with medical treatment   AIDS    UC flareup Patient admitted with abdominal pain and diarrhea. Has known history of ulcerative colitis CT scan of abdomen pelvis showed persistent colitis. Patient is still having loose stools. Start to have bloody bowel movement. Negative for C. difficile colitis. No evidence of opportunistic concurrent infection Continue steroids and IV fluid hydration, patient is on regular diet tolerating that very well. Continue current management with oral steroids and sulfasalazine.  Anemia Patient has anemia of chronic disease with baseline hemoglobin of about 9-10. Patient presented to the hospital with hemoglobin of 10.2, and started to drop progressively tilts 7.7 today. Likely some acute blood loss plus hemo-dilution with IV fluids. I will transfuse 1 unit of packed RBCs. No overt bleeding, as her stools does not have blood now. Status post transfusion of one unit of packed RBCs, hemoglobin increased to 8.1 from 7.7 , Check CBC in a.m.   HIV HIV positive with CD4 count of 140 in beginning of this month. Repeat CD4 count is 420. Patient saw Dr. [name] yesterday in the ID office, noncompliant with medical therapy. Patient will follow up with ID as outpatient.  Cavitary MRSA lesion in the right lung. Last recommendation from ID to treat with Zyvox for 1 month and continue Bactrim for prophylaxis. For some reason patient was discharged only on Bactrim. No fever, no chills, seems Bactrim is working we will continue.  Hyperglycemia Is likely secondary to steroids use. Hemoglobin A1c was checked and it  is 5.5.  Hyponatremia Presented with sodium of 127, cannot rule out dehydration versus SIADH from the lung cavitary lesion. Patient is on IV fluids, improving with IV fluids, check CBC in a.m.  Dehydration/hypovolemia Presented with acute kidney injury, creatinine was 1.2. After hydration with IV fluids improved to 0.9.  Code Status: Full code Family Communication: Plan discussed with the patient. Disposition Plan: Remains inpatient   Consultants:  None  Procedures:  None  Antibiotics:  Cipro  Bactrim DS   Objective: Filed Vitals:   05/14/14 0533  BP: 133/82  Pulse: 97  Temp: 98.7 F (37.1 C)  Resp: 16    Intake/Output Summary (Last 24 hours) at 05/14/14 1200 Last data filed at 05/14/14 0915  Gross per 24 hour  Intake 1651.67 ml  Output      0 ml  Net 1651.67 ml   Filed Weights   05/10/14 1648 05/11/14 0038  Weight: 45.36 kg (100 lb) 44.453 kg (98 lb)    Exam: General: Alert and awake, oriented x3, not in any acute distress. HEENT: anicteric sclera, pupils reactive to light and accommodation, EOMI CVS: S1-S2 clear, no murmur rubs or gallops Chest: clear to auscultation bilaterally, no wheezing, rales or rhonchi Abdomen: soft nontender, nondistended, normal bowel sounds, no organomegaly Extremities: no cyanosis, clubbing or edema noted bilaterally Neuro: Cranial nerves II-XII intact, no focal neurological deficits  Data Reviewed: Basic Metabolic Panel:  Recent Labs Lab 05/10/14 1714 05/11/14 0100 05/13/14 0309 05/14/14 0235  NA 127* 126* 134* 132*  K 4.8 4.7 4.1 4.2  CL 89* 92* 104 103  CO2 19 23 20  17*  GLUCOSE 300* 107* 89 85  BUN 33* 23 10 12   CREATININE 1.20* 0.96 0.60 0.75  CALCIUM 9.3 8.7 8.0* 7.9*   Liver Function Tests:  Recent Labs Lab 05/10/14 1714 05/11/14 0100  AST 25 21  ALT 21 19  ALKPHOS 608* 550*  BILITOT 0.2* 0.2*  PROT 8.4* 7.6  ALBUMIN 2.6* 2.5*    Recent Labs Lab 05/10/14 1714  LIPASE 42   No results  for input(s): AMMONIA in the last 168 hours. CBC:  Recent Labs Lab 05/10/14 1714 05/11/14 0100 05/11/14 0926 05/13/14 0309 05/13/14 1002 05/14/14 0235  WBC 7.0 7.4 7.6 5.6 6.8 6.7  NEUTROABS 4.8  --   --   --   --   --   HGB 10.2* 9.3* 9.0* 7.1* 7.7* 8.1*  HCT 30.1* 27.6* 27.3* 20.5* 23.5* 24.6*  MCV 82.0 80.9 82.2 85.8 83.0 83.7  PLT 686* 545* 508* 434* 492* 454*   Cardiac Enzymes: No results for input(s): CKTOTAL, CKMB, CKMBINDEX, TROPONINI in the last 168 hours. BNP (last 3 results) No results for input(s): PROBNP in the last 8760 hours. CBG:  Recent Labs Lab 05/13/14 1156 05/13/14 1723 05/13/14 2156 05/14/14 0720 05/14/14 1143  GLUCAP 174* 150* 122* 77 179*    Micro Recent Results (from the past 240 hour(s))  Clostridium Difficile by PCR     Status: None   Collection Time: 05/11/14  8:12 AM  Result Value Ref Range Status   C difficile by pcr NEGATIVE NEGATIVE Final     Studies: No results found.  Scheduled Meds: . ciprofloxacin  500 mg Oral BID  . feeding supplement (ENSURE COMPLETE)  237 mL Oral TID BM  . folic acid  1 mg Oral Daily  . insulin aspart  0-9 Units Subcutaneous TID WC  . mirtazapine  15 mg Oral QHS  . mupirocin ointment  1 application Nasal BID  . pantoprazole  40 mg Oral BID  . predniSONE  50 mg Oral Q breakfast  . sulfamethoxazole-trimethoprim  2 tablet Oral BID  . sulfaSALAzine  1,000 mg Oral BID  . thiamine  100 mg Oral Daily  . zolpidem  5 mg Oral QHS   Continuous Infusions:       Time spent: 35 minutes    [facility] A  [facility] Pager [phone] If 7PM-7AM, please contact night-coverage at [email or web address], password [facility] 05/14/2014, 12:00 PM  LOS: 4 days

## 2014-05-15 LAB — CBC
HCT: 25.6 % — ABNORMAL LOW (ref 36.0–46.0)
Hemoglobin: 8.5 g/dL — ABNORMAL LOW (ref 12.0–15.0)
MCH: 28.1 pg (ref 26.0–34.0)
MCHC: 33.2 g/dL (ref 30.0–36.0)
MCV: 84.8 fL (ref 78.0–100.0)
PLATELETS: 493 10*3/uL — AB (ref 150–400)
RBC: 3.02 MIL/uL — AB (ref 3.87–5.11)
RDW: 18.6 % — AB (ref 11.5–15.5)
WBC: 7.4 10*3/uL (ref 4.0–10.5)

## 2014-05-15 LAB — BASIC METABOLIC PANEL
Anion gap: 13 (ref 5–15)
BUN: 16 mg/dL (ref 6–23)
CALCIUM: 8.6 mg/dL (ref 8.4–10.5)
CO2: 19 mEq/L (ref 19–32)
CREATININE: 0.66 mg/dL (ref 0.50–1.10)
Chloride: 100 mEq/L (ref 96–112)
GFR calc non Af Amer: >90 mL/min (ref >90)
GLUCOSE: 86 mg/dL (ref 70–99)
POTASSIUM: 4.5 meq/L (ref 3.7–5.3)
Sodium: 132 mEq/L — ABNORMAL LOW (ref 137–147)

## 2014-05-15 LAB — GLUCOSE, CAPILLARY
GLUCOSE-CAPILLARY: 94 mg/dL (ref 70–99)
Glucose-Capillary: 138 mg/dL — ABNORMAL HIGH (ref 70–99)
Glucose-Capillary: 162 mg/dL — ABNORMAL HIGH (ref 70–99)
Glucose-Capillary: 143 mg/dL — ABNORMAL HIGH (ref 70–99)

## 2014-05-15 MED ORDER — SODIUM CHLORIDE 0.9 % IV SOLN
500.0000 mg | Freq: Once | INTRAVENOUS | Status: AC
  Administered 2014-05-15: 500 mg via INTRAVENOUS
  Filled 2014-05-15: qty 10

## 2014-05-15 MED ORDER — SODIUM CHLORIDE 0.9 % IV SOLN
25.0000 mg | Freq: Once | INTRAVENOUS | Status: AC
  Administered 2014-05-15: 25 mg via INTRAVENOUS
  Filled 2014-05-15: qty 0.5

## 2014-05-15 NOTE — Progress Notes (Signed)
[facility] PROGRESS NOTE   [name] [name] [id number] DOB: 1961-05-17 DOA: 05/10/2014 PCP: No primary care provider on file.  HPI/Subjective: Reported feeling better overall. Had bowel movement this morning, loose without blood, continue current management. Plan for discharge in the morning.  Assessment/Plan: Active Problems:   Abdominal pain   HIV positive   Cavitary lesion of lung   Non compliance with medical treatment   AIDS    UC flareup Patient admitted with abdominal pain and diarrhea. Has known history of ulcerative colitis CT scan of abdomen pelvis showed persistent colitis. Patient is still having loose stools. Start to have bloody bowel movement. Negative for C. difficile colitis. No evidence of opportunistic concurrent infection Continue steroids and IV fluid hydration, patient is on regular diet tolerating that very well. Continue current management with oral steroids and sulfasalazine.  Anemia Patient has anemia of chronic disease with baseline hemoglobin of about 9-10. Patient presented to the hospital with hemoglobin of 10.2, and started to drop progressively tilts 7.7 today. Likely some acute blood loss plus hemo-dilution with IV fluids. Status post transfusion of one unit of packed RBCs, hemoglobin increased to 8.1 from 7.7 , Check CBC in a.m.  Patient has iron of 16 and ferritin of 74, anemia of chronic disease with low iron, give 1 dose of IV iron today.  HIV HIV positive with CD4 count of 140 in beginning of this month. Repeat CD4 count is 420. Patient saw Dr. [name] yesterday in the ID office, noncompliant with medical therapy. Patient will follow up with ID as outpatient.  Cavitary MRSA lesion in the right lung. Last recommendation from ID to treat with Zyvox for 1 month and continue Bactrim for prophylaxis. For some reason patient was discharged only on Bactrim. No fever, no chills, seems Bactrim is working we will  continue.  Hyperglycemia Is likely secondary to steroids use. Hemoglobin A1c was checked and it is 5.5.  Hyponatremia Presented with sodium of 127, cannot rule out dehydration versus SIADH from the lung cavitary lesion. Patient is on IV fluids, improving with IV fluids, check CBC in a.m.  Dehydration/hypovolemia Presented with acute kidney injury, creatinine was 1.2. After hydration with IV fluids improved to 0.9.  Code Status: Full code Family Communication: Plan discussed with the patient. Disposition Plan: Remains inpatient   Consultants:  None  Procedures:  None  Antibiotics:  Cipro  Bactrim DS   Objective: Filed Vitals:   05/15/14 0458  BP: 120/62  Pulse: 85  Temp: 98 F (36.7 C)  Resp: 16    Intake/Output Summary (Last 24 hours) at 05/15/14 1107 Last data filed at 05/15/14 0951  Gross per 24 hour  Intake   1060 ml  Output      0 ml  Net   1060 ml   Filed Weights   05/10/14 1648 05/11/14 0038  Weight: 45.36 kg (100 lb) 44.453 kg (98 lb)    Exam: General: Alert and awake, oriented x3, not in any acute distress. HEENT: anicteric sclera, pupils reactive to light and accommodation, EOMI CVS: S1-S2 clear, no murmur rubs or gallops Chest: clear to auscultation bilaterally, no wheezing, rales or rhonchi Abdomen: soft nontender, nondistended, normal bowel sounds, no organomegaly Extremities: no cyanosis, clubbing or edema noted bilaterally Neuro: Cranial nerves II-XII intact, no focal neurological deficits  Data Reviewed: Basic Metabolic Panel:  Recent Labs Lab 05/10/14 1714 05/11/14 0100 05/13/14 0309 05/14/14 0235 05/15/14 0440  NA 127* 126* 134* 132* 132*  K 4.8 4.7 4.1 4.2 4.5  CL 89* 92* 104 103 100  CO2 19 23 20  17* 19  GLUCOSE 300* 107* 89 85 86  BUN 33* 23 10 12 16   CREATININE 1.20* 0.96 0.60 0.75 0.66  CALCIUM 9.3 8.7 8.0* 7.9* 8.6   Liver Function Tests:  Recent Labs Lab 05/10/14 1714 05/11/14 0100  AST 25 21  ALT 21 19   ALKPHOS 608* 550*  BILITOT 0.2* 0.2*  PROT 8.4* 7.6  ALBUMIN 2.6* 2.5*    Recent Labs Lab 05/10/14 1714  LIPASE 42   No results for input(s): AMMONIA in the last 168 hours. CBC:  Recent Labs Lab 05/10/14 1714  05/11/14 0926 05/13/14 0309 05/13/14 1002 05/14/14 0235 05/15/14 0440  WBC 7.0  < > 7.6 5.6 6.8 6.7 7.4  NEUTROABS 4.8  --   --   --   --   --   --   HGB 10.2*  < > 9.0* 7.1* 7.7* 8.1* 8.5*  HCT 30.1*  < > 27.3* 20.5* 23.5* 24.6* 25.6*  MCV 82.0  < > 82.2 85.8 83.0 83.7 84.8  PLT 686*  < > 508* 434* 492* 454* 493*  < > = values in this interval not displayed. Cardiac Enzymes: No results for input(s): CKTOTAL, CKMB, CKMBINDEX, TROPONINI in the last 168 hours. BNP (last 3 results) No results for input(s): PROBNP in the last 8760 hours. CBG:  Recent Labs Lab 05/14/14 0720 05/14/14 1143 05/14/14 1728 05/14/14 2143 05/15/14 0720  GLUCAP 77 179* 176* 151* 94    Micro Recent Results (from the past 240 hour(s))  Clostridium Difficile by PCR     Status: None   Collection Time: 05/11/14  8:12 AM  Result Value Ref Range Status   C difficile by pcr NEGATIVE NEGATIVE Final     Studies: No results found.  Scheduled Meds: . ciprofloxacin  500 mg Oral BID  . feeding supplement (ENSURE COMPLETE)  237 mL Oral TID BM  . folic acid  1 mg Oral Daily  . insulin aspart  0-9 Units Subcutaneous TID WC  . mirtazapine  15 mg Oral QHS  . mupirocin ointment  1 application Nasal BID  . pantoprazole  40 mg Oral BID  . predniSONE  50 mg Oral Q breakfast  . sulfamethoxazole-trimethoprim  2 tablet Oral BID  . sulfaSALAzine  1,000 mg Oral BID  . thiamine  100 mg Oral Daily  . zolpidem  5 mg Oral QHS   Continuous Infusions:       Time spent: 35 minutes    [facility]  [facility] Pager [phone] If 7PM-7AM, please contact night-coverage at [email or web address], password [facility] 05/15/2014, 11:07 AM  LOS: 5 days

## 2014-05-16 DIAGNOSIS — K51911 Ulcerative colitis, unspecified with rectal bleeding: Secondary | ICD-10-CM

## 2014-05-16 LAB — GLUCOSE, CAPILLARY
GLUCOSE-CAPILLARY: 77 mg/dL (ref 70–99)
Glucose-Capillary: 128 mg/dL — ABNORMAL HIGH (ref 70–99)

## 2014-05-16 LAB — CBC
HEMATOCRIT: 23.9 % — AB (ref 36.0–46.0)
HEMOGLOBIN: 8 g/dL — AB (ref 12.0–15.0)
MCH: 28.4 pg (ref 26.0–34.0)
MCHC: 33.5 g/dL (ref 30.0–36.0)
MCV: 84.8 fL (ref 78.0–100.0)
Platelets: 520 10*3/uL — ABNORMAL HIGH (ref 150–400)
RBC: 2.82 MIL/uL — AB (ref 3.87–5.11)
RDW: 18.8 % — ABNORMAL HIGH (ref 11.5–15.5)
WBC: 8.9 10*3/uL (ref 4.0–10.5)

## 2014-05-16 MED ORDER — PREDNISONE 10 MG PO TABS: ORAL_TABLET | ORAL | Status: DC

## 2014-05-16 MED ORDER — FERROUS SULFATE 325 (65 FE) MG PO TABS: 325.0000 mg | ORAL_TABLET | Freq: Every day | ORAL | Status: DC

## 2014-05-16 NOTE — Progress Notes (Signed)
Discussed discharge summary with patient. Reviewed all medications with patient. Patient received Rx. Patient ready for discharge. 

## 2014-05-16 NOTE — Discharge Summary (Signed)
Physician Discharge Summary  [name] [name] W[facility]:[id number] DOB: 1961/03/03 DOA: 05/10/2014  PCP: No primary care provider on file.  Admit date: 05/10/2014 Discharge date: 05/16/2014  Time spent: 40 minutes  Recommendations for Outpatient Follow-up:  1. Follow-up with Dr. [name] for HIV infection. 2. Follow-up with PCP/gastroenterology for ulcerative colitis.  Discharge Diagnoses:  Principal Problem:   Ulcerative colitis, acute Active Problems:   Abdominal pain   HIV positive   Cavitary lesion of lung   Non compliance with medical treatment   AIDS   Discharge Condition: Stable.  Diet recommendation: Heart healthy  Filed Weights   05/10/14 1648 05/11/14 0038  Weight: 45.36 kg (100 lb) 44.453 kg (98 lb)    History of present illness:  [name] [name] is a 53 y.o. female presents with abdominal pain. She was diagnosed with HIV in March. She states that she had been in denial and has not been really compliant with medical therapy. She states that she was recently admitted with a lung lesion. This was found to be growing MRSA and she was started on therapy with bactrim. She states that now she has been having pain in her abdomen. She states that she also had colitis on the last admission. Patient states she has not been eating and has not been drinking. She has had no radiation of the pain. She has felt very dizzy and feels as though she is going to pass out. She states that she has had diarrhea and there has been some blood in it. She states it is foul smelling.  Hospital Course:    UC flareup Patient admitted with abdominal pain and diarrhea. Has known history of ulcerative colitis CT scan of abdomen/pelvis showed persistent colitis. Patient complained about low stools which is blunting sometimes. Negative for C. difficile colitis. No evidence of opportunistic concurrent infection Treated with IVF hydration and her oral steroids continued. She tolerated regular diet for the past 3  days without problems. Sulfasalazine continued at the time of discharge. Placed on prolonged prednisone taper, if she can get off of prednisone.  Anemia Patient has anemia of chronic disease with baseline hemoglobin of about 9-10. Patient presented to the hospital with hemoglobin of 10.2, and started to drop progressively with nadir of 7.7. Likely some acute blood loss from bloody stools plus hemo-dilution with IV fluids. Status post transfusion of one unit of packed RBCs, hemoglobin increased to 8.1 from 7.7 , Check CBC in a.m.  Patient has iron of 16 and ferritin of 74, anemia of chronic disease with low iron, given 500 mg of INFeD. Patient discharged on oral supplements of iron, hemoglobin is 8.0 at the time of discharge.  HIV HIV positive with CD4 count of 140 in beginning of this month. Repeat CD4 count is 420. Patient saw Dr. [name] prior to admission in the ID office, noncompliant with medical therapy. Patient will follow up with ID as outpatient.  Cavitary MRSA PNA in the right lung. Last recommendation from ID to treat with Zyvox for 1 month and continue Bactrim for prophylaxis. For some reason patient was discharged only on Bactrim DS. No fever, no chills, seems Bactrim is working well,  we will continue.  Hyperglycemia Is likely secondary to steroids use. Hemoglobin A1c was checked and it is 5.5.  Hyponatremia Presented with sodium of 127, likely secondary to dehydration, sodium improved to 132 at the time of discharge. Patient is on IV fluids, improving with IV fluids, check CBC in a.m.  Dehydration/hypovolemia Presented with acute kidney  injury, creatinine was 1.2. After hydration with IV fluids improved to 0.9.  Procedures:  None  Consultations:  None  Discharge Exam: Filed Vitals:   05/16/14 0500  BP: 128/78  Pulse: 100  Temp: 98 F (36.7 C)  Resp: 18   General: Alert and awake, oriented x3, not in any acute distress. HEENT: anicteric sclera, pupils  reactive to light and accommodation, EOMI CVS: S1-S2 clear, no murmur rubs or gallops Chest: clear to auscultation bilaterally, no wheezing, rales or rhonchi Abdomen: soft nontender, nondistended, normal bowel sounds, no organomegaly Extremities: no cyanosis, clubbing or edema noted bilaterally Neuro: Cranial nerves II-XII intact, no focal neurological deficits  Discharge Instructions You were cared for by a hospitalist during your hospital stay. If you have any questions about your discharge medications or the care you received while you were in the hospital after you are discharged, you can call the unit and asked to speak with the hospitalist on call if the hospitalist that took care of you is not available. Once you are discharged, your primary care physician will handle any further medical issues. Please note that NO REFILLS for any discharge medications will be authorized once you are discharged, as it is imperative that you return to your primary care physician (or establish a relationship with a primary care physician if you do not have one) for your aftercare needs so that they can reassess your need for medications and monitor your lab values.  Discharge Instructions    Diet - low sodium heart healthy    Complete by:  As directed      Increase activity slowly    Complete by:  As directed           Current Discharge Medication List    START taking these medications   Details  ferrous sulfate (FERROUSUL) 325 (65 FE) MG tablet Take 1 tablet (325 mg total) by mouth daily with breakfast. Qty: 30 tablet, Refills: 0      CONTINUE these medications which have CHANGED   Details  predniSONE (DELTASONE) 10 MG tablet Take 4 tabs orally by mouth for 7 days, then take 3 tabs orally by mouth for 7 days, then take 2 tabs orally by mouth for 7 days, then take 1 tabs orally by mouth for 7 days, then stop. Qty: 70 tablet, Refills: 0      CONTINUE these medications which have NOT CHANGED    Details  amLODipine (NORVASC) 10 MG tablet Take 1 tablet (10 mg total) by mouth daily. Qty: 30 tablet, Refills: 0    !! feeding supplement, ENSURE COMPLETE, (ENSURE COMPLETE) LIQD Take 237 mLs by mouth 3 (three) times daily between meals. Qty: 237 mL, Refills: 0    hydrALAZINE (APRESOLINE) 25 MG tablet Take 75 mg by mouth 3 (three) times daily.    hydrochlorothiazide (HYDRODIURIL) 25 MG tablet Take 1 tablet (25 mg total) by mouth daily. Qty: 30 tablet, Refills: 0    hyoscyamine (LEVSIN, ANASPAZ) 0.125 MG tablet Take 1 tablet (0.125 mg total) by mouth 2 (two) times daily as needed for cramping (abd cramping). Qty: 30 tablet, Refills: 0    mirtazapine (REMERON SOL-TAB) 15 MG disintegrating tablet Take 1 tablet (15 mg total) by mouth at bedtime. Qty: 30 tablet, Refills: 0    mupirocin ointment (BACTROBAN) 2 % Place 1 application into the nose 2 (two) times daily. Qty: 22 g, Refills: 0    oxyCODONE-acetaminophen (PERCOCET) 5-325 MG per tablet Take 1-2 tablets by mouth every 4 (four)  hours as needed for severe pain. Qty: 45 tablet, Refills: 0    pantoprazole (PROTONIX) 40 MG tablet Take 1 tablet (40 mg total) by mouth 2 (two) times daily. Qty: 30 tablet, Refills: 0    sulfamethoxazole-trimethoprim (BACTRIM DS,SEPTRA DS) 800-160 MG per tablet Take 2 tablets by mouth 2 (two) times daily. Qty: 120 tablet, Refills: 0    sulfaSALAzine (AZULFIDINE) 500 MG tablet Take 2 tablets (1,000 mg total) by mouth 2 (two) times daily. Qty: 60 tablet, Refills: 0    zolpidem (AMBIEN) 5 MG tablet Take 1 tablet (5 mg total) by mouth at bedtime. Qty: 30 tablet, Refills: 0    !! feeding supplement, RESOURCE BREEZE, (RESOURCE BREEZE) LIQD Take 1 Container by mouth 3 (three) times daily between meals. Qty: 1 Container, Refills: 0    hydrocortisone (ANUSOL-HC) 2.5 % rectal cream Place rectally 3 (three) times daily as needed for hemorrhoids or itching. Qty: 30 g, Refills: 0     !! - Potential duplicate  medications found. Please discuss with provider.     No Known Allergies Follow-up Information    Follow up with Bobby Rumpf, MD In 1 week.   Specialty:  Infectious Diseases   Contact information:   [address] [place] [address] [phone]        The results of significant diagnostics from this hospitalization (including imaging, microbiology, ancillary and laboratory) are listed below for reference.    Significant Diagnostic Studies: Dg Chest 2 View  05/10/2014   CLINICAL DATA:  Shortness of breath.  Former smoker.  EXAM: CHEST  2 VIEW  COMPARISON:  04/27/2014  FINDINGS: Heart size is normal. No pleural effusion or edema. Lungs are hyperinflated and there are coarsened interstitial markings compatible with emphysema. Cavitary process involving the right upper lobe is again noted and appears improved from the previous exam. Left upper lobe opacity has resolved.  IMPRESSION: 1. Improving appearance of cavitary pneumonia involving the right upper lobe.   Electronically Signed   By: Kerby Moors M.D.   On: 05/10/2014 18:36   Dg Chest 2 View  04/25/2014   CLINICAL DATA:  Weakness for 3 weeks.  EXAM: CHEST  2 VIEW  COMPARISON:  None.  FINDINGS: Ill-defined right upper lobe infiltrate with cavitation is noted. Subtle patchy nodular infiltrates in the left mid lung and left lower lobe noted. These findings are consistent with active infectious of infiltrates including granulomas infiltrates. Cavitation in the right upper lobe infiltrate may be related to developing abscess. Cavitary tumor cannot be excluded. Close follow-up chest x-rays to demonstrate clearing suggested. Prominent nipple shadows noted. Cardiomegaly with normal pulmonary vascularity. No pleural effusion or pneumothorax. No acute or focal bony abnormality.  IMPRESSION: 1. Prominent cavitary infiltrate right upper lobe. These findings are most likely infectious. Active granulomatous disease cannot be excluded.  Pulmonary abscess cannot be excluded. 2. Patchy infiltrates left mid lung field a left lower lobe consistent with pneumonia. Close follow-up chest x-rays are suggested to demonstrate clearing of these findings. Underlying tumor particularly in the right upper lobe cannot be excluded.   Electronically Signed   By: Marcello Moores  Register   On: 04/25/2014 17:11   Ct Chest Wo Contrast  04/26/2014   CLINICAL DATA:  Short of breath and 35 lb weight loss.  EXAM: CT CHEST WITHOUT CONTRAST  TECHNIQUE: Multidetector CT imaging of the chest was performed following the standard protocol without IV contrast.  COMPARISON:  12/21/2013  FINDINGS: Mediastinum: The heart size is normal. There  is calcified atherosclerotic disease involving the thoracic aorta as well as the LAD, left circumflex and RCA coronary arteries. There is no mediastinal or hilar adenopathy.  Lungs/Pleura: No pleural effusion identified. Multiple bilateral cavitary lesions are identified with surrounding ground-glass attenuation. The largest is in the right upper lobe an measures 4.4 x 4.2 cm, image 20/ series 5. Smaller non cavitary nodules are scattered throughout both lungs.  Upper Abdomen: No acute findings identified within the upper abdomen.  Musculoskeletal: Review of the visualized bony structures is unremarkable. No aggressive lytic or sclerotic bone lesions.  IMPRESSION: 1. Examination is positive for multi focal, bilateral cavitary lesions with surrounding allowing glass attenuation. In in a patient who is immunocompromised findings are concerning for infection including pulmonary tuberculosis, septic emboli and atypical infections. Certain malignancy such as squamous cell carcinoma may also cavitate. 2. No significant mediastinal or hilar adenopathy. 3. Atherosclerotic disease including multi vessel coronary artery calcification.   Electronically Signed   By: Kerby Moors M.D.   On: 04/26/2014 09:04   Ct Abdomen Pelvis W Contrast  05/10/2014    CLINICAL DATA:  Blood in stool with lower abdominal pain  EXAM: CT ABDOMEN AND PELVIS WITH CONTRAST  TECHNIQUE: Multidetector CT imaging of the abdomen and pelvis was performed using the standard protocol following bolus administration of intravenous contrast.  CONTRAST:  70m OMNIPAQUE IOHEXOL 300 MG/ML  SOLN  COMPARISON:  04/25/2014  FINDINGS: Lung bases demonstrate less infiltrate when compare with the prior exam. Some changes of bronchiectasis are noted.  The liver shows some hypodensity stable from the prior exam likely representing small cysts. The spleen, adrenal glands, pancreas and gallbladder are within normal limits. The kidneys are well visualized bilaterally without renal calculi or urinary tract obstructive change. Cystic lesion is again noted in the left kidney.  Aortoiliac calcifications are again identified. The appendix is within normal limits. There is again noted diffuse thickening in the descending colon/sigmoid likely related to colitis. This would correspond with the patient's given clinical history. The bladder is well distended. No pelvic mass lesion is noted. Small retroperitoneal and porta hepatis lymph nodes are seen. These are stable from the prior exam.  IMPRESSION: Persistent diffuse thickening of the descending and sigmoid colon wall consistent with colitis. No new focal abnormality is noted.   Electronically Signed   By: MInez CatalinaM.D.   On: 05/10/2014 21:24   Ct Abdomen Pelvis W Contrast  04/25/2014   CLINICAL DATA:  Lower abdominal pain. Cough, weakness. Rectal bleeding. History of ulcerative colitis, hysterectomy, hepatitis-C. History of hypertension and HIV.  EXAM: CT ABDOMEN AND PELVIS WITH CONTRAST  TECHNIQUE: Multidetector CT imaging of the abdomen and pelvis was performed using the standard protocol following bolus administration of intravenous contrast.  CONTRAST:  1019mOMNIPAQUE IOHEXOL 300 MG/ML  SOLN  COMPARISON:  None.  FINDINGS: Lower chest: Patchy densities are  identified at the lung bases, associated a small cavitary lesions. The heart size is normal.  Upper abdomen: Nonspecific low-attenuation lesion is identified within the posterior right hepatic lobe measuring 7 mm in diameter. A similar nodule is identified in the left hepatic lobe on image 18, also measuring 7 mm in diameter. There is mild periportal edema. No focal abnormality identified within the spleen or pancreas. The adrenal glands have a normal appearance. Kidneys show symmetric bilateral excretion. Small left renal cyst is 8 mm in diameter. Gallbladder is present.  Gastrointestinal tract: The stomach and small bowel loops are normal in appearance. There is diffuse  thickening of the ascending, transverse, descending, and sigmoid colonic wall consistent with colitis.  Pelvis: Urinary bladder has a normal appearance. The uterus is absent. No adnexal mass identified.  Retroperitoneum: There are small retroperitoneal and mesenteric lymph nodes. Periportal lymph nodes appear fluid and somewhat indistinct. The largest left periaortic lymph node measures 1.6 cm in diameter. There is atherosclerotic calcification of the abdominal aorta. No aneurysm.  Abdominal wall: Unremarkable.  Osseous structures: Unremarkable.  IMPRESSION: 1. Numerous cavitary lesions at the lung bases with considerations including inflammatory and infectious processes. Vasculitis can have a similar appearance but is felt to be less likely. Consider further evaluation with chest x-Afshar and/or chest CT. 2. Significant wall thickening of the entire colon, most notable in the distal aspect, favored to be infectious. Inflammatory causes could have a similar appearance. 3. Periportal and retroperitoneal adenopathy. 4. No abscess or bowel obstruction.   Electronically Signed   By: Shon Hale M.D.   On: 04/25/2014 16:19   Dg Chest Port 1 View  04/27/2014   CLINICAL DATA:  Post bronchoscopy and RIGHT upper lobe biopsy  EXAM: PORTABLE CHEST - 1 VIEW   COMPARISON:  Portable exam 0941 hr compared to CT chest 04/26/2014  FINDINGS: Borderline enlargement of cardiac silhouette.  Mediastinal contours and pulmonary vascularity normal.  Cavitary opacity in RIGHT upper lobe again identified corresponding to cavitary lesion on CT.  Scattered RIGHT upper lobe and LEFT perihilar infiltrates present.  No pneumothorax post bronchoscopy.  Remaining lungs clear.  No pleural effusion.  IMPRESSION: No pneumothorax post bronchoscopy.  Persisting cavitary lesion in the RIGHT upper lobe and mild scattered infiltrates, favor infectious.   Electronically Signed   By: Lavonia Dana M.D.   On: 04/27/2014 09:58   Dg C-arm Bronchoscopy  04/27/2014   CLINICAL DATA:    C-ARM BRONCHOSCOPY  Fluoroscopy was utilized by the requesting physician.  No radiographic  interpretation.     Microbiology: Recent Results (from the past 240 hour(s))  Clostridium Difficile by PCR     Status: None   Collection Time: 05/11/14  8:12 AM  Result Value Ref Range Status   C difficile by pcr NEGATIVE NEGATIVE Final     Labs: Basic Metabolic Panel:  Recent Labs Lab 05/10/14 1714 05/11/14 0100 05/13/14 0309 05/14/14 0235 05/15/14 0440  NA 127* 126* 134* 132* 132*  K 4.8 4.7 4.1 4.2 4.5  CL 89* 92* 104 103 100  CO2 19 23 20  17* 19  GLUCOSE 300* 107* 89 85 86  BUN 33* 23 10 12 16   CREATININE 1.20* 0.96 0.60 0.75 0.66  CALCIUM 9.3 8.7 8.0* 7.9* 8.6   Liver Function Tests:  Recent Labs Lab 05/10/14 1714 05/11/14 0100  AST 25 21  ALT 21 19  ALKPHOS 608* 550*  BILITOT 0.2* 0.2*  PROT 8.4* 7.6  ALBUMIN 2.6* 2.5*    Recent Labs Lab 05/10/14 1714  LIPASE 42   No results for input(s): AMMONIA in the last 168 hours. CBC:  Recent Labs Lab 05/10/14 1714  05/13/14 0309 05/13/14 1002 05/14/14 0235 05/15/14 0440 05/16/14 0302  WBC 7.0  < > 5.6 6.8 6.7 7.4 8.9  NEUTROABS 4.8  --   --   --   --   --   --   HGB 10.2*  < > 7.1* 7.7* 8.1* 8.5* 8.0*  HCT 30.1*  < > 20.5*  23.5* 24.6* 25.6* 23.9*  MCV 82.0  < > 85.8 83.0 83.7 84.8 84.8  PLT 686*  < >  434* 492* 454* 493* 520*  < > = values in this interval not displayed. Cardiac Enzymes: No results for input(s): CKTOTAL, CKMB, CKMBINDEX, TROPONINI in the last 168 hours. BNP: BNP (last 3 results) No results for input(s): PROBNP in the last 8760 hours. CBG:  Recent Labs Lab 05/15/14 0720 05/15/14 1119 05/15/14 1703 05/15/14 2151 05/16/14 0741  GLUCAP 94 138* 162* 143* 77       Signed:  Ranen Doolin A  [facility] 05/16/2014, 9:59 AM

## 2014-05-16 NOTE — Progress Notes (Signed)
Reviewed discharge instructions with the patient.  Also reinforced information that [name] [name] from case manager had given her regarding f/u with getting a PCP.  Pt verbalized understanding of all discharge instructions and follow up information.  2 prescriptions also given to patient.  No questions or concerns at this time.  Pt ready for discharge home.

## 2014-05-19 NOTE — Discharge Summary (Signed)
Addendum to the D/C summary dictated by me on 05/17/14.  -Severe malnutrition in the context of chronic illness -Underweight Pt seen by the dietitian while she was in the hospital and recommended the appropriate diet.  Birdie Hopes Pager: [phone] 05/19/2014, 12:41 PM

## 2014-05-23 LAB — FUNGUS CULTURE W SMEAR: Fungal Smear: NONE SEEN

## 2014-05-27 ENCOUNTER — Encounter: Payer: Self-pay | Admitting: Family Medicine

## 2014-05-27 ENCOUNTER — Ambulatory Visit: Payer: Medicaid Other | Attending: Family Medicine | Admitting: Family Medicine

## 2014-05-27 VITALS — BP 131/83 | HR 104 | Temp 99.1°F | Resp 16 | Ht 69.0 in | Wt 111.0 lb

## 2014-05-27 DIAGNOSIS — Z792 Long term (current) use of antibiotics: Secondary | ICD-10-CM | POA: Diagnosis not present

## 2014-05-27 DIAGNOSIS — Z9119 Patient's noncompliance with other medical treatment and regimen: Secondary | ICD-10-CM | POA: Diagnosis not present

## 2014-05-27 DIAGNOSIS — I10 Essential (primary) hypertension: Secondary | ICD-10-CM | POA: Insufficient documentation

## 2014-05-27 DIAGNOSIS — Z79899 Other long term (current) drug therapy: Secondary | ICD-10-CM | POA: Diagnosis not present

## 2014-05-27 DIAGNOSIS — Z21 Asymptomatic human immunodeficiency virus [HIV] infection status: Secondary | ICD-10-CM

## 2014-05-27 DIAGNOSIS — K5190 Ulcerative colitis, unspecified, without complications: Secondary | ICD-10-CM | POA: Insufficient documentation

## 2014-05-27 DIAGNOSIS — B20 Human immunodeficiency virus [HIV] disease: Secondary | ICD-10-CM | POA: Diagnosis not present

## 2014-05-27 DIAGNOSIS — Z87891 Personal history of nicotine dependence: Secondary | ICD-10-CM | POA: Diagnosis not present

## 2014-05-27 DIAGNOSIS — G4700 Insomnia, unspecified: Secondary | ICD-10-CM

## 2014-05-27 DIAGNOSIS — D638 Anemia in other chronic diseases classified elsewhere: Secondary | ICD-10-CM

## 2014-05-27 DIAGNOSIS — K51911 Ulcerative colitis, unspecified with rectal bleeding: Secondary | ICD-10-CM

## 2014-05-27 MED ORDER — SULFASALAZINE 500 MG PO TABS: 1000.0000 mg | ORAL_TABLET | Freq: Two times a day (BID) | ORAL | Status: DC

## 2014-05-27 MED ORDER — MIRTAZAPINE 15 MG PO TBDP: 15.0000 mg | ORAL_TABLET | Freq: Every day | ORAL | Status: DC

## 2014-05-27 MED ORDER — FERROUS SULFATE 325 (65 FE) MG PO TABS: 325.0000 mg | ORAL_TABLET | Freq: Every day | ORAL | Status: DC

## 2014-05-27 MED ORDER — HYDRALAZINE HCL 25 MG PO TABS: 75.0000 mg | ORAL_TABLET | Freq: Three times a day (TID) | ORAL | Status: DC

## 2014-05-27 MED ORDER — HYOSCYAMINE SULFATE 0.125 MG PO TABS: 0.1250 mg | ORAL_TABLET | Freq: Two times a day (BID) | ORAL | Status: DC | PRN

## 2014-05-27 MED ORDER — AMLODIPINE BESYLATE 10 MG PO TABS: 10.0000 mg | ORAL_TABLET | Freq: Every day | ORAL | Status: DC

## 2014-05-27 MED ORDER — ACETAMINOPHEN-CODEINE #3 300-30 MG PO TABS: 1.0000 | ORAL_TABLET | Freq: Four times a day (QID) | ORAL | Status: DC | PRN

## 2014-05-27 MED ORDER — HYDROCHLOROTHIAZIDE 25 MG PO TABS: 25.0000 mg | ORAL_TABLET | Freq: Every day | ORAL | Status: DC

## 2014-05-27 NOTE — Assessment & Plan Note (Signed)
A; on PCP prophylaxis, ready to be compliant with  HAART P: Keep appt with ID

## 2014-05-27 NOTE — Assessment & Plan Note (Signed)
A; BP and pulse controlled P: Continue norvasc, hydralazin and HCTZ

## 2014-05-27 NOTE — Progress Notes (Signed)
   Subjective:    Patient ID: [name] [name], female    DOB: Aug 27, 1960, 53 y.o.   MRN: [id number] CC: HFU for ulcerative colitis, HIV non compliant with HAART  HPI  1. Ulcerative colitis: taking prednisone taper down to 30 mg daily. Doing well with moderate abdominal cramping in the LLQ. No fever, chills, sweats, N/V/D/melena or hematochezia. Request refill for pain medication (prescribed percocet upon d/c), sulfasalazine and hyoscyamine.   2. HIV: not on HAART. Previously in denial and non compliant with therapy. No ready to take therapy and work on her health. Taking bactrim BID as prescribed.   Soc Hx: former smoker, quit 04/2014 Med Hx:  Review of Systems As per HPI     Objective:   Physical Exam BP 131/83 mmHg  Pulse 104  Temp(Src) 99.1 F (37.3 C) (Oral)  Resp 16  Ht 5' 9"  (1.753 m)  Wt 111 lb (50.349 kg)  BMI 16.38 kg/m2  SpO2 100%  Wt Readings from Last 3 Encounters:  05/27/14 111 lb (50.349 kg)  05/11/14 98 lb (44.453 kg)  05/10/14 98 lb (44.453 kg)  General appearance: alert, cooperative and no distress, appears chronically ill,  Lungs: clear to auscultation bilaterally Heart: S1, S2 normal, elevated HR  Abdomen: NABS, soft, mild TTP LLQ with fullness, no rebound or guarding.  Extremities: extremities normal, atraumatic, no cyanosis or edema except trace edema in feet b/l       Assessment & Plan:

## 2014-05-27 NOTE — Assessment & Plan Note (Addendum)
A: improving P: Refilled sulfasalazine and hyoscyamine.  Continue prednisone taper GI referral  Stepped down from percocet to tylenol #3 for pain control

## 2014-05-27 NOTE — Patient Instructions (Addendum)
[name] [name],  Thank you for coming in today. It was a pleasure meeting you. I look forward to being your primary doctor.  I have refilled all your medications. We have stepped down from percocet to tylenol #3, $10 co-pay.   I have placed a referral to GI for your ulcerative colitis you will need insurance, [place] discount, orange card, medicare/medicaid to process this referral.   Please continue prednisone taper call if you get down to 20 mg or 10 mg and abdominal pain becomes severe.   F/u in 4 weeks.   Dr. Adrian Blackwater

## 2014-05-27 NOTE — Progress Notes (Signed)
Establish Care HFU Colon problem Constant diarrhea  State doing better now, gaining wt Stop Smoking one month ago Medicine refills

## 2014-06-06 ENCOUNTER — Encounter: Payer: Self-pay | Admitting: Infectious Diseases

## 2014-06-06 ENCOUNTER — Ambulatory Visit (INDEPENDENT_AMBULATORY_CARE_PROVIDER_SITE_OTHER): Payer: Medicaid Other | Admitting: Infectious Diseases

## 2014-06-06 ENCOUNTER — Other Ambulatory Visit: Payer: Self-pay | Admitting: *Deleted

## 2014-06-06 ENCOUNTER — Encounter: Payer: Self-pay | Admitting: Gastroenterology

## 2014-06-06 VITALS — BP 148/84 | HR 93 | Temp 98.1°F | Ht 69.0 in | Wt 112.0 lb

## 2014-06-06 DIAGNOSIS — Z21 Asymptomatic human immunodeficiency virus [HIV] infection status: Secondary | ICD-10-CM

## 2014-06-06 DIAGNOSIS — K029 Dental caries, unspecified: Secondary | ICD-10-CM

## 2014-06-06 DIAGNOSIS — E43 Unspecified severe protein-calorie malnutrition: Secondary | ICD-10-CM

## 2014-06-06 DIAGNOSIS — K51919 Ulcerative colitis, unspecified with unspecified complications: Secondary | ICD-10-CM

## 2014-06-06 MED ORDER — ELVITEG-COBIC-EMTRICIT-TENOFDF 150-150-200-300 MG PO TABS: 1.0000 | ORAL_TABLET | Freq: Every day | ORAL | Status: DC

## 2014-06-06 MED ORDER — ELVITEG-COBIC-EMTRICIT-TENOFAF 150-150-200-10 MG PO TABS: 1.0000 | ORAL_TABLET | Freq: Every day | ORAL | Status: DC

## 2014-06-06 NOTE — Progress Notes (Signed)
   Subjective:    Patient ID: [name] [name], female    DOB: [birth year], 53 y.o.   MRN: [id number]  HPI 53 yo F adm to [facility] 11-9 to 05-03-14 with cavitary RUL lesion (MRSA on BAL). She had TEE (-). She had recently been dx with HIV+ (risk factor cocaine, denies IVDA) and in hospital was found to have CD4 140. She was treated with linezolid and bactrim and then d/c home on bactrim DS 2 bid for 1 month.  She also had recently dx with Ulcerative Colitis.   Naive genotype, HLA +.   She was seen for 1st visit for HIV on 11-24 and adm from clinic for UC flare. She was treated medically and then d/c home on 11-30.   HIV 1 RNA QUANT (copies/mL)  Date Value  04/28/2014 45997*   CD4 T CELL ABS (/uL)  Date Value  05/11/2014 420  04/25/2014 140*   Today she complains of continued abd pain. She was given tylenol #3 at d/c which has not been sufficient. Was previously  Given percocet which worked well.  States she is taking all her medicine, cannot name. Has been gaining wt. Has gotten disability as well.   Review of Systems  Constitutional: Negative for appetite change and unexpected weight change.  Gastrointestinal: Positive for abdominal pain. Negative for diarrhea and constipation.  Genitourinary: Negative for difficulty urinating.       Objective:   Physical Exam  Constitutional: She appears cachectic.  HENT:  Mouth/Throat: No oropharyngeal exudate.  Eyes: EOM are normal. Pupils are equal, round, and reactive to light.  Neck: Neck supple.  Cardiovascular: Normal rate and normal heart sounds.   Pulmonary/Chest: Effort normal and breath sounds normal.  Abdominal: Soft. Bowel sounds are normal. She exhibits no distension. There is no tenderness.  Lymphadenopathy:    She has no cervical adenopathy.          Assessment & Plan:

## 2014-06-06 NOTE — Assessment & Plan Note (Signed)
Will get her into dental clinic.

## 2014-06-06 NOTE — Addendum Note (Signed)
Addended by: Damain Broadus C on: 06/06/2014 02:49 PM   Modules accepted: Orders, Medications

## 2014-06-06 NOTE — Assessment & Plan Note (Addendum)
Would start her on genvoya but not yet available, will start stribild til this is covered. She is HLA+. Could consider complera but she needs PPI and her previously low CD4 is concerning.  Will stop bactrim.  Will see her back in 1 month

## 2014-06-06 NOTE — Assessment & Plan Note (Signed)
She has 2 cases of ensure, i encouraged her to take them. I let her know that we have resources to get her more if needed.

## 2014-06-06 NOTE — Assessment & Plan Note (Signed)
Will have her seen by GI. No narcotics are written today.

## 2014-06-09 LAB — AFB CULTURE WITH SMEAR (NOT AT ARMC): ACID FAST SMEAR: NONE SEEN

## 2014-06-11 LAB — AFB CULTURE WITH SMEAR (NOT AT ARMC): Acid Fast Smear: NONE SEEN

## 2014-06-12 LAB — AFB CULTURE WITH SMEAR (NOT AT ARMC): ACID FAST SMEAR: NONE SEEN

## 2014-06-15 ENCOUNTER — Ambulatory Visit: Payer: Self-pay

## 2014-06-22 ENCOUNTER — Other Ambulatory Visit: Payer: Self-pay | Admitting: Family Medicine

## 2014-06-22 ENCOUNTER — Ambulatory Visit: Payer: Medicaid Other | Admitting: Family Medicine

## 2014-06-22 DIAGNOSIS — K51911 Ulcerative colitis, unspecified with rectal bleeding: Secondary | ICD-10-CM

## 2014-06-22 MED ORDER — HYOSCYAMINE SULFATE 0.125 MG PO TABS: 0.1250 mg | ORAL_TABLET | Freq: Two times a day (BID) | ORAL | Status: DC | PRN

## 2014-06-22 MED ORDER — ACETAMINOPHEN-CODEINE #3 300-30 MG PO TABS: 1.0000 | ORAL_TABLET | Freq: Three times a day (TID) | ORAL | Status: DC | PRN

## 2014-06-22 NOTE — Telephone Encounter (Signed)
Patient late for f.u visit. Rescheduled visit. Requested refill of tylenol #3.  Tylenol #3 refilled, spaced from q 6 to q 8, 30 tablets.

## 2014-06-23 ENCOUNTER — Ambulatory Visit (INDEPENDENT_AMBULATORY_CARE_PROVIDER_SITE_OTHER): Payer: Medicaid Other | Admitting: Gastroenterology

## 2014-06-23 ENCOUNTER — Ambulatory Visit: Payer: Medicaid Other | Admitting: Family Medicine

## 2014-06-23 ENCOUNTER — Encounter: Payer: Self-pay | Admitting: Gastroenterology

## 2014-06-23 VITALS — BP 132/58 | HR 116 | Ht 69.0 in | Wt 108.0 lb

## 2014-06-23 DIAGNOSIS — K51919 Ulcerative colitis, unspecified with unspecified complications: Secondary | ICD-10-CM

## 2014-06-23 DIAGNOSIS — J984 Other disorders of lung: Secondary | ICD-10-CM

## 2014-06-23 DIAGNOSIS — J189 Pneumonia, unspecified organism: Secondary | ICD-10-CM

## 2014-06-23 DIAGNOSIS — Z21 Asymptomatic human immunodeficiency virus [HIV] infection status: Secondary | ICD-10-CM

## 2014-06-23 NOTE — Patient Instructions (Addendum)
We will need to obtain your Gastroenterology records for Dr. Deatra Ina to review. If you remember who the Gastroenterologist is please call Robin at [phone]. Or have the records faxed to our office at [phone] attention Robin/Dr. Deatra Ina.

## 2014-06-23 NOTE — Progress Notes (Signed)
_                                                                                                                History of Present Illness:  [name] [name] is a 54 year old Afro-American female referred for evaluation of ulcerative colitis.  Patient is a very poor historian and is very vague on her history.  She has a history of drug abuse, AIDS, hypertension and a cavitary lesion of the lung.  She was told that she has colitis while recently hospitalized in [facility] although she is unsure whether she had a colonoscopy.  A CT scan in November, 2015, demonstrated thickening of the left colon.  She complains of chronic abdominal pain.  Stools are intermittently loose and she has had some limited rectal bleeding.  On May 16, 2014 hemoglobin was 8.   Past Medical History  Diagnosis Date  . Ulcerative colitis   . HIV (human immunodeficiency virus infection)   . GI bleed   . GERD (gastroesophageal reflux disease) Dx 2014  . Hypertension Dx 2014  . Substance abuse     Quit 2008   Past Surgical History  Procedure Laterality Date  . Abdominal hysterectomy    . Video bronchoscopy Bilateral 04/27/2014    Procedure: VIDEO BRONCHOSCOPY WITH FLUORO;  Surgeon: Rigoberto Noel, MD;  Location: [facility] ENDOSCOPY;  Service: Cardiopulmonary;  Laterality: Bilateral;  . Tee without cardioversion N/A 05/02/2014    Procedure: TRANSESOPHAGEAL ECHOCARDIOGRAM (TEE);  Surgeon: Larey Dresser, MD;  Location: [facility] ENDOSCOPY;  Service: Cardiovascular;  Laterality: N/A;   family history includes Alcoholism in her father; Cancer in her father; Diabetes in her mother; Stroke in her mother. Current Outpatient Prescriptions  Medication Sig Dispense Refill  . acetaminophen-codeine (TYLENOL #3) 300-30 MG per tablet Take 1 tablet by mouth every 8 (eight) hours as needed for severe pain. 30 tablet 0  . amLODipine (NORVASC) 10 MG tablet Take 1 tablet (10 mg total) by mouth daily. 90 tablet 1  .  elvitegravir-cobicistat-emtricitabine-tenofovir (STRIBILD) 150-150-200-300 MG TABS tablet Take 1 tablet by mouth daily with breakfast. 30 tablet 6  . feeding supplement, ENSURE COMPLETE, (ENSURE COMPLETE) LIQD Take 237 mLs by mouth 3 (three) times daily between meals. 237 mL 0  . ferrous sulfate (FERROUSUL) 325 (65 FE) MG tablet Take 1 tablet (325 mg total) by mouth daily with breakfast. 90 tablet 1  . hydrALAZINE (APRESOLINE) 25 MG tablet Take 3 tablets (75 mg total) by mouth 3 (three) times daily. 270 tablet 1  . hydrochlorothiazide (HYDRODIURIL) 25 MG tablet Take 1 tablet (25 mg total) by mouth daily. 90 tablet 1  . hydrocortisone (ANUSOL-HC) 2.5 % rectal cream Place rectally 3 (three) times daily as needed for hemorrhoids or itching. 30 g 0  . hyoscyamine (LEVSIN, ANASPAZ) 0.125 MG tablet Take 1 tablet (0.125 mg total) by mouth 2 (two) times daily as needed for cramping (abd cramping). 180 tablet 1  . mirtazapine (REMERON SOL-TAB) 15 MG disintegrating tablet Take 1 tablet (15 mg total)  by mouth at bedtime. 90 tablet 0  . pantoprazole (PROTONIX) 40 MG tablet Take 1 tablet (40 mg total) by mouth 2 (two) times daily. 30 tablet 0  . predniSONE (DELTASONE) 10 MG tablet Take 4 tabs orally by mouth for 7 days, then take 3 tabs orally by mouth for 7 days, then take 2 tabs orally by mouth for 7 days, then take 1 tabs orally by mouth for 7 days, then stop. 70 tablet 0  . sulfaSALAzine (AZULFIDINE) 500 MG tablet Take 2 tablets (1,000 mg total) by mouth 2 (two) times daily. 180 tablet 1   No current facility-administered medications for this visit.   Allergies as of 06/23/2014  . (No Known Allergies)    reports that she has never smoked. She has never used smokeless tobacco. She reports that she does not drink alcohol or use illicit drugs.   Review of Systems: Pertinent positive and negative review of systems were noted in the above HPI section. All other review of systems were otherwise  negative.  Vital signs were reviewed in today's medical record Physical Exam: General: Chronically ill appearing, thin, in no acute distress Skin: anicteric Head: Normocephalic and atraumatic Eyes:  sclerae anicteric, EOMI Ears: Normal auditory acuity Mouth: No deformity or lesions Neck: Supple, no masses or thyromegaly Lungs: Clear throughout to auscultation Heart: Regular rate and rhythm; no murmurs, rubs or bruits Abdomen: Soft,  and non distended. No masses, hepatosplenomegaly or hernias noted. Normal Bowel sounds.  Mild diffuse tenderness Rectal:deferred Musculoskeletal: Symmetrical with no gross deformities  Skin: No lesions on visible extremities Pulses:  Normal pulses noted Extremities: No clubbing, cyanosis, edema or deformities noted Neurological: Alert oriented x 4, grossly nonfocal Cervical Nodes:  No significant cervical adenopathy Inguinal Nodes: No significant inguinal adenopathy Psychological:  Alert and cooperative. Normal mood and affect  See Assessment and Plan under Problem List

## 2014-06-23 NOTE — Assessment & Plan Note (Signed)
CT scan in November, 2015 demonstrated thickening of the left colon.  C. difficile toxin was negative.  She was apparently hospitalized in [facility] last month and was told she has ulcerative colitis but she can provide no details.  Patient claims have chronic abdominal pain that is not controlled with Tylenol No. 3.  She's asking for "stronger meds".  Patient has a history of drug abuse.  Before embarking on any workup we need to review her prior records.  She has been given pain medications through the clinic here in [place] and I told her that I will not prescribe any further pain medications.   Further recommendations pending review of her previous hospitalization.

## 2014-06-27 ENCOUNTER — Ambulatory Visit: Payer: Medicaid Other | Attending: Family Medicine | Admitting: Family Medicine

## 2014-06-27 ENCOUNTER — Encounter: Payer: Self-pay | Admitting: Family Medicine

## 2014-06-27 VITALS — BP 114/69 | HR 103 | Temp 99.1°F | Resp 16 | Ht 69.0 in | Wt 111.0 lb

## 2014-06-27 DIAGNOSIS — Z21 Asymptomatic human immunodeficiency virus [HIV] infection status: Secondary | ICD-10-CM | POA: Diagnosis not present

## 2014-06-27 DIAGNOSIS — G8929 Other chronic pain: Secondary | ICD-10-CM | POA: Diagnosis not present

## 2014-06-27 DIAGNOSIS — I10 Essential (primary) hypertension: Secondary | ICD-10-CM

## 2014-06-27 DIAGNOSIS — D638 Anemia in other chronic diseases classified elsewhere: Secondary | ICD-10-CM

## 2014-06-27 DIAGNOSIS — K5190 Ulcerative colitis, unspecified, without complications: Secondary | ICD-10-CM | POA: Diagnosis present

## 2014-06-27 DIAGNOSIS — R109 Unspecified abdominal pain: Secondary | ICD-10-CM | POA: Diagnosis not present

## 2014-06-27 DIAGNOSIS — R1084 Generalized abdominal pain: Secondary | ICD-10-CM

## 2014-06-27 DIAGNOSIS — G4700 Insomnia, unspecified: Secondary | ICD-10-CM

## 2014-06-27 DIAGNOSIS — K51911 Ulcerative colitis, unspecified with rectal bleeding: Secondary | ICD-10-CM

## 2014-06-27 MED ORDER — MIRTAZAPINE 15 MG PO TBDP: 15.0000 mg | ORAL_TABLET | Freq: Every day | ORAL | Status: DC

## 2014-06-27 MED ORDER — HYOSCYAMINE SULFATE 0.125 MG PO TABS: 0.1250 mg | ORAL_TABLET | Freq: Two times a day (BID) | ORAL | Status: DC | PRN

## 2014-06-27 MED ORDER — ACETAMINOPHEN-CODEINE #3 300-30 MG PO TABS: 1.0000 | ORAL_TABLET | Freq: Three times a day (TID) | ORAL | Status: DC | PRN

## 2014-06-27 MED ORDER — FERROUS SULFATE 325 (65 FE) MG PO TABS: 325.0000 mg | ORAL_TABLET | Freq: Every day | ORAL | Status: DC

## 2014-06-27 MED ORDER — AMLODIPINE BESYLATE 10 MG PO TABS: 10.0000 mg | ORAL_TABLET | Freq: Every day | ORAL | Status: DC

## 2014-06-27 MED ORDER — SULFASALAZINE 500 MG PO TABS: 1000.0000 mg | ORAL_TABLET | Freq: Two times a day (BID) | ORAL | Status: DC

## 2014-06-27 NOTE — Assessment & Plan Note (Signed)
Improving Refilled tylenol #3

## 2014-06-27 NOTE — Assessment & Plan Note (Signed)
A: well controlled Med: compliant P: continue current regimen

## 2014-06-27 NOTE — Progress Notes (Signed)
HFU Pt was diagnosed with Ulcerative colitis , and pneumonia. Pt c/o of abdomen pain. Pt states that she occasionally she has blood in her stool. Pt has a GI doctor.

## 2014-06-27 NOTE — Progress Notes (Signed)
   Subjective:    Patient ID: [name] [name], female    DOB: 1960/09/17, 54 y.o.   MRN: [id number] CC: f/u ulcerative colitis, pneumonia, chornic pain   HPI 54 yo F with HIV, UC, chronic abdominal pain, HTN  1. HTN: compliant with medication. Request norvasc refill. Denies HA, CP, SOB.   2. UC: improving. Compliant with regimen. Plugged in with GI. Refill tylenol #3 refill. Requiring 3 doses per day.   Soc Hx:  chronic non smoker   Review of Systems As per HPI     Objective:   Physical Exam BP 114/69 mmHg  Pulse 103  Temp(Src) 99.1 F (37.3 C) (Oral)  Resp 16  Ht 5' 9"  (1.753 m)  Wt 111 lb (50.349 kg)  BMI 16.38 kg/m2  SpO2 99%  Wt Readings from Last 3 Encounters:  06/27/14 111 lb (50.349 kg)  06/23/14 108 lb (48.988 kg)  06/06/14 112 lb (50.803 kg)  General appearance: alert, cooperative and no distress Lungs: clear to auscultation bilaterally Heart: regular rate and rhythm, S1, S2 normal, no murmur, click, rub or gallop      Assessment & Plan:

## 2014-06-27 NOTE — Patient Instructions (Signed)
Ms. Staff,  Thank you for coming back in to see me today. It looks like your current medication regimen is doing well. Call Dr. [name] for refill of stribild. I have refilled all other medications including tylenol #3   You should also schedule a screening mammogram.  F/u in 4 weeks for full physical with pelvic exam.  Dr. Adrian Blackwater

## 2014-07-05 ENCOUNTER — Other Ambulatory Visit: Payer: Self-pay | Admitting: *Deleted

## 2014-07-05 ENCOUNTER — Ambulatory Visit: Payer: Medicaid Other | Admitting: Infectious Diseases

## 2014-07-05 DIAGNOSIS — Z21 Asymptomatic human immunodeficiency virus [HIV] infection status: Secondary | ICD-10-CM

## 2014-07-05 MED ORDER — ELVITEG-COBIC-EMTRICIT-TENOFDF 150-150-200-300 MG PO TABS: 1.0000 | ORAL_TABLET | Freq: Every day | ORAL | Status: DC

## 2014-07-18 ENCOUNTER — Telehealth: Payer: Self-pay | Admitting: Family Medicine

## 2014-07-18 ENCOUNTER — Ambulatory Visit: Payer: Medicaid Other | Admitting: Infectious Diseases

## 2014-07-18 NOTE — Telephone Encounter (Signed)
Patient called to request a med refill for all of her current medications. Patient uses [facility] on [address] in [place]. Please f/u with pt.

## 2014-07-19 ENCOUNTER — Telehealth: Payer: Self-pay | Admitting: Family Medicine

## 2014-07-19 NOTE — Telephone Encounter (Signed)
Patient called to request medication refill . Please f/u with patient.

## 2014-07-19 NOTE — Telephone Encounter (Signed)
Pt has refills, advised to call her pharmacy

## 2014-07-27 ENCOUNTER — Other Ambulatory Visit: Payer: Self-pay | Admitting: Infectious Diseases

## 2014-07-27 DIAGNOSIS — B20 Human immunodeficiency virus [HIV] disease: Secondary | ICD-10-CM

## 2014-07-28 ENCOUNTER — Encounter: Payer: Medicaid Other | Admitting: Family Medicine

## 2014-07-29 ENCOUNTER — Other Ambulatory Visit: Payer: Self-pay | Admitting: General Practice

## 2014-07-29 ENCOUNTER — Telehealth: Payer: Self-pay | Admitting: *Deleted

## 2014-07-29 ENCOUNTER — Encounter: Payer: Medicaid Other | Admitting: Family Medicine

## 2014-07-29 DIAGNOSIS — K51911 Ulcerative colitis, unspecified with rectal bleeding: Secondary | ICD-10-CM

## 2014-07-29 MED ORDER — PANTOPRAZOLE SODIUM 40 MG PO TBEC: 40.0000 mg | DELAYED_RELEASE_TABLET | Freq: Two times a day (BID) | ORAL | Status: DC

## 2014-07-29 NOTE — Telephone Encounter (Signed)
Contacted patient to reschedule today's appt due to provider being out of clinic. Successfully rescheduled patients appointment. However, patient is needing medication refills for ALL of medications prescribed by Dr. Adrian Blackwater. Please assist.

## 2014-07-29 NOTE — Telephone Encounter (Signed)
Pt advised to call pharmacy for refills

## 2014-07-31 MED ORDER — PANTOPRAZOLE SODIUM 40 MG PO TBEC: 40.0000 mg | DELAYED_RELEASE_TABLET | Freq: Two times a day (BID) | ORAL | Status: DC

## 2014-07-31 MED ORDER — ACETAMINOPHEN-CODEINE #3 300-30 MG PO TABS: 1.0000 | ORAL_TABLET | Freq: Three times a day (TID) | ORAL | Status: DC | PRN

## 2014-07-31 NOTE — Addendum Note (Signed)
Addended by: Boykin Nearing on: 07/31/2014 01:19 PM   Modules accepted: Orders

## 2014-07-31 NOTE — Telephone Encounter (Signed)
Noted. Rx placed up front for pick up.  Please inform patient.

## 2014-08-01 ENCOUNTER — Ambulatory Visit: Payer: Medicaid Other | Admitting: Infectious Diseases

## 2014-08-03 NOTE — Telephone Encounter (Signed)
Left voice message to return call

## 2014-08-08 ENCOUNTER — Encounter: Payer: Medicaid Other | Admitting: Family Medicine

## 2014-08-10 ENCOUNTER — Telehealth: Payer: Self-pay | Admitting: Family Medicine

## 2014-08-10 NOTE — Telephone Encounter (Signed)
Pt hung up call

## 2014-08-10 NOTE — Telephone Encounter (Signed)
Pt calling to speak to nurse, please f/u with pt.

## 2014-08-15 ENCOUNTER — Encounter: Payer: Medicaid Other | Admitting: Family Medicine

## 2014-08-23 ENCOUNTER — Encounter: Payer: Self-pay | Admitting: Family Medicine

## 2014-08-23 ENCOUNTER — Other Ambulatory Visit (HOSPITAL_COMMUNITY)
Admission: RE | Admit: 2014-08-23 | Discharge: 2014-08-23 | Disposition: A | Discharge status: Home / Self Care | Payer: Medicaid Other | Source: Ambulatory Visit | Attending: Family Medicine | Admitting: Family Medicine

## 2014-08-23 ENCOUNTER — Ambulatory Visit: Payer: Medicaid Other | Attending: Family Medicine | Admitting: Family Medicine

## 2014-08-23 VITALS — BP 133/73 | HR 74 | Temp 98.4°F | Resp 18 | Ht 69.0 in | Wt 121.0 lb

## 2014-08-23 DIAGNOSIS — J189 Pneumonia, unspecified organism: Secondary | ICD-10-CM

## 2014-08-23 DIAGNOSIS — Z113 Encounter for screening for infections with a predominantly sexual mode of transmission: Secondary | ICD-10-CM | POA: Diagnosis not present

## 2014-08-23 DIAGNOSIS — N760 Acute vaginitis: Secondary | ICD-10-CM

## 2014-08-23 DIAGNOSIS — B9789 Other viral agents as the cause of diseases classified elsewhere: Secondary | ICD-10-CM

## 2014-08-23 DIAGNOSIS — K5180 Other ulcerative colitis without complications: Secondary | ICD-10-CM | POA: Diagnosis not present

## 2014-08-23 DIAGNOSIS — J984 Other disorders of lung: Secondary | ICD-10-CM | POA: Diagnosis not present

## 2014-08-23 DIAGNOSIS — K51919 Ulcerative colitis, unspecified with unspecified complications: Secondary | ICD-10-CM

## 2014-08-23 DIAGNOSIS — R87810 Cervical high risk human papillomavirus (HPV) DNA test positive: Secondary | ICD-10-CM | POA: Diagnosis not present

## 2014-08-23 DIAGNOSIS — Z681 Body mass index (BMI) 19 or less, adult: Secondary | ICD-10-CM | POA: Diagnosis not present

## 2014-08-23 DIAGNOSIS — R636 Underweight: Secondary | ICD-10-CM

## 2014-08-23 DIAGNOSIS — Z90710 Acquired absence of both cervix and uterus: Secondary | ICD-10-CM

## 2014-08-23 DIAGNOSIS — Z0000 Encounter for general adult medical examination without abnormal findings: Secondary | ICD-10-CM | POA: Diagnosis not present

## 2014-08-23 DIAGNOSIS — K51911 Ulcerative colitis, unspecified with rectal bleeding: Secondary | ICD-10-CM

## 2014-08-23 DIAGNOSIS — B20 Human immunodeficiency virus [HIV] disease: Secondary | ICD-10-CM | POA: Insufficient documentation

## 2014-08-23 DIAGNOSIS — B351 Tinea unguium: Secondary | ICD-10-CM

## 2014-08-23 DIAGNOSIS — Z124 Encounter for screening for malignant neoplasm of cervix: Secondary | ICD-10-CM

## 2014-08-23 DIAGNOSIS — A499 Bacterial infection, unspecified: Secondary | ICD-10-CM

## 2014-08-23 DIAGNOSIS — J069 Acute upper respiratory infection, unspecified: Secondary | ICD-10-CM

## 2014-08-23 DIAGNOSIS — D638 Anemia in other chronic diseases classified elsewhere: Secondary | ICD-10-CM

## 2014-08-23 DIAGNOSIS — Z1151 Encounter for screening for human papillomavirus (HPV): Secondary | ICD-10-CM | POA: Insufficient documentation

## 2014-08-23 DIAGNOSIS — I10 Essential (primary) hypertension: Secondary | ICD-10-CM

## 2014-08-23 DIAGNOSIS — E785 Hyperlipidemia, unspecified: Secondary | ICD-10-CM

## 2014-08-23 DIAGNOSIS — R87811 Vaginal high risk human papillomavirus (HPV) DNA test positive: Secondary | ICD-10-CM

## 2014-08-23 DIAGNOSIS — Z01411 Encounter for gynecological examination (general) (routine) with abnormal findings: Secondary | ICD-10-CM | POA: Insufficient documentation

## 2014-08-23 DIAGNOSIS — B9689 Other specified bacterial agents as the cause of diseases classified elsewhere: Secondary | ICD-10-CM

## 2014-08-23 DIAGNOSIS — E559 Vitamin D deficiency, unspecified: Secondary | ICD-10-CM

## 2014-08-23 DIAGNOSIS — K625 Hemorrhage of anus and rectum: Secondary | ICD-10-CM

## 2014-08-23 DIAGNOSIS — R87620 Atypical squamous cells of undetermined significance on cytologic smear of vagina (ASC-US): Secondary | ICD-10-CM

## 2014-08-23 LAB — LIPID PANEL
Cholesterol: 204 mg/dL — ABNORMAL HIGH (ref 0–200)
HDL: 43 mg/dL — ABNORMAL LOW (ref >=46)
LDL Cholesterol: 138 mg/dL — ABNORMAL HIGH (ref 0–99)
Total CHOL/HDL Ratio: 4.7 Ratio
Triglycerides: 116 mg/dL (ref <150)
VLDL: 23 mg/dL (ref 0–40)

## 2014-08-23 MED ORDER — FERROUS SULFATE 325 (65 FE) MG PO TABS: 325.0000 mg | ORAL_TABLET | Freq: Every day | ORAL | Status: DC

## 2014-08-23 MED ORDER — AMLODIPINE BESYLATE 10 MG PO TABS: 10.0000 mg | ORAL_TABLET | Freq: Every day | ORAL | Status: DC

## 2014-08-23 MED ORDER — HYDROCHLOROTHIAZIDE 25 MG PO TABS: 25.0000 mg | ORAL_TABLET | Freq: Every day | ORAL | Status: DC

## 2014-08-23 MED ORDER — PANTOPRAZOLE SODIUM 20 MG PO TBEC: 20.0000 mg | DELAYED_RELEASE_TABLET | Freq: Two times a day (BID) | ORAL | Status: DC

## 2014-08-23 MED ORDER — HYOSCYAMINE SULFATE 0.125 MG PO TABS: 0.1250 mg | ORAL_TABLET | Freq: Two times a day (BID) | ORAL | Status: DC | PRN

## 2014-08-23 MED ORDER — ACETAMINOPHEN-CODEINE #3 300-30 MG PO TABS: 1.0000 | ORAL_TABLET | Freq: Three times a day (TID) | ORAL | Status: DC | PRN

## 2014-08-23 MED ORDER — MIRTAZAPINE 15 MG PO TBDP: 15.0000 mg | ORAL_TABLET | Freq: Every day | ORAL | Status: DC

## 2014-08-23 NOTE — Assessment & Plan Note (Addendum)
A: improved. Stools have normalized. Patient has established with GI P: Refilled tylenol #3, taper down  Refilled protonix, decreased dose to 20 mg BID before meals  Refilled hyoscyamine

## 2014-08-23 NOTE — Assessment & Plan Note (Signed)
In the setting of UC flare, resolved

## 2014-08-23 NOTE — Assessment & Plan Note (Addendum)
You have a viral URI with cough. For this please do the following:  1. Drink plenty of fluids. Hot tea, soup etc will help open your nasal passages. 2. Dextromethorphan 30 mg every 6-8 hrs (plain Robitussin) for cough suppression 3. Tylenol for pain up to 1000 mg three times daily.  4. Nasal saline-OTC nose spray for congestion.   F/u for chest pain, shortness of breath, persistent high fever. Marland Kitchen

## 2014-08-23 NOTE — Assessment & Plan Note (Addendum)
Ordered mammogram. Ordered bone density scan

## 2014-08-23 NOTE — Assessment & Plan Note (Signed)
A: treated for cavitary PNA in 04/2014. P: F/u CXR

## 2014-08-23 NOTE — Assessment & Plan Note (Signed)
A; weight is on the rise along with CD4 count P:  Lipids, vit D level  Continue remeron

## 2014-08-23 NOTE — Assessment & Plan Note (Signed)
Referral to podiatry

## 2014-08-23 NOTE — Assessment & Plan Note (Signed)
Treated in 04/2014. No s.s of recurrent PNA will resolve problem

## 2014-08-23 NOTE — Patient Instructions (Addendum)
Ms. Washer,  Thank you for coming back in to see me today.   1. Pap done today. We will obtain records of your hysterectomy.  2. Ordered mammogram.  3. You have a viral URI with cough. For this please do the following:  1. Drink plenty of fluids. Hot tea, soup etc will help open your nasal passages. 2. Dextromethorphan 30 mg every 6-8 hrs (plain Robitussin) for cough suppression 3. Tylenol for pain up to 1000 mg three times daily.  4. Nasal saline-OTC nose spray for congestion.   F/u for chest pain, shortness of breath, persistent high fever.  Keep ID f/u it looks like Dr. [name] wanted to see you back in 1 month from last visit. Keep GI follow up as well.  F/u in 3 months, sooner if needed with me  Dr. Adrian Blackwater

## 2014-08-23 NOTE — Progress Notes (Signed)
Subjective:    Patient ID: [name] [name], female    DOB: Oct 07, 1960, 54 y.o.   MRN: [id number] CC: wellness exam with pap  HPI 54 yo F with HIV presents for physical with pap:  1. URI symptoms: x 2 days. Cough that is productive of clear or yellow sputum. Nasal congestion. Mild headache. No documented fever. No body aches, CP, SOB, chills. Patient compliant with stribild.  2. Ulcerative colitis: improved. Very mild pain symptoms. Stool is formed and brown. No watery stools, black stools or blood in stool. Gaining weight.   3. HM: due for mammogram, colonoscopy and pap smear. Has a hysterectomy in 2001 at [facility] she is unsure of the indication or pathology. She thinks perhaps there was mention of cancer.   Soc Hx: chronic non smoker  Review of Systems  General:  Negative for unexplained weight loss, fever Skin: Negative for new or changing mole, sore that won't heal HEENT: Negative for trouble hearing, trouble seeing, ringing in ears, mouth sores, hoarseness, change in voice, dysphagia. CV:  Negative for chest pain, dyspnea, edema, palpitations Resp: Negative for dyspnea, hemoptysis GI: Negative for nausea, vomiting, diarrhea, constipation,  melena, hematochezia. GU: Negative for dysuria, incontinence, urinary hesitance, hematuria, vaginal or penile discharge, polyuria, sexual difficulty, lumps in testicle or breasts MSK: Negative for muscle cramps or aches, joint pain or swelling Neuro: Negative for  weakness, numbness, dizziness, passing out/fainting Psych: Negative for depression, anxiety, memory problems    Objective:   Physical Exam  BP 133/73 mmHg  Pulse 74  Temp(Src) 98.4 F (36.9 C) (Oral)  Resp 18  Ht 5' 9"  (1.753 m)  Wt 121 lb (54.885 kg)  BMI 17.86 kg/m2  SpO2 100%  Wt Readings from Last 3 Encounters:  08/23/14 121 lb (54.885 kg)  06/27/14 111 lb (50.349 kg)  06/23/14 108 lb (48.988 kg)     General Appearance:    Alert, cooperative, no distress,  appears stated age, thin, African-American female.   Head:    Normocephalic, without obvious abnormality, atraumatic  Eyes:    PERRL, conjunctiva/corneas clear, EOM's intact, fundi    benign, both eyes  Ears:    Normal TM's and external ear canals, both ears  Nose:   Nares normal, septum midline, mucosa swollen but not inflammed, no drainage    or sinus tenderness  Throat:   Lips, mucosa, and tongue normal. Poor dentition with multiple missing teeth and carries.   Neck:   Supple, symmetrical, trachea midline, no adenopathy;    thyroid:  no enlargement/tenderness/nodules; no carotid   bruit or JVD  Back:     Symmetric, no curvature, ROM normal, no CVA tenderness  Lungs:     Clear to auscultation bilaterally, respirations unlabored  Chest Wall:    No tenderness or deformity   Heart:    Regular rate and rhythm, S1 and S2 normal, no murmur, rub   or gallop  Breast Exam:    No tenderness, masses, or nipple abnormality  Abdomen:     Soft, non-tender, bowel sounds active all four quadrants,    no masses, no organomegaly  Genitalia:    Normal female without lesion, discharge or tenderness, uterus and cervix surgically absent. Pap of vaginal cuff done.   Rectal:   Deferred   Extremities:   Extremities normal, atraumatic, no cyanosis. Trace edema in R foot. toenials long and thickened.   Pulses:   2+ and symmetric all extremities  Skin:   Skin color, texture, turgor normal,  no rashes or lesions  Lymph nodes:   Cervical, supraclavicular, and axillary nodes normal  Neurologic:   CNII-XII intact, normal strength, sensation and reflexes    throughout      Assessment & Plan:

## 2014-08-23 NOTE — Progress Notes (Signed)
Annual pap and physical No other problems today Hx tobacco user, No ETOH, No Drugs

## 2014-08-23 NOTE — Assessment & Plan Note (Signed)
Pap done today of vaginal cuff. We will obtain records of your hysterectomy.

## 2014-08-24 DIAGNOSIS — E785 Hyperlipidemia, unspecified: Secondary | ICD-10-CM | POA: Insufficient documentation

## 2014-08-24 DIAGNOSIS — B9689 Other specified bacterial agents as the cause of diseases classified elsewhere: Secondary | ICD-10-CM | POA: Insufficient documentation

## 2014-08-24 DIAGNOSIS — E559 Vitamin D deficiency, unspecified: Secondary | ICD-10-CM | POA: Insufficient documentation

## 2014-08-24 DIAGNOSIS — N760 Acute vaginitis: Secondary | ICD-10-CM

## 2014-08-24 LAB — CYTOLOGY - PAP

## 2014-08-24 LAB — CERVICOVAGINAL ANCILLARY ONLY
Chlamydia: NEGATIVE
Neisseria Gonorrhea: NEGATIVE
WET PREP (BD AFFIRM): POSITIVE — AB
Wet Prep (BD Affirm): NEGATIVE
Wet Prep (BD Affirm): NEGATIVE

## 2014-08-24 LAB — VITAMIN D 25 HYDROXY (VIT D DEFICIENCY, FRACTURES): Vit D, 25-Hydroxy: 22 ng/mL — ABNORMAL LOW (ref 30–100)

## 2014-08-24 MED ORDER — CALCIUM CITRATE-VITAMIN D 500-400 MG-UNIT PO CHEW: 1.0000 | CHEWABLE_TABLET | Freq: Two times a day (BID) | ORAL | Status: DC

## 2014-08-24 MED ORDER — METRONIDAZOLE 500 MG PO TABS: 500.0000 mg | ORAL_TABLET | Freq: Two times a day (BID) | ORAL | Status: DC

## 2014-08-24 MED ORDER — FLUCONAZOLE 150 MG PO TABS: 150.0000 mg | ORAL_TABLET | Freq: Once | ORAL | Status: DC

## 2014-08-24 NOTE — Addendum Note (Signed)
Addended by: Boykin Nearing on: 08/24/2014 01:13 PM   Modules accepted: Orders

## 2014-08-24 NOTE — Assessment & Plan Note (Signed)
A; vit D insuff P: treat with vit D and calcium

## 2014-08-24 NOTE — Assessment & Plan Note (Signed)
10 yr CVD risk is 7.2%, MI statin recommended  Statins may interact with HIV therapy, will hold off on treatment and recommend low fat diet.

## 2014-08-24 NOTE — Addendum Note (Signed)
Addended by: Boykin Nearing on: 08/24/2014 01:15 PM   Modules accepted: Orders

## 2014-08-25 ENCOUNTER — Other Ambulatory Visit: Payer: Self-pay | Admitting: *Deleted

## 2014-08-25 ENCOUNTER — Other Ambulatory Visit: Payer: Self-pay | Admitting: Family Medicine

## 2014-08-25 ENCOUNTER — Telehealth: Payer: Self-pay | Admitting: *Deleted

## 2014-08-25 DIAGNOSIS — Z113 Encounter for screening for infections with a predominantly sexual mode of transmission: Secondary | ICD-10-CM

## 2014-08-25 DIAGNOSIS — Z1231 Encounter for screening mammogram for malignant neoplasm of breast: Secondary | ICD-10-CM

## 2014-08-25 NOTE — Telephone Encounter (Signed)
-----   Message from Boykin Nearing, MD sent at 08/24/2014  1:13 PM EST ----- Up date: no lipitor for now. Low fat diet instead.

## 2014-08-25 NOTE — Telephone Encounter (Signed)
Pt aware of lab results Notified not to take Lipitor  Keep a low fat low carbohydrate diet and exercise

## 2014-08-25 NOTE — Telephone Encounter (Signed)
-----   Message from Boykin Nearing, MD sent at 08/24/2014  1:09 PM EST ----- Vit D def, take vit D and calcium Elevated cholesterol- lipitor 10 mg daily recommended  BV on wet prep. Neg GC/chlam

## 2014-08-29 DIAGNOSIS — R87620 Atypical squamous cells of undetermined significance on cytologic smear of vagina (ASC-US): Secondary | ICD-10-CM | POA: Insufficient documentation

## 2014-08-29 DIAGNOSIS — R87811 Vaginal high risk human papillomavirus (HPV) DNA test positive: Secondary | ICD-10-CM

## 2014-08-29 NOTE — Assessment & Plan Note (Signed)
Gyn referral for vaginal colposcopy Still awaiting medical records regarding the indication and pathology reports from patient's hysterectomy.

## 2014-08-29 NOTE — Addendum Note (Signed)
Addended by: Boykin Nearing on: 08/29/2014 08:48 AM   Modules accepted: Orders

## 2014-08-30 ENCOUNTER — Encounter: Payer: Self-pay | Admitting: Obstetrics & Gynecology

## 2014-09-01 ENCOUNTER — Telehealth: Payer: Self-pay | Admitting: *Deleted

## 2014-09-01 NOTE — Telephone Encounter (Signed)
Pt has GYN appointment

## 2014-09-01 NOTE — Telephone Encounter (Signed)
-----   Message from Boykin Nearing, MD sent at 08/29/2014  8:40 AM EDT ----- HPV positive, ASCUS pap.  Recommend f/u colposcopy-a look at cervix using a high powered microscope with the possible addition of biopsy taking of the cervix. Office procedure. No sedation. Set up very similar to a pap smear.  Referral to gyn for colposcopy

## 2014-09-30 ENCOUNTER — Encounter: Payer: Medicaid Other | Admitting: Obstetrics & Gynecology

## 2014-10-17 ENCOUNTER — Telehealth: Payer: Self-pay | Admitting: General Practice

## 2014-10-17 NOTE — Telephone Encounter (Signed)
Patient called and left message stating she is calling because she doesn't know where she is supposed to go for her appt. Called patient back and she asked what the appt was for and where. Told patient [place] referred her because she had an abnormal pap smear with HPV and we are going to do a procedure to follow up on that called a colposcopy. Also instructed patient as to where we are located. Patient verbalized understanding to all and had no questions

## 2014-10-19 ENCOUNTER — Encounter: Payer: Self-pay | Admitting: Obstetrics & Gynecology

## 2014-10-19 ENCOUNTER — Ambulatory Visit (INDEPENDENT_AMBULATORY_CARE_PROVIDER_SITE_OTHER): Payer: Self-pay | Admitting: Obstetrics & Gynecology

## 2014-10-19 VITALS — BP 123/70 | HR 72 | Temp 98.2°F | Resp 20 | Ht 69.0 in | Wt 115.6 lb

## 2014-10-19 DIAGNOSIS — R87811 Vaginal high risk human papillomavirus (HPV) DNA test positive: Secondary | ICD-10-CM

## 2014-10-19 DIAGNOSIS — R87620 Atypical squamous cells of undetermined significance on cytologic smear of vagina (ASC-US): Secondary | ICD-10-CM

## 2014-10-19 LAB — POCT PREGNANCY, URINE: PREG TEST UR: NEGATIVE

## 2014-10-19 NOTE — Progress Notes (Signed)
   Subjective:    Patient ID: [name] [name], female    DOB: [birth year], 54 y.o.   MRN: [id number]  HPI Pt presents for colpo. She had a pap smear done at [facility] which showed ASCUS with + high risk HPV. On further discussion she had a hysterectomy for benign indication many years ago and likely did not need to get the pap in the first place as she has no cervix.    Review of Systems No bleeding, discharge, pain.    Objective:   Physical Exam  Constitutional: She is oriented to person, place, and time. She appears well-developed and well-nourished. No distress.  HENT:  Head: Normocephalic and atraumatic.  Eyes: Conjunctivae are normal. Right eye exhibits no discharge. Left eye exhibits no discharge.  Cardiovascular: Normal rate.   Pulmonary/Chest: Effort normal.  Abdominal: Soft. She exhibits no distension and no mass. There is no tenderness.  Genitourinary: Vagina normal. No vaginal discharge found.  Uterus and cervix surgically absent. Normal vaginal cuff without obvious abnormalities  Neurological: She is alert and oriented to person, place, and time.  Skin: Skin is warm and dry. No rash noted. She is not diaphoretic.  Psychiatric: She has a normal mood and affect. Her behavior is normal.  Nursing note and vitals reviewed.         Assessment & Plan:  ASCUS w/+ HR HPV, normal exam today with absent cervix, hysterectomy >20 years ago for benign indication - no colpo or biopsy today - repeat pap in 1 year, if negative will not need any more paps

## 2014-10-19 NOTE — Assessment & Plan Note (Signed)
ASCUS w/+ HR HPV, normal exam today with absent cervix, hysterectomy >20 years ago for benign indication - no colpo or biopsy today - repeat pap in 1 year, if negative will not need any more paps

## 2014-10-19 NOTE — Patient Instructions (Signed)
Abnormal Pap Test Information During a Pap test, the cells on the surface of your cervix are checked to see if they look normal, abnormal, or if they show signs of having been altered by a certain type of virus called human papillomavirus, or HPV. Cervical cells that have been affected by HPV are called dysplasia. Dysplasia is not cancer, but describes abnormal cells found on the surface of the cervix. Depending on the degree of dysplasia, some of the cells may be considered pre-cancerous and may turn into cancer over time if follow up with a caregiver is delayed.  WHAT DOES AN ABNORMAL PAP TEST MEAN? Having an abnormal pap test does not mean that you have cancer. However, certain types of abnormal pap tests can be a sign that a person is at a higher risk of developing cancer. Your caregiver will want to do other tests to find out more about the abnormal cells. Your abnormal Pap test results could show:   Small and uncertain changes that should be carefully watched.   Cervical dysplasia that has caused mild changes and can be followed over time.  Cervical dysplasia that is more severe and needs to be followed and treated to ensure the problem goes away.  Cancer.  When severe cervical dysplasia is found and treated early, it rarely will grow into cancer.  WHAT WILL BE DONE ABOUT MY ABNORMAL PAP TEST?  A colposcopy may be needed. This is a procedure where your cervix is examined using light and magnification.  A small tissue sample of your cervix (biopsy) may need to be removed and then examined. This is often performed if there are areas that appear infected.  A sample of cells from the cervical canal may be removed with either a small brush or scraping instrument (curette). Based on the results of the procedures above, some caregivers may recommend either cryotherapy of the cervix or a surgical LEEP where a portion of the cervix is removed. LEEP is short for "loop electrical excisional  procedure." Rarely, a caregiver may recommend a cone biopsy.This is a procedure where a small, cone-shaped sample of your cervix is taken out. The part that is taken out is the area where the abnormal cells are.  WHAT IF I HAVE A DYSPLASIA OR A CANCER? You may be referred to a specialist. Radiation may also be a treatment for more advanced cancer. Having a hysterectomy is the last treatment option for dysplasia, but it is a more common treatment for someone with cancer. All treatment options will be discussed with you by your caregiver. WHAT SHOULD YOU DO AFTER BEING TREATED? If you have had an abnormal pap test, you should continue to have regular pap tests and check-ups as directed by your caregiver. Your cervical problem will be carefully watched so it does not get worse. Also, your caregiver can watch for, and treat, any new problems that may come up. Document Released: 09/18/2010 Document Revised: 09/28/2012 Document Reviewed: 05/30/2011 [employer] Patient Information 2015 [employer]. This information is not intended to replace advice given to you by your health care provider. Make sure you discuss any questions you have with your health care provider.  

## 2014-10-24 ENCOUNTER — Ambulatory Visit: Payer: Self-pay | Admitting: Family Medicine

## 2014-10-26 ENCOUNTER — Other Ambulatory Visit (HOSPITAL_COMMUNITY)
Admission: RE | Admit: 2014-10-26 | Discharge: 2014-10-26 | Disposition: A | Discharge status: Home / Self Care | Payer: Self-pay | Source: Ambulatory Visit | Attending: Infectious Disease | Admitting: Infectious Disease

## 2014-10-26 ENCOUNTER — Ambulatory Visit (INDEPENDENT_AMBULATORY_CARE_PROVIDER_SITE_OTHER): Payer: Self-pay | Admitting: Infectious Disease

## 2014-10-26 ENCOUNTER — Encounter: Payer: Self-pay | Admitting: Infectious Disease

## 2014-10-26 ENCOUNTER — Other Ambulatory Visit: Payer: Self-pay

## 2014-10-26 VITALS — BP 165/82 | HR 69 | Temp 98.1°F | Ht 69.0 in | Wt 115.0 lb

## 2014-10-26 DIAGNOSIS — K51918 Ulcerative colitis, unspecified with other complication: Secondary | ICD-10-CM

## 2014-10-26 DIAGNOSIS — K51911 Ulcerative colitis, unspecified with rectal bleeding: Secondary | ICD-10-CM

## 2014-10-26 DIAGNOSIS — Z113 Encounter for screening for infections with a predominantly sexual mode of transmission: Secondary | ICD-10-CM

## 2014-10-26 DIAGNOSIS — I10 Essential (primary) hypertension: Secondary | ICD-10-CM

## 2014-10-26 DIAGNOSIS — B20 Human immunodeficiency virus [HIV] disease: Secondary | ICD-10-CM

## 2014-10-26 DIAGNOSIS — Z21 Asymptomatic human immunodeficiency virus [HIV] infection status: Secondary | ICD-10-CM

## 2014-10-26 DIAGNOSIS — Z79899 Other long term (current) drug therapy: Secondary | ICD-10-CM

## 2014-10-26 LAB — COMPLETE METABOLIC PANEL WITH GFR
ALBUMIN: 3.5 g/dL (ref 3.5–5.2)
ALT: 178 U/L — AB (ref 0–35)
AST: 197 U/L — AB (ref 0–37)
Alkaline Phosphatase: 945 U/L — ABNORMAL HIGH (ref 39–117)
BUN: 11 mg/dL (ref 6–23)
CO2: 24 mEq/L (ref 19–32)
CREATININE: 0.68 mg/dL (ref 0.50–1.10)
Calcium: 9.3 mg/dL (ref 8.4–10.5)
Chloride: 106 mEq/L (ref 96–112)
GFR, Est African American: >89 mL/min
GFR, Est Non African American: >89 mL/min
Glucose, Bld: 72 mg/dL (ref 70–99)
POTASSIUM: 3.9 meq/L (ref 3.5–5.3)
SODIUM: 139 meq/L (ref 135–145)
Total Bilirubin: 1 mg/dL (ref 0.2–1.2)
Total Protein: 7.9 g/dL (ref 6.0–8.3)

## 2014-10-26 LAB — CBC WITH DIFFERENTIAL/PLATELET
Basophils Absolute: 0.1 10*3/uL (ref 0.0–0.1)
Basophils Relative: 1 % (ref 0–1)
Eosinophils Absolute: 0.1 10*3/uL (ref 0.0–0.7)
Eosinophils Relative: 2 % (ref 0–5)
HCT: 32.6 % — ABNORMAL LOW (ref 36.0–46.0)
Hemoglobin: 11 g/dL — ABNORMAL LOW (ref 12.0–15.0)
LYMPHS ABS: 4.3 10*3/uL — AB (ref 0.7–4.0)
Lymphocytes Relative: 72 % — ABNORMAL HIGH (ref 12–46)
MCH: 30.5 pg (ref 26.0–34.0)
MCHC: 33.7 g/dL (ref 30.0–36.0)
MCV: 90.3 fL (ref 78.0–100.0)
MONOS PCT: 9 % (ref 3–12)
MPV: 10 fL (ref 8.6–12.4)
Monocytes Absolute: 0.5 10*3/uL (ref 0.1–1.0)
Neutro Abs: 1 10*3/uL — ABNORMAL LOW (ref 1.7–7.7)
Neutrophils Relative %: 16 % — ABNORMAL LOW (ref 43–77)
Platelets: 343 10*3/uL (ref 150–400)
RBC: 3.61 MIL/uL — ABNORMAL LOW (ref 3.87–5.11)
RDW: 16.2 % — ABNORMAL HIGH (ref 11.5–15.5)
WBC: 6 10*3/uL (ref 4.0–10.5)

## 2014-10-26 LAB — LIPID PANEL
CHOL/HDL RATIO: 5.5 ratio
Cholesterol: 204 mg/dL — ABNORMAL HIGH (ref 0–200)
HDL: 37 mg/dL — ABNORMAL LOW (ref >=46)
LDL Cholesterol: 141 mg/dL — ABNORMAL HIGH (ref 0–99)
Triglycerides: 130 mg/dL (ref <150)
VLDL: 26 mg/dL (ref 0–40)

## 2014-10-26 MED ORDER — ELVITEG-COBIC-EMTRICIT-TENOFAF 150-150-200-10 MG PO TABS: 1.0000 | ORAL_TABLET | Freq: Every day | ORAL | Status: DC

## 2014-10-26 NOTE — Progress Notes (Signed)
   Subjective:    Patient ID: [name] [name], female    DOB: [birth year], 54 y.o.   MRN: [id number]  HPI  This is a 54 year old African-American lady with HIV, ulcerative colitis hypertension who had been seen by my partner Dr. [name] and started on STRIBILD. Apparently she lost her coverage in terms of Medicaid and could no longer get her antiretrovirals or any of her medications. She was here today for blood work and also to try to get plugged  into the AIDS drug assistance program.  She has met with [name] [name] who will be able to get her antiretrovirals via [employer] advancing access program.  We will change her now though to [facility] for better bone and kidney safety. I will [facility] other [facility] where she can get the antihypertensive medications at a discount. Terms of her meds for her ulcerative colitis that a more challenging but hopefully someone from patient assistance can help her.  Review of Systems  Constitutional: Negative for fever, chills, diaphoresis, activity change, appetite change, fatigue and unexpected weight change.  HENT: Negative for congestion, rhinorrhea, sinus pressure, sneezing, sore throat and trouble swallowing.   Eyes: Negative for photophobia and visual disturbance.  Respiratory: Negative for cough, chest tightness, shortness of breath, wheezing and stridor.   Cardiovascular: Negative for chest pain, palpitations and leg swelling.  Gastrointestinal: Negative for nausea, vomiting, abdominal pain, diarrhea, constipation, blood in stool, abdominal distention and anal bleeding.  Genitourinary: Negative for dysuria, hematuria, flank pain and difficulty urinating.  Musculoskeletal: Negative for myalgias, back pain, joint swelling, arthralgias and gait problem.  Skin: Negative for color change, pallor, rash and wound.  Neurological: Negative for dizziness, tremors, weakness and light-headedness.  Hematological: Negative for  adenopathy. Does not bruise/bleed easily.  Psychiatric/Behavioral: Negative for behavioral problems, confusion, sleep disturbance, dysphoric mood, decreased concentration and agitation.       Objective:   Physical Exam  Constitutional: She is oriented to person, place, and time. She appears well-developed and well-nourished. No distress.  HENT:  Head: Normocephalic and atraumatic.  Mouth/Throat: No oropharyngeal exudate.  Eyes: Conjunctivae and EOM are normal. No scleral icterus.  Neck: Normal range of motion. Neck supple.  Cardiovascular: Normal rate and regular rhythm.   Pulmonary/Chest: Effort normal. No respiratory distress. She has no wheezes.  Abdominal: She exhibits no distension.  Musculoskeletal: She exhibits no edema or tenderness.  Neurological: She is alert and oriented to person, place, and time. She exhibits normal muscle tone. Coordination normal.  Skin: Skin is warm and dry. No rash noted. She is not diaphoretic. No erythema. No pallor.  Psychiatric: She has a normal mood and affect. Her behavior is normal. Judgment and thought content normal.          Assessment & Plan:   HIV: start [place] check labs today and bring her back in one month's time get her plugged into [facility] and the AIDS drug assistance program. I spent greater than 25 minutes with the patient including greater than 50% of time in face to face counsel of the patient re HIV and her new aRV regimen  and in coordination of their care.   Also of colitis: We'll need help getting her medications  Hypertension poorly controlled because she does not have her meds hopefully at [facility] she can get meds through their pharmacy.

## 2014-10-27 ENCOUNTER — Encounter: Payer: Self-pay | Admitting: Family Medicine

## 2014-10-27 ENCOUNTER — Ambulatory Visit: Payer: Self-pay | Attending: Family Medicine | Admitting: Family Medicine

## 2014-10-27 VITALS — BP 163/77 | HR 73 | Temp 98.6°F | Resp 16 | Ht 69.0 in | Wt 116.0 lb

## 2014-10-27 DIAGNOSIS — Z720 Tobacco use: Secondary | ICD-10-CM

## 2014-10-27 DIAGNOSIS — R636 Underweight: Secondary | ICD-10-CM

## 2014-10-27 DIAGNOSIS — D638 Anemia in other chronic diseases classified elsewhere: Secondary | ICD-10-CM

## 2014-10-27 DIAGNOSIS — K51911 Ulcerative colitis, unspecified with rectal bleeding: Secondary | ICD-10-CM

## 2014-10-27 DIAGNOSIS — I10 Essential (primary) hypertension: Secondary | ICD-10-CM

## 2014-10-27 DIAGNOSIS — E559 Vitamin D deficiency, unspecified: Secondary | ICD-10-CM

## 2014-10-27 DIAGNOSIS — K51919 Ulcerative colitis, unspecified with unspecified complications: Secondary | ICD-10-CM

## 2014-10-27 DIAGNOSIS — F17200 Nicotine dependence, unspecified, uncomplicated: Secondary | ICD-10-CM

## 2014-10-27 DIAGNOSIS — B351 Tinea unguium: Secondary | ICD-10-CM

## 2014-10-27 LAB — IRON AND TIBC
%SAT: 22 % (ref 20–55)
Iron: 79 ug/dL (ref 42–145)
TIBC: 355 ug/dL (ref 250–470)
UIBC: 276 ug/dL (ref 125–400)

## 2014-10-27 LAB — T-HELPER CELL (CD4) - (RCID CLINIC ONLY)
CD4 T CELL HELPER: 8 % — AB (ref 33–55)
CD4 T Cell Abs: 360 /uL — ABNORMAL LOW (ref 400–2700)

## 2014-10-27 LAB — MICROALBUMIN / CREATININE URINE RATIO
Creatinine, Urine: 219.5 mg/dL
Microalb Creat Ratio: 6.4 mg/g (ref 0.0–30.0)
Microalb, Ur: 1.4 mg/dL (ref <2.0)

## 2014-10-27 LAB — FERRITIN: Ferritin: 114 ng/mL (ref 10–291)

## 2014-10-27 LAB — RPR

## 2014-10-27 LAB — URINE CYTOLOGY ANCILLARY ONLY
CHLAMYDIA, DNA PROBE: NEGATIVE
Neisseria Gonorrhea: NEGATIVE

## 2014-10-27 MED ORDER — SULFASALAZINE 500 MG PO TABS: 1000.0000 mg | ORAL_TABLET | Freq: Two times a day (BID) | ORAL | Status: DC

## 2014-10-27 MED ORDER — PANTOPRAZOLE SODIUM 20 MG PO TBEC: 20.0000 mg | DELAYED_RELEASE_TABLET | Freq: Two times a day (BID) | ORAL | Status: DC

## 2014-10-27 MED ORDER — MIRTAZAPINE 15 MG PO TABS: 15.0000 mg | ORAL_TABLET | Freq: Every day | ORAL | Status: DC

## 2014-10-27 MED ORDER — HYOSCYAMINE SULFATE 0.125 MG PO TABS: 0.1250 mg | ORAL_TABLET | Freq: Two times a day (BID) | ORAL | Status: DC | PRN

## 2014-10-27 MED ORDER — HYDROCHLOROTHIAZIDE 25 MG PO TABS: 25.0000 mg | ORAL_TABLET | Freq: Every day | ORAL | Status: DC

## 2014-10-27 MED ORDER — AMLODIPINE BESYLATE 10 MG PO TABS: 10.0000 mg | ORAL_TABLET | Freq: Every day | ORAL | Status: DC

## 2014-10-27 NOTE — Progress Notes (Signed)
Pt stated need medicine refills  Stated did not have BP medication  Hx Tobacco

## 2014-10-27 NOTE — Patient Instructions (Addendum)
[name] [name],  1. HTN: Goal < 140/90 Refilled norvasc 10 mg daily, HCTZ 25 mg daily   2. Ulcerative Colitis: Make sure to follow up with Dr. Deatra Ina  Refilled protonix, hyoscyamine and sulfasalazine  Refilled remeron which you take to improve appetite   3, anemia: improved. Checking iron stores to determine if you still need iron.  4. Vit D insuff: checking vit D level  5. Health care maintenance:  Please call to schedule mammogram, apply for [place] discount   6. Toenail fiungus: podiatry referral   7. Smoking: Smoking cessation support: smoking cessation hotline: 1-800-QUIT-NOW.  Smoking cessation classes are available through [facility]. Call [phone] or visit our website at [email or web address]   Please apply for [place] discount and orange card, you can also inquire if any of your medications are on the PASS (medications assistance) list.   F/u in 4 weeks with RN for BP check  F/u with me in 3 months   Dr. Adrian Blackwater

## 2014-10-27 NOTE — Progress Notes (Signed)
   Subjective:    Patient ID: [name] [name], female    DOB: 07-Oct-1960, 54 y.o.   MRN: [id number] CC: f.u HTN HPI 54 yo F with HIV, Ulcerative Colitis,   1. Healthcare maintenance: due for mammogram, colonoscopy, Tdap. Mammogram has been ordered. GI referral placed for ulcerative colitis. She has been seen once by GI. The plan was to review records before proceeding with work up.   2. HTN: patient out of medications. Has mild HA. Remainder of ROS as per below.   3. Ulcerative colitis: has stomach discomfort, mild anorexia, mild constipation. No blood per rectum.   Soc Hx:  Current smoker  Review of Systems  Constitutional: Positive for appetite change. Negative for fever and chills.  Respiratory: Negative for shortness of breath.   Cardiovascular: Negative for chest pain.  Gastrointestinal: Positive for abdominal pain and constipation. Negative for nausea, vomiting, diarrhea, blood in stool, abdominal distention, anal bleeding and rectal pain.  Neurological: Positive for headaches.       Objective:   Physical Exam BP 163/77 mmHg  Pulse 73  Temp(Src) 98.6 F (37 C) (Oral)  Resp 16  Ht 5' 9"  (1.753 m)  Wt 116 lb (52.617 kg)  BMI 17.12 kg/m2  SpO2 100%  Wt Readings from Last 3 Encounters:  10/27/14 116 lb (52.617 kg)  10/26/14 115 lb (52.164 kg)  10/19/14 115 lb 9.6 oz (52.436 kg)  General appearance: alert, cooperative and no distress, thin, AA woman  Lungs: clear to auscultation bilaterally Heart: regular rate and rhythm, S1, S2 normal, no murmur, click, rub or gallop Extremities: extremities normal, atraumatic, no cyanosis or edema  Onychomycosis of toenails.        Assessment & Plan:

## 2014-10-28 LAB — VITAMIN D 25 HYDROXY (VIT D DEFICIENCY, FRACTURES): Vit D, 25-Hydroxy: 14 ng/mL — ABNORMAL LOW (ref 30–100)

## 2014-10-29 LAB — HIV-1 RNA ULTRAQUANT REFLEX TO GENTYP+
HIV 1 RNA Quant: 53601 copies/mL — ABNORMAL HIGH (ref <20)
HIV-1 RNA QUANT, LOG: 4.73 {Log} — AB (ref <1.30)

## 2014-10-31 MED ORDER — VITAMIN D (ERGOCALCIFEROL) 1.25 MG (50000 UNIT) PO CAPS: 50000.0000 [IU] | ORAL_CAPSULE | ORAL | Status: DC

## 2014-10-31 NOTE — Addendum Note (Signed)
Addended by: Boykin Nearing on: 10/31/2014 09:35 AM   Modules accepted: Orders

## 2014-10-31 NOTE — Assessment & Plan Note (Signed)
Vit D went down, will need vit D 50 K U x 12 weeks followed by 2K U daily

## 2014-11-01 ENCOUNTER — Other Ambulatory Visit: Payer: Self-pay | Admitting: *Deleted

## 2014-11-01 MED ORDER — ELVITEG-COBIC-EMTRICIT-TENOFAF 150-150-200-10 MG PO TABS: 1.0000 | ORAL_TABLET | Freq: Every day | ORAL | Status: DC

## 2014-11-01 NOTE — Progress Notes (Signed)
ADAP application 

## 2014-11-03 LAB — HIV-1 INTEGRASE GENOTYPE

## 2014-11-08 LAB — HIV-1 GENOTYPR PLUS

## 2014-11-11 ENCOUNTER — Telehealth: Payer: Self-pay | Admitting: *Deleted

## 2014-11-11 DIAGNOSIS — K51911 Ulcerative colitis, unspecified with rectal bleeding: Secondary | ICD-10-CM

## 2014-11-11 NOTE — Telephone Encounter (Signed)
-----   Message from Boykin Nearing, MD sent at 10/31/2014  9:32 AM EDT ----- Normal iron levels Vit D went down, will need vit D 50 K U x 12 weeks followed by 2K U daily

## 2014-11-11 NOTE — Telephone Encounter (Signed)
Left voice message to return call

## 2014-11-16 NOTE — Telephone Encounter (Signed)
Pt aware of results 

## 2014-11-18 MED ORDER — SULFASALAZINE 500 MG PO TABS: 1000.0000 mg | ORAL_TABLET | Freq: Two times a day (BID) | ORAL | Status: DC

## 2014-11-18 NOTE — Telephone Encounter (Signed)
Refilled azulfidine for 120

## 2014-11-23 ENCOUNTER — Ambulatory Visit: Payer: Self-pay

## 2014-11-23 ENCOUNTER — Ambulatory Visit: Payer: Self-pay | Attending: Family Medicine | Admitting: *Deleted

## 2014-11-23 VITALS — BP 117/77 | HR 98 | Temp 98.1°F | Resp 16 | Ht 69.0 in | Wt 111.4 lb

## 2014-11-23 DIAGNOSIS — I10 Essential (primary) hypertension: Secondary | ICD-10-CM

## 2014-11-23 MED ORDER — AMLODIPINE BESYLATE 10 MG PO TABS: 5.0000 mg | ORAL_TABLET | Freq: Every day | ORAL | Status: DC

## 2014-11-23 NOTE — Patient Instructions (Addendum)
DASH Eating Plan DASH stands for "Dietary Approaches to Stop Hypertension." The DASH eating plan is a healthy eating plan that has been shown to reduce high blood pressure (hypertension). Additional health benefits may include reducing the risk of type 2 diabetes mellitus, heart disease, and stroke. The DASH eating plan may also help with weight loss. WHAT DO I NEED TO KNOW ABOUT THE DASH EATING PLAN? For the DASH eating plan, you will follow these general guidelines:  Choose foods with a percent daily value for sodium of less than 5% (as listed on the food label).  Use salt-free seasonings or herbs instead of table salt or sea salt.  Check with your health care provider or pharmacist before using salt substitutes.  Eat lower-sodium products, often labeled as "lower sodium" or "no salt added."  Eat fresh foods.  Eat more vegetables, fruits, and low-fat dairy products.  Choose whole grains. Look for the word "whole" as the first word in the ingredient list.  Choose fish and skinless chicken or [place] more often than red meat. Limit fish, poultry, and meat to 6 oz (170 g) each day.  Limit sweets, desserts, sugars, and sugary drinks.  Choose heart-healthy fats.  Limit cheese to 1 oz (28 g) per day.  Eat more home-cooked food and less restaurant, buffet, and fast food.  Limit fried foods.  Cook foods using methods other than frying.  Limit canned vegetables. If you do use them, rinse them well to decrease the sodium.  When eating at a restaurant, ask that your food be prepared with less salt, or no salt if possible. WHAT FOODS CAN I EAT? Seek help from a dietitian for individual calorie needs. Grains Whole grain or whole wheat bread. Brown rice. Whole grain or whole wheat pasta. Quinoa, bulgur, and whole grain cereals. Low-sodium cereals. Corn or whole wheat flour tortillas. Whole grain cornbread. Whole grain crackers. Low-sodium crackers. Vegetables Fresh or frozen vegetables  (raw, steamed, roasted, or grilled). Low-sodium or reduced-sodium tomato and vegetable juices. Low-sodium or reduced-sodium tomato sauce and paste. Low-sodium or reduced-sodium canned vegetables.  Fruits All fresh, canned (in natural juice), or frozen fruits. Meat and Other Protein Products Ground beef (85% or leaner), grass-fed beef, or beef trimmed of fat. Skinless chicken or [place]. Ground chicken or [place]. Pork trimmed of fat. All fish and seafood. Eggs. Dried beans, peas, or lentils. Unsalted nuts and seeds. Unsalted canned beans. Dairy Low-fat dairy products, such as skim or 1% milk, 2% or reduced-fat cheeses, low-fat ricotta or cottage cheese, or plain low-fat yogurt. Low-sodium or reduced-sodium cheeses. Fats and Oils Tub margarines without trans fats. Light or reduced-fat mayonnaise and salad dressings (reduced sodium). Avocado. Safflower, olive, or canola oils. Natural peanut or almond butter. Other Unsalted popcorn and pretzels. The items listed above may not be a complete list of recommended foods or beverages. Contact your dietitian for more options. WHAT FOODS ARE NOT RECOMMENDED? Grains White bread. White pasta. White rice. Refined cornbread. Bagels and croissants. Crackers that contain trans fat. Vegetables Creamed or fried vegetables. Vegetables in a cheese sauce. Regular canned vegetables. Regular canned tomato sauce and paste. Regular tomato and vegetable juices. Fruits Dried fruits. Canned fruit in light or heavy syrup. Fruit juice. Meat and Other Protein Products Fatty cuts of meat. Ribs, chicken wings, bacon, sausage, bologna, salami, chitterlings, fatback, hot dogs, bratwurst, and packaged luncheon meats. Salted nuts and seeds. Canned beans with salt. Dairy Whole or 2% milk, cream, half-and-half, and cream cheese. Whole-fat or sweetened yogurt. Full-fat  cheeses or blue cheese. Nondairy creamers and whipped toppings. Processed cheese, cheese spreads, or cheese  curds. Condiments Onion and garlic salt, seasoned salt, table salt, and sea salt. Canned and packaged gravies. Worcestershire sauce. Tartar sauce. Barbecue sauce. Teriyaki sauce. Soy sauce, including reduced sodium. Steak sauce. Fish sauce. Oyster sauce. Cocktail sauce. Horseradish. Ketchup and mustard. Meat flavorings and tenderizers. Bouillon cubes. Hot sauce. Tabasco sauce. Marinades. Taco seasonings. Relishes. Fats and Oils Butter, stick margarine, lard, shortening, ghee, and bacon fat. Coconut, palm kernel, or palm oils. Regular salad dressings. Other Pickles and olives. Salted popcorn and pretzels. The items listed above may not be a complete list of foods and beverages to avoid. Contact your dietitian for more information. WHERE CAN I FIND MORE INFORMATION? National Heart, Lung, and Blood Institute: travelstabloid.com Document Released: 05/23/2011 Document Revised: 10/18/2013 Document Reviewed: 04/07/2013 [facility] Patient Information 2015 [place], [place]. This information is not intended to replace advice given to you by your health care provider. Make sure you discuss any questions you have with your health care provider. Smoking Cessation, Tips for Success If you are ready to quit smoking, congratulations! You have chosen to help yourself be healthier. Cigarettes bring nicotine, tar, carbon monoxide, and other irritants into your body. Your lungs, heart, and blood vessels will be able to work better without these poisons. There are many different ways to quit smoking. Nicotine gum, nicotine patches, a nicotine inhaler, or nicotine nasal spray can help with physical craving. Hypnosis, support groups, and medicines help break the habit of smoking. WHAT THINGS CAN I DO TO MAKE QUITTING EASIER?  Here are some tips to help you quit for good:  Pick a date when you will quit smoking completely. Tell all of your friends and family about your plan to quit on that  date.  Do not try to slowly cut down on the number of cigarettes you are smoking. Pick a quit date and quit smoking completely starting on that day.  Throw away all cigarettes.   Clean and remove all ashtrays from your home, work, and car.  On a card, write down your reasons for quitting. Carry the card with you and read it when you get the urge to smoke.  Cleanse your body of nicotine. Drink enough water and fluids to keep your urine clear or pale yellow. Do this after quitting to flush the nicotine from your body.  Learn to predict your moods. Do not let a bad situation be your excuse to have a cigarette. Some situations in your life might tempt you into wanting a cigarette.  Never have "just one" cigarette. It leads to wanting another and another. Remind yourself of your decision to quit.  Change habits associated with smoking. If you smoked while driving or when feeling stressed, try other activities to replace smoking. Stand up when drinking your coffee. Brush your teeth after eating. Sit in a different chair when you read the paper. Avoid alcohol while trying to quit, and try to drink fewer caffeinated beverages. Alcohol and caffeine may urge you to smoke.  Avoid foods and drinks that can trigger a desire to smoke, such as sugary or spicy foods and alcohol.  Ask people who smoke not to smoke around you.  Have something planned to do right after eating or having a cup of coffee. For example, plan to take a walk or exercise.  Try a relaxation exercise to calm you down and decrease your stress. Remember, you may be tense and nervous for the  first 2 weeks after you quit, but this will pass.  Find new activities to keep your hands busy. Play with a pen, coin, or rubber band. Doodle or draw things on paper.  Brush your teeth right after eating. This will help cut down on the craving for the taste of tobacco after meals. You can also try mouthwash.   Use oral substitutes in place of  cigarettes. Try using lemon drops, carrots, cinnamon sticks, or chewing gum. Keep them handy so they are available when you have the urge to smoke.  When you have the urge to smoke, try deep breathing.  Designate your home as a nonsmoking area.  If you are a heavy smoker, ask your health care provider about a prescription for nicotine chewing gum. It can ease your withdrawal from nicotine.  Reward yourself. Set aside the cigarette money you save and buy yourself something nice.  Look for support from others. Join a support group or smoking cessation program. Ask someone at home or at work to help you with your plan to quit smoking.  Always ask yourself, "Do I need this cigarette or is this just a reflex?" Tell yourself, "Today, I choose not to smoke," or "I do not want to smoke." You are reminding yourself of your decision to quit.  Do not replace cigarette smoking with electronic cigarettes (commonly called e-cigarettes). The safety of e-cigarettes is unknown, and some may contain harmful chemicals.  If you relapse, do not give up! Plan ahead and think about what you will do the next time you get the urge to smoke. HOW WILL I FEEL WHEN I QUIT SMOKING? You may have symptoms of withdrawal because your body is used to nicotine (the addictive substance in cigarettes). You may crave cigarettes, be irritable, feel very hungry, cough often, get headaches, or have difficulty concentrating. The withdrawal symptoms are only temporary. They are strongest when you first quit but will go away within 10-14 days. When withdrawal symptoms occur, stay in control. Think about your reasons for quitting. Remind yourself that these are signs that your body is healing and getting used to being without cigarettes. Remember that withdrawal symptoms are easier to treat than the major diseases that smoking can cause.  Even after the withdrawal is over, expect periodic urges to smoke. However, these cravings are generally  short lived and will go away whether you smoke or not. Do not smoke! WHAT RESOURCES ARE AVAILABLE TO HELP ME QUIT SMOKING? Your health care provider can direct you to community resources or hospitals for support, which may include:  Group support.  Education.  Hypnosis.  Therapy. Document Released: 03/01/2004 Document Revised: 10/18/2013 Document Reviewed: 11/19/2012 [facility] Patient Information 2015 [place], [place]. This information is not intended to replace advice given to you by your health care provider. Make sure you discuss any questions you have with your health care provider. Smoking Cessation Quitting smoking is important to your health and has many advantages. However, it is not always easy to quit since nicotine is a very addictive drug. Oftentimes, people try 3 times or more before being able to quit. This document explains the best ways for you to prepare to quit smoking. Quitting takes hard work and a lot of effort, but you can do it. ADVANTAGES OF QUITTING SMOKING  You will live longer, feel better, and live better.  Your body will feel the impact of quitting smoking almost immediately.  Within 20 minutes, blood pressure decreases. Your pulse returns to its normal  level.  After 8 hours, carbon monoxide levels in the blood return to normal. Your oxygen level increases.  After 24 hours, the chance of having a heart attack starts to decrease. Your breath, hair, and body stop smelling like smoke.  After 48 hours, damaged nerve endings begin to recover. Your sense of taste and smell improve.  After 72 hours, the body is virtually free of nicotine. Your bronchial tubes relax and breathing becomes easier.  After 2 to 12 weeks, lungs can hold more air. Exercise becomes easier and circulation improves.  The risk of having a heart attack, stroke, cancer, or lung disease is greatly reduced.  After 1 year, the risk of coronary heart disease is cut in half.  After 5 years, the  risk of stroke falls to the same as a nonsmoker.  After 10 years, the risk of lung cancer is cut in half and the risk of other cancers decreases significantly.  After 15 years, the risk of coronary heart disease drops, usually to the level of a nonsmoker.  If you are pregnant, quitting smoking will improve your chances of having a healthy baby.  The people you live with, especially any children, will be healthier.  You will have extra money to spend on things other than cigarettes. QUESTIONS TO THINK ABOUT BEFORE ATTEMPTING TO QUIT You may want to talk about your answers with your health care provider.  Why do you want to quit?  If you tried to quit in the past, what helped and what did not?  What will be the most difficult situations for you after you quit? How will you plan to handle them?  Who can help you through the tough times? Your family? Friends? A health care provider?  What pleasures do you get from smoking? What ways can you still get pleasure if you quit? Here are some questions to ask your health care provider:  How can you help me to be successful at quitting?  What medicine do you think would be best for me and how should I take it?  What should I do if I need more help?  What is smoking withdrawal like? How can I get information on withdrawal? GET READY  Set a quit date.  Change your environment by getting rid of all cigarettes, ashtrays, matches, and lighters in your home, car, or work. Do not let people smoke in your home.  Review your past attempts to quit. Think about what worked and what did not. GET SUPPORT AND ENCOURAGEMENT You have a better chance of being successful if you have help. You can get support in many ways.  Tell your family, friends, and coworkers that you are going to quit and need their support. Ask them not to smoke around you.  Get individual, group, or telephone counseling and support. Programs are available at [employer] and  health centers. Call your local health department for information about programs in your area.  Spiritual beliefs and practices may help some smokers quit.  Download a "quit meter" on your computer to keep track of quit statistics, such as how long you have gone without smoking, cigarettes not smoked, and money saved.  Get a self-help book about quitting smoking and staying off tobacco. [place] yourself from urges to smoke. Talk to someone, go for a walk, or occupy your time with a task.  Change your normal routine. Take a different route to work. Drink tea instead of coffee. Eat  breakfast in a different place.  Reduce your stress. Take a hot bath, exercise, or read a book.  Plan something enjoyable to do every day. Reward yourself for not smoking.  Explore interactive web-based programs that specialize in helping you quit. GET MEDICINE AND USE IT CORRECTLY Medicines can help you stop smoking and decrease the urge to smoke. Combining medicine with the above behavioral methods and support can greatly increase your chances of successfully quitting smoking.  Nicotine replacement therapy helps deliver nicotine to your body without the negative effects and risks of smoking. Nicotine replacement therapy includes nicotine gum, lozenges, inhalers, nasal sprays, and skin patches. Some may be available over-the-counter and others require a prescription.  Antidepressant medicine helps people abstain from smoking, but how this works is unknown. This medicine is available by prescription.  Nicotinic receptor partial agonist medicine simulates the effect of nicotine in your brain. This medicine is available by prescription. Ask your health care provider for advice about which medicines to use and how to use them based on your health history. Your health care provider will tell you what side effects to look out for if you choose to be on a medicine or therapy. Carefully  read the information on the package. Do not use any other product containing nicotine while using a nicotine replacement product.  RELAPSE OR DIFFICULT SITUATIONS Most relapses occur within the first 3 months after quitting. Do not be discouraged if you start smoking again. Remember, most people try several times before finally quitting. You may have symptoms of withdrawal because your body is used to nicotine. You may crave cigarettes, be irritable, feel very hungry, cough often, get headaches, or have difficulty concentrating. The withdrawal symptoms are only temporary. They are strongest when you first quit, but they will go away within 10-14 days. To reduce the chances of relapse, try to:  Avoid drinking alcohol. Drinking lowers your chances of successfully quitting.  Reduce the amount of caffeine you consume. Once you quit smoking, the amount of caffeine in your body increases and can give you symptoms, such as a rapid heartbeat, sweating, and anxiety.  Avoid smokers because they can make you want to smoke.  Do not let weight gain distract you. Many smokers will gain weight when they quit, usually less than 10 pounds. Eat a healthy diet and stay active. You can always lose the weight gained after you quit.  Find ways to improve your mood other than smoking. FOR MORE INFORMATION  www.smokefree.gov  Document Released: 05/28/2001 Document Revised: 10/18/2013 Document Reviewed: 09/12/2011 [facility] Patient Information 2015 [place], [place]. This information is not intended to replace advice given to you by your health care provider. Make sure you discuss any questions you have with your health care provider.

## 2014-11-23 NOTE — Progress Notes (Signed)
Patient presents with friend for BP check after increasing amplodipine to 10 mg daily Med list reviewed; states taking all meds as directed, however, has not taken daily dose of amplodipine or HCTZ due to rushing Discussed need for low sodium diet and using Mrs. Dash as alternative to salt Encouraged to choose foods with 5% or less of daily value for sodium. Walking 60 minutes per day for exercise plus active with grandkids in backyard and gardening Patient denies headaches, SHOB, chest pain or pressure Positive for occasional blurred vision States 3 episodes in last 3 weeks of standing quickly from lying position and feeling like passing out Patient instructed to rise slowly from lying to sitting for several seconds, then rise slowly from sitting to standing.  Orthostatic BPs done 2 minutes apart  Lying 113/74  Sitting 123/80 P 94 O2 Sat 100% Standing 117/77 P 98 O2 Sat 100%  Per PCP: Decrease amlodipine to 5 mg daily  Patient to return in 2 weeks for nurse visit for BP re-check  Patient given literature on DASH Eating Plan, Smoking Cessation and Smoking Cessation Tips for Success

## 2014-12-07 NOTE — Progress Notes (Signed)
Notified [facility]. Confirmed regimen, prescriptions on file for pick up. Landis Gandy, RN

## 2014-12-20 ENCOUNTER — Ambulatory Visit: Payer: Self-pay | Admitting: Infectious Disease

## 2015-01-02 ENCOUNTER — Ambulatory Visit: Payer: Self-pay | Admitting: Infectious Disease

## 2015-01-13 ENCOUNTER — Ambulatory Visit: Payer: Self-pay | Attending: Family Medicine

## 2015-01-18 ENCOUNTER — Ambulatory Visit: Payer: Self-pay

## 2015-01-20 ENCOUNTER — Ambulatory Visit: Payer: Self-pay | Attending: Family Medicine | Admitting: Pharmacist

## 2015-01-20 VITALS — BP 120/76 | HR 75

## 2015-01-20 DIAGNOSIS — I10 Essential (primary) hypertension: Secondary | ICD-10-CM | POA: Insufficient documentation

## 2015-01-20 DIAGNOSIS — Z720 Tobacco use: Secondary | ICD-10-CM | POA: Insufficient documentation

## 2015-01-20 NOTE — Progress Notes (Signed)
Patient seen with pharmacy resident. I agree with his documentation as written.   Nicoletta Ba, PharmD, BCPS Clinical Pharmacist

## 2015-01-20 NOTE — Progress Notes (Signed)
S:    Patient arrives in good spirits with her grand-daughter. She presents to the clinic for hypertension evaluation.   Patient reports adherence with medications.  Current BP Medications include:  Amlodipine 5 mg daily, HCTZ 25 mg daily   O:   Last 3 Office BP readings: BP Readings from Last 3 Encounters:  01/20/15 120/76  11/23/14 117/77  10/27/14 163/77    BMET    Component Value Date/Time   NA 139 10/26/2014 1151   K 3.9 10/26/2014 1151   CL 106 10/26/2014 1151   CO2 24 10/26/2014 1151   GLUCOSE 72 10/26/2014 1151   BUN 11 10/26/2014 1151   CREATININE 0.68 10/26/2014 1151   CREATININE 0.66 05/15/2014 0440   CALCIUM 9.3 10/26/2014 1151   GFRNONAA >89 10/26/2014 1151   GFRNONAA >90 05/15/2014 0440   GFRAA >89 10/26/2014 1151   GFRAA >90 05/15/2014 0440    A/P:  History of hypertension currently controlled on current medications with a BP of 120/76 today.  Will continue current medications. Congratulated pt on BP management and adherence to medications.     Results reviewed and written information provided.   Total time in face-to-face counseling 20 minutes.  Patient seen with Bennye Alm, PharmD Pharmacy Resident

## 2015-01-20 NOTE — Patient Instructions (Signed)
Thank you for coming in today Please continue to take your medications on time

## 2015-01-31 ENCOUNTER — Ambulatory Visit: Payer: Self-pay | Admitting: Family Medicine

## 2015-02-02 ENCOUNTER — Encounter: Payer: Self-pay | Admitting: Infectious Disease

## 2015-02-02 ENCOUNTER — Ambulatory Visit (INDEPENDENT_AMBULATORY_CARE_PROVIDER_SITE_OTHER): Payer: Self-pay | Admitting: Infectious Disease

## 2015-02-02 VITALS — BP 126/75 | HR 70 | Temp 98.1°F | Ht 68.0 in | Wt 117.5 lb

## 2015-02-02 DIAGNOSIS — I10 Essential (primary) hypertension: Secondary | ICD-10-CM

## 2015-02-02 DIAGNOSIS — B20 Human immunodeficiency virus [HIV] disease: Secondary | ICD-10-CM

## 2015-02-02 DIAGNOSIS — IMO0001 Reserved for inherently not codable concepts without codable children: Secondary | ICD-10-CM

## 2015-02-02 DIAGNOSIS — Z113 Encounter for screening for infections with a predominantly sexual mode of transmission: Secondary | ICD-10-CM

## 2015-02-02 DIAGNOSIS — Z720 Tobacco use: Secondary | ICD-10-CM

## 2015-02-02 DIAGNOSIS — K029 Dental caries, unspecified: Secondary | ICD-10-CM

## 2015-02-02 DIAGNOSIS — F17200 Nicotine dependence, unspecified, uncomplicated: Secondary | ICD-10-CM

## 2015-02-02 HISTORY — DX: Reserved for inherently not codable concepts without codable children: IMO0001

## 2015-02-02 LAB — CBC WITH DIFFERENTIAL/PLATELET
Basophils Absolute: 0 K/uL (ref 0.0–0.1)
Basophils Relative: 0 % (ref 0–1)
Eosinophils Absolute: 0.3 K/uL (ref 0.0–0.7)
Eosinophils Relative: 4 % (ref 0–5)
HCT: 33.5 % — ABNORMAL LOW (ref 36.0–46.0)
Hemoglobin: 11.4 g/dL — ABNORMAL LOW (ref 12.0–15.0)
Lymphocytes Relative: 41 % (ref 12–46)
Lymphs Abs: 2.8 K/uL (ref 0.7–4.0)
MCH: 32 pg (ref 26.0–34.0)
MCHC: 34 g/dL (ref 30.0–36.0)
MCV: 94.1 fL (ref 78.0–100.0)
MPV: 9.6 fL (ref 8.6–12.4)
Monocytes Absolute: 0.5 K/uL (ref 0.1–1.0)
Monocytes Relative: 7 % (ref 3–12)
Neutro Abs: 3.3 K/uL (ref 1.7–7.7)
Neutrophils Relative %: 48 % (ref 43–77)
Platelets: 419 K/uL — ABNORMAL HIGH (ref 150–400)
RBC: 3.56 MIL/uL — ABNORMAL LOW (ref 3.87–5.11)
RDW: 15.2 % (ref 11.5–15.5)
WBC: 6.8 K/uL (ref 4.0–10.5)

## 2015-02-02 LAB — COMPLETE METABOLIC PANEL WITHOUT GFR
ALT: 66 U/L — ABNORMAL HIGH (ref 6–29)
AST: 104 U/L — ABNORMAL HIGH (ref 10–35)
Albumin: 3.8 g/dL (ref 3.6–5.1)
Alkaline Phosphatase: 933 U/L — ABNORMAL HIGH (ref 33–130)
BUN: 19 mg/dL (ref 7–25)
CO2: 24 mmol/L (ref 20–31)
Calcium: 9.3 mg/dL (ref 8.6–10.4)
Chloride: 103 mmol/L (ref 98–110)
Creat: 0.72 mg/dL (ref 0.50–1.05)
GFR, Est African American: >89 mL/min
GFR, Est Non African American: >89 mL/min
Glucose, Bld: 98 mg/dL (ref 65–99)
Potassium: 4.5 mmol/L (ref 3.5–5.3)
Sodium: 136 mmol/L (ref 135–146)
Total Bilirubin: 0.5 mg/dL (ref 0.2–1.2)
Total Protein: 9.2 g/dL — ABNORMAL HIGH (ref 6.1–8.1)

## 2015-02-02 NOTE — Progress Notes (Signed)
   Subjective:    Patient ID: [name] [name], female    DOB: [birth year], 54 y.o.   MRN: [id number]  HPI   This is a 54 year old African-American lady with HIV, ulcerative colitis hypertension who had been seen by my partner Dr. [name] and started on STRIBILD. Apparently she lost her coverage in terms of Medicaid and could no longer get her antiretrovirals or any of her medications. I saw her in May and She has met with [name] who will be able to get her antiretrovirals via [employer] advancing access program.  We change her now though to [facility] for better bone and kidney safety. She claims she has been taking this medicine consistently for at least the past 2-2 half months and has not missed any doses. She has been alcohol free for many years.  She does have some dental problems that need attention.   Review of Systems  Constitutional: Negative for fever, chills, diaphoresis, activity change, appetite change, fatigue and unexpected weight change.  HENT: Negative for congestion, rhinorrhea, sinus pressure, sneezing, sore throat and trouble swallowing.   Eyes: Negative for photophobia and visual disturbance.  Respiratory: Negative for cough, chest tightness, shortness of breath, wheezing and stridor.   Cardiovascular: Negative for chest pain, palpitations and leg swelling.  Gastrointestinal: Negative for nausea, vomiting, abdominal pain, diarrhea, constipation, blood in stool, abdominal distention and anal bleeding.  Genitourinary: Negative for dysuria, hematuria, flank pain and difficulty urinating.  Musculoskeletal: Negative for myalgias, back pain, joint swelling, arthralgias and gait problem.  Skin: Negative for color change, pallor, rash and wound.  Neurological: Negative for dizziness, tremors, weakness and light-headedness.  Hematological: Negative for adenopathy. Does not bruise/bleed easily.  Psychiatric/Behavioral: Negative for behavioral problems, confusion, sleep disturbance,  dysphoric mood, decreased concentration and agitation.       Objective:   Physical Exam  Constitutional: She is oriented to person, place, and time. She appears well-developed and well-nourished. No distress.  HENT:  Head: Normocephalic and atraumatic.  Mouth/Throat: No oropharyngeal exudate.  Eyes: Conjunctivae and EOM are normal. No scleral icterus.  Neck: Normal range of motion. Neck supple.  Cardiovascular: Normal rate and regular rhythm.   Pulmonary/Chest: Effort normal. No respiratory distress. She has no wheezes.  Abdominal: She exhibits no distension.  Musculoskeletal: She exhibits no edema or tenderness.  Neurological: She is alert and oriented to person, place, and time. She exhibits normal muscle tone. Coordination normal.  Skin: Skin is warm and dry. No rash noted. She is not diaphoretic. No erythema. No pallor.  Psychiatric: She has a normal mood and affect. Her behavior is normal. Judgment and thought content normal.          Assessment & Plan:   HIV: Continue GENVOYA check labs today and check labs again in 3 months.   Hypertension :  Better controlled Filed Vitals:   02/02/15 1015  BP: 126/75  Pulse: 70  Temp: 98.1 F (36.7 C)   Dental problems: refer to a ambulatory dental  I spent greater than 25 minutes with the patient including greater than 50% of time in face to face counsel of the patient's HIV infection blood pressure and dental problems and in coordination of their care.

## 2015-02-03 ENCOUNTER — Telehealth: Payer: Self-pay | Admitting: Infectious Disease

## 2015-02-03 DIAGNOSIS — R7989 Other specified abnormal findings of blood chemistry: Secondary | ICD-10-CM

## 2015-02-03 DIAGNOSIS — R945 Abnormal results of liver function studies: Secondary | ICD-10-CM

## 2015-02-03 LAB — RPR

## 2015-02-03 LAB — T-HELPER CELL (CD4) - (RCID CLINIC ONLY)
CD4 T CELL HELPER: 12 % — AB (ref 33–55)
CD4 T Cell Abs: 350 /uL — ABNORMAL LOW (ref 400–2700)

## 2015-02-03 LAB — HIV RNA, RTPCR W/R GT (RTI, PI,INT)
HIV 1 RNA Quant: <20 copies/mL (ref <20)
HIV-1 RNA Quant, Log: <1.3 {Log} (ref <1.30)

## 2015-02-03 LAB — URINE CYTOLOGY ANCILLARY ONLY
Chlamydia: NEGATIVE
Neisseria Gonorrhea: NEGATIVE

## 2015-02-03 NOTE — Telephone Encounter (Signed)
Pt has persistently elevated LFTS with pattern of biliary disease. She needs a RUQ [place] if she has orange card or coverage

## 2015-02-14 ENCOUNTER — Ambulatory Visit: Payer: Self-pay | Admitting: Family Medicine

## 2015-03-08 ENCOUNTER — Other Ambulatory Visit: Payer: Self-pay | Admitting: Family Medicine

## 2015-03-13 ENCOUNTER — Encounter: Payer: Self-pay | Admitting: Family Medicine

## 2015-03-13 ENCOUNTER — Ambulatory Visit: Payer: Self-pay | Attending: Family Medicine | Admitting: Family Medicine

## 2015-03-13 VITALS — BP 113/72 | HR 76 | Temp 98.7°F | Resp 16 | Ht 68.0 in | Wt 117.0 lb

## 2015-03-13 DIAGNOSIS — R636 Underweight: Secondary | ICD-10-CM

## 2015-03-13 DIAGNOSIS — Z0000 Encounter for general adult medical examination without abnormal findings: Secondary | ICD-10-CM

## 2015-03-13 DIAGNOSIS — I10 Essential (primary) hypertension: Secondary | ICD-10-CM

## 2015-03-13 NOTE — Patient Instructions (Addendum)
Ms. Paules,  Thank you for coming in today. F/u in 3 months for HTN  Jaid was seen today for follow-up and hypertension.  Diagnoses and all orders for this visit:  Essential hypertension  Underweight  Other orders -     Flu Vaccine QUAD 36+ mos IM

## 2015-03-13 NOTE — Progress Notes (Signed)
F/U HTN  Stated taking medication as prescribed  Hx tobacco -2-3 cigarette per day

## 2015-03-13 NOTE — Progress Notes (Signed)
Patient ID: [name] [name], female   DOB: [birth year], 54 y.o.   MRN: [id number]   Subjective:  Patient ID: [name] [name], female    DOB: [birth year]  Age: 54 y.o. MRN: [id number]  CC: Follow-up and Hypertension   HPI [name] [name] presents for HTN  1. CHRONIC HYPERTENSION  Disease Monitoring  Blood pressure range: not checking   Chest pain: no   Dyspnea: no   Claudication: no   Medication compliance: yes  Medication Side Effects  Lightheadedness: no   Urinary frequency: no   Edema: no     2. Underweight/protein calorie malnutrition: in setting of HIV. Taking remeron. Buying ensure OTC. Ensure OTC is expensive.   Social History  Substance Use Topics  . Smoking status: Current Some Day Smoker -- 0.20 packs/day for 35 years    Types: Cigarettes  . Smokeless tobacco: Never Used     Comment: Smoking 2-3 cigs per day  . Alcohol Use: No   Outpatient Prescriptions Prior to Visit  Medication Sig Dispense Refill  . amLODipine (NORVASC) 10 MG tablet Take 0.5 tablets (5 mg total) by mouth daily. 30 tablet 11  . calcium citrate-vitamin D 500-400 MG-UNIT chewable tablet Chew 1 tablet by mouth 2 (two) times daily. 180 tablet 1  . elvitegravir-cobicistat-emtricitabine-tenofovir (GENVOYA) 150-150-200-10 MG TABS tablet Take 1 tablet by mouth daily with breakfast. 30 tablet 5  . feeding supplement, ENSURE COMPLETE, (ENSURE COMPLETE) LIQD Take 237 mLs by mouth 3 (three) times daily between meals. 237 mL 0  . ferrous sulfate (FERROUSUL) 325 (65 FE) MG tablet Take 1 tablet (325 mg total) by mouth daily with breakfast. 90 tablet 1  . hydrochlorothiazide (HYDRODIURIL) 25 MG tablet Take 1 tablet (25 mg total) by mouth daily. 30 tablet 11  . hyoscyamine (LEVSIN, ANASPAZ) 0.125 MG tablet Take 1 tablet (0.125 mg total) by mouth 2 (two) times daily as needed for cramping (abd cramping). 60 tablet 5  . mirtazapine (REMERON) 15 MG tablet Take 1 tablet (15 mg total) by mouth at bedtime. 30 tablet 2  .  pantoprazole (PROTONIX) 20 MG tablet Take 1 tablet (20 mg total) by mouth 2 (two) times daily before a meal. 60 tablet 5  . sulfaSALAzine (AZULFIDINE) 500 MG tablet Take 2 tablets (1,000 mg total) by mouth 2 (two) times daily. 120 tablet 5  . Vitamin D, Ergocalciferol, (DRISDOL) 50000 UNITS CAPS capsule Take 1 capsule (50,000 Units total) by mouth every 7 (seven) days. For 12 weeks (Patient not taking: Reported on 03/13/2015) 12 capsule 0   No facility-administered medications prior to visit.    ROS Review of Systems  Constitutional: Negative for fever and chills.  Eyes: Negative for visual disturbance.  Respiratory: Negative for shortness of breath.   Cardiovascular: Negative for chest pain.  Gastrointestinal: Negative for abdominal pain and blood in stool.  Musculoskeletal: Negative for back pain and arthralgias.  Skin: Negative for rash.  Allergic/Immunologic: Negative for immunocompromised state.  Hematological: Negative for adenopathy. Does not bruise/bleed easily.  Psychiatric/Behavioral: Negative for suicidal ideas and dysphoric mood.   Objective:  BP 113/72 mmHg  Pulse 76  Temp(Src) 98.7 F (37.1 C) (Oral)  Resp 16  Ht 5' 8"  (1.727 m)  Wt 117 lb (53.071 kg)  BMI 17.79 kg/m2  SpO2 100%  BP/Weight 03/13/2015 7/82/9562 06/20/863  Systolic BP 784 696 295  Diastolic BP 72 75 76  Wt. (Lbs) 117 117.5 -  BMI 17.79 17.87 -   Wt Readings from Last 3 Encounters:  03/13/15 117 lb (  53.071 kg)  02/02/15 117 lb 8 oz (53.298 kg)  11/23/14 111 lb 6.4 oz (50.531 kg)   Physical Exam  Constitutional: She is oriented to person, place, and time. She appears well-developed. She appears cachectic. No distress.  HENT:  Head: Normocephalic and atraumatic.  Cardiovascular: Normal rate, regular rhythm, normal heart sounds and intact distal pulses.   Pulmonary/Chest: Effort normal and breath sounds normal.  Musculoskeletal: She exhibits no edema.  Neurological: She is alert and oriented to  person, place, and time.  Skin: Skin is warm and dry. No rash noted.  Psychiatric: She has a normal mood and affect.   Assessment & Plan:   Problem List Items Addressed This Visit    Healthcare maintenance   Relevant Orders   Flu Vaccine QUAD 36+ mos IM (Completed)   MM DIGITAL SCREENING BILATERAL   HTN (hypertension) - Primary (Chronic)    A: Blood pressure at goal. Meds: compliant  P: I will continue the patient's current medication regimen since her blood pressure is at goal.  norvasc 10 mg daily HCTZ 25 mg daily        Underweight    Referred to Feed 485 nutritional supplement study           No orders of the defined types were placed in this encounter.    Follow-up: No Follow-up on file.   Boykin Nearing MD

## 2015-03-13 NOTE — Assessment & Plan Note (Addendum)
Referred to Feed 485 nutritional supplement study

## 2015-03-13 NOTE — Assessment & Plan Note (Signed)
A: Blood pressure at goal. Meds: compliant  P: I will continue the patient's current medication regimen since her blood pressure is at goal.  norvasc 10 mg daily HCTZ 25 mg daily

## 2015-03-20 ENCOUNTER — Ambulatory Visit: Payer: Self-pay

## 2015-03-31 ENCOUNTER — Ambulatory Visit: Payer: Self-pay

## 2015-04-12 ENCOUNTER — Encounter: Payer: Self-pay | Admitting: *Deleted

## 2015-04-18 ENCOUNTER — Other Ambulatory Visit: Payer: Self-pay

## 2015-04-19 ENCOUNTER — Telehealth: Payer: Self-pay | Admitting: Family Medicine

## 2015-04-19 DIAGNOSIS — R636 Underweight: Secondary | ICD-10-CM

## 2015-04-19 MED ORDER — MIRTAZAPINE 15 MG PO TABS: 15.0000 mg | ORAL_TABLET | Freq: Every day | ORAL | Status: DC

## 2015-04-19 NOTE — Telephone Encounter (Signed)
Rx Remaron refilled Pt has HCTZ refills  Pt notified

## 2015-04-19 NOTE — Telephone Encounter (Signed)
Patient is stating that her ADAP program with [facility] is pending and is unable to get these two medications filled at a discounted rate. She is hoping that these medications can be filled here at our pharmacy. Patient stated that pharmacy has filled them before for her. Please follow up with pt if she is able to obtain a refill. Thank you.   mirtazapine (REMERON) 15 MG tablet hydrochlorothiazide (HYDRODIURIL) 25 MG tablet

## 2015-04-25 ENCOUNTER — Other Ambulatory Visit: Payer: Self-pay | Admitting: *Deleted

## 2015-04-25 MED ORDER — ELVITEG-COBIC-EMTRICIT-TENOFAF 150-150-200-10 MG PO TABS: 1.0000 | ORAL_TABLET | Freq: Every day | ORAL | Status: DC

## 2015-05-01 ENCOUNTER — Other Ambulatory Visit: Payer: Self-pay | Admitting: Family Medicine

## 2015-05-03 ENCOUNTER — Ambulatory Visit (INDEPENDENT_AMBULATORY_CARE_PROVIDER_SITE_OTHER): Payer: Self-pay | Admitting: Infectious Disease

## 2015-05-03 ENCOUNTER — Encounter: Payer: Self-pay | Admitting: Infectious Disease

## 2015-05-03 VITALS — BP 139/91 | HR 92 | Temp 97.5°F | Wt 125.0 lb

## 2015-05-03 DIAGNOSIS — I10 Essential (primary) hypertension: Secondary | ICD-10-CM

## 2015-05-03 DIAGNOSIS — F17200 Nicotine dependence, unspecified, uncomplicated: Secondary | ICD-10-CM

## 2015-05-03 DIAGNOSIS — Z720 Tobacco use: Secondary | ICD-10-CM

## 2015-05-03 DIAGNOSIS — B20 Human immunodeficiency virus [HIV] disease: Secondary | ICD-10-CM

## 2015-05-03 NOTE — Progress Notes (Signed)
Chief complaint: Follow-up for HIV on medications Subjective:    Patient ID: [name] [name], female    DOB: [birth year], 54 y.o.   MRN: [id number]  HPI   This is a 54 year-old African-American lady with HIV, ulcerative colitis hypertension who had been seen by my partner Dr. [name] and started on STRIBILD. Apparently along the way she lost her coverage in terms of Medicaid and could no longer get her antiretrovirals or any of her medications. I saw her in May and She has met with [name] who will be able to get her antiretrovirals via [employer] advancing access program.  We changed her now though to [facility] for better bone and kidney safety. She claims she has been taking this medicine consistently  and has not missed any doses.   Lab Results  Component Value Date   HIV1RNAQUANT <20 02/02/2015   Lab Results  Component Value Date   CD4TABS 350* 02/02/2015   CD4TABS 360* 10/26/2014   CD4TABS 420 05/11/2014   Past Medical History  Diagnosis Date  . Ulcerative colitis ([place])   . GI bleed   . GERD (gastroesophageal reflux disease) Dx 2014  . Arthritis   . HIV (human immunodeficiency virus infection) ([place]) Dx 2015  . Hypertension Dx 2014  . Substance abuse     Quit 2008  . Cavities 02/02/2015    Past Surgical History  Procedure Laterality Date  . Video bronchoscopy Bilateral 04/27/2014    Procedure: VIDEO BRONCHOSCOPY WITH FLUORO;  Surgeon: Rigoberto Noel, MD;  Location: [facility] ENDOSCOPY;  Service: Cardiopulmonary;  Laterality: Bilateral;  . Tee without cardioversion N/A 05/02/2014    Procedure: TRANSESOPHAGEAL ECHOCARDIOGRAM (TEE);  Surgeon: Larey Dresser, MD;  Location: [facility] ENDOSCOPY;  Service: Cardiovascular;  Laterality: N/A;  . Abdominal hysterectomy  2001     done in [facility], patient unsure of indication    Family History  Problem Relation Age of Onset  . Cancer Father     ?  Marland Kitchen Alcoholism Father   . Diabetes Mother   . Stroke Mother   . Pancreatitis Sister   .  Diabetes Sister       Social History   Social History  . Marital Status: Single    Spouse Name: N/A  . Number of Children: N/A  . Years of Education: N/A   Social History Main Topics  . Smoking status: Current Some Day Smoker -- 0.20 packs/day for 35 years    Types: Cigarettes  . Smokeless tobacco: Never Used     Comment: Smoking 2-3 cigs per day  . Alcohol Use: No  . Drug Use: No     Comment: no drugs per patient since 2009  . Sexual Activity: Yes    Birth Control/ Protection: Other-see comments, Surgical     Comment: pt. given condoms   Other Topics Concern  . None   Social History Narrative    No Known Allergies   Current outpatient prescriptions:  .  amLODipine (NORVASC) 10 MG tablet, Take 0.5 tablets (5 mg total) by mouth daily., Disp: 30 tablet, Rfl: 11 .  amLODipine (NORVASC) 10 MG tablet, TAKE 1 TABLET BY MOUTH DAILY, Disp: 90 tablet, Rfl: 0 .  calcium citrate-vitamin D 500-400 MG-UNIT chewable tablet, Chew 1 tablet by mouth 2 (two) times daily., Disp: 180 tablet, Rfl: 1 .  elvitegravir-cobicistat-emtricitabine-tenofovir (GENVOYA) 150-150-200-10 MG TABS tablet, Take 1 tablet by mouth daily with breakfast., Disp: 30 tablet, Rfl: 5 .  feeding supplement, ENSURE COMPLETE, (ENSURE COMPLETE)  LIQD, Take 237 mLs by mouth 3 (three) times daily between meals., Disp: 237 mL, Rfl: 0 .  ferrous sulfate (FERROUSUL) 325 (65 FE) MG tablet, Take 1 tablet (325 mg total) by mouth daily with breakfast., Disp: 90 tablet, Rfl: 1 .  hydrochlorothiazide (HYDRODIURIL) 25 MG tablet, Take 1 tablet (25 mg total) by mouth daily., Disp: 30 tablet, Rfl: 11 .  hyoscyamine (LEVSIN, ANASPAZ) 0.125 MG tablet, Take 1 tablet (0.125 mg total) by mouth 2 (two) times daily as needed for cramping (abd cramping)., Disp: 60 tablet, Rfl: 5 .  mirtazapine (REMERON) 15 MG tablet, Take 1 tablet (15 mg total) by mouth at bedtime., Disp: 30 tablet, Rfl: 2 .  pantoprazole (PROTONIX) 20 MG tablet, Take 1 tablet (20  mg total) by mouth 2 (two) times daily before a meal., Disp: 60 tablet, Rfl: 5 .  sulfaSALAzine (AZULFIDINE) 500 MG tablet, Take 2 tablets (1,000 mg total) by mouth 2 (two) times daily., Disp: 120 tablet, Rfl: 5   Review of Systems  Constitutional: Negative for fever, chills, diaphoresis, activity change, appetite change, fatigue and unexpected weight change.  HENT: Negative for congestion, rhinorrhea, sinus pressure, sneezing, sore throat and trouble swallowing.   Eyes: Negative for photophobia and visual disturbance.  Respiratory: Negative for cough, chest tightness, shortness of breath, wheezing and stridor.   Cardiovascular: Negative for chest pain, palpitations and leg swelling.  Gastrointestinal: Negative for nausea, vomiting, abdominal pain, diarrhea, constipation, blood in stool, abdominal distention and anal bleeding.  Genitourinary: Negative for dysuria, hematuria, flank pain and difficulty urinating.  Musculoskeletal: Negative for myalgias, back pain, joint swelling, arthralgias and gait problem.  Skin: Negative for color change, pallor, rash and wound.  Neurological: Negative for dizziness, tremors, weakness and light-headedness.  Hematological: Negative for adenopathy. Does not bruise/bleed easily.  Psychiatric/Behavioral: Negative for behavioral problems, confusion, sleep disturbance, dysphoric mood, decreased concentration and agitation.       Objective:   Physical Exam  Constitutional: She is oriented to person, place, and time. She appears well-developed and well-nourished. No distress.  HENT:  Head: Normocephalic and atraumatic.  Mouth/Throat: No oropharyngeal exudate.  Eyes: Conjunctivae and EOM are normal. No scleral icterus.  Neck: Normal range of motion. Neck supple.  Cardiovascular: Normal rate and regular rhythm.   Pulmonary/Chest: Effort normal. No respiratory distress. She has no wheezes.  Abdominal: She exhibits no distension.  Musculoskeletal: She exhibits  no edema or tenderness.  Neurological: She is alert and oriented to person, place, and time. She exhibits normal muscle tone. Coordination normal.  Skin: Skin is warm and dry. No rash noted. She is not diaphoretic. No erythema. No pallor.  Psychiatric: She has a normal mood and affect. Her behavior is normal. Judgment and thought content normal.          Assessment & Plan:   HIV: Continue GENVOYA.    Hypertension :  Better controlled Filed Vitals:   05/03/15 0925  BP: 139/91  Pulse: 92  Temp: 97.5 F (36.4 C)   Smoking: she is cutting down.  CV risk: calculated at 13+% and LDL less than 150 should qualify for REPRIEVE  I spent greater than 25 minutes with the patient including greater than 50% of time in face to face counsel of the patient's HIV infection blood pressure and in coordination of her care.

## 2015-05-08 ENCOUNTER — Encounter (INDEPENDENT_AMBULATORY_CARE_PROVIDER_SITE_OTHER): Payer: Self-pay | Admitting: *Deleted

## 2015-05-08 VITALS — BP 129/78 | HR 75 | Temp 98.4°F | Resp 16 | Wt 123.2 lb

## 2015-05-08 DIAGNOSIS — R350 Frequency of micturition: Secondary | ICD-10-CM

## 2015-05-08 DIAGNOSIS — Z006 Encounter for examination for normal comparison and control in clinical research program: Secondary | ICD-10-CM

## 2015-05-08 LAB — CBC WITH DIFFERENTIAL/PLATELET
Basophils Absolute: 0 10*3/uL (ref 0.0–0.1)
Basophils Relative: 0 % (ref 0–1)
EOS PCT: 5 % (ref 0–5)
Eosinophils Absolute: 0.3 10*3/uL (ref 0.0–0.7)
HEMATOCRIT: 34.9 % — AB (ref 36.0–46.0)
Hemoglobin: 11.6 g/dL — ABNORMAL LOW (ref 12.0–15.0)
LYMPHS PCT: 59 % — AB (ref 12–46)
Lymphs Abs: 3.1 10*3/uL (ref 0.7–4.0)
MCH: 31.4 pg (ref 26.0–34.0)
MCHC: 33.2 g/dL (ref 30.0–36.0)
MCV: 94.3 fL (ref 78.0–100.0)
MONO ABS: 0.4 10*3/uL (ref 0.1–1.0)
MPV: 10 fL (ref 8.6–12.4)
Monocytes Relative: 8 % (ref 3–12)
Neutro Abs: 1.5 10*3/uL — ABNORMAL LOW (ref 1.7–7.7)
Neutrophils Relative %: 28 % — ABNORMAL LOW (ref 43–77)
Platelets: 454 10*3/uL — ABNORMAL HIGH (ref 150–400)
RBC: 3.7 MIL/uL — AB (ref 3.87–5.11)
RDW: 15 % (ref 11.5–15.5)
WBC: 5.3 10*3/uL (ref 4.0–10.5)

## 2015-05-08 LAB — COMPREHENSIVE METABOLIC PANEL
ALK PHOS: 840 U/L — AB (ref 33–130)
ALT: 66 U/L — ABNORMAL HIGH (ref 6–29)
AST: 71 U/L — AB (ref 10–35)
Albumin: 4.1 g/dL (ref 3.6–5.1)
BILIRUBIN TOTAL: 0.6 mg/dL (ref 0.2–1.2)
BUN: 14 mg/dL (ref 7–25)
CALCIUM: 9.9 mg/dL (ref 8.6–10.4)
CHLORIDE: 101 mmol/L (ref 98–110)
CO2: 26 mmol/L (ref 20–31)
CREATININE: 0.76 mg/dL (ref 0.50–1.05)
Glucose, Bld: 75 mg/dL (ref 65–99)
POTASSIUM: 4.1 mmol/L (ref 3.5–5.3)
Sodium: 138 mmol/L (ref 135–146)
Total Protein: 9.3 g/dL — ABNORMAL HIGH (ref 6.1–8.1)

## 2015-05-08 LAB — LIPID PANEL
CHOLESTEROL: 270 mg/dL — AB (ref 125–200)
HDL: 62 mg/dL (ref >=46)
LDL Cholesterol: 182 mg/dL — ABNORMAL HIGH (ref <130)
TRIGLYCERIDES: 130 mg/dL (ref <150)
Total CHOL/HDL Ratio: 4.4 Ratio (ref <=5.0)
VLDL: 26 mg/dL (ref <30)

## 2015-05-08 NOTE — Progress Notes (Unsigned)
Simon here today to screen for [phone], Randomized Trial to prevent Vascular Events in HIV (REPRIEVE study). Reviewed informed consent with participant. Explained study risks, benefits, responsibilities and answered questions. Verbalized understanding. Reviewed medical history, signs & symptoms and medications. Complaining of recent onst of urinary frequency. Denied dysuria or urinary urgency. Obtained U/A. Obtained screening blood work as well. Will contact [place] with entry visit date/time once screening labs received and reviewed. Maddix Heinz, [place]

## 2015-05-09 LAB — URINALYSIS, ROUTINE W REFLEX MICROSCOPIC
BILIRUBIN URINE: NEGATIVE
Glucose, UA: NEGATIVE
HGB URINE DIPSTICK: NEGATIVE
KETONES UR: NEGATIVE
Leukocytes, UA: NEGATIVE
NITRITE: NEGATIVE
Protein, ur: NEGATIVE
SPECIFIC GRAVITY, URINE: 1.017 (ref 1.001–1.035)
pH: 5.5 (ref 5.0–8.0)

## 2015-05-09 NOTE — Progress Notes (Signed)
Notified [facility] via fax. Landis Gandy, RN

## 2015-05-16 ENCOUNTER — Encounter: Payer: Self-pay | Admitting: *Deleted

## 2015-05-16 NOTE — Progress Notes (Unsigned)
Received screening lab results for [phone] (REPRIEVE study). Notified [name] [name] that she did not meet the eligibility criteria for study entry. States understanding. Teona Vargus, [place] [facility]

## 2015-05-16 NOTE — Progress Notes (Unsigned)
Patient ID: [name] [name], female   DOB: 08-22-1960, 54 y.o.   MRN: [id number]

## 2015-05-24 ENCOUNTER — Encounter: Payer: Self-pay | Admitting: Family Medicine

## 2015-05-24 ENCOUNTER — Ambulatory Visit: Payer: Self-pay | Attending: Family Medicine | Admitting: Family Medicine

## 2015-05-24 VITALS — BP 130/74 | HR 110 | Temp 98.7°F | Resp 16 | Ht 68.0 in | Wt 119.0 lb

## 2015-05-24 DIAGNOSIS — R350 Frequency of micturition: Secondary | ICD-10-CM | POA: Insufficient documentation

## 2015-05-24 DIAGNOSIS — I10 Essential (primary) hypertension: Secondary | ICD-10-CM | POA: Insufficient documentation

## 2015-05-24 DIAGNOSIS — Z79899 Other long term (current) drug therapy: Secondary | ICD-10-CM | POA: Insufficient documentation

## 2015-05-24 DIAGNOSIS — J069 Acute upper respiratory infection, unspecified: Secondary | ICD-10-CM | POA: Insufficient documentation

## 2015-05-24 DIAGNOSIS — F17210 Nicotine dependence, cigarettes, uncomplicated: Secondary | ICD-10-CM | POA: Insufficient documentation

## 2015-05-24 DIAGNOSIS — B20 Human immunodeficiency virus [HIV] disease: Secondary | ICD-10-CM | POA: Insufficient documentation

## 2015-05-24 DIAGNOSIS — B9789 Other viral agents as the cause of diseases classified elsewhere: Secondary | ICD-10-CM

## 2015-05-24 LAB — POCT URINALYSIS DIPSTICK
BILIRUBIN UA: NEGATIVE
GLUCOSE UA: NEGATIVE
Ketones, UA: NEGATIVE
LEUKOCYTES UA: NEGATIVE
NITRITE UA: NEGATIVE
Protein, UA: 30
RBC UA: NEGATIVE
Spec Grav, UA: 1.02
Urobilinogen, UA: 0.2
pH, UA: 5.5

## 2015-05-24 MED ORDER — AMOXICILLIN 500 MG PO CAPS: 500.0000 mg | ORAL_CAPSULE | Freq: Three times a day (TID) | ORAL | Status: DC

## 2015-05-24 MED ORDER — ACETAMINOPHEN-CODEINE #3 300-30 MG PO TABS: 1.0000 | ORAL_TABLET | Freq: Three times a day (TID) | ORAL | Status: DC | PRN

## 2015-05-24 MED ORDER — BENZONATATE 100 MG PO CAPS: 100.0000 mg | ORAL_CAPSULE | Freq: Two times a day (BID) | ORAL | Status: DC | PRN

## 2015-05-24 MED ORDER — CETIRIZINE HCL 10 MG PO TABS: 10.0000 mg | ORAL_TABLET | Freq: Every day | ORAL | Status: DC

## 2015-05-24 NOTE — Progress Notes (Signed)
C/C Cold Sx dry cough, frequency urinating.x 2 days  Tobacco user- no cigarette x2 days Pain scale #9  No suicidal thought in the past two weeks

## 2015-05-24 NOTE — Progress Notes (Signed)
Patient ID: [name] [name], female   DOB: 1961-06-03, 54 y.o.   MRN: [id number]   Subjective:  Patient ID: [name] [name], female    DOB: 11-30-1960  Age: 54 y.o. MRN: [id number]  CC: Cough and Urinary Frequency   HPI [name] [name] is a 54 yo F with HIV and HTN, she presents for the following:    1. Cough: x 5 days. Productive during the day. Dry at night. No SOB. CP with coughing. Grandchild is 56 she has cough and is now better. Feels chills and hot a night. No documented fever. No dizzy or lightheaded. Sipping liquids.   2. Urinary frequency: since coughing frequently. No dysuria. No hematuria. No fever or flank or lower abdominal pain.   Social History  Substance Use Topics  . Smoking status: Current Some Day Smoker -- 0.20 packs/day for 35 years    Types: Cigarettes  . Smokeless tobacco: Never Used     Comment: Smoking 2-3 cigs per day  . Alcohol Use: No    Outpatient Prescriptions Prior to Visit  Medication Sig Dispense Refill  . amLODipine (NORVASC) 10 MG tablet Take 0.5 tablets (5 mg total) by mouth daily. 30 tablet 11  . amLODipine (NORVASC) 10 MG tablet TAKE 1 TABLET BY MOUTH DAILY 90 tablet 0  . calcium citrate-vitamin D 500-400 MG-UNIT chewable tablet Chew 1 tablet by mouth 2 (two) times daily. 180 tablet 1  . elvitegravir-cobicistat-emtricitabine-tenofovir (GENVOYA) 150-150-200-10 MG TABS tablet Take 1 tablet by mouth daily with breakfast. 30 tablet 5  . feeding supplement, ENSURE COMPLETE, (ENSURE COMPLETE) LIQD Take 237 mLs by mouth 3 (three) times daily between meals. 237 mL 0  . hydrochlorothiazide (HYDRODIURIL) 25 MG tablet Take 1 tablet (25 mg total) by mouth daily. 30 tablet 11  . hyoscyamine (LEVSIN, ANASPAZ) 0.125 MG tablet Take 1 tablet (0.125 mg total) by mouth 2 (two) times daily as needed for cramping (abd cramping). 60 tablet 5  . mirtazapine (REMERON) 15 MG tablet Take 1 tablet (15 mg total) by mouth at bedtime. 30 tablet 2  . pantoprazole (PROTONIX) 20 MG tablet  Take 1 tablet (20 mg total) by mouth 2 (two) times daily before a meal. 60 tablet 5  . sulfaSALAzine (AZULFIDINE) 500 MG tablet Take 2 tablets (1,000 mg total) by mouth 2 (two) times daily. 120 tablet 5  . ferrous sulfate 325 (65 FE) MG tablet TAKE 1 TABLET BY MOUTH DAILY WITH BREAKFAST (Patient not taking: Reported on 05/24/2015) 90 tablet 1   No facility-administered medications prior to visit.    ROS Review of Systems  Constitutional: Negative for fever and chills.  HENT: Positive for congestion.   Eyes: Negative for visual disturbance.  Respiratory: Positive for cough. Negative for shortness of breath.   Cardiovascular: Negative for chest pain.  Gastrointestinal: Negative for abdominal pain and blood in stool.  Genitourinary: Positive for frequency.  Musculoskeletal: Negative for back pain and arthralgias.  Skin: Negative for rash.  Allergic/Immunologic: Negative for immunocompromised state.  Hematological: Negative for adenopathy. Does not bruise/bleed easily.  Psychiatric/Behavioral: Negative for suicidal ideas and dysphoric mood.    Objective:  BP 130/74 mmHg  Pulse 110  Temp(Src) 98.7 F (37.1 C) (Oral)  Resp 16  Ht 5' 8"  (1.727 m)  Wt 119 lb (53.978 kg)  BMI 18.10 kg/m2  SpO2 98%  BP/Weight 05/24/2015 05/08/2015 19/50/9326  Systolic BP 712 458 099  Diastolic BP 74 78 91  Wt. (Lbs) 119 123.25 125  BMI 18.1 18.74 19.01  Physical Exam  Constitutional: She is oriented to person, place, and time. She appears well-developed. She appears cachectic. No distress.  HENT:  Head: Normocephalic and atraumatic.  Right Ear: External ear normal.  Left Ear: External ear normal.  Nose: Mucosal edema present. Right sinus exhibits no maxillary sinus tenderness and no frontal sinus tenderness. Left sinus exhibits no maxillary sinus tenderness and no frontal sinus tenderness.  Mouth/Throat: Oropharynx is clear and moist. No oropharyngeal exudate.  Cardiovascular: Normal rate,  regular rhythm, normal heart sounds and intact distal pulses.   Pulmonary/Chest: Effort normal. No respiratory distress. She has no wheezes. She has rales. She exhibits no tenderness.  Musculoskeletal: She exhibits no edema.  Lymphadenopathy:    She has no cervical adenopathy.  Neurological: She is alert and oriented to person, place, and time.  Skin: Skin is warm and dry. No rash noted.  Psychiatric: She has a normal mood and affect.    Assessment & Plan:   Problem List Items Addressed This Visit    Viral URI with cough    A: viral URI with cough in HIV + patient P: Symptomatic treatment with tylenol #3 for cough and CP Zyrtec for rhinitis   amox rx give patient asked to hold until 05/30/15 as viral URI will improve in time without antibiotic       Relevant Medications   cetirizine (ZYRTEC) 10 MG tablet   acetaminophen-codeine (TYLENOL #3) 300-30 MG tablet   benzonatate (TESSALON) 100 MG capsule   amoxicillin (AMOXIL) 500 MG capsule    Other Visit Diagnoses    Urinary frequency    -  Primary    Relevant Orders    POCT urinalysis dipstick (Completed)       No orders of the defined types were placed in this encounter.    Follow-up: No Follow-up on file.   Boykin Nearing MD

## 2015-05-24 NOTE — Assessment & Plan Note (Signed)
A: viral URI with cough in HIV + patient P: Symptomatic treatment with tylenol #3 for cough and CP Zyrtec for rhinitis   amox rx give patient asked to hold until 05/30/15 as viral URI will improve in time without antibiotic

## 2015-05-24 NOTE — Patient Instructions (Addendum)
[name] [name] was seen today for cough and urinary frequency.  Diagnoses and all orders for this visit:  Urinary frequency -     POCT urinalysis dipstick  Viral URI with cough -     cetirizine (ZYRTEC) 10 MG tablet; Take 1 tablet (10 mg total) by mouth daily. -     acetaminophen-codeine (TYLENOL #3) 300-30 MG tablet; Take 1 tablet by mouth every 8 (eight) hours as needed for moderate pain (and cough). -     benzonatate (TESSALON) 100 MG capsule; Take 1 capsule (100 mg total) by mouth 2 (two) times daily as needed for cough. -     amoxicillin (AMOXIL) 500 MG capsule; Take 1 capsule (500 mg total) by mouth 3 (three) times daily. Start on 05/30/2015 if you are not feeling much better by then    You have a viral URI with cough. For this please do the following:  1. Drink plenty of fluids. Hot tea, soup etc will help open your nasal passages. 2. Dextromethorphan 30 mg every 6-8 hrs (plain Robitussin) for cough suppression 3. Tylenol for pain up to 1000 mg three times daily.  4. Nasal saline-OTC nose spray for congestion.   F/u as needed for chest pain, shortness of breath, persistent high fever.  Dr. Adrian Blackwater

## 2015-06-22 ENCOUNTER — Other Ambulatory Visit: Payer: Self-pay | Admitting: Family Medicine

## 2015-06-29 ENCOUNTER — Other Ambulatory Visit: Payer: Self-pay | Admitting: Family Medicine

## 2015-06-29 MED FILL — PANTOPRAZOLE SOD DR 20 MG T: 20 | 30 days supply | Qty: 60 | Fill #5

## 2015-06-29 MED FILL — sulfaSALAzine 500 MG TABS: 500 | 15 days supply | Qty: 60 | Fill #1

## 2015-06-30 ENCOUNTER — Telehealth: Payer: Self-pay | Admitting: *Deleted

## 2015-06-30 MED FILL — HYOSCYAMINE SULF 0.125 MG T: 0.125 | 30 days supply | Qty: 60 | Fill #0

## 2015-06-30 NOTE — Telephone Encounter (Signed)
Unable to contact Pt  Wrong number  Rx Tylenol #3 at front office

## 2015-07-03 ENCOUNTER — Other Ambulatory Visit: Payer: Self-pay | Admitting: *Deleted

## 2015-07-03 ENCOUNTER — Other Ambulatory Visit: Payer: Self-pay | Admitting: Family Medicine

## 2015-07-03 ENCOUNTER — Encounter: Payer: Self-pay | Admitting: Infectious Disease

## 2015-07-03 ENCOUNTER — Ambulatory Visit (INDEPENDENT_AMBULATORY_CARE_PROVIDER_SITE_OTHER): Payer: Self-pay | Admitting: Infectious Disease

## 2015-07-03 VITALS — BP 114/75 | HR 87 | Temp 98.2°F | Wt 126.0 lb

## 2015-07-03 DIAGNOSIS — Z720 Tobacco use: Secondary | ICD-10-CM

## 2015-07-03 DIAGNOSIS — B20 Human immunodeficiency virus [HIV] disease: Secondary | ICD-10-CM

## 2015-07-03 DIAGNOSIS — E785 Hyperlipidemia, unspecified: Secondary | ICD-10-CM

## 2015-07-03 DIAGNOSIS — Z113 Encounter for screening for infections with a predominantly sexual mode of transmission: Secondary | ICD-10-CM

## 2015-07-03 DIAGNOSIS — F17200 Nicotine dependence, unspecified, uncomplicated: Secondary | ICD-10-CM

## 2015-07-03 DIAGNOSIS — I10 Essential (primary) hypertension: Secondary | ICD-10-CM

## 2015-07-03 LAB — CBC WITH DIFFERENTIAL/PLATELET
BASOS PCT: 0 % (ref 0–1)
Basophils Absolute: 0 10*3/uL (ref 0.0–0.1)
EOS ABS: 0.4 10*3/uL (ref 0.0–0.7)
EOS PCT: 8 % — AB (ref 0–5)
HCT: 34.6 % — ABNORMAL LOW (ref 36.0–46.0)
HEMOGLOBIN: 11.6 g/dL — AB (ref 12.0–15.0)
Lymphocytes Relative: 48 % — ABNORMAL HIGH (ref 12–46)
Lymphs Abs: 2.6 10*3/uL (ref 0.7–4.0)
MCH: 31.3 pg (ref 26.0–34.0)
MCHC: 33.5 g/dL (ref 30.0–36.0)
MCV: 93.3 fL (ref 78.0–100.0)
MONO ABS: 0.5 10*3/uL (ref 0.1–1.0)
MONOS PCT: 9 % (ref 3–12)
MPV: 9.6 fL (ref 8.6–12.4)
Neutro Abs: 1.9 10*3/uL (ref 1.7–7.7)
Neutrophils Relative %: 35 % — ABNORMAL LOW (ref 43–77)
PLATELETS: 517 10*3/uL — AB (ref 150–400)
RBC: 3.71 MIL/uL — ABNORMAL LOW (ref 3.87–5.11)
RDW: 15.9 % — AB (ref 11.5–15.5)
WBC: 5.5 10*3/uL (ref 4.0–10.5)

## 2015-07-03 LAB — COMPLETE METABOLIC PANEL WITH GFR
ALBUMIN: 4 g/dL (ref 3.6–5.1)
ALK PHOS: 745 U/L — AB (ref 33–130)
ALT: 58 U/L — AB (ref 6–29)
AST: 73 U/L — AB (ref 10–35)
BUN: 21 mg/dL (ref 7–25)
CALCIUM: 9.9 mg/dL (ref 8.6–10.4)
CO2: 28 mmol/L (ref 20–31)
CREATININE: 0.73 mg/dL (ref 0.50–1.05)
Chloride: 98 mmol/L (ref 98–110)
GFR, Est Non African American: >89 mL/min (ref >=60)
Glucose, Bld: 83 mg/dL (ref 65–99)
Potassium: 4 mmol/L (ref 3.5–5.3)
Sodium: 135 mmol/L (ref 135–146)
Total Bilirubin: 0.6 mg/dL (ref 0.2–1.2)
Total Protein: 8.7 g/dL — ABNORMAL HIGH (ref 6.1–8.1)

## 2015-07-03 MED ORDER — ELVITEG-COBIC-EMTRICIT-TENOFAF 150-150-200-10 MG PO TABS: 1.0000 | ORAL_TABLET | Freq: Every day | ORAL | Status: DC

## 2015-07-03 MED ORDER — AMLODIPINE BESYLATE 10 MG PO TABS: 5.0000 mg | ORAL_TABLET | Freq: Every day | ORAL | Status: DC

## 2015-07-03 MED ORDER — HYDROCHLOROTHIAZIDE 25 MG PO TABS: 25.0000 mg | ORAL_TABLET | Freq: Every day | ORAL | Status: DC

## 2015-07-03 MED ORDER — ATORVASTATIN CALCIUM 10 MG PO TABS
10.0000 mg | ORAL_TABLET | Freq: Every day | ORAL | Status: DC
Start: 2015-07-03 — End: 2016-09-12

## 2015-07-03 MED FILL — ?ATORVASTATIN 10 MG TABLET: 10 | 30 days supply | Qty: 30 | Fill #0

## 2015-07-03 NOTE — Telephone Encounter (Signed)
Pt. Came in today requesting a med refill on sulfaSALAzine (AZULFIDINE) 500 MG tablet. Please f/u with pt.

## 2015-07-03 NOTE — Progress Notes (Signed)
Chief complaint: Follow-up for HIV on medications Subjective:    Patient ID: [name] [name], female    DOB: 12-11-1960, 55 y.o.   MRN: [id number]  HPI   This is a 55 year-old African-American lady with HIV, ulcerative colitis hypertension who had been seen by my partner Dr. [name] and started on STRIBILD. Apparently along the way she lost her coverage in terms of Medicaid and could no longer get her antiretrovirals or any of her medications. I saw her in May of 2016 and She has met with [name] who was be able to get her antiretrovirals via [employer] advancing access program.  We changed her now though to [facility] for better bone and kidney safety. She claims she has been taking this medicine consistently  and has not missed any doses.   Lab Results  Component Value Date   HIV1RNAQUANT <20 02/02/2015   Lab Results  Component Value Date   CD4TABS 350* 02/02/2015   CD4TABS 360* 10/26/2014   CD4TABS 420 05/11/2014   Past Medical History  Diagnosis Date  . Ulcerative colitis ([place])   . GI bleed   . GERD (gastroesophageal reflux disease) Dx 2014  . Arthritis   . HIV (human immunodeficiency virus infection) ([place]) Dx 2015  . Hypertension Dx 2014  . Substance abuse     Quit 2008  . Cavities 02/02/2015    Past Surgical History  Procedure Laterality Date  . Video bronchoscopy Bilateral 04/27/2014    Procedure: VIDEO BRONCHOSCOPY WITH FLUORO;  Surgeon: Rigoberto Noel, MD;  Location: [facility] ENDOSCOPY;  Service: Cardiopulmonary;  Laterality: Bilateral;  . Tee without cardioversion N/A 05/02/2014    Procedure: TRANSESOPHAGEAL ECHOCARDIOGRAM (TEE);  Surgeon: Larey Dresser, MD;  Location: [facility] ENDOSCOPY;  Service: Cardiovascular;  Laterality: N/A;  . Abdominal hysterectomy  2001     done in [facility], patient unsure of indication    Family History  Problem Relation Age of Onset  . Cancer Father     ?  Marland Kitchen Alcoholism Father   . Diabetes Mother   . Stroke Mother   . Pancreatitis  Sister   . Diabetes Sister       Social History   Social History  . Marital Status: Single    Spouse Name: N/A  . Number of Children: N/A  . Years of Education: N/A   Social History Main Topics  . Smoking status: Current Some Day Smoker -- 0.20 packs/day for 35 years    Types: Cigarettes  . Smokeless tobacco: Never Used     Comment: Smoking 2-3 cigs per day  . Alcohol Use: No  . Drug Use: No     Comment: no drugs per patient since 2009  . Sexual Activity: Yes    Birth Control/ Protection: Other-see comments, Surgical     Comment: pt. given condoms   Other Topics Concern  . None   Social History Narrative    No Known Allergies   Current outpatient prescriptions:  .  acetaminophen-codeine (TYLENOL #3) 300-30 MG tablet, TAKE 1 TABLET BY MOUTH EVERY 8 HOURS AS NEEDED FOR MODERATE PAIN AND COUGH, Disp: 30 tablet, Rfl: 0 .  amLODipine (NORVASC) 10 MG tablet, Take 0.5 tablets (5 mg total) by mouth daily., Disp: 30 tablet, Rfl: 11 .  benzonatate (TESSALON) 100 MG capsule, Take 1 capsule (100 mg total) by mouth 2 (two) times daily as needed for cough., Disp: 20 capsule, Rfl: 0 .  calcium citrate-vitamin D 500-400 MG-UNIT chewable tablet, Chew 1 tablet  by mouth 2 (two) times daily., Disp: 180 tablet, Rfl: 1 .  elvitegravir-cobicistat-emtricitabine-tenofovir (GENVOYA) 150-150-200-10 MG TABS tablet, Take 1 tablet by mouth daily with breakfast., Disp: 30 tablet, Rfl: 5 .  ferrous sulfate 325 (65 FE) MG tablet, TAKE 1 TABLET BY MOUTH DAILY WITH BREAKFAST, Disp: 90 tablet, Rfl: 1 .  hydrochlorothiazide (HYDRODIURIL) 25 MG tablet, TAKE 1 TABLET BY MOUTH DAILY, Disp: 90 tablet, Rfl: 0 .  hyoscyamine (LEVSIN, ANASPAZ) 0.125 MG tablet, TAKE 1 TABLET BY MOUTH 2 TIMES DAILY AS NEEDED FOR CRAMPING, Disp: 180 tablet, Rfl: 1 .  mirtazapine (REMERON) 15 MG tablet, Take 1 tablet (15 mg total) by mouth at bedtime., Disp: 30 tablet, Rfl: 2 .  pantoprazole (PROTONIX) 20 MG tablet, Take 1 tablet (20 mg  total) by mouth 2 (two) times daily before a meal., Disp: 60 tablet, Rfl: 5 .  sulfaSALAzine (AZULFIDINE) 500 MG tablet, Take 2 tablets (1,000 mg total) by mouth 2 (two) times daily., Disp: 120 tablet, Rfl: 5 .  atorvastatin (LIPITOR) 10 MG tablet, Take 1 tablet (10 mg total) by mouth daily., Disp: 30 tablet, Rfl: 11 .  cetirizine (ZYRTEC) 10 MG tablet, Take 1 tablet (10 mg total) by mouth daily. (Patient not taking: Reported on 07/03/2015), Disp: 30 tablet, Rfl: 11 .  feeding supplement, ENSURE COMPLETE, (ENSURE COMPLETE) LIQD, Take 237 mLs by mouth 3 (three) times daily between meals. (Patient not taking: Reported on 07/03/2015), Disp: 237 mL, Rfl: 0 .  sulfaSALAzine (AZULFIDINE) 500 MG tablet, TAKE 2 TABLETS BY MOUTH 2 TIMES DAILY, Disp: 120 tablet, Rfl: 2   Review of Systems  Constitutional: Negative for fever, chills, diaphoresis, activity change, appetite change, fatigue and unexpected weight change.  HENT: Negative for congestion, rhinorrhea, sinus pressure, sneezing, sore throat and trouble swallowing.   Eyes: Negative for photophobia and visual disturbance.  Respiratory: Negative for cough, chest tightness, shortness of breath, wheezing and stridor.   Cardiovascular: Negative for chest pain, palpitations and leg swelling.  Gastrointestinal: Negative for nausea, vomiting, abdominal pain, diarrhea, constipation, blood in stool, abdominal distention and anal bleeding.  Genitourinary: Negative for dysuria, hematuria, flank pain and difficulty urinating.  Musculoskeletal: Negative for myalgias, back pain, joint swelling, arthralgias and gait problem.  Skin: Negative for color change, pallor, rash and wound.  Neurological: Negative for dizziness, tremors, weakness and light-headedness.  Hematological: Negative for adenopathy. Does not bruise/bleed easily.  Psychiatric/Behavioral: Negative for behavioral problems, confusion, sleep disturbance, dysphoric mood, decreased concentration and  agitation.       Objective:   Physical Exam  Constitutional: She is oriented to person, place, and time. She appears well-developed and well-nourished. No distress.  HENT:  Head: Normocephalic and atraumatic.  Mouth/Throat: No oropharyngeal exudate.  Eyes: Conjunctivae and EOM are normal. No scleral icterus.  Neck: Normal range of motion. Neck supple.  Cardiovascular: Normal rate and regular rhythm.   Pulmonary/Chest: Effort normal. No respiratory distress. She has no wheezes.  Abdominal: She exhibits no distension.  Musculoskeletal: She exhibits no edema or tenderness.  Neurological: She is alert and oriented to person, place, and time. She exhibits normal muscle tone. Coordination normal.  Skin: Skin is warm and dry. No rash noted. She is not diaphoretic. No erythema. No pallor.  Psychiatric: She has a normal mood and affect. Her behavior is normal. Judgment and thought content normal.          Assessment & Plan:   HIV: Continue GENVOYA.    Hypertension :  Better controlled Filed Vitals:   07/03/15  1033  BP: 114/75  Pulse: 87  Temp: 98.2 F (36.8 C)   Smoking: she is cutting down further  CV risk: she was not eligible for REPRIEVE so will start her on statin  I spent greater than 25 minutes with the patient including greater than 50% of time in face to face counsel of the patient's HIV infection blood pressure, smoking hyperlipidemia and in coordination of her care.

## 2015-07-03 NOTE — Telephone Encounter (Signed)
Sent to the different pharmacy.

## 2015-07-04 LAB — RPR

## 2015-07-04 LAB — T-HELPER CELL (CD4) - (RCID CLINIC ONLY)
CD4 T CELL ABS: 520 /uL (ref 400–2700)
CD4 T CELL HELPER: 17 % — AB (ref 33–55)

## 2015-07-04 LAB — URINE CYTOLOGY ANCILLARY ONLY
CHLAMYDIA, DNA PROBE: NEGATIVE
NEISSERIA GONORRHEA: NEGATIVE

## 2015-07-06 LAB — HIV RNA, RTPCR W/R GT (RTI, PI,INT): HIV-1 RNA Quant, Log: <1.3 Log copies/mL

## 2015-07-07 ENCOUNTER — Ambulatory Visit: Payer: Self-pay | Admitting: Family Medicine

## 2015-07-20 ENCOUNTER — Ambulatory Visit: Payer: Self-pay

## 2015-07-20 MED FILL — sulfaSALAzine 500 MG TABS: 500 | 15 days supply | Qty: 60 | Fill #2

## 2015-07-27 ENCOUNTER — Other Ambulatory Visit: Payer: Self-pay | Admitting: Family Medicine

## 2015-08-04 ENCOUNTER — Other Ambulatory Visit: Payer: Self-pay | Admitting: Family Medicine

## 2015-08-04 ENCOUNTER — Ambulatory Visit: Payer: Self-pay | Admitting: Family Medicine

## 2015-08-04 MED FILL — sulfaSALAzine 500 MG TABS: 500 | 15 days supply | Qty: 60 | Fill #3

## 2015-08-04 MED FILL — ATORVASTATIN 10 MG TABLET: 10 | 30 days supply | Qty: 30 | Fill #1

## 2015-08-16 ENCOUNTER — Other Ambulatory Visit: Payer: Self-pay | Admitting: Family Medicine

## 2015-08-16 MED FILL — PANTOPRAZOLE SOD DR 20 MG T: 20 | 30 days supply | Qty: 60 | Fill #0

## 2015-08-16 MED FILL — HYOSCYAMINE SULF 0.125 MG T: 0.125 | 30 days supply | Qty: 60 | Fill #1

## 2015-08-28 MED FILL — FERROUS SULFATE 325 MG TAB: 325 (65 FE) | 90 days supply | Qty: 90 | Fill #1

## 2015-08-28 MED FILL — sulfaSALAzine 500 MG TABS: 500 | 15 days supply | Qty: 60 | Fill #4

## 2015-08-29 ENCOUNTER — Ambulatory Visit: Payer: Self-pay | Attending: Family Medicine | Admitting: Family Medicine

## 2015-08-29 ENCOUNTER — Encounter: Payer: Self-pay | Admitting: Family Medicine

## 2015-08-29 VITALS — BP 119/75 | HR 78 | Temp 98.1°F | Resp 16 | Ht 68.0 in | Wt 126.0 lb

## 2015-08-29 DIAGNOSIS — I10 Essential (primary) hypertension: Secondary | ICD-10-CM | POA: Insufficient documentation

## 2015-08-29 DIAGNOSIS — F17210 Nicotine dependence, cigarettes, uncomplicated: Secondary | ICD-10-CM | POA: Insufficient documentation

## 2015-08-29 DIAGNOSIS — B20 Human immunodeficiency virus [HIV] disease: Secondary | ICD-10-CM | POA: Insufficient documentation

## 2015-08-29 DIAGNOSIS — Z79899 Other long term (current) drug therapy: Secondary | ICD-10-CM | POA: Insufficient documentation

## 2015-08-29 DIAGNOSIS — K51918 Ulcerative colitis, unspecified with other complication: Secondary | ICD-10-CM

## 2015-08-29 DIAGNOSIS — R636 Underweight: Secondary | ICD-10-CM | POA: Insufficient documentation

## 2015-08-29 DIAGNOSIS — K5190 Ulcerative colitis, unspecified, without complications: Secondary | ICD-10-CM | POA: Insufficient documentation

## 2015-08-29 DIAGNOSIS — H6122 Impacted cerumen, left ear: Secondary | ICD-10-CM

## 2015-08-29 DIAGNOSIS — J069 Acute upper respiratory infection, unspecified: Secondary | ICD-10-CM | POA: Insufficient documentation

## 2015-08-29 MED ORDER — AMLODIPINE BESYLATE 5 MG PO TABS: 5.0000 mg | ORAL_TABLET | Freq: Every day | ORAL | Status: DC

## 2015-08-29 MED ORDER — MIRTAZAPINE 15 MG PO TBDP: 15.0000 mg | ORAL_TABLET | Freq: Every day | ORAL | Status: DC

## 2015-08-29 MED ORDER — CARBAMIDE PEROXIDE 6.5 % OT SOLN: 5.0000 [drp] | Freq: Two times a day (BID) | OTIC | Status: DC

## 2015-08-29 MED ORDER — PANTOPRAZOLE SODIUM 20 MG PO TBEC: 20.0000 mg | DELAYED_RELEASE_TABLET | Freq: Every day | ORAL | Status: DC

## 2015-08-29 MED ORDER — HYDROCHLOROTHIAZIDE 25 MG PO TABS: 25.0000 mg | ORAL_TABLET | Freq: Every day | ORAL | Status: DC

## 2015-08-29 NOTE — Assessment & Plan Note (Signed)
Patient is no longer underweight However, will continue remeron as she is doing well and BMI is low normal

## 2015-08-29 NOTE — Patient Instructions (Addendum)
[name] [name] was seen today for follow-up.  Diagnoses and all orders for this visit:  Essential hypertension -     hydrochlorothiazide (HYDRODIURIL) 25 MG tablet; Take 1 tablet (25 mg total) by mouth daily. -     amLODipine (NORVASC) 5 MG tablet; Take 1 tablet (5 mg total) by mouth daily.  Ulcerative colitis, acute, other complication ([place]) -     pantoprazole (PROTONIX) 20 MG tablet; Take 1 tablet (20 mg total) by mouth daily.  Underweight -     mirtazapine (REMERON SOL-TAB) 15 MG disintegrating tablet; Take 1 tablet (15 mg total) by mouth at bedtime.  HIV disease ([place])  Cerumen impaction, left -     carbamide peroxide (DEBROX) 6.5 % otic solution; Place 5 drops into the left ear 2 (two) times daily. For 5 days, then return for wax irrigation, nurse visit   meds refilled F/u in 3 months for check up  Dr. Adrian Blackwater

## 2015-08-29 NOTE — Progress Notes (Signed)
Subjective:  Patient ID: [name] [name], female    DOB: 02-17-1961  Age: 55 y.o. MRN: [id number]  CC: Follow-up   HPI [name] [name] presents for    1. URI: doing great. No cough, CP, SOB. No complaints.   Social History  Substance Use Topics  . Smoking status: Current Some Day Smoker -- 0.20 packs/day for 35 years    Types: Cigarettes  . Smokeless tobacco: Never Used     Comment: Smoking 2-3 cigs per day  . Alcohol Use: No    Outpatient Prescriptions Prior to Visit  Medication Sig Dispense Refill  . acetaminophen-codeine (TYLENOL #3) 300-30 MG tablet TAKE 1 TABLET BY MOUTH EVERY 8 HOURS AS NEEDED FOR MODERATE PAIN AND COUGH 30 tablet 0  . amLODipine (NORVASC) 10 MG tablet Take 0.5 tablets (5 mg total) by mouth daily. 30 tablet 11  . atorvastatin (LIPITOR) 10 MG tablet Take 1 tablet (10 mg total) by mouth daily. 30 tablet 11  . calcium citrate-vitamin D 500-400 MG-UNIT chewable tablet Chew 1 tablet by mouth 2 (two) times daily. 180 tablet 1  . cetirizine (ZYRTEC) 10 MG tablet Take 1 tablet (10 mg total) by mouth daily. 30 tablet 11  . elvitegravir-cobicistat-emtricitabine-tenofovir (GENVOYA) 150-150-200-10 MG TABS tablet Take 1 tablet by mouth daily with breakfast. 30 tablet 11  . feeding supplement, ENSURE COMPLETE, (ENSURE COMPLETE) LIQD Take 237 mLs by mouth 3 (three) times daily between meals. 237 mL 0  . ferrous sulfate 325 (65 FE) MG tablet TAKE 1 TABLET BY MOUTH DAILY WITH BREAKFAST 90 tablet 1  . hydrochlorothiazide (HYDRODIURIL) 25 MG tablet Take 1 tablet (25 mg total) by mouth daily. 90 tablet 3  . hyoscyamine (LEVSIN, ANASPAZ) 0.125 MG tablet TAKE 1 TABLET BY MOUTH 2 TIMES DAILY AS NEEDED FOR CRAMPING 180 tablet 1  . mirtazapine (REMERON SOL-TAB) 15 MG disintegrating tablet DISSOLVE ONE TABLET BY MOUTH AT BEDTIME 90 tablet 0  . pantoprazole (PROTONIX) 20 MG tablet TAKE 1 TABLET BY MOUTH 2 TIMES DAILY BEFORE A MEAL. 60 tablet 2  . sulfaSALAzine (AZULFIDINE) 500 MG tablet  Take 2 tablets (1,000 mg total) by mouth 2 (two) times daily. 120 tablet 5  . benzonatate (TESSALON) 100 MG capsule Take 1 capsule (100 mg total) by mouth 2 (two) times daily as needed for cough. (Patient not taking: Reported on 08/29/2015) 20 capsule 0  . sulfaSALAzine (AZULFIDINE) 500 MG tablet TAKE 2 TABLETS BY MOUTH 2 TIMES DAILY 120 tablet 2   No facility-administered medications prior to visit.    ROS Review of Systems  Constitutional: Negative for fever and chills.  Eyes: Negative for visual disturbance.  Respiratory: Negative for cough and shortness of breath.   Cardiovascular: Negative for chest pain.  Gastrointestinal: Negative for abdominal pain and blood in stool.  Musculoskeletal: Negative for back pain and arthralgias.  Skin: Negative for rash.  Allergic/Immunologic: Negative for immunocompromised state.  Hematological: Negative for adenopathy. Does not bruise/bleed easily.  Psychiatric/Behavioral: Negative for suicidal ideas and dysphoric mood.    Objective:  BP 119/75 mmHg  Pulse 78  Temp(Src) 98.1 F (36.7 C) (Oral)  Resp 16  Ht 5' 8"  (1.727 m)  Wt 126 lb (57.153 kg)  BMI 19.16 kg/m2  SpO2 100%  BP/Weight 08/29/2015 07/03/2015 06/23/107  Systolic BP 323 557 322  Diastolic BP 75 75 74  Wt. (Lbs) 126 126 119  BMI 19.16 19.16 18.1    Physical Exam  Constitutional: She is oriented to person, place, and time. She appears  well-developed and well-nourished. No distress.  HENT:  Head: Normocephalic and atraumatic.  Right Ear: Tympanic membrane, external ear and ear canal normal.  Left Ear: External ear normal.  Soft cerumen in L ear   Cardiovascular: Normal rate, regular rhythm, normal heart sounds and intact distal pulses.   Pulmonary/Chest: Effort normal and breath sounds normal.  Musculoskeletal: She exhibits no edema.  Neurological: She is alert and oriented to person, place, and time.  Skin: Skin is warm and dry. No rash noted.  Psychiatric: She has a  normal mood and affect.     Assessment & Plan:  [name] was seen today for follow-up.  Diagnoses and all orders for this visit:  Essential hypertension -     hydrochlorothiazide (HYDRODIURIL) 25 MG tablet; Take 1 tablet (25 mg total) by mouth daily. -     amLODipine (NORVASC) 5 MG tablet; Take 1 tablet (5 mg total) by mouth daily.  Ulcerative colitis, acute, other complication ([place]) -     pantoprazole (PROTONIX) 20 MG tablet; Take 1 tablet (20 mg total) by mouth daily.  Underweight -     mirtazapine (REMERON SOL-TAB) 15 MG disintegrating tablet; Take 1 tablet (15 mg total) by mouth at bedtime.  HIV disease ([place])    Meds ordered this encounter  Medications  . hydrochlorothiazide (HYDRODIURIL) 25 MG tablet    Sig: Take 1 tablet (25 mg total) by mouth daily.    Dispense:  90 tablet    Refill:  3  . amLODipine (NORVASC) 5 MG tablet    Sig: Take 1 tablet (5 mg total) by mouth daily.    Dispense:  90 tablet    Refill:  3  . pantoprazole (PROTONIX) 20 MG tablet    Sig: Take 1 tablet (20 mg total) by mouth daily.    Dispense:  90 tablet    Refill:  3  . mirtazapine (REMERON SOL-TAB) 15 MG disintegrating tablet    Sig: Take 1 tablet (15 mg total) by mouth at bedtime.    Dispense:  90 tablet    Refill:  3    Follow-up: No Follow-up on file.   Boykin Nearing MD

## 2015-08-29 NOTE — Addendum Note (Signed)
Addended by: Boykin Nearing on: 08/29/2015 12:02 PM   Modules accepted: Orders

## 2015-08-29 NOTE — Progress Notes (Signed)
F/U URI  Pt stating doing good no cough Pain scale #0 Tobacco user 1 cigarette per day  No suicidal thoughts in the past two weeks

## 2015-08-30 ENCOUNTER — Encounter: Payer: Self-pay | Admitting: Clinical

## 2015-08-30 NOTE — Progress Notes (Signed)
Depression screen [facility] 2/9 08/29/2015 07/03/2015 05/24/2015 05/03/2015 03/13/2015  Decreased Interest 0 0 0 0 0  Down, Depressed, Hopeless 0 0 0 0 0  PHQ - 2 Score 0 0 0 0 0  Altered sleeping 0 - - - -  Tired, decreased energy 1 - - - -  Change in appetite 0 - - - -  Feeling bad or failure about yourself  0 - - - -  Trouble concentrating 0 - - - -  Moving slowly or fidgety/restless 0 - - - -  Suicidal thoughts 0 - - - -  PHQ-9 Score 1 - - - -    GAD 7 : Generalized Anxiety Score 08/29/2015  Nervous, Anxious, on Edge 0  Control/stop worrying 0  Worry too much - different things 0  Trouble relaxing 0  Restless 0  Easily annoyed or irritable 0  Afraid - awful might happen 0  Total GAD 7 Score 0

## 2015-09-04 ENCOUNTER — Ambulatory Visit: Payer: Self-pay | Admitting: Infectious Disease

## 2015-09-11 ENCOUNTER — Emergency Department (HOSPITAL_BASED_OUTPATIENT_CLINIC_OR_DEPARTMENT_OTHER): Payer: Self-pay

## 2015-09-11 ENCOUNTER — Emergency Department (HOSPITAL_BASED_OUTPATIENT_CLINIC_OR_DEPARTMENT_OTHER)
Admission: EM | Admit: 2015-09-11 | Discharge: 2015-09-11 | Disposition: A | Discharge status: Home / Self Care | Payer: Self-pay | Attending: Emergency Medicine | Admitting: Emergency Medicine

## 2015-09-11 ENCOUNTER — Encounter (HOSPITAL_BASED_OUTPATIENT_CLINIC_OR_DEPARTMENT_OTHER): Payer: Self-pay | Admitting: *Deleted

## 2015-09-11 DIAGNOSIS — K219 Gastro-esophageal reflux disease without esophagitis: Secondary | ICD-10-CM | POA: Insufficient documentation

## 2015-09-11 DIAGNOSIS — M1990 Unspecified osteoarthritis, unspecified site: Secondary | ICD-10-CM | POA: Insufficient documentation

## 2015-09-11 DIAGNOSIS — J189 Pneumonia, unspecified organism: Secondary | ICD-10-CM

## 2015-09-11 DIAGNOSIS — R197 Diarrhea, unspecified: Secondary | ICD-10-CM | POA: Insufficient documentation

## 2015-09-11 DIAGNOSIS — J159 Unspecified bacterial pneumonia: Secondary | ICD-10-CM | POA: Insufficient documentation

## 2015-09-11 DIAGNOSIS — I10 Essential (primary) hypertension: Secondary | ICD-10-CM | POA: Insufficient documentation

## 2015-09-11 DIAGNOSIS — B20 Human immunodeficiency virus [HIV] disease: Secondary | ICD-10-CM | POA: Insufficient documentation

## 2015-09-11 DIAGNOSIS — F17210 Nicotine dependence, cigarettes, uncomplicated: Secondary | ICD-10-CM | POA: Insufficient documentation

## 2015-09-11 DIAGNOSIS — R109 Unspecified abdominal pain: Secondary | ICD-10-CM | POA: Insufficient documentation

## 2015-09-11 DIAGNOSIS — Z79899 Other long term (current) drug therapy: Secondary | ICD-10-CM | POA: Insufficient documentation

## 2015-09-11 LAB — URINE MICROSCOPIC-ADD ON

## 2015-09-11 LAB — CBC WITH DIFFERENTIAL/PLATELET
Basophils Absolute: 0 10*3/uL (ref 0.0–0.1)
Basophils Relative: 0 %
EOS ABS: 0 10*3/uL (ref 0.0–0.7)
EOS PCT: 0 %
HCT: 34.5 % — ABNORMAL LOW (ref 36.0–46.0)
Hemoglobin: 12.5 g/dL (ref 12.0–15.0)
LYMPHS ABS: 3 10*3/uL (ref 0.7–4.0)
LYMPHS PCT: 34 %
MCH: 31.6 pg (ref 26.0–34.0)
MCHC: 36.2 g/dL — AB (ref 30.0–36.0)
MCV: 87.3 fL (ref 78.0–100.0)
MONOS PCT: 9 %
Monocytes Absolute: 0.8 10*3/uL (ref 0.1–1.0)
Neutro Abs: 5 10*3/uL (ref 1.7–7.7)
Neutrophils Relative %: 57 %
PLATELETS: 340 10*3/uL (ref 150–400)
RBC: 3.95 MIL/uL (ref 3.87–5.11)
RDW: 13.5 % (ref 11.5–15.5)
WBC: 8.9 10*3/uL (ref 4.0–10.5)

## 2015-09-11 LAB — URINALYSIS, ROUTINE W REFLEX MICROSCOPIC
Bilirubin Urine: NEGATIVE
Glucose, UA: NEGATIVE mg/dL
Hgb urine dipstick: NEGATIVE
KETONES UR: NEGATIVE mg/dL
NITRITE: NEGATIVE
PROTEIN: 30 mg/dL — AB
Specific Gravity, Urine: 1.016 (ref 1.005–1.030)
pH: 5.5 (ref 5.0–8.0)

## 2015-09-11 LAB — COMPREHENSIVE METABOLIC PANEL
ALBUMIN: 3.7 g/dL (ref 3.5–5.0)
ALK PHOS: 623 U/L — AB (ref 38–126)
ALT: 44 U/L (ref 14–54)
ANION GAP: 9 (ref 5–15)
AST: 65 U/L — ABNORMAL HIGH (ref 15–41)
BILIRUBIN TOTAL: 0.5 mg/dL (ref 0.3–1.2)
BUN: 26 mg/dL — ABNORMAL HIGH (ref 6–20)
CALCIUM: 8.5 mg/dL — AB (ref 8.9–10.3)
CO2: 23 mmol/L (ref 22–32)
Chloride: 102 mmol/L (ref 101–111)
Creatinine, Ser: 1.15 mg/dL — ABNORMAL HIGH (ref 0.44–1.00)
GFR calc non Af Amer: 52 mL/min — ABNORMAL LOW (ref >60)
Glucose, Bld: 88 mg/dL (ref 65–99)
POTASSIUM: 3.1 mmol/L — AB (ref 3.5–5.1)
SODIUM: 134 mmol/L — AB (ref 135–145)
TOTAL PROTEIN: 8.1 g/dL (ref 6.5–8.1)

## 2015-09-11 LAB — I-STAT CG4 LACTIC ACID, ED
LACTIC ACID, VENOUS: 2.39 mmol/L — AB (ref 0.5–2.0)
LACTIC ACID, VENOUS: 0.64 mmol/L (ref 0.5–2.0)

## 2015-09-11 LAB — LIPASE, BLOOD: Lipase: 53 U/L — ABNORMAL HIGH (ref 11–51)

## 2015-09-11 MED ORDER — SODIUM CHLORIDE 0.9 % IV BOLUS (SEPSIS)
1000.0000 mL | Freq: Once | INTRAVENOUS | Status: AC
  Administered 2015-09-11: 1000 mL via INTRAVENOUS

## 2015-09-11 MED ORDER — ONDANSETRON HCL 4 MG/2ML IJ SOLN
4.0000 mg | Freq: Once | INTRAMUSCULAR | Status: AC
  Administered 2015-09-11: 4 mg via INTRAVENOUS
  Filled 2015-09-11: qty 2

## 2015-09-11 MED ORDER — AZITHROMYCIN 500 MG IV SOLR
INTRAVENOUS | Status: AC
  Filled 2015-09-11: qty 500

## 2015-09-11 MED ORDER — CEPHALEXIN 500 MG PO CAPS: 500.0000 mg | ORAL_CAPSULE | Freq: Four times a day (QID) | ORAL | Status: DC

## 2015-09-11 MED ORDER — DEXTROSE 5 % IV SOLN
1.0000 g | Freq: Once | INTRAVENOUS | Status: AC
  Administered 2015-09-11: 1 g via INTRAVENOUS
  Filled 2015-09-11: qty 10

## 2015-09-11 MED ORDER — AZITHROMYCIN 250 MG PO TABS: ORAL_TABLET | ORAL | Status: DC

## 2015-09-11 MED ORDER — ACETAMINOPHEN 325 MG PO TABS
650.0000 mg | ORAL_TABLET | Freq: Once | ORAL | Status: AC
  Administered 2015-09-11: 650 mg via ORAL
  Filled 2015-09-11: qty 2

## 2015-09-11 MED ORDER — IOPAMIDOL (ISOVUE-300) INJECTION 61%
100.0000 mL | Freq: Once | INTRAVENOUS | Status: AC | PRN
  Administered 2015-09-11: 100 mL via INTRAVENOUS

## 2015-09-11 MED ORDER — PROMETHAZINE HCL 25 MG PO TABS: 25.0000 mg | ORAL_TABLET | Freq: Four times a day (QID) | ORAL | Status: DC | PRN

## 2015-09-11 MED ORDER — MORPHINE SULFATE (PF) 4 MG/ML IV SOLN
4.0000 mg | Freq: Once | INTRAVENOUS | Status: AC
  Administered 2015-09-11: 4 mg via INTRAVENOUS
  Filled 2015-09-11: qty 1

## 2015-09-11 MED ORDER — METOCLOPRAMIDE HCL 5 MG/ML IJ SOLN
10.0000 mg | Freq: Once | INTRAMUSCULAR | Status: AC
  Administered 2015-09-11: 10 mg via INTRAVENOUS
  Filled 2015-09-11: qty 2

## 2015-09-11 MED ORDER — AZITHROMYCIN 500 MG IV SOLR
500.0000 mg | Freq: Once | INTRAVENOUS | Status: AC
  Administered 2015-09-11: 500 mg via INTRAVENOUS

## 2015-09-11 NOTE — ED Notes (Signed)
Vomiting, diarrhea and no appetite.

## 2015-09-11 NOTE — ED Provider Notes (Addendum)
CSN: [id number]     Arrival date & time 09/11/15  1216 History  By signing my name below, I, Nicole Kindred, attest that this documentation has been prepared under the direction and in the presence of Wandra Arthurs, MD.   Electronically Signed: Nicole Kindred, ED Scribe. 09/11/2015. 7:41 PM  Chief Complaint  Patient presents with  . Emesis  . Diarrhea   The history is provided by the patient. No language interpreter was used.   HPI Comments: [name] [name] is a 55 y.o. female with PMHx of HIV (CD4 520, viral load undetectable), colitis, who presents to the Emergency Department complaining of gradual onset, emesis x5 episodes per day, ongoing for two days. Pt reports associated diarrhea x 4 episodes/day, subjective fever, nausea, and mild cough. She also had one episode of epistaxis that resolved before arriving to the ED. No other associated symptoms noted. No worsening or alleviating factors noted. Pt denies recent travel, or any other pertinent symptoms. Her CD4 count is 520.  Past Medical History  Diagnosis Date  . Ulcerative colitis ([place])   . GI bleed   . GERD (gastroesophageal reflux disease) Dx 2014  . Arthritis   . HIV (human immunodeficiency virus infection) ([place]) Dx 2015  . Hypertension Dx 2014  . Substance abuse     Quit 2008  . Cavities 02/02/2015   Past Surgical History  Procedure Laterality Date  . Video bronchoscopy Bilateral 04/27/2014    Procedure: VIDEO BRONCHOSCOPY WITH FLUORO;  Surgeon: Rigoberto Noel, MD;  Location: [facility] ENDOSCOPY;  Service: Cardiopulmonary;  Laterality: Bilateral;  . Tee without cardioversion N/A 05/02/2014    Procedure: TRANSESOPHAGEAL ECHOCARDIOGRAM (TEE);  Surgeon: Larey Dresser, MD;  Location: [facility] ENDOSCOPY;  Service: Cardiovascular;  Laterality: N/A;  . Abdominal hysterectomy  2001     done in [facility], patient unsure of indication   Family History  Problem Relation Age of Onset  . Cancer Father     ?  Marland Kitchen Alcoholism Father    . Diabetes Mother   . Stroke Mother   . Pancreatitis Sister   . Diabetes Sister    Social History  Substance Use Topics  . Smoking status: Current Some Day Smoker -- 0.20 packs/day for 35 years    Types: Cigarettes  . Smokeless tobacco: Never Used     Comment: Smoking 2-3 cigs per day  . Alcohol Use: No   OB History    Gravida Para Term Preterm AB TAB SAB Ectopic Multiple Living   2 1 1  1  1   1      Review of Systems  Constitutional: Positive for fever.  Respiratory: Positive for cough.   Gastrointestinal: Positive for nausea, vomiting and diarrhea.  All other systems reviewed and are negative.   Allergies  Review of patient's allergies indicates no known allergies.  Home Medications   Prior to Admission medications   Medication Sig Start Date End Date Taking? Authorizing Provider  acetaminophen-codeine (TYLENOL #3) 300-30 MG tablet TAKE 1 TABLET BY MOUTH EVERY 8 HOURS AS NEEDED FOR MODERATE PAIN AND COUGH 06/29/15   Josalyn Funches, MD  amLODipine (NORVASC) 5 MG tablet Take 1 tablet (5 mg total) by mouth daily. 08/29/15   Josalyn Funches, MD  atorvastatin (LIPITOR) 10 MG tablet Take 1 tablet (10 mg total) by mouth daily. 07/03/15   Truman Hayward, MD  calcium citrate-vitamin D 500-400 MG-UNIT chewable tablet Chew 1 tablet by mouth 2 (two) times daily. 08/24/14   Josalyn  Funches, MD  carbamide peroxide (DEBROX) 6.5 % otic solution Place 5 drops into the left ear 2 (two) times daily. For 5 days, then return for wax irrigation, nurse visit 08/29/15   Boykin Nearing, MD  cetirizine (ZYRTEC) 10 MG tablet Take 1 tablet (10 mg total) by mouth daily. 05/24/15   Boykin Nearing, MD  elvitegravir-cobicistat-emtricitabine-tenofovir (GENVOYA) 150-150-200-10 MG TABS tablet Take 1 tablet by mouth daily with breakfast. 07/03/15   Truman Hayward, MD  feeding supplement, ENSURE COMPLETE, (ENSURE COMPLETE) LIQD Take 237 mLs by mouth 3 (three) times daily between meals. 05/03/14   Robbie Lis, MD  ferrous sulfate 325 (65 FE) MG tablet TAKE 1 TABLET BY MOUTH DAILY WITH BREAKFAST 05/23/15   Josalyn Funches, MD  hydrochlorothiazide (HYDRODIURIL) 25 MG tablet Take 1 tablet (25 mg total) by mouth daily. 08/29/15   Josalyn Funches, MD  hyoscyamine (LEVSIN, ANASPAZ) 0.125 MG tablet TAKE 1 TABLET BY MOUTH 2 TIMES DAILY AS NEEDED FOR CRAMPING 06/29/15   Boykin Nearing, MD  mirtazapine (REMERON SOL-TAB) 15 MG disintegrating tablet Take 1 tablet (15 mg total) by mouth at bedtime. 08/29/15   Josalyn Funches, MD  pantoprazole (PROTONIX) 20 MG tablet Take 1 tablet (20 mg total) by mouth daily. 08/29/15   Josalyn Funches, MD  sulfaSALAzine (AZULFIDINE) 500 MG tablet Take 2 tablets (1,000 mg total) by mouth 2 (two) times daily. 11/18/14   Josalyn Funches, MD   BP 104/73 mmHg  Pulse 82  Temp(Src) 99 F (37.2 C) (Oral)  Resp 18  Ht 5' 8.5" (1.74 m)  Wt 126 lb (57.153 kg)  BMI 18.88 kg/m2  SpO2 96% Physical Exam  Constitutional: She is oriented to person, place, and time. She appears well-developed and well-nourished. No distress.  Thin appearing and dehydrated.   HENT:  Head: Normocephalic and atraumatic.  Mucous membranes dry. Dry blood L nostril, no active bleeding   Eyes: EOM are normal.  Neck: Normal range of motion.  Cardiovascular: Normal rate, regular rhythm and normal heart sounds.   Pulmonary/Chest: Effort normal.  Diminished bilateral bases   Abdominal: Soft. She exhibits no distension. There is tenderness. There is no rebound and no guarding.  Left sided abdominal TTP.   Musculoskeletal: Normal range of motion.  Neurological: She is alert and oriented to person, place, and time.  Skin: Skin is warm and dry.  Psychiatric: She has a normal mood and affect. Judgment normal.  Nursing note and vitals reviewed.   ED Course  Procedures (including critical care time) DIAGNOSTIC STUDIES: Oxygen Saturation is 96% on RA, adequate by my interpretation.    COORDINATION OF  CARE: 3:15 PM-Discussed treatment plan which includes CT abdomen pelvis with contrast, blood culture, CBC with differential, lipase, urinalysis, and CXR with pt at bedside and pt agreed to plan.   Labs Review Labs Reviewed  URINALYSIS, ROUTINE W REFLEX MICROSCOPIC (NOT AT [facility]) - Abnormal; Notable for the following:    APPearance CLOUDY (*)    Protein, ur 30 (*)    Leukocytes, UA SMALL (*)    All other components within normal limits  URINE MICROSCOPIC-ADD ON - Abnormal; Notable for the following:    Squamous Epithelial / LPF 0-5 (*)    Bacteria, UA FEW (*)    Casts HYALINE CASTS (*)    All other components within normal limits  CBC WITH DIFFERENTIAL/PLATELET - Abnormal; Notable for the following:    HCT 34.5 (*)    MCHC 36.2 (*)    All other components  within normal limits  COMPREHENSIVE METABOLIC PANEL - Abnormal; Notable for the following:    Sodium 134 (*)    Potassium 3.1 (*)    BUN 26 (*)    Creatinine, Ser 1.15 (*)    Calcium 8.5 (*)    AST 65 (*)    Alkaline Phosphatase 623 (*)    GFR calc non Af Amer 52 (*)    All other components within normal limits  LIPASE, BLOOD - Abnormal; Notable for the following:    Lipase 53 (*)    All other components within normal limits  I-STAT CG4 LACTIC ACID, ED - Abnormal; Notable for the following:    Lactic Acid, Venous 2.39 (*)    All other components within normal limits  CULTURE, BLOOD (ROUTINE X 2)  CULTURE, BLOOD (ROUTINE X 2)  I-STAT CG4 LACTIC ACID, ED    Imaging Review Dg Chest 2 View  09/11/2015  CLINICAL DATA:  Cough and fever. EXAM: CHEST  2 VIEW COMPARISON:  05/10/2014 chest radiograph. FINDINGS: Stable cardiomediastinal silhouette with normal heart size. No pneumothorax. No pleural effusion. No pulmonary edema. Mild hazy opacity in the left lower lobe. Curvilinear opacities in the parahilar right upper lung appear decreased. IMPRESSION: 1. Mild hazy left lower lobe opacities, which could represent a mild/developing left  lower lobe pneumonia. Recommend follow-up PA and lateral post treatment chest radiographs in 4-6 weeks. 2. Curvilinear parahilar right upper lung opacities, decreased from 05/10/2014, favor mild scarring. Electronically Signed   By: Ilona Sorrel M.D.   On: 09/11/2015 17:30   Ct Abdomen Pelvis W Contrast  09/11/2015  CLINICAL DATA:  Lower abdominal pain, nausea, vomiting and diarrhea. EXAM: CT ABDOMEN AND PELVIS WITH CONTRAST TECHNIQUE: Multidetector CT imaging of the abdomen and pelvis was performed using the standard protocol following bolus administration of intravenous contrast. CONTRAST:  177m ISOVUE-300 IOPAMIDOL (ISOVUE-300) INJECTION 61% COMPARISON:  CT scan of May 10, 2014. FINDINGS: Soft tissue density is noted in several enlarged bronchi posteriorly in the left lower lobe concerning for mucous plugging or aspiration and bronchiectasis. Mild surrounding inflammation is noted. No significant osseous abnormality is noted. No gallstones are noted. The liver, spleen and pancreas appear normal. Adrenal glands and kidneys appear normal. No hydronephrosis or renal obstruction is noted. Atherosclerosis of abdominal aorta is noted without aneurysm formation. There is no evidence of bowel obstruction. The appendix appears normal. Urinary bladder appears normal. Status post hysterectomy. No significant adenopathy is noted. IMPRESSION: Atherosclerosis of abdominal aorta without aneurysm formation. Soft tissue density seen within several enlarged bronchi posteriorly in the left lower lobe concerning for mucous plugging or aspiration with associated bronchiectasis and surrounding inflammation. No acute abnormality seen in the abdomen or pelvis. Electronically Signed   By: JMarijo Conception M.D.   On: 09/11/2015 17:25   I have personally reviewed and evaluated these images and lab results as part of my medical decision-making.   EKG Interpretation None      MDM   Final diagnoses:  None   GDinara Lupu is a 55y.o. female hx of HIV (well managed) here with vomiting, diarrhea. Given hx of HIV, will consider colitis vs HIV enteritis vs gastro. Also consider aspiration. Will get labs, cultures, CXR, CT ab/pel.    6pm CT showed no colitis. Has L lower lobe pneumonia. Lactate 2.6. Will recheck after bolus. Will give ceftriaxone/azithro.   7:44 PM WBC nl. Repeat lactate nl now. Loaded with rocephin and azithro. Will dc home with keflex/azithro to cover  for possible aspiration pneumonia and atypical organisms. Tolerated PO contrast and PO fluids. Will have her get close outpatient follow up. Never hypoxic. Vitals stable.   I personally performed the services described in this documentation, which was scribed in my presence. The recorded information has been reviewed and is accurate.    Wandra Arthurs, MD 09/11/15 1946  Wandra Arthurs, MD 09/11/15 1946

## 2015-09-11 NOTE — Discharge Instructions (Signed)
Stay hydrated.   Take phenergan for nausea.  Take keflex four times daily for a week.   Take zpack as prescribed.   See your doctor this week for follow up  Return to ER if you have worse vomiting, abdominal pain, fevers, cough, trouble breathing, shortness of breath.

## 2015-09-15 MED FILL — ATORVASTATIN 10 MG TABLET: 10 | 30 days supply | Qty: 30 | Fill #2

## 2015-09-15 MED FILL — PANTOPRAZOLE SOD DR 20 MG T: 20 | 30 days supply | Qty: 60 | Fill #1

## 2015-09-15 MED FILL — sulfaSALAzine 500 MG TABS: 500 | 15 days supply | Qty: 60 | Fill #5

## 2015-09-16 LAB — CULTURE, BLOOD (ROUTINE X 2)
CULTURE: NO GROWTH
Culture: NO GROWTH

## 2015-09-22 ENCOUNTER — Encounter: Payer: Self-pay | Admitting: Family Medicine

## 2015-09-22 ENCOUNTER — Ambulatory Visit: Payer: Self-pay | Attending: Family Medicine | Admitting: Family Medicine

## 2015-09-22 VITALS — BP 120/74 | HR 78 | Temp 98.6°F | Resp 16 | Ht 68.0 in | Wt 122.0 lb

## 2015-09-22 DIAGNOSIS — Z79899 Other long term (current) drug therapy: Secondary | ICD-10-CM | POA: Insufficient documentation

## 2015-09-22 DIAGNOSIS — Z0000 Encounter for general adult medical examination without abnormal findings: Secondary | ICD-10-CM

## 2015-09-22 DIAGNOSIS — Z1231 Encounter for screening mammogram for malignant neoplasm of breast: Secondary | ICD-10-CM

## 2015-09-22 DIAGNOSIS — Z23 Encounter for immunization: Secondary | ICD-10-CM

## 2015-09-22 DIAGNOSIS — F17210 Nicotine dependence, cigarettes, uncomplicated: Secondary | ICD-10-CM | POA: Insufficient documentation

## 2015-09-22 DIAGNOSIS — J189 Pneumonia, unspecified organism: Secondary | ICD-10-CM | POA: Insufficient documentation

## 2015-09-22 DIAGNOSIS — B20 Human immunodeficiency virus [HIV] disease: Secondary | ICD-10-CM | POA: Insufficient documentation

## 2015-09-22 MED FILL — HYOSCYAMINE SULF 0.125 MG T: 0.125 | 30 days supply | Qty: 60 | Fill #2

## 2015-09-22 NOTE — Progress Notes (Signed)
Subjective:  Patient ID: [name] [name], female    DOB: 01-06-1961  Age: 55 y.o. MRN: [id number]  CC: Hospitalization Follow-up   HPI [name] [name] has HIV she presents for CAP    1. CAP: she had cough and SOB which took her to the ED on 09/11/2015. She has nearly completed the Z-pac. She is nearly done with keflex.  No CP or SOB. No fever. Still with slight cough. Cough is non-productive.   Social History  Substance Use Topics  . Smoking status: Current Some Day Smoker -- 0.20 packs/day for 35 years    Types: Cigarettes  . Smokeless tobacco: Never Used     Comment: Smoking 2-3 cigs per day  . Alcohol Use: No   Outpatient Prescriptions Prior to Visit  Medication Sig Dispense Refill  . acetaminophen-codeine (TYLENOL #3) 300-30 MG tablet TAKE 1 TABLET BY MOUTH EVERY 8 HOURS AS NEEDED FOR MODERATE PAIN AND COUGH 30 tablet 0  . amLODipine (NORVASC) 5 MG tablet Take 1 tablet (5 mg total) by mouth daily. 90 tablet 3  . atorvastatin (LIPITOR) 10 MG tablet Take 1 tablet (10 mg total) by mouth daily. 30 tablet 11  . azithromycin (ZITHROMAX Z-PAK) 250 MG tablet 2 po day one, then 1 daily x 4 days 6 tablet 0  . calcium citrate-vitamin D 500-400 MG-UNIT chewable tablet Chew 1 tablet by mouth 2 (two) times daily. 180 tablet 1  . cephALEXin (KEFLEX) 500 MG capsule Take 1 capsule (500 mg total) by mouth 4 (four) times daily. 30 capsule 0  . cetirizine (ZYRTEC) 10 MG tablet Take 1 tablet (10 mg total) by mouth daily. 30 tablet 11  . elvitegravir-cobicistat-emtricitabine-tenofovir (GENVOYA) 150-150-200-10 MG TABS tablet Take 1 tablet by mouth daily with breakfast. 30 tablet 11  . feeding supplement, ENSURE COMPLETE, (ENSURE COMPLETE) LIQD Take 237 mLs by mouth 3 (three) times daily between meals. 237 mL 0  . ferrous sulfate 325 (65 FE) MG tablet TAKE 1 TABLET BY MOUTH DAILY WITH BREAKFAST 90 tablet 1  . hydrochlorothiazide (HYDRODIURIL) 25 MG tablet Take 1 tablet (25 mg total) by mouth daily. 90 tablet 3   . hyoscyamine (LEVSIN, ANASPAZ) 0.125 MG tablet TAKE 1 TABLET BY MOUTH 2 TIMES DAILY AS NEEDED FOR CRAMPING 180 tablet 1  . mirtazapine (REMERON SOL-TAB) 15 MG disintegrating tablet Take 1 tablet (15 mg total) by mouth at bedtime. 90 tablet 3  . pantoprazole (PROTONIX) 20 MG tablet Take 1 tablet (20 mg total) by mouth daily. 90 tablet 3  . promethazine (PHENERGAN) 25 MG tablet Take 1 tablet (25 mg total) by mouth every 6 (six) hours as needed for nausea or vomiting. 15 tablet 0  . sulfaSALAzine (AZULFIDINE) 500 MG tablet Take 2 tablets (1,000 mg total) by mouth 2 (two) times daily. 120 tablet 5  . carbamide peroxide (DEBROX) 6.5 % otic solution Place 5 drops into the left ear 2 (two) times daily. For 5 days, then return for wax irrigation, nurse visit (Patient not taking: Reported on 09/22/2015) 15 mL 0   No facility-administered medications prior to visit.    ROS Review of Systems  Constitutional: Negative for fever and chills.  Eyes: Negative for visual disturbance.  Respiratory: Negative for cough and shortness of breath.   Cardiovascular: Negative for chest pain.  Gastrointestinal: Negative for abdominal pain and blood in stool.  Musculoskeletal: Negative for back pain and arthralgias.  Skin: Negative for rash.  Allergic/Immunologic: Negative for immunocompromised state.  Hematological: Negative for adenopathy. Does not  bruise/bleed easily.  Psychiatric/Behavioral: Negative for suicidal ideas and dysphoric mood.    Objective:  BP 120/74 mmHg  Pulse 78  Temp(Src) 98.6 F (37 C) (Oral)  Resp 16  Ht 5' 8"  (1.727 m)  Wt 122 lb (55.339 kg)  BMI 18.55 kg/m2  SpO2 100%  BP/Weight 09/22/2015 09/11/2015 12/01/735  Systolic BP 106 269 485  Diastolic BP 74 62 75  Wt. (Lbs) 122 126 126  BMI 18.55 18.88 19.16    Physical Exam  Constitutional: She is oriented to person, place, and time. She appears well-developed and well-nourished. No distress.  HENT:  Head: Normocephalic and  atraumatic.  Left Ear: External ear normal.  Cardiovascular: Normal rate, regular rhythm, normal heart sounds and intact distal pulses.   Pulmonary/Chest: Effort normal. No respiratory distress. She has wheezes. She has no rales. She exhibits no tenderness.  Musculoskeletal: She exhibits no edema.  Neurological: She is alert and oriented to person, place, and time.  Skin: Skin is warm and dry. No rash noted.  Psychiatric: She has a normal mood and affect.    Assessment & Plan:   There are no diagnoses linked to this encounter. Saniyyah was seen today for hospitalization follow-up.  Diagnoses and all orders for this visit:  CAP (community acquired pneumonia) -     DG Chest 2 View; Standing -     DG Chest 2 View  Visit for screening mammogram -     MM DIGITAL SCREENING BILATERAL; Future  Healthcare maintenance -     Ambulatory referral to Gastroenterology  Other orders -     Tdap vaccine greater than or equal to 7yo IM   No orders of the defined types were placed in this encounter.    Follow-up: No Follow-up on file.   Boykin Nearing MD

## 2015-09-22 NOTE — Progress Notes (Signed)
HFU pneumonia  Pt doing good  No pain today  Tobacco user- no cigarette x 1 wk  No suicidal thoughts

## 2015-09-22 NOTE — Patient Instructions (Addendum)
  [name] [name] was seen today for hospitalization follow-up.  Diagnoses and all orders for this visit:  CAP (community acquired pneumonia)  Visit for screening mammogram -     MM DIGITAL SCREENING BILATERAL; Future  Healthcare maintenance -     Ambulatory referral to Gastroenterology   You are doing well now. Please go for repeat CXR in 4-6 weeks.  Continue to not smoke.  Smoking cessation support: smoking cessation hotline: 1-800-QUIT-NOW.  Smoking cessation classes are available through [facility]. Call [phone] or visit our website at [email or web address]  You will be called with mammogram and GI appointments   F/u in  3 months for hypertension   Dr. Adrian Blackwater

## 2015-09-25 ENCOUNTER — Encounter: Payer: Self-pay | Admitting: Family Medicine

## 2015-09-25 NOTE — Assessment & Plan Note (Signed)
Resolving F/u CXR ordered

## 2015-09-27 ENCOUNTER — Telehealth: Payer: Self-pay

## 2015-09-27 NOTE — Telephone Encounter (Signed)
Patient states she called for refill of Genvoya and was told her ADAP was not valid.  She came in February for re-certification.    I spoke with Sharyn Lull, [facility]) and there was a discrepancy with her date of birth. Sharyn Lull corrected the document and returned to ADAP. Sharyn Lull is out of the office today but will check with ADAP on Thursday to see how long this may delayed and will submit [employer] application if needed.    Sharyn Lull will contact patient.    Laverle Patter, RN

## 2015-10-09 ENCOUNTER — Ambulatory Visit: Payer: Self-pay | Admitting: Infectious Disease

## 2015-10-12 ENCOUNTER — Ambulatory Visit: Payer: Self-pay

## 2015-10-17 MED FILL — sulfaSALAzine 500 MG TABS: 500 | 30 days supply | Qty: 120 | Fill #0

## 2015-10-17 MED FILL — ATORVASTATIN 10 MG TABLET: 10 | 30 days supply | Qty: 30 | Fill #3

## 2015-10-24 ENCOUNTER — Encounter: Payer: Self-pay | Admitting: Infectious Disease

## 2015-11-16 MED FILL — sulfaSALAzine 500 MG TABS: 500 | 30 days supply | Qty: 120 | Fill #1

## 2015-11-16 MED FILL — PANTOPRAZOLE SOD DR 20 MG T: 20 | 30 days supply | Qty: 60 | Fill #2

## 2015-11-16 MED FILL — ?ATORVASTATIN 10 MG TABLET: 10 | 30 days supply | Qty: 30 | Fill #4

## 2015-11-16 MED FILL — HYOSCYAMINE SULF 0.125 MG T: 0.125 | 30 days supply | Qty: 60 | Fill #3

## 2015-12-04 ENCOUNTER — Ambulatory Visit: Payer: Self-pay | Admitting: Infectious Disease

## 2015-12-25 MED FILL — PANTOPRAZOLE SOD DR 20 MG T: 20 | 30 days supply | Qty: 60 | Fill #3

## 2015-12-25 MED FILL — FERROUS SULFATE 325 MG TAB: 325 (65 FE) | 90 days supply | Qty: 90 | Fill #2

## 2015-12-25 MED FILL — ?ATORVASTATIN 10 MG TABLET: 10 | 30 days supply | Qty: 30 | Fill #5

## 2015-12-26 ENCOUNTER — Encounter: Payer: Self-pay | Admitting: Infectious Disease

## 2015-12-26 ENCOUNTER — Ambulatory Visit (INDEPENDENT_AMBULATORY_CARE_PROVIDER_SITE_OTHER): Payer: Self-pay | Admitting: Infectious Disease

## 2015-12-26 VITALS — BP 123/76 | HR 59 | Temp 97.9°F | Wt 121.0 lb

## 2015-12-26 DIAGNOSIS — K5190 Ulcerative colitis, unspecified, without complications: Secondary | ICD-10-CM

## 2015-12-26 DIAGNOSIS — B20 Human immunodeficiency virus [HIV] disease: Secondary | ICD-10-CM

## 2015-12-26 DIAGNOSIS — Z113 Encounter for screening for infections with a predominantly sexual mode of transmission: Secondary | ICD-10-CM

## 2015-12-26 DIAGNOSIS — Z79899 Other long term (current) drug therapy: Secondary | ICD-10-CM

## 2015-12-26 DIAGNOSIS — I10 Essential (primary) hypertension: Secondary | ICD-10-CM

## 2015-12-26 LAB — CBC WITH DIFFERENTIAL/PLATELET
Basophils Absolute: 0 cells/uL (ref 0–200)
Basophils Relative: 0 %
EOS ABS: 322 {cells}/uL (ref 15–500)
Eosinophils Relative: 7 %
HEMATOCRIT: 34.6 % — AB (ref 35.0–45.0)
Hemoglobin: 11.5 g/dL — ABNORMAL LOW (ref 11.7–15.5)
LYMPHS PCT: 54 %
Lymphs Abs: 2484 cells/uL (ref 850–3900)
MCH: 31.3 pg (ref 27.0–33.0)
MCHC: 33.2 g/dL (ref 32.0–36.0)
MCV: 94.3 fL (ref 80.0–100.0)
MONO ABS: 552 {cells}/uL (ref 200–950)
MONOS PCT: 12 %
MPV: 9.7 fL (ref 7.5–12.5)
NEUTROS PCT: 27 %
Neutro Abs: 1242 cells/uL — ABNORMAL LOW (ref 1500–7800)
Platelets: 380 10*3/uL (ref 140–400)
RBC: 3.67 MIL/uL — ABNORMAL LOW (ref 3.80–5.10)
RDW: 14.5 % (ref 11.0–15.0)
WBC: 4.6 10*3/uL (ref 3.8–10.8)

## 2015-12-26 NOTE — Addendum Note (Signed)
Addended by: Dolan Amen D on: 12/26/2015 05:36 PM   Modules accepted: Orders

## 2015-12-26 NOTE — Progress Notes (Signed)
Chief complaint: Follow-up for HIV on medications Subjective:    Patient ID: [name] [name], female    DOB: Oct 30, 1960, 55 y.o.   MRN: [id number]  HPI   This is a 55 year-old African-American lady with HIV, ulcerative colitis hypertension on GENVOYA for HIV and doing perfectly though no labs since January.  Lab Results  Component Value Date   HIV1RNAQUANT <20 07/03/2015   HIV1RNAQUANT <20 02/02/2015   HIV1RNAQUANT 65784* 10/26/2014    Lab Results  Component Value Date   CD4TABS 520 07/03/2015   CD4TABS 350* 02/02/2015   CD4TABS 360* 10/26/2014   Past Medical History  Diagnosis Date  . Ulcerative colitis ([place])   . GI bleed   . GERD (gastroesophageal reflux disease) Dx 2014  . Arthritis   . HIV (human immunodeficiency virus infection) ([place]) Dx 2015  . Hypertension Dx 2014  . Substance abuse     Quit 2008  . Cavities 02/02/2015    Past Surgical History  Procedure Laterality Date  . Video bronchoscopy Bilateral 04/27/2014    Procedure: VIDEO BRONCHOSCOPY WITH FLUORO;  Surgeon: Rigoberto Noel, MD;  Location: [facility] ENDOSCOPY;  Service: Cardiopulmonary;  Laterality: Bilateral;  . Tee without cardioversion N/A 05/02/2014    Procedure: TRANSESOPHAGEAL ECHOCARDIOGRAM (TEE);  Surgeon: Larey Dresser, MD;  Location: [facility] ENDOSCOPY;  Service: Cardiovascular;  Laterality: N/A;  . Abdominal hysterectomy  2001     done in [facility], patient unsure of indication    Family History  Problem Relation Age of Onset  . Cancer Father     ?  Marland Kitchen Alcoholism Father   . Diabetes Mother   . Stroke Mother   . Pancreatitis Sister   . Diabetes Sister       Social History   Social History  . Marital Status: Single    Spouse Name: N/A  . Number of Children: N/A  . Years of Education: N/A   Social History Main Topics  . Smoking status: Current Some Day Smoker -- 0.20 packs/day for 35 years    Types: Cigarettes  . Smokeless tobacco: Never Used     Comment: Smoking 2-3 cigs per day    . Alcohol Use: No  . Drug Use: No     Comment: no drugs per patient since 2009  . Sexual Activity: Yes    Birth Control/ Protection: Other-see comments, Surgical     Comment: pt. given condoms   Other Topics Concern  . None   Social History Narrative    No Known Allergies   Current outpatient prescriptions:  .  acetaminophen-codeine (TYLENOL #3) 300-30 MG tablet, TAKE 1 TABLET BY MOUTH EVERY 8 HOURS AS NEEDED FOR MODERATE PAIN AND COUGH, Disp: 30 tablet, Rfl: 0 .  amLODipine (NORVASC) 5 MG tablet, Take 1 tablet (5 mg total) by mouth daily., Disp: 90 tablet, Rfl: 3 .  atorvastatin (LIPITOR) 10 MG tablet, Take 1 tablet (10 mg total) by mouth daily., Disp: 30 tablet, Rfl: 11 .  calcium citrate-vitamin D 500-400 MG-UNIT chewable tablet, Chew 1 tablet by mouth 2 (two) times daily., Disp: 180 tablet, Rfl: 1 .  cephALEXin (KEFLEX) 500 MG capsule, Take 1 capsule (500 mg total) by mouth 4 (four) times daily., Disp: 30 capsule, Rfl: 0 .  cetirizine (ZYRTEC) 10 MG tablet, Take 1 tablet (10 mg total) by mouth daily., Disp: 30 tablet, Rfl: 11 .  elvitegravir-cobicistat-emtricitabine-tenofovir (GENVOYA) 150-150-200-10 MG TABS tablet, Take 1 tablet by mouth daily with breakfast., Disp: 30 tablet, Rfl: 11 .  feeding supplement, ENSURE COMPLETE, (ENSURE COMPLETE) LIQD, Take 237 mLs by mouth 3 (three) times daily between meals., Disp: 237 mL, Rfl: 0 .  ferrous sulfate 325 (65 FE) MG tablet, TAKE 1 TABLET BY MOUTH DAILY WITH BREAKFAST, Disp: 90 tablet, Rfl: 1 .  hydrochlorothiazide (HYDRODIURIL) 25 MG tablet, Take 1 tablet (25 mg total) by mouth daily., Disp: 90 tablet, Rfl: 3 .  hyoscyamine (LEVSIN, ANASPAZ) 0.125 MG tablet, TAKE 1 TABLET BY MOUTH 2 TIMES DAILY AS NEEDED FOR CRAMPING, Disp: 180 tablet, Rfl: 1 .  mirtazapine (REMERON SOL-TAB) 15 MG disintegrating tablet, Take 1 tablet (15 mg total) by mouth at bedtime., Disp: 90 tablet, Rfl: 3 .  pantoprazole (PROTONIX) 20 MG tablet, Take 1 tablet (20 mg  total) by mouth daily., Disp: 90 tablet, Rfl: 3 .  promethazine (PHENERGAN) 25 MG tablet, Take 1 tablet (25 mg total) by mouth every 6 (six) hours as needed for nausea or vomiting., Disp: 15 tablet, Rfl: 0 .  sulfaSALAzine (AZULFIDINE) 500 MG tablet, Take 2 tablets (1,000 mg total) by mouth 2 (two) times daily., Disp: 120 tablet, Rfl: 5 .  carbamide peroxide (DEBROX) 6.5 % otic solution, Place 5 drops into the left ear 2 (two) times daily. For 5 days, then return for wax irrigation, nurse visit (Patient not taking: Reported on 12/26/2015), Disp: 15 mL, Rfl: 0   Review of Systems  Constitutional: Negative for fever, chills, diaphoresis, activity change, appetite change, fatigue and unexpected weight change.  HENT: Negative for congestion, rhinorrhea, sinus pressure, sneezing, sore throat and trouble swallowing.   Eyes: Negative for photophobia and visual disturbance.  Respiratory: Negative for cough, chest tightness, shortness of breath, wheezing and stridor.   Cardiovascular: Negative for chest pain, palpitations and leg swelling.  Gastrointestinal: Negative for nausea, vomiting, abdominal pain, diarrhea, constipation, blood in stool, abdominal distention and anal bleeding.  Genitourinary: Negative for dysuria, hematuria, flank pain and difficulty urinating.  Musculoskeletal: Negative for myalgias, back pain, joint swelling, arthralgias and gait problem.  Skin: Negative for color change, pallor, rash and wound.  Neurological: Negative for dizziness, tremors, weakness and light-headedness.  Hematological: Negative for adenopathy. Does not bruise/bleed easily.  Psychiatric/Behavioral: Negative for behavioral problems, confusion, sleep disturbance, dysphoric mood, decreased concentration and agitation.       Objective:   Physical Exam  Constitutional: She is oriented to person, place, and time. She appears well-developed and well-nourished. No distress.  HENT:  Head: Normocephalic and  atraumatic.  Mouth/Throat: No oropharyngeal exudate.  Eyes: Conjunctivae and EOM are normal. No scleral icterus.  Neck: Normal range of motion. Neck supple.  Cardiovascular: Normal rate and regular rhythm.   Pulmonary/Chest: Effort normal. No respiratory distress. She has no wheezes.  Abdominal: She exhibits no distension.  Musculoskeletal: She exhibits no edema or tenderness.  Neurological: She is alert and oriented to person, place, and time. She exhibits normal muscle tone. Coordination normal.  Skin: Skin is warm and dry. No rash noted. She is not diaphoretic. No erythema. No pallor.  Psychiatric: She has a normal mood and affect. Her behavior is normal. Judgment and thought content normal.          Assessment & Plan:   HIV: Continue GENVOYA, check labs today renew ADAP rtc in 6 months and do the same but preferably labs first   Hypertension :  Better controlled Filed Vitals:   12/26/15 1351  BP: 123/76  Pulse: 59  Temp: 97.9 F (36.6 C)   Smoking: she is cutting down further  CV risk: she was not eligible for REPRIEVE so  on statin  I spent greater than 25 minutes with the patient including greater than 50% of time in face to face counsel of the patient's HIV infection blood pressure, smoking and in coordination of her care.

## 2015-12-27 LAB — LIPID PANEL
Cholesterol: 238 mg/dL — ABNORMAL HIGH (ref 125–200)
HDL: 66 mg/dL (ref >=46)
LDL CALC: 155 mg/dL — AB (ref <130)
TRIGLYCERIDES: 87 mg/dL (ref <150)
Total CHOL/HDL Ratio: 3.6 Ratio (ref <=5.0)
VLDL: 17 mg/dL (ref <30)

## 2015-12-27 LAB — COMPLETE METABOLIC PANEL WITH GFR
ALT: 71 U/L — AB (ref 6–29)
AST: 82 U/L — AB (ref 10–35)
Albumin: 4.3 g/dL (ref 3.6–5.1)
Alkaline Phosphatase: 689 U/L — ABNORMAL HIGH (ref 33–130)
BILIRUBIN TOTAL: 0.6 mg/dL (ref 0.2–1.2)
BUN: 19 mg/dL (ref 7–25)
CALCIUM: 9.6 mg/dL (ref 8.6–10.4)
CHLORIDE: 101 mmol/L (ref 98–110)
CO2: 25 mmol/L (ref 20–31)
CREATININE: 1.09 mg/dL — AB (ref 0.50–1.05)
GFR, EST AFRICAN AMERICAN: 66 mL/min (ref >=60)
GFR, Est Non African American: 57 mL/min — ABNORMAL LOW (ref >=60)
Glucose, Bld: 65 mg/dL (ref 65–99)
Potassium: 3.6 mmol/L (ref 3.5–5.3)
Sodium: 139 mmol/L (ref 135–146)
TOTAL PROTEIN: 8.9 g/dL — AB (ref 6.1–8.1)

## 2015-12-27 LAB — RPR

## 2015-12-28 LAB — HIV-1 RNA QUANT-NO REFLEX-BLD: HIV 1 RNA Quant: <20 copies/mL (ref <20)

## 2015-12-28 LAB — T-HELPER CELL (CD4) - (RCID CLINIC ONLY)
CD4 T CELL ABS: 550 /uL (ref 400–2700)
CD4 T CELL HELPER: 21 % — AB (ref 33–55)

## 2015-12-29 ENCOUNTER — Ambulatory Visit: Payer: Self-pay | Admitting: Family Medicine

## 2015-12-29 ENCOUNTER — Encounter: Payer: Self-pay | Admitting: Infectious Disease

## 2016-01-10 MED FILL — sulfaSALAzine 500 MG TABS: 500 | 30 days supply | Qty: 120 | Fill #2

## 2016-01-11 ENCOUNTER — Encounter: Payer: Self-pay | Admitting: Family Medicine

## 2016-01-11 ENCOUNTER — Ambulatory Visit: Payer: Self-pay | Attending: Family Medicine | Admitting: Family Medicine

## 2016-01-11 VITALS — BP 148/79 | HR 68 | Temp 98.4°F | Resp 17 | Ht 68.0 in | Wt 125.4 lb

## 2016-01-11 DIAGNOSIS — I10 Essential (primary) hypertension: Secondary | ICD-10-CM

## 2016-01-11 DIAGNOSIS — Z720 Tobacco use: Secondary | ICD-10-CM

## 2016-01-11 DIAGNOSIS — F17200 Nicotine dependence, unspecified, uncomplicated: Secondary | ICD-10-CM

## 2016-01-11 MED ORDER — AMLODIPINE BESYLATE 5 MG PO TABS: 5.0000 mg | ORAL_TABLET | Freq: Every day | ORAL | 3 refills | Status: DC

## 2016-01-11 NOTE — Patient Instructions (Addendum)
[name] [name] was seen today for hypertension.  Diagnoses and all orders for this visit:  Essential hypertension -     amLODipine (NORVASC) 5 MG tablet; Take 1 tablet (5 mg total) by mouth daily.   Smoking cessation support: smoking cessation hotline: 1-800-QUIT-NOW.  Smoking cessation classes are available through [facility] and [facility]. Call [phone] or visit our website at [email or web address]   F/u in 3 months for HTN   Dr. Adrian Blackwater

## 2016-01-11 NOTE — Progress Notes (Signed)
Subjective:  Patient ID: [name] [name], female    DOB: Feb 16, 1961  Age: 55 y.o. MRN: [id number]  CC: Hypertension   HPI [name] [name] has  Hx HIV and AIDs, ulcerative colitis, HTN she presents for   1.  HTN: compliant with norvasc 5 mg daily and HCTZ 25 mg daily. No HA, CP, SOB or swelling. Doing very well. She has no complaints.   2. HM: due for mammogram, limited by cost. Due for c-scope, orange card has expired. No breast pain. No blood in stool.   Family History  Problem Relation Age of Onset  . Cancer Father     ?  Marland Kitchen Alcoholism Father   . Diabetes Mother   . Stroke Mother   . Pancreatitis Sister   . Diabetes Sister   . Cancer Paternal Uncle 30    colon     Social History  Substance Use Topics  . Smoking status: Current Some Day Smoker    Packs/day: 0.20    Years: 35.00    Types: Cigarettes  . Smokeless tobacco: Never Used     Comment: Smoking 2-3 cigs per day  . Alcohol use No    Outpatient Medications Prior to Visit  Medication Sig Dispense Refill  . amLODipine (NORVASC) 5 MG tablet Take 1 tablet (5 mg total) by mouth daily. 90 tablet 3  . atorvastatin (LIPITOR) 10 MG tablet Take 1 tablet (10 mg total) by mouth daily. 30 tablet 11  . calcium citrate-vitamin D 500-400 MG-UNIT chewable tablet Chew 1 tablet by mouth 2 (two) times daily. 180 tablet 1  . cephALEXin (KEFLEX) 500 MG capsule Take 1 capsule (500 mg total) by mouth 4 (four) times daily. 30 capsule 0  . cetirizine (ZYRTEC) 10 MG tablet Take 1 tablet (10 mg total) by mouth daily. 30 tablet 11  . elvitegravir-cobicistat-emtricitabine-tenofovir (GENVOYA) 150-150-200-10 MG TABS tablet Take 1 tablet by mouth daily with breakfast. 30 tablet 11  . ferrous sulfate 325 (65 FE) MG tablet TAKE 1 TABLET BY MOUTH DAILY WITH BREAKFAST 90 tablet 1  . hydrochlorothiazide (HYDRODIURIL) 25 MG tablet Take 1 tablet (25 mg total) by mouth daily. 90 tablet 3  . hyoscyamine (LEVSIN, ANASPAZ) 0.125 MG tablet TAKE 1 TABLET BY MOUTH 2  TIMES DAILY AS NEEDED FOR CRAMPING 180 tablet 1  . mirtazapine (REMERON SOL-TAB) 15 MG disintegrating tablet Take 1 tablet (15 mg total) by mouth at bedtime. 90 tablet 3  . sulfaSALAzine (AZULFIDINE) 500 MG tablet Take 2 tablets (1,000 mg total) by mouth 2 (two) times daily. 120 tablet 5  . acetaminophen-codeine (TYLENOL #3) 300-30 MG tablet TAKE 1 TABLET BY MOUTH EVERY 8 HOURS AS NEEDED FOR MODERATE PAIN AND COUGH (Patient not taking: Reported on 01/11/2016) 30 tablet 0  . carbamide peroxide (DEBROX) 6.5 % otic solution Place 5 drops into the left ear 2 (two) times daily. For 5 days, then return for wax irrigation, nurse visit (Patient not taking: Reported on 12/26/2015) 15 mL 0  . feeding supplement, ENSURE COMPLETE, (ENSURE COMPLETE) LIQD Take 237 mLs by mouth 3 (three) times daily between meals. (Patient not taking: Reported on 01/11/2016) 237 mL 0  . pantoprazole (PROTONIX) 20 MG tablet Take 1 tablet (20 mg total) by mouth daily. (Patient not taking: Reported on 01/11/2016) 90 tablet 3  . promethazine (PHENERGAN) 25 MG tablet Take 1 tablet (25 mg total) by mouth every 6 (six) hours as needed for nausea or vomiting. (Patient not taking: Reported on 01/11/2016) 15 tablet 0  No facility-administered medications prior to visit.     ROS Review of Systems  Constitutional: Negative for chills and fever.  Eyes: Negative for visual disturbance.  Respiratory: Negative for cough and shortness of breath.   Cardiovascular: Negative for chest pain.  Gastrointestinal: Negative for abdominal pain and blood in stool.  Musculoskeletal: Negative for arthralgias and back pain.  Skin: Negative for rash.  Allergic/Immunologic: Negative for immunocompromised state.  Hematological: Negative for adenopathy. Does not bruise/bleed easily.  Psychiatric/Behavioral: Negative for dysphoric mood and suicidal ideas.    Objective:  BP (!) 148/79 (BP Location: Right Arm, Patient Position: Sitting, Cuff Size: Small)    Pulse 68   Temp 98.4 F (36.9 C) (Oral)   Resp 17   Ht 5' 8"  (1.727 m)   Wt 125 lb 6.4 oz (56.9 kg)   SpO2 100%   BMI 19.07 kg/m   BP/Weight 01/11/2016 4/69/5072 07/23/7503  Systolic BP 183 358 251  Diastolic BP 79 76 74  Wt. (Lbs) 125.4 121 122  BMI 19.07 18.4 18.55    Physical Exam  Constitutional: She is oriented to person, place, and time. She appears well-developed and well-nourished. No distress.  HENT:  Head: Normocephalic and atraumatic.  Left Ear: External ear normal.  Cardiovascular: Normal rate, regular rhythm, normal heart sounds and intact distal pulses.   Pulmonary/Chest: Effort normal. No respiratory distress. She has no wheezes. She has no rales. She exhibits no tenderness.  Musculoskeletal: She exhibits no edema.  Neurological: She is alert and oriented to person, place, and time.  Skin: Skin is warm and dry. No rash noted.  Psychiatric: She has a normal mood and affect.    Assessment & Plan:  [name] [name] was seen today for hypertension.  Diagnoses and all orders for this visit:  Current smoker  Essential hypertension -     amLODipine (NORVASC) 5 MG tablet; Take 1 tablet (5 mg total) by mouth daily.   There are no diagnoses linked to this encounter.  No orders of the defined types were placed in this encounter.   Follow-up: Return in about 3 months (around 04/12/2016) for HTN .   Boykin Nearing MD

## 2016-01-14 NOTE — Assessment & Plan Note (Signed)
A: elevated BP today Med: compliant P: Continue current regimen With plan to increase norvasc dose if BP elevated at recheck

## 2016-01-15 ENCOUNTER — Ambulatory Visit: Payer: Self-pay

## 2016-01-29 ENCOUNTER — Ambulatory Visit: Payer: Self-pay

## 2016-02-16 ENCOUNTER — Other Ambulatory Visit: Payer: Self-pay | Admitting: Family Medicine

## 2016-02-16 MED FILL — PANTOPRAZOLE SOD DR 20 MG T: 20 | 30 days supply | Qty: 60 | Fill #0

## 2016-02-16 MED FILL — ?ATORVASTATIN 10 MG TABLET: 10 | 30 days supply | Qty: 30 | Fill #6

## 2016-02-16 MED FILL — sulfaSALAzine 500 MG TABS: 500 | 30 days supply | Qty: 120 | Fill #0

## 2016-03-20 MED FILL — ?ATORVASTATIN 10 MG TABLET: 10 | 30 days supply | Qty: 30 | Fill #7

## 2016-03-20 MED FILL — PANTOPRAZOLE SOD DR 20 MG T: 20 | 30 days supply | Qty: 60 | Fill #1

## 2016-04-12 ENCOUNTER — Encounter: Payer: Self-pay | Admitting: Family Medicine

## 2016-04-12 ENCOUNTER — Telehealth: Payer: Self-pay | Admitting: Family Medicine

## 2016-04-12 ENCOUNTER — Ambulatory Visit (HOSPITAL_COMMUNITY)
Admission: RE | Admit: 2016-04-12 | Discharge: 2016-04-12 | Disposition: A | Discharge status: Home / Self Care | Payer: Self-pay | Source: Ambulatory Visit | Attending: Family Medicine | Admitting: Family Medicine

## 2016-04-12 ENCOUNTER — Ambulatory Visit: Payer: Self-pay | Attending: Family Medicine | Admitting: Family Medicine

## 2016-04-12 VITALS — BP 119/72 | HR 83 | Temp 98.5°F | Ht 68.0 in | Wt 123.6 lb

## 2016-04-12 DIAGNOSIS — R058 Other specified cough: Secondary | ICD-10-CM | POA: Insufficient documentation

## 2016-04-12 DIAGNOSIS — Z823 Family history of stroke: Secondary | ICD-10-CM | POA: Insufficient documentation

## 2016-04-12 DIAGNOSIS — R918 Other nonspecific abnormal finding of lung field: Secondary | ICD-10-CM | POA: Insufficient documentation

## 2016-04-12 DIAGNOSIS — Z811 Family history of alcohol abuse and dependence: Secondary | ICD-10-CM | POA: Insufficient documentation

## 2016-04-12 DIAGNOSIS — Z23 Encounter for immunization: Secondary | ICD-10-CM

## 2016-04-12 DIAGNOSIS — Z833 Family history of diabetes mellitus: Secondary | ICD-10-CM | POA: Insufficient documentation

## 2016-04-12 DIAGNOSIS — B20 Human immunodeficiency virus [HIV] disease: Secondary | ICD-10-CM | POA: Insufficient documentation

## 2016-04-12 DIAGNOSIS — R05 Cough: Secondary | ICD-10-CM

## 2016-04-12 DIAGNOSIS — D638 Anemia in other chronic diseases classified elsewhere: Secondary | ICD-10-CM

## 2016-04-12 DIAGNOSIS — K5190 Ulcerative colitis, unspecified, without complications: Secondary | ICD-10-CM | POA: Insufficient documentation

## 2016-04-12 DIAGNOSIS — Z79899 Other long term (current) drug therapy: Secondary | ICD-10-CM | POA: Insufficient documentation

## 2016-04-12 DIAGNOSIS — I10 Essential (primary) hypertension: Secondary | ICD-10-CM

## 2016-04-12 DIAGNOSIS — F17210 Nicotine dependence, cigarettes, uncomplicated: Secondary | ICD-10-CM | POA: Insufficient documentation

## 2016-04-12 DIAGNOSIS — K5180 Other ulcerative colitis without complications: Secondary | ICD-10-CM

## 2016-04-12 MED ORDER — HYOSCYAMINE SULFATE 0.125 MG PO TABS: 0.1250 mg | ORAL_TABLET | Freq: Four times a day (QID) | ORAL | 1 refills | Status: DC | PRN

## 2016-04-12 MED ORDER — FERROUS SULFATE 325 (65 FE) MG PO TABS: 325.0000 mg | ORAL_TABLET | Freq: Every day | ORAL | 3 refills | Status: DC

## 2016-04-12 MED FILL — HYOSCYAMINE SULF 0.125 MG T: 0.125 | 22 days supply | Qty: 90 | Fill #0

## 2016-04-12 MED FILL — FERROUS SULFATE 325 MG TAB: 325 (65 FE) | 30 days supply | Qty: 30 | Fill #0

## 2016-04-12 NOTE — Assessment & Plan Note (Signed)
UC w/o GI bleeding Refilled levsin for intermittent cramping

## 2016-04-12 NOTE — Assessment & Plan Note (Signed)
A: HTN well  Controlled Med: compliant P: Continue current regimen

## 2016-04-12 NOTE — Telephone Encounter (Signed)
Called patient She has completed the CXR ordered   Left VM informing patient that there was no evidence of pneumonia on chest x-[place]  Coarse sounds heard on exam of R lung is from previous scarring.  She is advised to call if she develops chest pain, shortness of breath or fever.

## 2016-04-12 NOTE — Assessment & Plan Note (Signed)
A: productive cough in smoker, HIV +. No fever, CP or dyspnea P: continue mucinex CXR ordered

## 2016-04-12 NOTE — Progress Notes (Signed)
Subjective:  Patient ID: [name] [name], female    DOB: [birth year]  Age: 55 y.o. MRN: [id number]  CC: Hypertension   HPI [name] [name] has  HIV (hx of AIDs), ulcerative colitis, HTN she presents with her granddaughter [name] [name] for   1.  HTN: compliant with norvasc 5 mg daily and HCTZ 25 mg daily. No HA, CP, SOB or swelling. Doing very well. She has no complaints.   2. HM: due for mammogram, limited by cost. Due for c-scope.  No breast pain. No blood in stool. She will have medicare starting next year.   3. Cough: x 1 week, productive. No chest pain or shortness of breath. No fevers. No known sick contacts. She is compliant with HAART therapy. Last CD4 550. She is smoking   Family History  Problem Relation Age of Onset  . Cancer Father     ?  Marland Kitchen Alcoholism Father   . Diabetes Mother   . Stroke Mother   . Pancreatitis Sister   . Diabetes Sister   . Cancer Paternal Uncle 53    colon     Social History  Substance Use Topics  . Smoking status: Current Some Day Smoker    Packs/day: 0.20    Years: 35.00    Types: Cigarettes  . Smokeless tobacco: Never Used     Comment: Smoking 2-3 cigs per day  . Alcohol use No    Outpatient Medications Prior to Visit  Medication Sig Dispense Refill  . amLODipine (NORVASC) 5 MG tablet Take 1 tablet (5 mg total) by mouth daily. 90 tablet 3  . atorvastatin (LIPITOR) 10 MG tablet Take 1 tablet (10 mg total) by mouth daily. 30 tablet 11  . calcium citrate-vitamin D 500-400 MG-UNIT chewable tablet Chew 1 tablet by mouth 2 (two) times daily. 180 tablet 1  . cetirizine (ZYRTEC) 10 MG tablet Take 1 tablet (10 mg total) by mouth daily. 30 tablet 11  . elvitegravir-cobicistat-emtricitabine-tenofovir (GENVOYA) 150-150-200-10 MG TABS tablet Take 1 tablet by mouth daily with breakfast. 30 tablet 11  . feeding supplement, ENSURE COMPLETE, (ENSURE COMPLETE) LIQD Take 237 mLs by mouth 3 (three) times daily between meals. 237 mL 0  . ferrous sulfate 325 (65 FE)  MG tablet TAKE 1 TABLET BY MOUTH DAILY WITH BREAKFAST 90 tablet 1  . hydrochlorothiazide (HYDRODIURIL) 25 MG tablet Take 1 tablet (25 mg total) by mouth daily. 90 tablet 3  . hyoscyamine (LEVSIN, ANASPAZ) 0.125 MG tablet TAKE 1 TABLET BY MOUTH 2 TIMES DAILY AS NEEDED FOR CRAMPING 180 tablet 1  . mirtazapine (REMERON SOL-TAB) 15 MG disintegrating tablet Take 1 tablet (15 mg total) by mouth at bedtime. 90 tablet 3  . pantoprazole (PROTONIX) 20 MG tablet TAKE 1 TABLET BY MOUTH 2 TIMES DAILY BEFORE A MEAL. 60 tablet 3  . sulfaSALAzine (AZULFIDINE) 500 MG tablet Take 2 tablets (1,000 mg total) by mouth 2 (two) times daily. 120 tablet 5  . sulfaSALAzine (AZULFIDINE) 500 MG tablet TAKE 2 TABLETS BY MOUTH 2 TIMES DAILY 120 tablet 2   No facility-administered medications prior to visit.     ROS Review of Systems  Constitutional: Negative for chills and fever.  Eyes: Negative for visual disturbance.  Respiratory: Positive for cough. Negative for shortness of breath.   Cardiovascular: Negative for chest pain.  Gastrointestinal: Negative for abdominal pain and blood in stool.  Musculoskeletal: Negative for arthralgias and back pain.  Skin: Negative for rash.  Allergic/Immunologic: Negative for immunocompromised state.  Hematological: Negative for adenopathy.  Does not bruise/bleed easily.  Psychiatric/Behavioral: Negative for dysphoric mood and suicidal ideas.    Objective:  BP 119/72 (BP Location: Right Arm, Patient Position: Sitting, Cuff Size: Small)   Pulse 83   Temp 98.5 F (36.9 C) (Oral)   Ht 5' 8"  (1.727 m)   Wt 123 lb 9.6 oz (56.1 kg)   SpO2 100%   BMI 18.79 kg/m   BP/Weight 04/12/2016 01/11/2016 1/95/0932  Systolic BP 671 245 809  Diastolic BP 72 79 76  Wt. (Lbs) 123.6 125.4 121  BMI 18.79 19.07 18.4    Physical Exam  Constitutional: She is oriented to person, place, and time. She appears well-developed and well-nourished. No distress.  HENT:  Head: Normocephalic and  atraumatic.  Left Ear: External ear normal.  Cardiovascular: Normal rate, regular rhythm, normal heart sounds and intact distal pulses.   Pulmonary/Chest: Effort normal. No respiratory distress. She has no wheezes. She has rhonchi in the right middle field. She has no rales. She exhibits no tenderness.  Musculoskeletal: She exhibits no edema.  Neurological: She is alert and oriented to person, place, and time.  Skin: Skin is warm and dry. No rash noted.  Psychiatric: She has a normal mood and affect.    Assessment & Plan:  [name] was seen today for hypertension.  Diagnoses and all orders for this visit:  Essential hypertension  Anemia of chronic disease -     ferrous sulfate 325 (65 FE) MG tablet; Take 1 tablet (325 mg total) by mouth daily with breakfast.  Other ulcerative colitis without complication (HCC) -     hyoscyamine (LEVSIN, ANASPAZ) 0.125 MG tablet; Take 1 tablet (0.125 mg total) by mouth every 6 (six) hours as needed.  Productive cough -     DG Chest 2 View; Future   There are no diagnoses linked to this encounter.  No orders of the defined types were placed in this encounter.   Follow-up: Return in about 4 weeks (around 05/10/2016) for cough .   Boykin Nearing MD

## 2016-04-12 NOTE — Patient Instructions (Addendum)
[name] [name] was seen today for hypertension.  Diagnoses and all orders for this visit:  Essential hypertension  Anemia of chronic disease -     ferrous sulfate 325 (65 FE) MG tablet; Take 1 tablet (325 mg total) by mouth daily with breakfast.  Other ulcerative colitis without complication (HCC) -     hyoscyamine (LEVSIN, ANASPAZ) 0.125 MG tablet; Take 1 tablet (0.125 mg total) by mouth every 6 (six) hours as needed.  Productive cough -     DG Chest 2 View; Future   Your blood pressure is well controlled.   Due to cough and course breath sounds on the R, please go to [facility] for chest x-[place] to rule out pneumonia   You will be called with results   F/u in 4 weeks for cough   Dr. Adrian Blackwater

## 2016-04-12 NOTE — Assessment & Plan Note (Signed)
Refilled iron

## 2016-04-12 NOTE — Progress Notes (Signed)
Pt is here for a follow up on HTN.  Pt needs refills on iron pills,hyoscyamine. Pt is getting flu shot today.

## 2016-05-08 ENCOUNTER — Telehealth: Payer: Self-pay | Admitting: Family Medicine

## 2016-05-08 NOTE — Telephone Encounter (Signed)
Pt calling bc during last visit she was told to follow up in 4 weeks for cough however she states she no longer has a cough.  She would like to speak to nurse regarding when she is next due for a visit with PCP and whether she should still follow up for cough.

## 2016-05-17 MED FILL — sulfaSALAzine 500 MG TABS: 500 | 30 days supply | Qty: 120 | Fill #1

## 2016-05-17 MED FILL — ATORVASTATIN 10 MG TABLET: 10 | 30 days supply | Qty: 30 | Fill #8

## 2016-06-05 ENCOUNTER — Other Ambulatory Visit: Payer: Self-pay

## 2016-06-18 MED FILL — FERROUS SULFATE 325 MG TAB: 325 (65 FE) | 30 days supply | Qty: 30 | Fill #1

## 2016-06-18 MED FILL — ATORVASTATIN 10 MG TABLET: 10 | 30 days supply | Qty: 30 | Fill #9

## 2016-06-18 MED FILL — PANTOPRAZOLE SOD DR 20 MG T: 20 | 30 days supply | Qty: 60 | Fill #2

## 2016-06-19 ENCOUNTER — Ambulatory Visit: Payer: Self-pay

## 2016-06-19 ENCOUNTER — Other Ambulatory Visit: Payer: Self-pay | Admitting: *Deleted

## 2016-06-19 ENCOUNTER — Other Ambulatory Visit (INDEPENDENT_AMBULATORY_CARE_PROVIDER_SITE_OTHER): Payer: Self-pay

## 2016-06-19 ENCOUNTER — Ambulatory Visit: Payer: Self-pay | Admitting: Infectious Disease

## 2016-06-19 DIAGNOSIS — Z79899 Other long term (current) drug therapy: Secondary | ICD-10-CM

## 2016-06-19 DIAGNOSIS — Z113 Encounter for screening for infections with a predominantly sexual mode of transmission: Secondary | ICD-10-CM

## 2016-06-19 DIAGNOSIS — B20 Human immunodeficiency virus [HIV] disease: Secondary | ICD-10-CM

## 2016-06-19 LAB — COMPLETE METABOLIC PANEL WITH GFR
ALBUMIN: 4.2 g/dL (ref 3.6–5.1)
ALK PHOS: 856 U/L — AB (ref 33–130)
ALT: 53 U/L — ABNORMAL HIGH (ref 6–29)
AST: 73 U/L — AB (ref 10–35)
BILIRUBIN TOTAL: 0.5 mg/dL (ref 0.2–1.2)
BUN: 17 mg/dL (ref 7–25)
CALCIUM: 9.8 mg/dL (ref 8.6–10.4)
CO2: 26 mmol/L (ref 20–31)
Chloride: 103 mmol/L (ref 98–110)
Creat: 0.96 mg/dL (ref 0.50–1.05)
GFR, EST NON AFRICAN AMERICAN: 66 mL/min (ref >=60)
GFR, Est African American: 76 mL/min (ref >=60)
Glucose, Bld: 63 mg/dL — ABNORMAL LOW (ref 65–99)
Potassium: 3.9 mmol/L (ref 3.5–5.3)
Sodium: 139 mmol/L (ref 135–146)
TOTAL PROTEIN: 8.4 g/dL — AB (ref 6.1–8.1)

## 2016-06-19 LAB — CBC WITH DIFFERENTIAL/PLATELET
BASOS PCT: 1 %
Basophils Absolute: 56 cells/uL (ref 0–200)
Eosinophils Absolute: 336 cells/uL (ref 15–500)
Eosinophils Relative: 6 %
HCT: 35 % (ref 35.0–45.0)
Hemoglobin: 11.6 g/dL — ABNORMAL LOW (ref 11.7–15.5)
LYMPHS PCT: 51 %
Lymphs Abs: 2856 cells/uL (ref 850–3900)
MCH: 31.1 pg (ref 27.0–33.0)
MCHC: 33.1 g/dL (ref 32.0–36.0)
MCV: 93.8 fL (ref 80.0–100.0)
MONOS PCT: 10 %
MPV: 9.3 fL (ref 7.5–12.5)
Monocytes Absolute: 560 cells/uL (ref 200–950)
NEUTROS PCT: 32 %
Neutro Abs: 1792 cells/uL (ref 1500–7800)
PLATELETS: 389 10*3/uL (ref 140–400)
RBC: 3.73 MIL/uL — AB (ref 3.80–5.10)
RDW: 16.1 % — ABNORMAL HIGH (ref 11.0–15.0)
WBC: 5.6 10*3/uL (ref 3.8–10.8)

## 2016-06-19 LAB — LIPID PANEL
Cholesterol: 262 mg/dL — ABNORMAL HIGH (ref <200)
HDL: 75 mg/dL (ref >50)
LDL CALC: 162 mg/dL — AB (ref <100)
TRIGLYCERIDES: 125 mg/dL (ref <150)
Total CHOL/HDL Ratio: 3.5 Ratio (ref <5.0)
VLDL: 25 mg/dL (ref <30)

## 2016-06-20 LAB — T-HELPER CELL (CD4) - (RCID CLINIC ONLY)
CD4 T CELL HELPER: 20 % — AB (ref 33–55)
CD4 T Cell Abs: 610 /uL (ref 400–2700)

## 2016-06-20 LAB — RPR

## 2016-06-21 LAB — HIV-1 RNA QUANT-NO REFLEX-BLD

## 2016-07-03 ENCOUNTER — Other Ambulatory Visit: Payer: Self-pay | Admitting: Infectious Disease

## 2016-07-03 DIAGNOSIS — B20 Human immunodeficiency virus [HIV] disease: Secondary | ICD-10-CM

## 2016-07-05 ENCOUNTER — Other Ambulatory Visit: Payer: Self-pay | Admitting: Internal Medicine

## 2016-07-05 ENCOUNTER — Telehealth: Payer: Self-pay | Admitting: Infectious Disease

## 2016-07-05 DIAGNOSIS — B20 Human immunodeficiency virus [HIV] disease: Secondary | ICD-10-CM

## 2016-07-05 DIAGNOSIS — R636 Underweight: Secondary | ICD-10-CM

## 2016-07-05 DIAGNOSIS — I10 Essential (primary) hypertension: Secondary | ICD-10-CM

## 2016-07-05 MED ORDER — ELVITEG-COBIC-EMTRICIT-TENOFAF 150-150-200-10 MG PO TABS: 1.0000 | ORAL_TABLET | Freq: Every day | ORAL | 11 refills | Status: DC

## 2016-07-05 MED ORDER — MIRTAZAPINE 15 MG PO TBDP: 15.0000 mg | ORAL_TABLET | Freq: Every day | ORAL | 3 refills | Status: DC

## 2016-07-05 MED ORDER — AMLODIPINE BESYLATE 5 MG PO TABS: 5.0000 mg | ORAL_TABLET | Freq: Every day | ORAL | 3 refills | Status: DC

## 2016-07-05 MED ORDER — HYDROCHLOROTHIAZIDE 25 MG PO TABS: 25.0000 mg | ORAL_TABLET | Freq: Every day | ORAL | 3 refills | Status: DC

## 2016-07-05 NOTE — Telephone Encounter (Signed)
Left voice  message with Dericka that clnic will be closed and we will be calling to reschedule.

## 2016-07-05 NOTE — Progress Notes (Signed)
Sent in rx for genovya, mirtazapine, amlodipine, and HCTZ to [facility] [place]

## 2016-07-08 ENCOUNTER — Ambulatory Visit: Payer: Self-pay | Admitting: Infectious Disease

## 2016-07-08 ENCOUNTER — Ambulatory Visit: Payer: Self-pay

## 2016-07-12 ENCOUNTER — Ambulatory Visit: Payer: Self-pay | Admitting: Family Medicine

## 2016-07-19 MED FILL — HYOSCYAMINE SULF 0.125 MG T: 0.125 | 22 days supply | Qty: 90 | Fill #1

## 2016-07-19 MED FILL — FERROUS SULFATE 325 MG TAB: 325 (65 FE) | 30 days supply | Qty: 30 | Fill #2

## 2016-07-19 MED FILL — sulfaSALAzine 500 MG TABS: 500 | 30 days supply | Qty: 120 | Fill #2

## 2016-07-22 ENCOUNTER — Encounter: Payer: Self-pay | Admitting: Family Medicine

## 2016-07-22 ENCOUNTER — Ambulatory Visit: Payer: Self-pay | Attending: Family Medicine | Admitting: Family Medicine

## 2016-07-22 VITALS — BP 115/68 | HR 78 | Temp 98.3°F | Ht 68.0 in | Wt 122.4 lb

## 2016-07-22 DIAGNOSIS — Z1231 Encounter for screening mammogram for malignant neoplasm of breast: Secondary | ICD-10-CM

## 2016-07-22 DIAGNOSIS — Z79899 Other long term (current) drug therapy: Secondary | ICD-10-CM | POA: Insufficient documentation

## 2016-07-22 DIAGNOSIS — B20 Human immunodeficiency virus [HIV] disease: Secondary | ICD-10-CM | POA: Insufficient documentation

## 2016-07-22 DIAGNOSIS — Z811 Family history of alcohol abuse and dependence: Secondary | ICD-10-CM | POA: Insufficient documentation

## 2016-07-22 DIAGNOSIS — R87620 Atypical squamous cells of undetermined significance on cytologic smear of vagina (ASC-US): Secondary | ICD-10-CM

## 2016-07-22 DIAGNOSIS — K5180 Other ulcerative colitis without complications: Secondary | ICD-10-CM

## 2016-07-22 DIAGNOSIS — I10 Essential (primary) hypertension: Secondary | ICD-10-CM

## 2016-07-22 DIAGNOSIS — Z833 Family history of diabetes mellitus: Secondary | ICD-10-CM | POA: Insufficient documentation

## 2016-07-22 DIAGNOSIS — K5190 Ulcerative colitis, unspecified, without complications: Secondary | ICD-10-CM | POA: Insufficient documentation

## 2016-07-22 DIAGNOSIS — R87811 Vaginal high risk human papillomavirus (HPV) DNA test positive: Secondary | ICD-10-CM

## 2016-07-22 DIAGNOSIS — Z0000 Encounter for general adult medical examination without abnormal findings: Secondary | ICD-10-CM

## 2016-07-22 DIAGNOSIS — F17210 Nicotine dependence, cigarettes, uncomplicated: Secondary | ICD-10-CM | POA: Insufficient documentation

## 2016-07-22 DIAGNOSIS — Z8379 Family history of other diseases of the digestive system: Secondary | ICD-10-CM | POA: Insufficient documentation

## 2016-07-22 DIAGNOSIS — Z823 Family history of stroke: Secondary | ICD-10-CM | POA: Insufficient documentation

## 2016-07-22 MED ORDER — HYOSCYAMINE SULFATE 0.125 MG PO TABS: 0.1250 mg | ORAL_TABLET | Freq: Four times a day (QID) | ORAL | 1 refills | Status: DC | PRN

## 2016-07-22 NOTE — Patient Instructions (Addendum)
Devine was seen today for hypertension.  Diagnoses and all orders for this visit:  Healthcare maintenance -     Ambulatory referral to Gastroenterology  Visit for screening mammogram -     MM DIGITAL SCREENING BILATERAL; Future  Other ulcerative colitis without complication (HCC) -     hyoscyamine (LEVSIN, ANASPAZ) 0.125 MG tablet; Take 1 tablet (0.125 mg total) by mouth every 6 (six) hours as needed.  Atypical squamous cell changes of undetermined significance (ASCUS) on vaginal cytology with positive high risk human papilloma virus (HPV)  Essential hypertension  your blood pressure is normal Continue current treatment  F/u in 3-4 weeks for pap smear  Dr. Adrian Blackwater

## 2016-07-22 NOTE — Assessment & Plan Note (Signed)
Well-controlled.  Continue current regimen. 

## 2016-07-22 NOTE — Progress Notes (Signed)
Subjective:  Patient ID: [name] [name], female    DOB: 1961/01/13  Age: 56 y.o. MRN: [id number]  CC: Hypertension   HPI [name] [name] has  HIV (hx of AIDs), ulcerative colitis, HTN she presents with her granddaughter [name] [name] for   1.  HTN: compliant with norvasc 5 mg daily and HCTZ 25 mg daily. No HA, CP, SOB or swelling. Doing very well. She has no complaints.   2. HIV: followed by ID clinic. Last labs 06/19/2016. HIV viral load undectable. CD4 610.   3.  HM: due for mammogram, limited by cost. Due for c-scope.  No breast pain. No blood in stool. She will have medicare starting next year  Family History  Problem Relation Age of Onset  . Cancer Father     ?  Marland Kitchen Alcoholism Father   . Diabetes Mother   . Stroke Mother   . Pancreatitis Sister   . Diabetes Sister   . Cancer Paternal Uncle 102    colon     Social History  Substance Use Topics  . Smoking status: Current Some Day Smoker    Packs/day: 0.20    Years: 35.00    Types: Cigarettes  . Smokeless tobacco: Never Used     Comment: Smoking 2-3 cigs per day  . Alcohol use No    Outpatient Medications Prior to Visit  Medication Sig Dispense Refill  . amLODipine (NORVASC) 5 MG tablet Take 1 tablet (5 mg total) by mouth daily. 90 tablet 3  . atorvastatin (LIPITOR) 10 MG tablet Take 1 tablet (10 mg total) by mouth daily. 30 tablet 11  . calcium citrate-vitamin D 500-400 MG-UNIT chewable tablet Chew 1 tablet by mouth 2 (two) times daily. 180 tablet 1  . feeding supplement, ENSURE COMPLETE, (ENSURE COMPLETE) LIQD Take 237 mLs by mouth 3 (three) times daily between meals. 237 mL 0  . ferrous sulfate 325 (65 FE) MG tablet Take 1 tablet (325 mg total) by mouth daily with breakfast. 90 tablet 3  . GENVOYA 150-150-200-10 MG TABS tablet TAKE 1 TABLET BY MOUTH DAILY WITH BREAKFAST 30 tablet 5  . hydrochlorothiazide (HYDRODIURIL) 25 MG tablet Take 1 tablet (25 mg total) by mouth daily. 90 tablet 3  . pantoprazole (PROTONIX) 20 MG tablet  TAKE 1 TABLET BY MOUTH 2 TIMES DAILY BEFORE A MEAL. 60 tablet 3  . sulfaSALAzine (AZULFIDINE) 500 MG tablet Take 2 tablets (1,000 mg total) by mouth 2 (two) times daily. 120 tablet 5  . sulfaSALAzine (AZULFIDINE) 500 MG tablet TAKE 2 TABLETS BY MOUTH 2 TIMES DAILY 120 tablet 2  . cetirizine (ZYRTEC) 10 MG tablet Take 1 tablet (10 mg total) by mouth daily. (Patient not taking: Reported on 07/22/2016) 30 tablet 11  . hyoscyamine (LEVSIN, ANASPAZ) 0.125 MG tablet Take 1 tablet (0.125 mg total) by mouth every 6 (six) hours as needed. (Patient not taking: Reported on 07/22/2016) 180 tablet 1  . mirtazapine (REMERON SOL-TAB) 15 MG disintegrating tablet Take 1 tablet (15 mg total) by mouth at bedtime. (Patient not taking: Reported on 07/22/2016) 90 tablet 3   No facility-administered medications prior to visit.     ROS Review of Systems  Constitutional: Negative for chills and fever.  Eyes: Negative for visual disturbance.  Respiratory: Negative for cough and shortness of breath.   Cardiovascular: Negative for chest pain.  Gastrointestinal: Negative for abdominal pain and blood in stool.  Musculoskeletal: Negative for arthralgias and back pain.  Skin: Negative for rash.  Allergic/Immunologic: Negative for immunocompromised state.  Hematological: Negative for adenopathy. Does not bruise/bleed easily.  Psychiatric/Behavioral: Negative for dysphoric mood and suicidal ideas.    Objective:  BP 115/68 (BP Location: Left Arm, Patient Position: Sitting, Cuff Size: Small)   Pulse 78   Temp 98.3 F (36.8 C) (Oral)   Ht 5' 8"  (1.727 m)   Wt 122 lb 6.4 oz (55.5 kg)   SpO2 100%   BMI 18.61 kg/m   BP/Weight 07/22/2016 04/12/2016 1/94/1740  Systolic BP 814 481 856  Diastolic BP 68 72 79  Wt. (Lbs) 122.4 123.6 125.4  BMI 18.61 18.79 19.07    Physical Exam  Constitutional: She is oriented to person, place, and time. She appears well-developed and well-nourished. No distress.  HENT:  Head: Normocephalic  and atraumatic.  Left Ear: External ear normal.  Cardiovascular: Normal rate, regular rhythm, normal heart sounds and intact distal pulses.   Pulmonary/Chest: Effort normal. No respiratory distress. She has no wheezes. She has no rales. She exhibits no tenderness.  Musculoskeletal: She exhibits no edema.  Neurological: She is alert and oriented to person, place, and time.  Skin: Skin is warm and dry. No rash noted.  Psychiatric: She has a normal mood and affect.    Assessment & Plan:  Oluwademilade was seen today for hypertension.  Diagnoses and all orders for this visit:  Healthcare maintenance -     Ambulatory referral to Gastroenterology  Visit for screening mammogram -     MM DIGITAL SCREENING BILATERAL; Future  Other ulcerative colitis without complication (HCC) -     hyoscyamine (LEVSIN, ANASPAZ) 0.125 MG tablet; Take 1 tablet (0.125 mg total) by mouth every 6 (six) hours as needed.  Atypical squamous cell changes of undetermined significance (ASCUS) on vaginal cytology with positive high risk human papilloma virus (HPV)  Essential hypertension   There are no diagnoses linked to this encounter.  No orders of the defined types were placed in this encounter.   Follow-up: Return in about 4 weeks (around 08/19/2016) for pap smear .   Boykin Nearing MD

## 2016-07-22 NOTE — Assessment & Plan Note (Addendum)
ASCUS w/+ HR HPV, normal exam today with absent cervix, hysterectomy >20 years ago for benign indication - no colpo or biopsy today - repeat pap in 1 year, if negative will not need any more paps  Plan: Pap at next OV

## 2016-07-30 ENCOUNTER — Ambulatory Visit: Payer: Self-pay

## 2016-08-22 ENCOUNTER — Other Ambulatory Visit: Payer: Self-pay | Admitting: Family Medicine

## 2016-08-26 ENCOUNTER — Ambulatory Visit: Payer: Self-pay

## 2016-09-11 ENCOUNTER — Ambulatory Visit: Payer: Self-pay | Admitting: Infectious Disease

## 2016-09-11 ENCOUNTER — Ambulatory Visit: Payer: Self-pay

## 2016-09-12 ENCOUNTER — Other Ambulatory Visit: Payer: Self-pay | Admitting: Infectious Disease

## 2016-09-12 DIAGNOSIS — B20 Human immunodeficiency virus [HIV] disease: Secondary | ICD-10-CM

## 2016-09-18 ENCOUNTER — Ambulatory Visit: Payer: Self-pay

## 2016-09-30 ENCOUNTER — Encounter: Payer: Self-pay | Admitting: Family Medicine

## 2016-10-04 ENCOUNTER — Other Ambulatory Visit: Payer: Self-pay | Admitting: Family Medicine

## 2016-10-04 ENCOUNTER — Other Ambulatory Visit: Payer: Self-pay | Admitting: Infectious Disease

## 2016-10-04 DIAGNOSIS — B20 Human immunodeficiency virus [HIV] disease: Secondary | ICD-10-CM

## 2016-10-04 MED FILL — ATORVASTATIN 10 MG TABLET: 10 | 30 days supply | Qty: 30 | Fill #0

## 2016-10-04 MED FILL — sulfaSALAzine 500 MG TABS: 500 | 30 days supply | Qty: 120 | Fill #0

## 2016-10-04 MED FILL — PANTOPRAZOLE SOD DR 20 MG T: 20 | 30 days supply | Qty: 60 | Fill #3

## 2016-10-08 ENCOUNTER — Encounter: Payer: Self-pay | Admitting: Family Medicine

## 2016-10-08 ENCOUNTER — Ambulatory Visit: Payer: Self-pay | Attending: Family Medicine | Admitting: Family Medicine

## 2016-10-08 VITALS — BP 114/73 | HR 87 | Temp 98.2°F | Ht 68.0 in | Wt 118.2 lb

## 2016-10-08 DIAGNOSIS — Z79899 Other long term (current) drug therapy: Secondary | ICD-10-CM | POA: Insufficient documentation

## 2016-10-08 DIAGNOSIS — F17210 Nicotine dependence, cigarettes, uncomplicated: Secondary | ICD-10-CM | POA: Insufficient documentation

## 2016-10-08 DIAGNOSIS — K5190 Ulcerative colitis, unspecified, without complications: Secondary | ICD-10-CM | POA: Insufficient documentation

## 2016-10-08 DIAGNOSIS — R197 Diarrhea, unspecified: Secondary | ICD-10-CM | POA: Insufficient documentation

## 2016-10-08 DIAGNOSIS — I10 Essential (primary) hypertension: Secondary | ICD-10-CM | POA: Insufficient documentation

## 2016-10-08 NOTE — Patient Instructions (Signed)
Diagnoses and all orders for this visit:  Essential hypertension   Your blood pressure is excellent Since you have no chest pain and no more palpitations, no cardiology evaluation needed for stress test If you develop chest pain, palpitations or fatigue I do recommend cardiology evaluation   f/u in 3 months for HTN  Dr. Adrian Blackwater

## 2016-10-08 NOTE — Progress Notes (Signed)
Subjective:  Patient ID: [name] [name], female    DOB: June 28, 1960  Age: 56 y.o. MRN: [id number]  CC: Follow-up and Diarrhea   HPI [name] [name] has HIV, Ulcerative colitis, HTN she  presents for   1. ED f/u heart palpitations: she was seen in [facility] for palpitations. Troponin, CXR and EKG were normal. She smoked cigarette and drank coffee earlier that morning. She denies recurrent symptoms. She denies fatigue or dyspnea on exertion.   2. UC: no blood in stool. No fever or chills. Diarrhea for past 2 days following eating at cookout. She denies nausea and vomiting. She is not taking hyoscyamine.   Social History  Substance Use Topics  . Smoking status: Current Some Day Smoker    Packs/day: 0.20    Years: 35.00    Types: Cigarettes  . Smokeless tobacco: Never Used     Comment: Smoking 2-3 cigs per day  . Alcohol use No    Outpatient Medications Prior to Visit  Medication Sig Dispense Refill  . amLODipine (NORVASC) 5 MG tablet Take 1 tablet (5 mg total) by mouth daily. 90 tablet 3  . atorvastatin (LIPITOR) 10 MG tablet TAKE 1 TABLET BY MOUTH DAILY. 30 tablet 5  . calcium citrate-vitamin D 500-400 MG-UNIT chewable tablet Chew 1 tablet by mouth 2 (two) times daily. 180 tablet 1  . cetirizine (ZYRTEC) 10 MG tablet Take 1 tablet (10 mg total) by mouth daily. 30 tablet 11  . feeding supplement, ENSURE COMPLETE, (ENSURE COMPLETE) LIQD Take 237 mLs by mouth 3 (three) times daily between meals. 237 mL 0  . ferrous sulfate 325 (65 FE) MG tablet Take 1 tablet (325 mg total) by mouth daily with breakfast. 90 tablet 3  . GENVOYA 150-150-200-10 MG TABS tablet TAKE 1 TABLET BY MOUTH DAILY WITH BREAKFAST 30 tablet 5  . hydrochlorothiazide (HYDRODIURIL) 25 MG tablet Take 1 tablet (25 mg total) by mouth daily. 90 tablet 3  . pantoprazole (PROTONIX) 20 MG tablet TAKE 1 TABLET BY MOUTH 2 TIMES DAILY BEFORE A MEAL. 60 tablet 3  . sulfaSALAzine (AZULFIDINE) 500 MG tablet TAKE 2 TABLETS BY  MOUTH 2 TIMES DAILY 120 tablet 2  . hyoscyamine (LEVSIN, ANASPAZ) 0.125 MG tablet Take 1 tablet (0.125 mg total) by mouth every 6 (six) hours as needed. (Patient not taking: Reported on 10/08/2016) 180 tablet 1   No facility-administered medications prior to visit.     ROS Review of Systems  Constitutional: Negative for chills and fever.  Eyes: Negative for visual disturbance.  Respiratory: Negative for shortness of breath.   Cardiovascular: Negative for chest pain.  Gastrointestinal: Negative for abdominal pain and blood in stool.  Musculoskeletal: Negative for arthralgias and back pain.  Skin: Negative for rash.  Allergic/Immunologic: Negative for immunocompromised state.  Hematological: Negative for adenopathy. Does not bruise/bleed easily.  Psychiatric/Behavioral: Negative for dysphoric mood and suicidal ideas.    Objective:  BP 114/73   Pulse 87   Temp 98.2 F (36.8 C) (Oral)   Ht 5' 8"  (1.727 m)   Wt 118 lb 3.2 oz (53.6 kg)   SpO2 100%   BMI 17.97 kg/m   BP/Weight 10/08/2016 07/22/2016 28/78/6767  Systolic BP 209 470 962  Diastolic BP 73 68 72  Wt. (Lbs) 118.2 122.4 123.6  BMI 17.97 18.61 18.79    Physical Exam  Constitutional: She is oriented to person, place, and time. She appears well-developed and well-nourished. No distress.  HENT:  Head: Normocephalic and atraumatic.  Cardiovascular: Normal  rate, regular rhythm, normal heart sounds and intact distal pulses.   Pulmonary/Chest: Effort normal and breath sounds normal.  Abdominal: Soft. Bowel sounds are normal. She exhibits no distension and no mass. There is no tenderness. There is no rebound and no guarding.  Musculoskeletal: She exhibits no edema.  Neurological: She is alert and oriented to person, place, and time.  Skin: Skin is warm and dry. No rash noted.  Psychiatric: She has a normal mood and affect.    Assessment & Plan:  [name] [name] was seen today for follow-up and diarrhea.  Diagnoses and all orders  for this visit:  Essential hypertension   There are no diagnoses linked to this encounter.  No orders of the defined types were placed in this encounter.   Follow-up: Return in about 3 months (around 01/07/2017) for HTN .   Boykin Nearing MD

## 2016-10-08 NOTE — Assessment & Plan Note (Signed)
A; well controlled HTN, patient with recent ED visit for palpitations. She is a smoker and has HLD. She is at risk for CAD. Denies recurrent symptoms P: Smoking cessation Continue current anti hypertensive regimen Continue statin She is advised to return if symptoms return

## 2016-10-28 ENCOUNTER — Ambulatory Visit (INDEPENDENT_AMBULATORY_CARE_PROVIDER_SITE_OTHER): Payer: Medicaid Other | Admitting: Infectious Disease

## 2016-10-28 ENCOUNTER — Encounter: Payer: Self-pay | Admitting: Infectious Disease

## 2016-10-28 VITALS — Temp 97.8°F | Wt 121.0 lb

## 2016-10-28 DIAGNOSIS — B20 Human immunodeficiency virus [HIV] disease: Secondary | ICD-10-CM

## 2016-10-28 DIAGNOSIS — I10 Essential (primary) hypertension: Secondary | ICD-10-CM

## 2016-10-28 DIAGNOSIS — K5180 Other ulcerative colitis without complications: Secondary | ICD-10-CM | POA: Diagnosis not present

## 2016-10-28 DIAGNOSIS — E784 Other hyperlipidemia: Secondary | ICD-10-CM

## 2016-10-28 DIAGNOSIS — E7849 Other hyperlipidemia: Secondary | ICD-10-CM

## 2016-10-28 LAB — CBC WITH DIFFERENTIAL/PLATELET
BASOS ABS: 0 {cells}/uL (ref 0–200)
Basophils Relative: 0 %
EOS PCT: 4 %
Eosinophils Absolute: 252 cells/uL (ref 15–500)
HCT: 33.2 % — ABNORMAL LOW (ref 35.0–45.0)
HEMOGLOBIN: 11.1 g/dL — AB (ref 11.7–15.5)
LYMPHS PCT: 30 %
Lymphs Abs: 1890 cells/uL (ref 850–3900)
MCH: 31.4 pg (ref 27.0–33.0)
MCHC: 33.4 g/dL (ref 32.0–36.0)
MCV: 94.1 fL (ref 80.0–100.0)
MONOS PCT: 7 %
MPV: 9.5 fL (ref 7.5–12.5)
Monocytes Absolute: 441 cells/uL (ref 200–950)
NEUTROS PCT: 59 %
Neutro Abs: 3717 cells/uL (ref 1500–7800)
PLATELETS: 354 10*3/uL (ref 140–400)
RBC: 3.53 MIL/uL — ABNORMAL LOW (ref 3.80–5.10)
RDW: 15.9 % — AB (ref 11.0–15.0)
WBC: 6.3 10*3/uL (ref 3.8–10.8)

## 2016-10-28 NOTE — Progress Notes (Signed)
Chief complaint: Follow-up for HIV on medications Subjective:    Patient ID: [name] [name], female    DOB: 1960-12-17, 56 y.o.   MRN: [id number]  HPI  This is a 56 year-old African-American lady with HIV, ulcerative colitis hypertension on GENVOYA for HIV and doing perfectly with re to virological suppression. She is happy she now has Medicare and Medicaid and is going to get plugged in with a Gastroenterologist to manage her UC.    Lab Results  Component Value Date   HIV1RNAQUANT <20 06/19/2016   HIV1RNAQUANT <20 12/26/2015   HIV1RNAQUANT <20 07/03/2015    Lab Results  Component Value Date   CD4TABS 610 06/19/2016   CD4TABS 550 12/26/2015   CD4TABS 520 07/03/2015   Past Medical History:  Diagnosis Date  . Arthritis   . Cavities 02/02/2015  . GERD (gastroesophageal reflux disease) Dx 2014  . GI bleed   . HIV (human immunodeficiency virus infection) ([place]) Dx 2015  . Hypertension Dx 2014  . Substance abuse    Quit 2008  . Ulcerative colitis [facility])     Past Surgical History:  Procedure Laterality Date  . ABDOMINAL HYSTERECTOMY  2001    done in [facility], patient unsure of indication  . TEE WITHOUT CARDIOVERSION N/A 05/02/2014   Procedure: TRANSESOPHAGEAL ECHOCARDIOGRAM (TEE);  Surgeon: Larey Dresser, MD;  Location: [place];  Service: Cardiovascular;  Laterality: N/A;  . VIDEO BRONCHOSCOPY Bilateral 04/27/2014   Procedure: VIDEO BRONCHOSCOPY WITH FLUORO;  Surgeon: Rigoberto Noel, MD;  Location: [facility];  Service: Cardiopulmonary;  Laterality: Bilateral;    Family History  Problem Relation Age of Onset  . Cancer Father        ?  Marland Kitchen Alcoholism Father   . Diabetes Mother   . Stroke Mother   . Pancreatitis Sister   . Diabetes Sister   . Cancer Paternal Uncle 58       colon       Social History   Social History  . Marital status: Single    Spouse name: N/A  . Number of children: N/A  . Years of education: N/A   Social History Main Topics  .  Smoking status: Former Smoker    Packs/day: 0.20    Years: 35.00    Types: Cigarettes  . Smokeless tobacco: Never Used     Comment: Smoking 2-3 cigs per day  . Alcohol use No  . Drug use: No     Comment: no drugs per patient since 2009  . Sexual activity: Yes    Birth control/ protection: Other-see comments, Surgical     Comment: pt. given condoms   Other Topics Concern  . None   Social History Narrative  . None    No Known Allergies   Current Outpatient Prescriptions:  .  amLODipine (NORVASC) 5 MG tablet, Take 1 tablet (5 mg total) by mouth daily., Disp: 90 tablet, Rfl: 3 .  atorvastatin (LIPITOR) 10 MG tablet, TAKE 1 TABLET BY MOUTH DAILY., Disp: 30 tablet, Rfl: 5 .  calcium citrate-vitamin D 500-400 MG-UNIT chewable tablet, Chew 1 tablet by mouth 2 (two) times daily., Disp: 180 tablet, Rfl: 1 .  cetirizine (ZYRTEC) 10 MG tablet, Take 1 tablet (10 mg total) by mouth daily., Disp: 30 tablet, Rfl: 11 .  feeding supplement, ENSURE COMPLETE, (ENSURE COMPLETE) LIQD, Take 237 mLs by mouth 3 (three) times daily between meals., Disp: 237 mL, Rfl: 0 .  ferrous sulfate 325 (65 FE) MG tablet, Take 1  tablet (325 mg total) by mouth daily with breakfast., Disp: 90 tablet, Rfl: 3 .  GENVOYA 150-150-200-10 MG TABS tablet, TAKE 1 TABLET BY MOUTH DAILY WITH BREAKFAST, Disp: 30 tablet, Rfl: 5 .  hydrochlorothiazide (HYDRODIURIL) 25 MG tablet, Take 1 tablet (25 mg total) by mouth daily., Disp: 90 tablet, Rfl: 3 .  hyoscyamine (LEVSIN, ANASPAZ) 0.125 MG tablet, Take 1 tablet (0.125 mg total) by mouth every 6 (six) hours as needed., Disp: 180 tablet, Rfl: 1 .  sulfaSALAzine (AZULFIDINE) 500 MG tablet, TAKE 2 TABLETS BY MOUTH 2 TIMES DAILY, Disp: 120 tablet, Rfl: 2 .  pantoprazole (PROTONIX) 20 MG tablet, TAKE 1 TABLET BY MOUTH 2 TIMES DAILY BEFORE A MEAL. (Patient not taking: Reported on 10/28/2016), Disp: 60 tablet, Rfl: 3   Review of Systems  Constitutional: Negative for activity change, appetite  change, chills, diaphoresis, fatigue, fever and unexpected weight change.  HENT: Negative for congestion, rhinorrhea, sinus pressure, sneezing, sore throat and trouble swallowing.   Eyes: Negative for photophobia and visual disturbance.  Respiratory: Negative for cough, shortness of breath, wheezing and stridor.   Cardiovascular: Negative for chest pain, palpitations and leg swelling.  Gastrointestinal: Negative for abdominal distention, abdominal pain, anal bleeding, blood in stool, constipation, diarrhea, nausea and vomiting.  Genitourinary: Negative for difficulty urinating, dysuria, flank pain and hematuria.  Musculoskeletal: Negative for arthralgias, back pain, gait problem, joint swelling and myalgias.  Skin: Negative for color change, pallor, rash and wound.  Neurological: Negative for dizziness, tremors, weakness and light-headedness.  Hematological: Negative for adenopathy. Does not bruise/bleed easily.  Psychiatric/Behavioral: Negative for agitation, behavioral problems, confusion, decreased concentration, dysphoric mood and sleep disturbance.       Objective:   Physical Exam  Constitutional: She is oriented to person, place, and time. She appears well-developed and well-nourished. No distress.  HENT:  Head: Normocephalic and atraumatic.  Mouth/Throat: No oropharyngeal exudate.  Eyes: EOM are normal. No scleral icterus.  Neck: Normal range of motion. Neck supple.  Cardiovascular: Normal rate and regular rhythm.   Pulmonary/Chest: Effort normal. No respiratory distress. She has no wheezes.  Abdominal: She exhibits no distension.  Musculoskeletal: She exhibits no edema or tenderness.  Neurological: She is alert and oriented to person, place, and time. She exhibits normal muscle tone. Coordination normal.  Skin: Skin is warm and dry. No rash noted. She is not diaphoretic. No erythema. No pallor.  Psychiatric: She has a normal mood and affect. Her behavior is normal. Judgment and  thought content normal.          Assessment & Plan:   HIV: Continue GENVOYA, check labs today. In future likely change to [facility] to miniimize drug drug interactions   Hypertension :  Much Better controlled Vitals:   10/28/16 0923  Temp: 97.8 F (36.6 C)   UC: to see GI  Hyperlipidemia: check lipids today on statin Lipid Panel     Component Value Date/Time   CHOL 262 (H) 06/19/2016 1159   TRIG 125 06/19/2016 1159   HDL 75 06/19/2016 1159   CHOLHDL 3.5 06/19/2016 1159   VLDL 25 06/19/2016 1159   LDLCALC 162 (H) 06/19/2016 1159     I spent greater than 25 minutes with the patient including greater than 50% of time in face to face counsel of the patient's HIV infection blood pressure, UC, hyperlpidemia and in coordination of her care.

## 2016-10-29 LAB — COMPLETE METABOLIC PANEL WITH GFR
ALT: 67 U/L — ABNORMAL HIGH (ref 6–29)
AST: 98 U/L — ABNORMAL HIGH (ref 10–35)
Albumin: 4 g/dL (ref 3.6–5.1)
Alkaline Phosphatase: 807 U/L — ABNORMAL HIGH (ref 33–130)
BUN: 17 mg/dL (ref 7–25)
CHLORIDE: 103 mmol/L (ref 98–110)
CO2: 24 mmol/L (ref 20–31)
Calcium: 9.6 mg/dL (ref 8.6–10.4)
Creat: 0.96 mg/dL (ref 0.50–1.05)
GFR, EST AFRICAN AMERICAN: 76 mL/min (ref >=60)
GFR, EST NON AFRICAN AMERICAN: 66 mL/min (ref >=60)
Glucose, Bld: 117 mg/dL — ABNORMAL HIGH (ref 65–99)
Potassium: 3.9 mmol/L (ref 3.5–5.3)
SODIUM: 139 mmol/L (ref 135–146)
Total Bilirubin: 0.7 mg/dL (ref 0.2–1.2)
Total Protein: 8 g/dL (ref 6.1–8.1)

## 2016-10-29 LAB — T-HELPER CELL (CD4) - (RCID CLINIC ONLY)
CD4 % Helper T Cell: 18 % — ABNORMAL LOW (ref 33–55)
CD4 T CELL ABS: 390 /uL — AB (ref 400–2700)

## 2016-10-29 LAB — RPR

## 2016-10-29 LAB — LIPID PANEL
CHOL/HDL RATIO: 3.2 ratio (ref <5.0)
Cholesterol: 249 mg/dL — ABNORMAL HIGH (ref <200)
HDL: 77 mg/dL (ref >50)
LDL CALC: 156 mg/dL — AB (ref <100)
TRIGLYCERIDES: 79 mg/dL (ref <150)
VLDL: 16 mg/dL (ref <30)

## 2016-10-30 ENCOUNTER — Encounter: Payer: Self-pay | Admitting: Family Medicine

## 2016-10-30 LAB — HIV-1 RNA QUANT-NO REFLEX-BLD
HIV 1 RNA QUANT: NOT DETECTED {copies}/mL
HIV-1 RNA QUANT, LOG: NOT DETECTED {Log_copies}/mL

## 2016-11-13 ENCOUNTER — Other Ambulatory Visit: Payer: Self-pay | Admitting: Family Medicine

## 2016-11-13 MED FILL — ATORVASTATIN 10 MG TABLET: 10 | 30 days supply | Qty: 30 | Fill #1

## 2016-11-13 MED FILL — sulfaSALAzine 500 MG TABS: 500 | 30 days supply | Qty: 120 | Fill #1

## 2016-11-13 MED FILL — FERROUS SULFATE 325 MG TAB: 325 (65 FE) | 30 days supply | Qty: 30 | Fill #3

## 2016-11-13 MED FILL — HYOSCYAMINE SULF 0.125 MG T: 0.125 | 22 days supply | Qty: 90 | Fill #2

## 2016-11-13 MED FILL — PANTOPRAZOLE SOD DR 20 MG T: 20 | 30 days supply | Qty: 60 | Fill #0

## 2016-12-03 ENCOUNTER — Ambulatory Visit: Payer: Medicare (Managed Care)

## 2016-12-12 ENCOUNTER — Encounter: Payer: Medicare Other | Admitting: Family Medicine

## 2016-12-12 ENCOUNTER — Ambulatory Visit
Admission: RE | Admit: 2016-12-12 | Discharge: 2016-12-12 | Disposition: A | Discharge status: Home / Self Care | Payer: Medicare Other | Source: Ambulatory Visit | Attending: Family Medicine | Admitting: Family Medicine

## 2016-12-12 DIAGNOSIS — Z1231 Encounter for screening mammogram for malignant neoplasm of breast: Secondary | ICD-10-CM

## 2016-12-17 ENCOUNTER — Encounter: Payer: Self-pay | Admitting: Family Medicine

## 2016-12-17 ENCOUNTER — Other Ambulatory Visit (HOSPITAL_COMMUNITY)
Admission: RE | Admit: 2016-12-17 | Discharge: 2016-12-17 | Disposition: A | Discharge status: Home / Self Care | Payer: Medicare Other | Source: Ambulatory Visit | Attending: Family Medicine | Admitting: Family Medicine

## 2016-12-17 ENCOUNTER — Ambulatory Visit: Payer: Medicare Other | Attending: Family Medicine | Admitting: Family Medicine

## 2016-12-17 VITALS — BP 116/70 | HR 65 | Temp 97.8°F | Ht 68.0 in | Wt 120.0 lb

## 2016-12-17 DIAGNOSIS — Z0000 Encounter for general adult medical examination without abnormal findings: Secondary | ICD-10-CM | POA: Insufficient documentation

## 2016-12-17 DIAGNOSIS — I10 Essential (primary) hypertension: Secondary | ICD-10-CM | POA: Insufficient documentation

## 2016-12-17 DIAGNOSIS — R87811 Vaginal high risk human papillomavirus (HPV) DNA test positive: Secondary | ICD-10-CM | POA: Insufficient documentation

## 2016-12-17 DIAGNOSIS — R358 Other polyuria: Secondary | ICD-10-CM

## 2016-12-17 DIAGNOSIS — Z87891 Personal history of nicotine dependence: Secondary | ICD-10-CM | POA: Diagnosis not present

## 2016-12-17 DIAGNOSIS — R252 Cramp and spasm: Secondary | ICD-10-CM

## 2016-12-17 DIAGNOSIS — R87620 Atypical squamous cells of undetermined significance on cytologic smear of vagina (ASC-US): Secondary | ICD-10-CM

## 2016-12-17 DIAGNOSIS — M79669 Pain in unspecified lower leg: Secondary | ICD-10-CM | POA: Diagnosis not present

## 2016-12-17 DIAGNOSIS — R3589 Other polyuria: Secondary | ICD-10-CM

## 2016-12-17 LAB — POCT URINALYSIS DIPSTICK
Bilirubin, UA: NEGATIVE
GLUCOSE UA: NEGATIVE
Ketones, UA: NEGATIVE
LEUKOCYTES UA: NEGATIVE
NITRITE UA: NEGATIVE
Spec Grav, UA: 1.02 (ref 1.010–1.025)
UROBILINOGEN UA: 0.2 U/dL
pH, UA: 5.5 (ref 5.0–8.0)

## 2016-12-17 MED ORDER — ACETAMINOPHEN-CODEINE #3 300-30 MG PO TABS: 1.0000 | ORAL_TABLET | Freq: Three times a day (TID) | ORAL | 0 refills | Status: DC | PRN

## 2016-12-17 MED ORDER — AMLODIPINE BESYLATE 10 MG PO TABS: 10.0000 mg | ORAL_TABLET | Freq: Every day | ORAL | 3 refills | Status: DC

## 2016-12-17 MED ORDER — HYDROCHLOROTHIAZIDE 12.5 MG PO TABS: 12.5000 mg | ORAL_TABLET | Freq: Every day | ORAL | 3 refills | Status: DC

## 2016-12-17 MED FILL — ACETAMINOPHEN/COD #3 TABLET: 300-30 | 20 days supply | Qty: 30 | Fill #0

## 2016-12-17 NOTE — Progress Notes (Signed)
SUBJECTIVE:  56 y.o. female for annual routine pap of vaginal cuff and wellness physical.  1. Repeat pap of vaginal cuff: she has ASCUS w/ + HR HPV pap in 08/2014. She had f/u colposcopy of vaginal cuff which was normal. The recommendation was for repeat pap in one year if normal stop pap smears.   HM: she had a normal mammogram on 12/12/16.   Social History  Substance Use Topics  . Smoking status: Former Smoker    Packs/day: 0.20    Years: 35.00    Types: Cigarettes  . Smokeless tobacco: Never Used     Comment: Smoking 2-3 cigs per day  . Alcohol use No   Current Outpatient Prescriptions  Medication Sig Dispense Refill  . amLODipine (NORVASC) 5 MG tablet Take 1 tablet (5 mg total) by mouth daily. 90 tablet 3  . atorvastatin (LIPITOR) 10 MG tablet TAKE 1 TABLET BY MOUTH DAILY. 30 tablet 5  . calcium citrate-vitamin D 500-400 MG-UNIT chewable tablet Chew 1 tablet by mouth 2 (two) times daily. 180 tablet 1  . cetirizine (ZYRTEC) 10 MG tablet Take 1 tablet (10 mg total) by mouth daily. 30 tablet 11  . feeding supplement, ENSURE COMPLETE, (ENSURE COMPLETE) LIQD Take 237 mLs by mouth 3 (three) times daily between meals. 237 mL 0  . ferrous sulfate 325 (65 FE) MG tablet Take 1 tablet (325 mg total) by mouth daily with breakfast. 90 tablet 3  . GENVOYA 150-150-200-10 MG TABS tablet TAKE 1 TABLET BY MOUTH DAILY WITH BREAKFAST 30 tablet 5  . hydrochlorothiazide (HYDRODIURIL) 25 MG tablet Take 1 tablet (25 mg total) by mouth daily. 90 tablet 3  . hyoscyamine (LEVSIN, ANASPAZ) 0.125 MG tablet Take 1 tablet (0.125 mg total) by mouth every 6 (six) hours as needed. 180 tablet 1  . pantoprazole (PROTONIX) 20 MG tablet TAKE 1 TABLET BY MOUTH 2 TIMES DAILY BEFORE A MEAL. 60 tablet 2  . sulfaSALAzine (AZULFIDINE) 500 MG tablet TAKE 2 TABLETS BY MOUTH 2 TIMES DAILY 120 tablet 2   No current facility-administered medications for this visit.    Allergies: Patient has no known allergies.  No LMP recorded.  Patient has had a hysterectomy.  She complains of frequent urination and cramping in legs. ROS:  Feeling well. No dyspnea or chest pain on exertion.  No abdominal pain, change in bowel habits, black or bloody stools.  No urinary tract symptoms. GYN ROS: normal menses, no abnormal bleeding, pelvic pain or discharge, no breast pain or new or enlarging lumps on self exam. No neurological complaints.  OBJECTIVE:  The patient appears well, alert, oriented x 3, in no distress. BP 116/70   Pulse 65   Temp 97.8 F (36.6 C) (Oral)   Ht 5' 8"  (1.727 m)   Wt 120 lb (54.4 kg)   SpO2 100%   BMI 18.25 kg/m      ENT normal.  Neck supple. No adenopathy or thyromegaly. PERLA. Lungs are clear, good air entry, no wheezes, rhonchi or rales. S1 and S2 normal, no murmurs, regular rate and rhythm. Abdomen soft without tenderness, guarding, mass or organomegaly. Extremities show no edema. R foot pulse is decreased compared to the L.  Neurological is normal, no focal findings.  PELVIC EXAM: VULVA: normal appearing vulva with no masses, tenderness or lesions, VAGINA: normal appearing vagina with normal color and discharge, no lesions, CERVIX: normal appearing cervix without discharge or lesions, surgically absent, UTERUS: surgically absent, vaginal cuff well healed.   UA: small blood,  trace protein   ASSESSMENT:  well woman  PLAN:  pap smear done of vaginal cuff     [name] [name] was seen today for follow-up.  Diagnoses and all orders for this visit:  Polyuria -     POCT urinalysis dipstick -     Glucose (CBG)  Wellness examination -     HgB A1c  Atypical squamous cell changes of undetermined significance (ASCUS) on vaginal cytology with positive high risk human papilloma virus (HPV) -     Cytology - PAP  Essential hypertension -     amLODipine (NORVASC) 10 MG tablet; Take 1 tablet (10 mg total) by mouth daily. -     hydrochlorothiazide (HYDRODIURIL) 12.5 MG tablet; Take 1 tablet (12.5 mg total) by  mouth daily. -     BMP8+EGFR; Future -     BMP8+EGFR  Calf cramp -     BMP8+EGFR; Future -     acetaminophen-codeine (TYLENOL #3) 300-30 MG tablet; Take 1 tablet by mouth every 8 (eight) hours as needed for moderate pain. -     BMP8+EGFR

## 2016-12-17 NOTE — Patient Instructions (Addendum)
[name] [name] was seen today for follow-up.  Diagnoses and all orders for this visit:  Polyuria -     POCT urinalysis dipstick -     Glucose (CBG)  Wellness examination -     HgB A1c  Atypical squamous cell changes of undetermined significance (ASCUS) on vaginal cytology with positive high risk human papilloma virus (HPV) -     Cytology - PAP  Essential hypertension -     amLODipine (NORVASC) 10 MG tablet; Take 1 tablet (10 mg total) by mouth daily. -     hydrochlorothiazide (HYDRODIURIL) 12.5 MG tablet; Take 1 tablet (12.5 mg total) by mouth daily. -     BMP8+EGFR; Future  Calf cramp -     BMP8+EGFR; Future -     acetaminophen-codeine (TYLENOL #3) 300-30 MG tablet; Take 1 tablet by mouth every 8 (eight) hours as needed for moderate pain.  Change norvasc to 10 mg daily Decrease HCTZ to 12.5 mg daily This will decrease urinary frequency Continue your walking program to promote good circulation, 30 minutes daily at least  Checking electrolytes for calf cramping   F/u in 6-8 weeks for calf cramps and to meet new PCP   Dr. Adrian Blackwater

## 2016-12-18 DIAGNOSIS — R3589 Other polyuria: Secondary | ICD-10-CM | POA: Insufficient documentation

## 2016-12-18 DIAGNOSIS — R358 Other polyuria: Secondary | ICD-10-CM | POA: Insufficient documentation

## 2016-12-18 LAB — BMP8+EGFR
BUN / CREAT RATIO: 18 (ref 9–23)
BUN: 17 mg/dL (ref 6–24)
CALCIUM: 9.9 mg/dL (ref 8.7–10.2)
CHLORIDE: 103 mmol/L (ref 96–106)
CO2: 23 mmol/L (ref 20–29)
CREATININE: 0.96 mg/dL (ref 0.57–1.00)
GFR, EST AFRICAN AMERICAN: 76 mL/min/{1.73_m2} (ref >59)
GFR, EST NON AFRICAN AMERICAN: 66 mL/min/{1.73_m2} (ref >59)
Glucose: 100 mg/dL — ABNORMAL HIGH (ref 65–99)
Potassium: 4.4 mmol/L (ref 3.5–5.2)
Sodium: 143 mmol/L (ref 134–144)

## 2016-12-18 NOTE — Assessment & Plan Note (Signed)
Cramping in both calves  Normal exam except slightly reduced pulse in R lower extremity  Normal BMP Tramadol short course Advised walking program

## 2016-12-18 NOTE — Assessment & Plan Note (Signed)
A: HTN well controlled. Patient has polyuria P: Reduced HCTZ from 25 to 12.5 mg daily Increase amlodipine from 5 mg to 10 mg daily

## 2016-12-18 NOTE — Assessment & Plan Note (Signed)
Repeat vaginal cuff pap done today

## 2016-12-20 ENCOUNTER — Telehealth: Payer: Self-pay

## 2016-12-20 LAB — CYTOLOGY - PAP: HPV: DETECTED — AB

## 2016-12-20 NOTE — Telephone Encounter (Signed)
Pt was called and informed of lab results. 

## 2017-01-20 MED FILL — ATORVASTATIN 10 MG TABLET: 10 | 30 days supply | Qty: 30 | Fill #2

## 2017-01-20 MED FILL — FERROUS SULFATE 325 MG TAB: 325 (65 FE) | 30 days supply | Qty: 30 | Fill #4

## 2017-01-21 ENCOUNTER — Ambulatory Visit: Payer: Medicare Other | Admitting: Internal Medicine

## 2017-02-28 ENCOUNTER — Other Ambulatory Visit: Payer: Self-pay | Admitting: Family Medicine

## 2017-02-28 DIAGNOSIS — K5180 Other ulcerative colitis without complications: Secondary | ICD-10-CM

## 2017-03-03 MED FILL — PANTOPRAZOLE SOD DR 20 MG T: 20 | 30 days supply | Qty: 60 | Fill #1

## 2017-03-03 MED FILL — sulfaSALAzine 500 MG TABS: 500 | 30 days supply | Qty: 120 | Fill #2

## 2017-03-10 ENCOUNTER — Telehealth: Payer: Self-pay | Admitting: Family Medicine

## 2017-03-10 DIAGNOSIS — K5180 Other ulcerative colitis without complications: Secondary | ICD-10-CM

## 2017-03-10 DIAGNOSIS — I10 Essential (primary) hypertension: Secondary | ICD-10-CM

## 2017-03-10 DIAGNOSIS — B20 Human immunodeficiency virus [HIV] disease: Secondary | ICD-10-CM

## 2017-03-10 MED ORDER — AMLODIPINE BESYLATE 10 MG PO TABS: 10.0000 mg | ORAL_TABLET | Freq: Every day | ORAL | 0 refills | Status: DC

## 2017-03-10 MED ORDER — HYOSCYAMINE SULFATE 0.125 MG PO TABS: 0.1250 mg | ORAL_TABLET | Freq: Four times a day (QID) | ORAL | 0 refills | Status: DC | PRN

## 2017-03-10 MED ORDER — HYDROCHLOROTHIAZIDE 12.5 MG PO TABS: 12.5000 mg | ORAL_TABLET | Freq: Every day | ORAL | 0 refills | Status: DC

## 2017-03-10 MED ORDER — ATORVASTATIN CALCIUM 10 MG PO TABS: 10.0000 mg | ORAL_TABLET | Freq: Every day | ORAL | 0 refills | Status: DC

## 2017-03-10 MED ORDER — SULFASALAZINE 500 MG PO TABS: 1000.0000 mg | ORAL_TABLET | Freq: Two times a day (BID) | ORAL | 0 refills | Status: DC

## 2017-03-10 NOTE — Telephone Encounter (Signed)
Refilled x 30 days, patient needs office visit to establish with new provider

## 2017-03-10 NOTE — Telephone Encounter (Signed)
Patient called requesting medication refill on 5 of her current medication. Please f/up

## 2017-03-28 ENCOUNTER — Other Ambulatory Visit: Payer: Self-pay | Admitting: Pharmacist

## 2017-03-28 MED ORDER — SULFASALAZINE 500 MG PO TABS: 1000.0000 mg | ORAL_TABLET | Freq: Two times a day (BID) | ORAL | 0 refills | Status: DC

## 2017-03-28 MED FILL — sulfaSALAzine 500 MG TABS: 500 | 30 days supply | Qty: 120 | Fill #0

## 2017-03-28 MED FILL — PANTOPRAZOLE SOD DR 20 MG T: 20 | 30 days supply | Qty: 60 | Fill #2

## 2017-04-01 MED FILL — ATORVASTATIN 10 MG TABLET: 10 | 30 days supply | Qty: 30 | Fill #3

## 2017-04-07 ENCOUNTER — Ambulatory Visit: Payer: Medicare Other | Attending: Internal Medicine | Admitting: Internal Medicine

## 2017-04-07 ENCOUNTER — Encounter: Payer: Self-pay | Admitting: Internal Medicine

## 2017-04-07 VITALS — BP 134/68 | HR 62 | Temp 98.2°F | Resp 16 | Wt 131.6 lb

## 2017-04-07 DIAGNOSIS — Z8379 Family history of other diseases of the digestive system: Secondary | ICD-10-CM | POA: Diagnosis not present

## 2017-04-07 DIAGNOSIS — Z90710 Acquired absence of both cervix and uterus: Secondary | ICD-10-CM | POA: Diagnosis not present

## 2017-04-07 DIAGNOSIS — Z833 Family history of diabetes mellitus: Secondary | ICD-10-CM | POA: Diagnosis not present

## 2017-04-07 DIAGNOSIS — N3946 Mixed incontinence: Secondary | ICD-10-CM | POA: Diagnosis not present

## 2017-04-07 DIAGNOSIS — Z811 Family history of alcohol abuse and dependence: Secondary | ICD-10-CM | POA: Diagnosis not present

## 2017-04-07 DIAGNOSIS — Z21 Asymptomatic human immunodeficiency virus [HIV] infection status: Secondary | ICD-10-CM | POA: Diagnosis not present

## 2017-04-07 DIAGNOSIS — R87622 Low grade squamous intraepithelial lesion on cytologic smear of vagina (LGSIL): Secondary | ICD-10-CM | POA: Diagnosis not present

## 2017-04-07 DIAGNOSIS — K5190 Ulcerative colitis, unspecified, without complications: Secondary | ICD-10-CM

## 2017-04-07 DIAGNOSIS — Z79899 Other long term (current) drug therapy: Secondary | ICD-10-CM | POA: Insufficient documentation

## 2017-04-07 DIAGNOSIS — R85619 Unspecified abnormal cytological findings in specimens from anus: Secondary | ICD-10-CM | POA: Insufficient documentation

## 2017-04-07 DIAGNOSIS — Z87891 Personal history of nicotine dependence: Secondary | ICD-10-CM | POA: Diagnosis not present

## 2017-04-07 DIAGNOSIS — I10 Essential (primary) hypertension: Secondary | ICD-10-CM | POA: Diagnosis not present

## 2017-04-07 DIAGNOSIS — R87612 Low grade squamous intraepithelial lesion on cytologic smear of cervix (LGSIL): Secondary | ICD-10-CM

## 2017-04-07 DIAGNOSIS — Z823 Family history of stroke: Secondary | ICD-10-CM | POA: Insufficient documentation

## 2017-04-07 DIAGNOSIS — E559 Vitamin D deficiency, unspecified: Secondary | ICD-10-CM | POA: Insufficient documentation

## 2017-04-07 DIAGNOSIS — E785 Hyperlipidemia, unspecified: Secondary | ICD-10-CM | POA: Insufficient documentation

## 2017-04-07 DIAGNOSIS — Z23 Encounter for immunization: Secondary | ICD-10-CM | POA: Insufficient documentation

## 2017-04-07 MED ORDER — LISINOPRIL 5 MG PO TABS: 5.0000 mg | ORAL_TABLET | Freq: Every day | ORAL | 3 refills | Status: DC

## 2017-04-07 MED ORDER — SULFASALAZINE 500 MG PO TABS: 1000.0000 mg | ORAL_TABLET | Freq: Two times a day (BID) | ORAL | 6 refills | Status: DC

## 2017-04-07 NOTE — Patient Instructions (Addendum)
Stop HCTZ. Start lisinopril 5 mg daily instead. Try doing the pelvic exercises several times a day as discussed. I have referred to to a gastroenterologist and gynecologist.   Influenza Virus Vaccine injection (Fluarix)  What is this medicine? INFLUENZA VIRUS VACCINE (in floo EN zuh VAHY ruhs vak SEEN) helps to reduce the risk of getting influenza also known as the flu. This medicine may be used for other purposes; ask your health care provider or pharmacist if you have questions. COMMON BRAND NAME(S): Fluarix, Fluzone What should I tell my health care provider before I take this medicine? They need to know if you have any of these conditions: -bleeding disorder like hemophilia -fever or infection -Guillain-Barre syndrome or other neurological problems -immune system problems -infection with the human immunodeficiency virus (HIV) or AIDS -low blood platelet counts -multiple sclerosis -an unusual or allergic reaction to influenza virus vaccine, eggs, chicken proteins, latex, gentamicin, other medicines, foods, dyes or preservatives -pregnant or trying to get pregnant -breast-feeding How should I use this medicine? This vaccine is for injection into a muscle. It is given by a health care professional. A copy of Vaccine Information Statements will be given before each vaccination. Read this sheet carefully each time. The sheet may change frequently. Talk to your pediatrician regarding the use of this medicine in children. Special care may be needed. Overdosage: If you think you have taken too much of this medicine contact a poison control center or emergency room at once. NOTE: This medicine is only for you. Do not share this medicine with others. What if I miss a dose? This does not apply. What may interact with this medicine? -chemotherapy or radiation therapy -medicines that lower your immune system like etanercept, anakinra, infliximab, and adalimumab -medicines that treat or  prevent blood clots like warfarin -phenytoin -steroid medicines like prednisone or cortisone -theophylline -vaccines This list may not describe all possible interactions. Give your health care provider a list of all the medicines, herbs, non-prescription drugs, or dietary supplements you use. Also tell them if you smoke, drink alcohol, or use illegal drugs. Some items may interact with your medicine. What should I watch for while using this medicine? Report any side effects that do not go away within 3 days to your doctor or health care professional. Call your health care provider if any unusual symptoms occur within 6 weeks of receiving this vaccine. You may still catch the flu, but the illness is not usually as bad. You cannot get the flu from the vaccine. The vaccine will not protect against colds or other illnesses that may cause fever. The vaccine is needed every year. What side effects may I notice from receiving this medicine? Side effects that you should report to your doctor or health care professional as soon as possible: -allergic reactions like skin rash, itching or hives, swelling of the face, lips, or tongue Side effects that usually do not require medical attention (report to your doctor or health care professional if they continue or are bothersome): -fever -headache -muscle aches and pains -pain, tenderness, redness, or swelling at site where injected -weak or tired This list may not describe all possible side effects. Call your doctor for medical advice about side effects. You may report side effects to FDA at 1-800-FDA-1088. Where should I keep my medicine? This vaccine is only given in a clinic, pharmacy, doctor's office, or other health care setting and will not be stored at home. NOTE: This sheet is a summary. It may not  cover all possible information. If you have questions about this medicine, talk to your doctor, pharmacist, or health care provider.  2018 Elsevier/Gold  Standard (2007-12-30 09:30:40)

## 2017-04-07 NOTE — Progress Notes (Signed)
Pt states they have done a re-call on a medication that she is taking  Pt states she urinates 10-15 times during the night

## 2017-04-07 NOTE — Progress Notes (Addendum)
Patient ID: [name] [name], female    DOB: 07-Dec-1960  MRN: [id number]  CC: re-estbalish   Subjective: [name] [name] is a 56 y.o. female who presents for chronic disease management. Last saw Dr. Adrian Blackwater 12/2016 Her concerns today include: Patient with history of HTN, HL, IDA, GERD, LGSIL with positive HPV, tobacco dependence, HIV, ulcerative colitis and calf cramps 1. C/o frequent urination at nights. Started with HCTZ 1.5 yrs ago. Dose was dec from 25 to 12.5 mg but has not made a difference -some incontinence. Wears bladder pads and Depends at nights and during the day.  -no dysuria -endorses leakage with cough, sneeze and laugh -+DM in brothers.  2. HL: tolerating Lipitor  3. Ulcerative Colitis: dx 6-7 yrs.  Does not recall when last colonoscopy In remission on Sulfasalazine -no diarrhea or blood in stools.  Good appetite. Gained 11 lbs since last visit  4. Tob dep: quit 7-8 mths ago  5. Last PAP in 12/2016 was LGSIL with + HPV. Pt had hysterectomy in past. ASCUS with HPV  In 2016. Saw GYN at the time and was told to repeat pap in 1 yr an if normal no further paps. Patient Active Problem List   Diagnosis Date Noted  . Immunization due 04/07/2017  . Polyuria 12/18/2016  . Calf cramp 12/17/2016  . HIV disease ([place]) 02/02/2015  . Cavities 02/02/2015  . Current smoker 10/27/2014  . Atypical squamous cell changes of undetermined significance (ASCUS) on vaginal cytology with positive high risk human papilloma virus (HPV) 08/29/2014  . Hyperlipidemia 08/24/2014  . Vitamin D deficiency 08/24/2014  . S/P hysterectomy 08/23/2014  . Underweight 08/23/2014  . Onychomycosis of toenail 08/23/2014  . Ulcerative colitis ([place]) 05/27/2014  . HTN (hypertension) 05/27/2014  . Anemia of chronic disease 05/27/2014  . Insomnia 05/27/2014  . Dental caries 05/10/2014  . Protein-calorie malnutrition, severe ([place]) 04/26/2014  . Cavitary lesion of lung 04/25/2014  . Abnormal CT scan, colon       Current Outpatient Prescriptions on File Prior to Visit  Medication Sig Dispense Refill  . acetaminophen-codeine (TYLENOL #3) 300-30 MG tablet Take 1 tablet by mouth every 8 (eight) hours as needed for moderate pain. 30 tablet 0  . amLODipine (NORVASC) 10 MG tablet Take 1 tablet (10 mg total) by mouth daily. 30 tablet 0  . atorvastatin (LIPITOR) 10 MG tablet Take 1 tablet (10 mg total) by mouth daily. 30 tablet 0  . calcium citrate-vitamin D 500-400 MG-UNIT chewable tablet Chew 1 tablet by mouth 2 (two) times daily. 180 tablet 1  . feeding supplement, ENSURE COMPLETE, (ENSURE COMPLETE) LIQD Take 237 mLs by mouth 3 (three) times daily between meals. 237 mL 0  . ferrous sulfate 325 (65 FE) MG tablet Take 1 tablet (325 mg total) by mouth daily with breakfast. 90 tablet 3  . GENVOYA 150-150-200-10 MG TABS tablet TAKE 1 TABLET BY MOUTH DAILY WITH BREAKFAST 30 tablet 5  . hyoscyamine (LEVSIN, ANASPAZ) 0.125 MG tablet Take 1 tablet (0.125 mg total) by mouth every 6 (six) hours as needed. 120 tablet 0   No current facility-administered medications on file prior to visit.     No Known Allergies  Social History   Social History  . Marital status: Single    Spouse name: N/A  . Number of children: N/A  . Years of education: N/A   Occupational History  . Not on file.   Social History Main Topics  . Smoking status: Former Smoker    Packs/day:  0.20    Years: 35.00    Types: Cigarettes  . Smokeless tobacco: Never Used     Comment: Smoking 2-3 cigs per day  . Alcohol use No  . Drug use: No     Comment: no drugs per patient since 2009  . Sexual activity: Yes    Birth control/ protection: Other-see comments, Surgical     Comment: pt. given condoms   Other Topics Concern  . Not on file   Social History Narrative  . No narrative on file    Family History  Problem Relation Age of Onset  . Cancer Father        ?  Marland Kitchen Alcoholism Father   . Diabetes Mother   . Stroke Mother   .  Pancreatitis Sister   . Diabetes Sister   . Cancer Paternal Uncle 73       colon     Past Surgical History:  Procedure Laterality Date  . ABDOMINAL HYSTERECTOMY  2001    done in [facility], patient unsure of indication  . TEE WITHOUT CARDIOVERSION N/A 05/02/2014   Procedure: TRANSESOPHAGEAL ECHOCARDIOGRAM (TEE);  Surgeon: Larey Dresser, MD;  Location: [place];  Service: Cardiovascular;  Laterality: N/A;  . VIDEO BRONCHOSCOPY Bilateral 04/27/2014   Procedure: VIDEO BRONCHOSCOPY WITH FLUORO;  Surgeon: Rigoberto Noel, MD;  [facility];  Service: Cardiopulmonary;  Laterality: Bilateral;    ROS: Review of Systems -neg except as above PHYSICAL EXAM: BP 134/68   Pulse 62   Temp 98.2 F (36.8 C) (Oral)   Resp 16   Wt 131 lb 9.6 oz (59.7 kg)   SpO2 100%   BMI 20.01 kg/m   Wt Readings from Last 3 Encounters:  04/07/17 131 lb 9.6 oz (59.7 kg)  12/17/16 120 lb (54.4 kg)  10/28/16 121 lb (54.9 kg)    Physical Exam  General appearance - alert, well appearing, and in no distress Mental status - alert, oriented to person, place, and time, normal mood, behavior, speech, dress, motor activity, and thought processes Eyes - pupils equal and reactive, extraocular eye movements intact Mouth - mucous membranes moist, pharynx normal without lesions Neck - supple, no significant adenopathy Chest - clear to auscultation, no wheezes, rales or rhonchi, symmetric air entry Heart - normal rate, regular rhythm, normal S1, S2, no murmurs, rubs, clicks or gallops Extremities - peripheral pulses normal, no pedal edema, no clubbing or cyanosis  ASSESSMENT AND PLAN: 1. Urinary incontinence, mixed -worsen by HCTZ. Stop HCTZ -discussed Kegals  2. Essential hypertension At goal. Change HCTZ to Lisinopril - CBC - Comprehensive metabolic panel - lisinopril (PRINIVIL,ZESTRIL) 5 MG tablet; Take 1 tablet (5 mg total) by mouth daily.  Dispense: 90 tablet; Refill: 3  3.  Hyperlipidemia, unspecified hyperlipidemia type Continue Lipitor  4. Ulcerative colitis without complications, unspecified location ([place]) -in remission. Will get her establish with GI for monitoring and screening colonoscopy. Pt informed that after having ds for 8 yrs she needs colonoscopy Q 1-2 yrs if ds involved more than rectalsigmoid - Ambulatory referral to Gastroenterology - sulfaSALAzine (AZULFIDINE) 500 MG tablet; Take 2 tablets (1,000 mg total) by mouth 2 (two) times daily.  Dispense: 120 tablet; Refill: 6  5. Abnormal Pap smear LGSIL - Ambulatory referral to Gynecology  6. Need for influenza vaccination - Flu Vaccine QUAD 6+ mos PF IM (Fluarix Quad PF)  7. Former smoker   Patient was given the opportunity to ask questions.  Patient verbalized understanding of the plan  and was able to repeat key elements of the plan.   Orders Placed This Encounter  Procedures  . Flu Vaccine QUAD 6+ mos PF IM (Fluarix Quad PF)  . CBC  . Comprehensive metabolic panel  . Ambulatory referral to Gastroenterology  . Ambulatory referral to Gynecology     Requested Prescriptions   Signed Prescriptions Disp Refills  . lisinopril (PRINIVIL,ZESTRIL) 5 MG tablet 90 tablet 3    Sig: Take 1 tablet (5 mg total) by mouth daily.  Marland Kitchen sulfaSALAzine (AZULFIDINE) 500 MG tablet 120 tablet 6    Sig: Take 2 tablets (1,000 mg total) by mouth 2 (two) times daily.    Return in about 3 months (around 07/08/2017).  Karle Plumber, MD, FACP

## 2017-04-08 LAB — COMPREHENSIVE METABOLIC PANEL
ALBUMIN: 4.5 g/dL (ref 3.5–5.5)
ALK PHOS: 715 IU/L — AB (ref 39–117)
ALT: 49 IU/L — ABNORMAL HIGH (ref 0–32)
AST: 67 IU/L — AB (ref 0–40)
Albumin/Globulin Ratio: 1.2 (ref 1.2–2.2)
BILIRUBIN TOTAL: 0.4 mg/dL (ref 0.0–1.2)
BUN / CREAT RATIO: 15 (ref 9–23)
BUN: 14 mg/dL (ref 6–24)
CHLORIDE: 102 mmol/L (ref 96–106)
CO2: 26 mmol/L (ref 20–29)
Calcium: 9.9 mg/dL (ref 8.7–10.2)
Creatinine, Ser: 0.92 mg/dL (ref 0.57–1.00)
GFR calc Af Amer: 79 mL/min/{1.73_m2} (ref >59)
GFR calc non Af Amer: 69 mL/min/{1.73_m2} (ref >59)
GLOBULIN, TOTAL: 3.9 g/dL (ref 1.5–4.5)
Glucose: 87 mg/dL (ref 65–99)
Potassium: 3.8 mmol/L (ref 3.5–5.2)
SODIUM: 141 mmol/L (ref 134–144)
Total Protein: 8.4 g/dL (ref 6.0–8.5)

## 2017-04-08 LAB — CBC
HEMOGLOBIN: 10.3 g/dL — AB (ref 11.1–15.9)
Hematocrit: 30.1 % — ABNORMAL LOW (ref 34.0–46.6)
MCH: 31.5 pg (ref 26.6–33.0)
MCHC: 34.2 g/dL (ref 31.5–35.7)
MCV: 92 fL (ref 79–97)
Platelets: 373 10*3/uL (ref 150–379)
RBC: 3.27 x10E6/uL — ABNORMAL LOW (ref 3.77–5.28)
RDW: 16.3 % — AB (ref 12.3–15.4)
WBC: 5 10*3/uL (ref 3.4–10.8)

## 2017-04-09 ENCOUNTER — Telehealth: Payer: Self-pay | Admitting: Internal Medicine

## 2017-04-09 NOTE — Telephone Encounter (Signed)
PC placed to pt this a.m to discuss lab results. Pt answered and I identified myself and told her that I was calling to discuss lab results. The call was dropped. I tried calling back several times but went to voicemail. I will send a letter. 1. Anemia: H/H slightly lower. I wanted to confirm that she is taking iron. I have referred her to GI for colonoscopy and management of ulcerative colitis per our conversation on last visit.  2. Elevated LFTs and Alk Phos chronic from last yr. Looking back through her chart, there was a dx of Hep C mentioned in 2015. Will have her return to lab for hep C testing. Also she had liver bx 03/2014 through [facility] system (review of Care Everywhere). 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 Will need to have this addressed when she sees GI.  Results for orders placed or performed in visit on 04/07/17  CBC  Result Value Ref Range   WBC 5.0 3.4 - 10.8 x10E3/uL   RBC 3.27 (L) 3.77 - 5.28 x10E6/uL   Hemoglobin 10.3 (L) 11.1 - 15.9 g/dL   Hematocrit 30.1 (L) 34.0 - 46.6 %   MCV 92 79 - 97 fL   MCH 31.5 26.6 - 33.0 pg   MCHC 34.2 31.5 - 35.7 g/dL   RDW 16.3 (H) 12.3 - 15.4 %   Platelets 373 150 - 379 x10E3/uL  Comprehensive metabolic panel  Result Value Ref Range   Glucose 87 65 - 99 mg/dL   BUN 14 6 - 24 mg/dL   Creatinine, Ser 0.92 0.57 - 1.00 mg/dL   GFR calc non Af Amer 69 >59 mL/min/1.73   GFR calc Af Amer 79 >59 mL/min/1.73   BUN/Creatinine Ratio 15 9 - 23   Sodium 141 134 - 144 mmol/L   Potassium 3.8 3.5 - 5.2 mmol/L   Chloride 102 96 - 106 mmol/L   CO2 26 20 - 29 mmol/L   Calcium 9.9 8.7 - 10.2 mg/dL   Total Protein 8.4 6.0 - 8.5 g/dL   Albumin 4.5 3.5 - 5.5 g/dL   Globulin, Total 3.9 1.5 - 4.5 g/dL   Albumin/Globulin Ratio 1.2 1.2 - 2.2   Bilirubin Total 0.4 0.0 - 1.2 mg/dL   Alkaline Phosphatase 715 (H) 39 - 117 IU/L   AST 67 (H) 0 - 40 IU/L   ALT 49 (H) 0 - 32 IU/L

## 2017-04-14 NOTE — Addendum Note (Signed)
Addended by: Karle Plumber B on: 04/14/2017 08:31 PM   Modules accepted: Orders

## 2017-04-28 MED FILL — ATORVASTATIN 10 MG TABLET: 10 | 30 days supply | Qty: 30 | Fill #4

## 2017-04-28 MED FILL — sulfaSALAzine 500 MG TABS: 500 | 30 days supply | Qty: 120 | Fill #0

## 2017-04-30 ENCOUNTER — Ambulatory Visit: Payer: Medicaid Other | Admitting: Infectious Disease

## 2017-04-30 ENCOUNTER — Ambulatory Visit: Payer: Medicare Other | Admitting: Infectious Disease

## 2017-05-19 ENCOUNTER — Ambulatory Visit (INDEPENDENT_AMBULATORY_CARE_PROVIDER_SITE_OTHER): Payer: Medicare Other | Admitting: Infectious Disease

## 2017-05-19 VITALS — BP 127/76 | HR 99 | Temp 98.7°F | Wt 132.0 lb

## 2017-05-19 DIAGNOSIS — B20 Human immunodeficiency virus [HIV] disease: Secondary | ICD-10-CM | POA: Diagnosis not present

## 2017-05-19 DIAGNOSIS — Z79899 Other long term (current) drug therapy: Secondary | ICD-10-CM | POA: Diagnosis not present

## 2017-05-19 DIAGNOSIS — Z113 Encounter for screening for infections with a predominantly sexual mode of transmission: Secondary | ICD-10-CM

## 2017-05-19 DIAGNOSIS — E785 Hyperlipidemia, unspecified: Secondary | ICD-10-CM

## 2017-05-19 DIAGNOSIS — K5180 Other ulcerative colitis without complications: Secondary | ICD-10-CM | POA: Diagnosis not present

## 2017-05-19 DIAGNOSIS — I10 Essential (primary) hypertension: Secondary | ICD-10-CM

## 2017-05-19 NOTE — Progress Notes (Signed)
Chief complaint: Follow-up for HIV on medications and concerned about a TV commercial she thought that was apparently advertising the carcinogenic effects of amlodipine and soliciting a law suit Subjective:    Patient ID: [name] [name], female    DOB: 1961/04/05, 56 y.o.   MRN: [id number]  HPI  This is a 56 year-old African-American lady with HIV, ulcerative colitis hypertension on GENVOYA for HIV and doing perfectly with re to virological suppression. She is happy she now has Medicare and Medicaid and is I believe on sulfadiazene for her UC.  She comes in the clinic for routine follow-up for HIV.  She has not had labs since May but has maintained perfect virological suppression.  She has had concerns after watching a television commercial which was apparently claiming that amlodipine was responsible for various cancers.  I reviewed the material available and up-to-date on prescribing information for amlodipine and such warnings in my initial read of prescribers information.  I am certainly never heard of such a claim.  We reviewed the most common side effects with amlodipine which include edema and she is not suffering from any of those.  Her blood pressure actually is quite well controlled and I encouraged her to continue on her ACE inhibitor and her amlodipine.  Lab Results  Component Value Date   HIV1RNAQUANT <20 NOT DETECTED 10/28/2016   HIV1RNAQUANT <20 06/19/2016   HIV1RNAQUANT <20 12/26/2015    Lab Results  Component Value Date   CD4TABS 390 (L) 10/28/2016   CD4TABS 610 06/19/2016   CD4TABS 550 12/26/2015   Past Medical History:  Diagnosis Date  . Arthritis   . Cavities 02/02/2015  . GERD (gastroesophageal reflux disease) Dx 2014  . GI bleed   . HIV (human immunodeficiency virus infection) ([place]) Dx 2015  . Hypertension Dx 2014  . Substance abuse ([place])    Quit 2008  . Ulcerative colitis [facility])     Past Surgical History:  Procedure Laterality Date  . ABDOMINAL HYSTERECTOMY   2001    done in [facility], patient unsure of indication  . TEE WITHOUT CARDIOVERSION N/A 05/02/2014   Procedure: TRANSESOPHAGEAL ECHOCARDIOGRAM (TEE);  Surgeon: Larey Dresser, MD;  Location: [place];  Service: Cardiovascular;  Laterality: N/A;  . VIDEO BRONCHOSCOPY Bilateral 04/27/2014   Procedure: VIDEO BRONCHOSCOPY WITH FLUORO;  Surgeon: Rigoberto Noel, MD;  [facility];  Service: Cardiopulmonary;  Laterality: Bilateral;    Family History  Problem Relation Age of Onset  . Cancer Father        ?  Marland Kitchen Alcoholism Father   . Diabetes Mother   . Stroke Mother   . Pancreatitis Sister   . Diabetes Sister   . Cancer Paternal Uncle 63       colon       Social History   Socioeconomic History  . Marital status: Single    Spouse name: Not on file  . Number of children: Not on file  . Years of education: Not on file  . Highest education level: Not on file  Social Needs  . Financial resource strain: Not on file  . Food insecurity - worry: Not on file  . Food insecurity - inability: Not on file  . Transportation needs - medical: Not on file  . Transportation needs - non-medical: Not on file  Occupational History  . Not on file  Tobacco Use  . Smoking status: Former Smoker    Packs/day: 0.20    Years: 35.00  Pack years: 7.00    Types: Cigarettes  . Smokeless tobacco: Never Used  . Tobacco comment: Smoking 2-3 cigs per day  Substance and Sexual Activity  . Alcohol use: No    Alcohol/week: 0.0 oz  . Drug use: No    Comment: no drugs per patient since 2009  . Sexual activity: Yes    Birth control/protection: Other-see comments, Surgical    Comment: pt. given condoms  Other Topics Concern  . Not on file  Social History Narrative  . Not on file    No Known Allergies   Current Outpatient Medications:  .  acetaminophen-codeine (TYLENOL #3) 300-30 MG tablet, Take 1 tablet by mouth every 8 (eight) hours as needed for moderate pain., Disp: 30 tablet,  Rfl: 0 .  amLODipine (NORVASC) 10 MG tablet, Take 1 tablet (10 mg total) by mouth daily., Disp: 30 tablet, Rfl: 0 .  atorvastatin (LIPITOR) 10 MG tablet, Take 1 tablet (10 mg total) by mouth daily., Disp: 30 tablet, Rfl: 0 .  calcium citrate-vitamin D 500-400 MG-UNIT chewable tablet, Chew 1 tablet by mouth 2 (two) times daily., Disp: 180 tablet, Rfl: 1 .  feeding supplement, ENSURE COMPLETE, (ENSURE COMPLETE) LIQD, Take 237 mLs by mouth 3 (three) times daily between meals., Disp: 237 mL, Rfl: 0 .  ferrous sulfate 325 (65 FE) MG tablet, Take 1 tablet (325 mg total) by mouth daily with breakfast., Disp: 90 tablet, Rfl: 3 .  GENVOYA 150-150-200-10 MG TABS tablet, TAKE 1 TABLET BY MOUTH DAILY WITH BREAKFAST, Disp: 30 tablet, Rfl: 5 .  hyoscyamine (LEVSIN, ANASPAZ) 0.125 MG tablet, Take 1 tablet (0.125 mg total) by mouth every 6 (six) hours as needed., Disp: 120 tablet, Rfl: 0 .  lisinopril (PRINIVIL,ZESTRIL) 5 MG tablet, Take 1 tablet (5 mg total) by mouth daily., Disp: 90 tablet, Rfl: 3 .  sulfaSALAzine (AZULFIDINE) 500 MG tablet, Take 2 tablets (1,000 mg total) by mouth 2 (two) times daily., Disp: 120 tablet, Rfl: 6   Review of Systems  Constitutional: Negative for activity change, appetite change, chills, diaphoresis, fatigue, fever and unexpected weight change.  HENT: Negative for congestion, rhinorrhea, sinus pressure, sneezing, sore throat and trouble swallowing.   Eyes: Negative for photophobia and visual disturbance.  Respiratory: Negative for cough, shortness of breath, wheezing and stridor.   Cardiovascular: Negative for chest pain, palpitations and leg swelling.  Gastrointestinal: Negative for abdominal distention, abdominal pain, anal bleeding, blood in stool, constipation, diarrhea, nausea and vomiting.  Genitourinary: Negative for difficulty urinating, dysuria, flank pain and hematuria.  Musculoskeletal: Negative for arthralgias, back pain, gait problem, joint swelling and myalgias.    Skin: Negative for color change, pallor, rash and wound.  Neurological: Negative for dizziness, tremors, weakness and light-headedness.  Hematological: Negative for adenopathy. Does not bruise/bleed easily.  Psychiatric/Behavioral: Negative for agitation, behavioral problems, confusion, decreased concentration, dysphoric mood and sleep disturbance.       Objective:   Physical Exam  Constitutional: She is oriented to person, place, and time. She appears well-developed and well-nourished. No distress.  HENT:  Head: Normocephalic and atraumatic.  Mouth/Throat: No oropharyngeal exudate.  Eyes: EOM are normal. No scleral icterus.  Neck: Normal range of motion. Neck supple.  Cardiovascular: Normal rate and regular rhythm.  Pulmonary/Chest: Effort normal. No respiratory distress. She has no wheezes.  Abdominal: She exhibits no distension.  Musculoskeletal: She exhibits no edema or tenderness.  Neurological: She is alert and oriented to person, place, and time. She exhibits normal muscle tone. Coordination normal.  Skin: Skin is warm and dry. No rash noted. She is not diaphoretic. No erythema. No pallor.  Psychiatric: She has a normal mood and affect. Her behavior is normal. Judgment and thought content normal.  Nursing note and vitals reviewed.         Assessment & Plan:   HIV: Continue GENVOYA, check labs today.  We again discussed changing her to [facility]  in the future to minimize drug drug interactions  Hypertension :  Well controlled Vitals:   05/19/17 1002  BP: 127/76  Pulse: 99  Temp: 98.7 F (37.1 C)   UC: Sulfasalazine and well controlled  Hyperlipidemia:   Lipid Panel  LDL was up in May. Is he taking her statin? This seems not much changed. Will route comment to PCP    Component Value Date/Time   CHOL 249 (H) 10/28/2016 1006   TRIG 79 10/28/2016 1006   HDL 77 10/28/2016 1006   CHOLHDL 3.2 10/28/2016 1006   VLDL 16 10/28/2016 1006   LDLCALC 156 (H) 10/28/2016  1006    I spent greater than 25 minutes with the patient including greater than 50% of time in face to face counsel of the patient and in coordination of her care including 2 of all of her pertinent labs from May review of her adherence to her regimen and review of potential reasons to switch to a non-boosted integrate strand transfer inhibitor based regimen.

## 2017-05-20 LAB — T-HELPER CELL (CD4) - (RCID CLINIC ONLY)
CD4 % Helper T Cell: 20 % — ABNORMAL LOW (ref 33–55)
CD4 T Cell Abs: 490 /uL (ref 400–2700)

## 2017-05-21 ENCOUNTER — Ambulatory Visit: Payer: Medicare Other | Admitting: Medical

## 2017-05-21 LAB — HIV-1 RNA QUANT-NO REFLEX-BLD
HIV 1 RNA Quant: <20 copies/mL
HIV-1 RNA Quant, Log: <1.3 Log copies/mL

## 2017-05-27 ENCOUNTER — Other Ambulatory Visit: Payer: Self-pay | Admitting: Internal Medicine

## 2017-05-27 DIAGNOSIS — B20 Human immunodeficiency virus [HIV] disease: Secondary | ICD-10-CM

## 2017-05-27 DIAGNOSIS — D638 Anemia in other chronic diseases classified elsewhere: Secondary | ICD-10-CM

## 2017-06-13 ENCOUNTER — Ambulatory Visit: Payer: Medicare Other | Admitting: Obstetrics & Gynecology

## 2017-06-13 DIAGNOSIS — R87622 Low grade squamous intraepithelial lesion on cytologic smear of vagina (LGSIL): Secondary | ICD-10-CM | POA: Insufficient documentation

## 2017-06-13 LAB — POCT URINALYSIS DIP (DEVICE)
Bilirubin Urine: NEGATIVE
Glucose, UA: NEGATIVE mg/dL
Hgb urine dipstick: NEGATIVE
Ketones, ur: NEGATIVE mg/dL
Leukocytes, UA: NEGATIVE
NITRITE: NEGATIVE
PH: 6 (ref 5.0–8.0)
PROTEIN: NEGATIVE mg/dL
Specific Gravity, Urine: 1.025 (ref 1.005–1.030)
UROBILINOGEN UA: 0.2 mg/dL (ref 0.0–1.0)

## 2017-06-13 LAB — POCT PREGNANCY, URINE: PREG TEST UR: NEGATIVE

## 2017-06-13 NOTE — Progress Notes (Signed)
Patient's appointment was delayed due to late preceeding appointments. She was unable to stay due to this delay. Procedure was rescheduled.  Verita Schneiders, MD, [place] for [employer], [place]

## 2017-06-19 MED FILL — ATORVASTATIN 10 MG TABLET: 10 | 30 days supply | Qty: 30 | Fill #5

## 2017-06-26 ENCOUNTER — Other Ambulatory Visit: Payer: Self-pay

## 2017-06-26 ENCOUNTER — Other Ambulatory Visit: Payer: Self-pay | Admitting: Internal Medicine

## 2017-06-26 DIAGNOSIS — B20 Human immunodeficiency virus [HIV] disease: Secondary | ICD-10-CM

## 2017-06-26 DIAGNOSIS — K5180 Other ulcerative colitis without complications: Secondary | ICD-10-CM

## 2017-06-26 MED ORDER — HYOSCYAMINE SULFATE 0.125 MG PO TABS: 0.1250 mg | ORAL_TABLET | Freq: Four times a day (QID) | ORAL | 0 refills | Status: DC | PRN

## 2017-06-30 ENCOUNTER — Encounter: Payer: Self-pay | Admitting: Pharmacist

## 2017-06-30 NOTE — Progress Notes (Signed)
PA submitted for hyoscyamine. Pending review by [facility]

## 2017-07-10 ENCOUNTER — Encounter: Payer: Self-pay | Admitting: Internal Medicine

## 2017-07-10 ENCOUNTER — Ambulatory Visit: Payer: Medicare Other | Attending: Internal Medicine | Admitting: Internal Medicine

## 2017-07-10 VITALS — BP 109/66 | HR 74 | Temp 98.6°F | Resp 16 | Ht 67.0 in | Wt 128.4 lb

## 2017-07-10 DIAGNOSIS — R896 Abnormal cytological findings in specimens from other organs, systems and tissues: Secondary | ICD-10-CM | POA: Diagnosis not present

## 2017-07-10 DIAGNOSIS — K219 Gastro-esophageal reflux disease without esophagitis: Secondary | ICD-10-CM | POA: Diagnosis not present

## 2017-07-10 DIAGNOSIS — B20 Human immunodeficiency virus [HIV] disease: Secondary | ICD-10-CM | POA: Diagnosis not present

## 2017-07-10 DIAGNOSIS — R87612 Low grade squamous intraepithelial lesion on cytologic smear of cervix (LGSIL): Secondary | ICD-10-CM

## 2017-07-10 DIAGNOSIS — E43 Unspecified severe protein-calorie malnutrition: Secondary | ICD-10-CM | POA: Insufficient documentation

## 2017-07-10 DIAGNOSIS — D638 Anemia in other chronic diseases classified elsewhere: Secondary | ICD-10-CM | POA: Insufficient documentation

## 2017-07-10 DIAGNOSIS — R87810 Cervical high risk human papillomavirus (HPV) DNA test positive: Secondary | ICD-10-CM | POA: Insufficient documentation

## 2017-07-10 DIAGNOSIS — Z90710 Acquired absence of both cervix and uterus: Secondary | ICD-10-CM | POA: Diagnosis not present

## 2017-07-10 DIAGNOSIS — Z79899 Other long term (current) drug therapy: Secondary | ICD-10-CM | POA: Insufficient documentation

## 2017-07-10 DIAGNOSIS — I10 Essential (primary) hypertension: Secondary | ICD-10-CM | POA: Diagnosis not present

## 2017-07-10 DIAGNOSIS — R87622 Low grade squamous intraepithelial lesion on cytologic smear of vagina (LGSIL): Secondary | ICD-10-CM | POA: Insufficient documentation

## 2017-07-10 DIAGNOSIS — Z87891 Personal history of nicotine dependence: Secondary | ICD-10-CM | POA: Diagnosis not present

## 2017-07-10 DIAGNOSIS — K5190 Ulcerative colitis, unspecified, without complications: Secondary | ICD-10-CM | POA: Insufficient documentation

## 2017-07-10 DIAGNOSIS — E785 Hyperlipidemia, unspecified: Secondary | ICD-10-CM | POA: Diagnosis not present

## 2017-07-10 DIAGNOSIS — K029 Dental caries, unspecified: Secondary | ICD-10-CM | POA: Insufficient documentation

## 2017-07-10 DIAGNOSIS — E559 Vitamin D deficiency, unspecified: Secondary | ICD-10-CM | POA: Insufficient documentation

## 2017-07-10 DIAGNOSIS — K743 Primary biliary cirrhosis: Secondary | ICD-10-CM | POA: Diagnosis not present

## 2017-07-10 NOTE — Patient Instructions (Signed)
Please keep appointment with the gynecologist. Please follow through in making appointment with the gastroenterologist.  Let me know if we need to submit a referral to the doctor you chose.

## 2017-07-10 NOTE — Progress Notes (Signed)
Patient ID: [name] [name], female    DOB: 1961-01-24  MRN: [id number]  CC: Follow-up (3 month)   Subjective: [name] [name] is a 57 y.o. female who presents for chronic ds management. Her concerns today include:  Patient with history of HTN, HL, IDA, GERD, LGSIL with positive HPV, former tobacco, HIV, ulcerative colitis and calf cramps  1.  Abnormal PAP: GYN appt rescheduled for 07/16/2017.  2.  GI: going to GI speciality gp in [facility] today to schedule appt with the GI whom she had seen in the past.  Needs to be seen for hx of Ulcerative Colitis (needs c-scope for screening) and PDC (liver bx 03/2014 at [facility] as seen in [place]) -denies diarrhea or blood in stools  3.  HTN:  Doing okay on Lisinopril. No CP/SOB/LE edmea Walks almost daily for exercise.  4.  Tob:  7 mth clean of cigarettes  5.  HIV: Followed by Dr. Tommy [name]. viral level surpressed.  - Patient Active Problem List   Diagnosis Date Noted  . LGSIL Pap smear of vagina 06/13/2017  . Immunization due 04/07/2017  . Abnormal Pap smear of anus 04/07/2017  . Calf cramp 12/17/2016  . HIV disease ([place]) 02/02/2015  . Current smoker 10/27/2014  . Hyperlipidemia 08/24/2014  . Vitamin D deficiency 08/24/2014  . S/P hysterectomy 08/23/2014  . Underweight 08/23/2014  . Ulcerative colitis ([place]) 05/27/2014  . HTN (hypertension) 05/27/2014  . Anemia of chronic disease 05/27/2014  . Insomnia 05/27/2014  . Dental caries 05/10/2014  . Protein-calorie malnutrition, severe ([place]) 04/26/2014  . Cavitary lesion of lung 04/25/2014     Current Outpatient Medications on File Prior to Visit  Medication Sig Dispense Refill  . amLODipine (NORVASC) 10 MG tablet Take 1 tablet (10 mg total) by mouth daily. 30 tablet 0  . atorvastatin (LIPITOR) 10 MG tablet Take 1 tablet (10 mg total) by mouth daily. 30 tablet 0  . calcium citrate-vitamin D 500-400 MG-UNIT chewable tablet Chew 1 tablet by mouth 2 (two) times daily. 180  tablet 1  . GENVOYA 150-150-200-10 MG TABS tablet TAKE 1 TABLET BY MOUTH DAILY WITH BREAKFAST 30 tablet 5  . hyoscyamine (LEVSIN, ANASPAZ) 0.125 MG tablet Take 1 tablet (0.125 mg total) by mouth every 6 (six) hours as needed. 120 tablet 0  . lisinopril (PRINIVIL,ZESTRIL) 5 MG tablet Take 1 tablet (5 mg total) by mouth daily. 90 tablet 3  . sulfaSALAzine (AZULFIDINE) 500 MG tablet Take 2 tablets (1,000 mg total) by mouth 2 (two) times daily. 120 tablet 6  . acetaminophen-codeine (TYLENOL #3) 300-30 MG tablet Take 1 tablet by mouth every 8 (eight) hours as needed for moderate pain. (Patient not taking: Reported on 07/10/2017) 30 tablet 0  . feeding supplement, ENSURE COMPLETE, (ENSURE COMPLETE) LIQD Take 237 mLs by mouth 3 (three) times daily between meals. (Patient not taking: Reported on 07/10/2017) 237 mL 0  . FEROSUL 325 (65 Fe) MG tablet TAKE 1 TABLET BY MOUTH DAILY WITH BREAKFAST. (Patient not taking: Reported on 07/10/2017) 90 tablet 3   No current facility-administered medications on file prior to visit.     No Known Allergies  Social History   Socioeconomic History  . Marital status: Single    Spouse name: Not on file  . Number of children: Not on file  . Years of education: Not on file  . Highest education level: Not on file  Social Needs  . Financial resource strain: Not on file  . Food  insecurity - worry: Not on file  . Food insecurity - inability: Not on file  . Transportation needs - medical: Not on file  . Transportation needs - non-medical: Not on file  Occupational History  . Not on file  Tobacco Use  . Smoking status: Former Smoker    Packs/day: 0.20    Years: 35.00    Pack years: 7.00    Types: Cigarettes  . Smokeless tobacco: Never Used  . Tobacco comment: Smoking 2-3 cigs per day  Substance and Sexual Activity  . Alcohol use: No    Alcohol/week: 0.0 oz  . Drug use: No    Comment: no drugs per patient since 2009  . Sexual activity: Yes    Birth  control/protection: Other-see comments, Surgical    Comment: pt. given condoms  Other Topics Concern  . Not on file  Social History Narrative  . Not on file    Family History  Problem Relation Age of Onset  . Cancer Father        ?  Marland Kitchen Alcoholism Father   . Diabetes Mother   . Stroke Mother   . Pancreatitis Sister   . Diabetes Sister   . Cancer Paternal Uncle 37       colon     Past Surgical History:  Procedure Laterality Date  . ABDOMINAL HYSTERECTOMY  2001    done in [facility], patient unsure of indication  . TEE WITHOUT CARDIOVERSION N/A 05/02/2014   Procedure: TRANSESOPHAGEAL ECHOCARDIOGRAM (TEE);  Surgeon: Larey Dresser, MD;  Location: [place];  Service: Cardiovascular;  Laterality: N/A;  . VIDEO BRONCHOSCOPY Bilateral 04/27/2014   Procedure: VIDEO BRONCHOSCOPY WITH FLUORO;  Surgeon: Rigoberto Noel, MD;  [facility];  Service: Cardiopulmonary;  Laterality: Bilateral;    ROS: Review of Systems  Genitourinary: Negative for difficulty urinating and hematuria.  Psychiatric/Behavioral: Negative for dysphoric mood.    PHYSICAL EXAM: BP 109/66   Pulse 74   Temp 98.6 F (37 C) (Oral)   Resp 16   Ht 5' 7"  (1.702 m)   Wt 128 lb 6.4 oz (58.2 kg)   SpO2 99%   BMI 20.11 kg/m   Wt Readings from Last 3 Encounters:  07/10/17 128 lb 6.4 oz (58.2 kg)  05/19/17 132 lb (59.9 kg)  04/07/17 131 lb 9.6 oz (59.7 kg)    Physical Exam  General appearance - alert, well appearing, and in no distress Mental status - alert, oriented to person, place, and time, normal mood, behavior, speech, dress, motor activity, and thought processes Neck - supple, no significant adenopathy Chest - clear to auscultation, no wheezes, rales or rhonchi, symmetric air entry Heart - normal rate, regular rhythm, normal S1, S2, no murmurs, rubs, clicks or gallops Extremities - peripheral pulses normal, no pedal edema, no clubbing or cyanosis  ASSESSMENT AND PLAN: 1.  Essential hypertension At goal.  Continue amlodipine and lisinopril  2. Hyperlipidemia, unspecified hyperlipidemia type Continue Lipitor  3. Ulcerative colitis without complications, unspecified location ([place]) 4. Primary biliary cirrhosis [facility]) Patient plans to make appointment with gastroenterology group in [facility].  She does not recall the name at this time, but will let me know once she calls,  if she will need a referral from me  5. Low grade squamous intraepithelial lesion on cytologic smear of cervix (LGSIL) Keep GYN appointment later this month  6. HIV disease ([place]) Followed by ID  Patient was given the opportunity to ask questions.  Patient verbalized understanding  of the plan and was able to repeat key elements of the plan.   No orders of the defined types were placed in this encounter.    Requested Prescriptions    No prescriptions requested or ordered in this encounter    Return in about 4 months (around 11/07/2017).  Karle Plumber, MD, FACP

## 2017-07-16 ENCOUNTER — Ambulatory Visit (INDEPENDENT_AMBULATORY_CARE_PROVIDER_SITE_OTHER): Payer: Medicare Other | Admitting: Obstetrics & Gynecology

## 2017-07-16 ENCOUNTER — Encounter: Payer: Self-pay | Admitting: Obstetrics & Gynecology

## 2017-07-16 VITALS — BP 123/73 | HR 88 | Ht 68.0 in | Wt 124.9 lb

## 2017-07-16 DIAGNOSIS — Z3202 Encounter for pregnancy test, result negative: Secondary | ICD-10-CM | POA: Diagnosis not present

## 2017-07-16 DIAGNOSIS — R87622 Low grade squamous intraepithelial lesion on cytologic smear of vagina (LGSIL): Secondary | ICD-10-CM

## 2017-07-16 LAB — POCT PREGNANCY, URINE: PREG TEST UR: NEGATIVE

## 2017-07-16 NOTE — Progress Notes (Signed)
    GYNECOLOGY CLINIC COLPOSCOPY PROCEDURE NOTE  57 y.o. G2P1011 here for colposcopy for low-grade squamous intraepithelial neoplasia (LGSIL - encompassing HPV,mild dysplasia,CIN I) vaginal pap smear on 12/17/2016. Patient has a history of HIV, and hysterectomy. Had abnormal LGSIL pap before, negative colposcopy.  Discussed need for surveillance.  Patient given informed consent, signed copy in the chart, time out was performed.  Placed in lithotomy position. Vaginal cuff viewed with speculum after application of Lugol's solution  Colposcopy adequate? Yes No areas of non-Lugol uptake seen. All instruments removed from vagina.    Will continue surveillance with yearly pap smears. No further intervention needed now.    Verita Schneiders, MD, [place] for [employer], [place]

## 2017-07-16 NOTE — Patient Instructions (Signed)
Return to clinic for any scheduled appointments or for any gynecologic concerns as needed.   

## 2017-07-17 ENCOUNTER — Other Ambulatory Visit: Payer: Self-pay | Admitting: Internal Medicine

## 2017-07-17 DIAGNOSIS — B20 Human immunodeficiency virus [HIV] disease: Secondary | ICD-10-CM

## 2017-07-18 ENCOUNTER — Other Ambulatory Visit: Payer: Self-pay

## 2017-07-18 DIAGNOSIS — B20 Human immunodeficiency virus [HIV] disease: Secondary | ICD-10-CM

## 2017-07-18 MED ORDER — ATORVASTATIN CALCIUM 10 MG PO TABS: 10.0000 mg | ORAL_TABLET | Freq: Every day | ORAL | 3 refills | Status: DC

## 2017-07-22 ENCOUNTER — Encounter: Payer: Self-pay | Admitting: *Deleted

## 2017-07-28 ENCOUNTER — Other Ambulatory Visit: Payer: Self-pay | Admitting: Internal Medicine

## 2017-07-28 DIAGNOSIS — B20 Human immunodeficiency virus [HIV] disease: Secondary | ICD-10-CM

## 2017-07-28 MED FILL — sulfaSALAzine 500 MG TABS: 500 | 30 days supply | Qty: 120 | Fill #1

## 2017-08-01 MED FILL — ATORVASTATIN 10 MG TABLET: 10 | 30 days supply | Qty: 30 | Fill #0

## 2017-08-25 MED FILL — sulfaSALAzine 500 MG TABS: 500 | 30 days supply | Qty: 120 | Fill #2

## 2017-08-25 MED FILL — ATORVASTATIN 10 MG TABLET: 10 | 30 days supply | Qty: 30 | Fill #1

## 2017-09-24 MED FILL — sulfaSALAzine 500 MG TABS: 500 | 30 days supply | Qty: 120 | Fill #3

## 2017-09-24 MED FILL — FERROUS SULFATE 325 MG TAB: 325 (65 FE) | 90 days supply | Qty: 90 | Fill #0

## 2017-09-24 MED FILL — ATORVASTATIN 10 MG TABLET: 10 | 30 days supply | Qty: 30 | Fill #2

## 2017-10-29 MED FILL — ATORVASTATIN 10 MG TABLET: 10 | 30 days supply | Qty: 30 | Fill #3

## 2017-10-30 ENCOUNTER — Other Ambulatory Visit: Payer: Self-pay

## 2017-10-30 DIAGNOSIS — B20 Human immunodeficiency virus [HIV] disease: Secondary | ICD-10-CM

## 2017-10-30 MED ORDER — ATORVASTATIN CALCIUM 10 MG PO TABS: 10.0000 mg | ORAL_TABLET | Freq: Every day | ORAL | 0 refills | Status: DC

## 2017-11-07 ENCOUNTER — Ambulatory Visit: Payer: Medicare Other | Attending: Internal Medicine | Admitting: Internal Medicine

## 2017-11-07 ENCOUNTER — Encounter: Payer: Self-pay | Admitting: Internal Medicine

## 2017-11-07 VITALS — BP 122/72 | HR 62 | Temp 98.2°F | Resp 16 | Wt 121.6 lb

## 2017-11-07 DIAGNOSIS — Z87891 Personal history of nicotine dependence: Secondary | ICD-10-CM | POA: Diagnosis not present

## 2017-11-07 DIAGNOSIS — K743 Primary biliary cirrhosis: Secondary | ICD-10-CM | POA: Insufficient documentation

## 2017-11-07 DIAGNOSIS — B20 Human immunodeficiency virus [HIV] disease: Secondary | ICD-10-CM | POA: Insufficient documentation

## 2017-11-07 DIAGNOSIS — E785 Hyperlipidemia, unspecified: Secondary | ICD-10-CM | POA: Insufficient documentation

## 2017-11-07 DIAGNOSIS — R252 Cramp and spasm: Secondary | ICD-10-CM | POA: Insufficient documentation

## 2017-11-07 DIAGNOSIS — R634 Abnormal weight loss: Secondary | ICD-10-CM | POA: Insufficient documentation

## 2017-11-07 DIAGNOSIS — A630 Anogenital (venereal) warts: Secondary | ICD-10-CM | POA: Insufficient documentation

## 2017-11-07 DIAGNOSIS — K5190 Ulcerative colitis, unspecified, without complications: Secondary | ICD-10-CM | POA: Diagnosis not present

## 2017-11-07 DIAGNOSIS — K219 Gastro-esophageal reflux disease without esophagitis: Secondary | ICD-10-CM | POA: Insufficient documentation

## 2017-11-07 DIAGNOSIS — Z79899 Other long term (current) drug therapy: Secondary | ICD-10-CM | POA: Diagnosis not present

## 2017-11-07 DIAGNOSIS — R87622 Low grade squamous intraepithelial lesion on cytologic smear of vagina (LGSIL): Secondary | ICD-10-CM | POA: Diagnosis not present

## 2017-11-07 DIAGNOSIS — I10 Essential (primary) hypertension: Secondary | ICD-10-CM | POA: Diagnosis not present

## 2017-11-07 DIAGNOSIS — D509 Iron deficiency anemia, unspecified: Secondary | ICD-10-CM | POA: Diagnosis not present

## 2017-11-07 DIAGNOSIS — E559 Vitamin D deficiency, unspecified: Secondary | ICD-10-CM | POA: Insufficient documentation

## 2017-11-07 NOTE — Progress Notes (Signed)
Patient ID: Minahil Quinlivan, female    DOB: Dec 07, 1960  MRN: [id number]  CC: Hypertension   Subjective: Milady Fleener is a 57 y.o. female who presents for chronic ds management Her concerns today include:  Patient with history of HTN, HL, IDA, GERD,LGSILwith positive HPV (neg colpo 07/10/2017), former tobacco, HIV, ulcerative colitis and calf cramps  Ulcerative Colitis/PBC:  Missed Appt with her gastroenterologist in [facility] due to a funeral.  Has appt today.  Denies any diarrhea.  Reports normal bowel movements with no blood in the stools.  She is overdue for colonoscopy.  HIV: Next appointment with Dr. Drucilla Schmidt is December of this year.  Reports compliance with her medications She is noted to have wgh loss 7 lbs since 06/2017.  Had recent URI and was not eating well for several days.  Symptoms have since resolved and she reports that her appetite has returned.  She drinks Ensure to supplement meals Denies any chronic cough, fever, night sweats. No street drug use  HTN: Compliant with amlodipine and Lisinopril.  She limits salt in the foods.  HL: Compliant with atorvastatin.  Anemia:  Taking OTC iron supplement.   Patient Active Problem List   Diagnosis Date Noted  . LGSIL Pap smear of vagina 06/13/2017  . Immunization due 04/07/2017  . Abnormal Pap smear of anus 04/07/2017  . Calf cramp 12/17/2016  . HIV disease ([place]) 02/02/2015  . Current smoker 10/27/2014  . Hyperlipidemia 08/24/2014  . Vitamin D deficiency 08/24/2014  . S/P hysterectomy 08/23/2014  . Underweight 08/23/2014  . Ulcerative colitis ([place]) 05/27/2014  . HTN (hypertension) 05/27/2014  . Anemia of chronic disease 05/27/2014  . Insomnia 05/27/2014  . Dental caries 05/10/2014  . Protein-calorie malnutrition, severe ([place]) 04/26/2014  . Cavitary lesion of lung 04/25/2014     Current Outpatient Medications on File Prior to Visit  Medication Sig Dispense Refill  . amLODipine (NORVASC) 10 MG tablet Take 1 tablet  (10 mg total) by mouth daily. 30 tablet 0  . atorvastatin (LIPITOR) 10 MG tablet Take 1 tablet (10 mg total) by mouth daily. 30 tablet 0  . calcium citrate-vitamin D 500-400 MG-UNIT chewable tablet Chew 1 tablet by mouth 2 (two) times daily. (Patient not taking: Reported on 07/16/2017) 180 tablet 1  . feeding supplement, ENSURE COMPLETE, (ENSURE COMPLETE) LIQD Take 237 mLs by mouth 3 (three) times daily between meals. (Patient not taking: Reported on 07/10/2017) 237 mL 0  . FEROSUL 325 (65 Fe) MG tablet TAKE 1 TABLET BY MOUTH DAILY WITH BREAKFAST. (Patient not taking: Reported on 07/10/2017) 90 tablet 3  . GENVOYA 150-150-200-10 MG TABS tablet TAKE 1 TABLET BY MOUTH DAILY WITH BREAKFAST 30 tablet 5  . GENVOYA 150-150-200-10 MG TABS tablet TAKE 1 TABLET BY MOUTH DAILY WITH BREAKFAST 30 tablet 5  . hyoscyamine (LEVSIN, ANASPAZ) 0.125 MG tablet Take 1 tablet (0.125 mg total) by mouth every 6 (six) hours as needed. (Patient not taking: Reported on 07/16/2017) 120 tablet 0  . lisinopril (PRINIVIL,ZESTRIL) 5 MG tablet Take 1 tablet (5 mg total) by mouth daily. 90 tablet 3  . sulfaSALAzine (AZULFIDINE) 500 MG tablet Take 2 tablets (1,000 mg total) by mouth 2 (two) times daily. 120 tablet 6   No current facility-administered medications on file prior to visit.     No Known Allergies  Social History   Socioeconomic History  . Marital status: Single    Spouse name: Not on file  . Number of children: Not on file  .  Years of education: Not on file  . Highest education level: Not on file  Occupational History  . Not on file  Social Needs  . Financial resource strain: Not on file  . Food insecurity:    Worry: Not on file    Inability: Not on file  . Transportation needs:    Medical: Not on file    Non-medical: Not on file  Tobacco Use  . Smoking status: Former Smoker    Packs/day: 0.20    Years: 35.00    Pack years: 7.00    Types: Cigarettes  . Smokeless tobacco: Never Used  . Tobacco comment:  Smoking 2-3 cigs per day  Substance and Sexual Activity  . Alcohol use: No    Alcohol/week: 0.0 oz  . Drug use: No    Types: Cocaine    Comment: no drugs per patient since 2009  . Sexual activity: Yes    Birth control/protection: Other-see comments, Surgical    Comment: pt. given condoms  Lifestyle  . Physical activity:    Days per week: Not on file    Minutes per session: Not on file  . Stress: Not on file  Relationships  . Social connections:    Talks on phone: Not on file    Gets together: Not on file    Attends religious service: Not on file    Active member of club or organization: Not on file    Attends meetings of clubs or organizations: Not on file    Relationship status: Not on file  . Intimate partner violence:    Fear of current or ex partner: Not on file    Emotionally abused: Not on file    Physically abused: Not on file    Forced sexual activity: Not on file  Other Topics Concern  . Not on file  Social History Narrative  . Not on file    Family History  Problem Relation Age of Onset  . Cancer Father        ?  Marland Kitchen Alcoholism Father   . Diabetes Mother   . Stroke Mother   . Pancreatitis Sister   . Diabetes Sister   . Cancer Paternal Uncle 16       colon     Past Surgical History:  Procedure Laterality Date  . ABDOMINAL HYSTERECTOMY  2001    done in [facility], patient unsure of indication  . TEE WITHOUT CARDIOVERSION N/A 05/02/2014   Procedure: TRANSESOPHAGEAL ECHOCARDIOGRAM (TEE);  Surgeon: Larey Dresser, MD;  Location: [place];  Service: Cardiovascular;  Laterality: N/A;  . VIDEO BRONCHOSCOPY Bilateral 04/27/2014   Procedure: VIDEO BRONCHOSCOPY WITH FLUORO;  Surgeon: Rigoberto Noel, MD;  [facility];  Service: Cardiopulmonary;  Laterality: Bilateral;    ROS: Review of Systems Neg except as above  PHYSICAL EXAM: BP 122/72   Pulse 62   Temp 98.2 F (36.8 C) (Oral)   Resp 16   Wt 121 lb 9.6 oz (55.2 kg)   SpO2 100%    BMI 18.49 kg/m   Wt Readings from Last 3 Encounters:  11/07/17 121 lb 9.6 oz (55.2 kg)  07/16/17 124 lb 14.4 oz (56.7 kg)  07/10/17 128 lb 6.4 oz (58.2 kg)   Physical Exam  General appearance - alert, well appearing, and in no distress Mental status - normal mood, behavior, speech, dress, motor activity, and thought processes Mouth -no oral lesions or thrush.  She has numerous teeth broken off in the gum  due to decay. Neck -thyroid enlargement.  No thyroid nodules.  No cervical lymphadenopathy Lymphatics -no cervical or axillary lymphadenopathy Chest - clear to auscultation, no wheezes, rales or rhonchi, symmetric air entry Heart - normal rate, regular rhythm, normal S1, S2, no murmurs, rubs, clicks or gallops Extremities - peripheral pulses normal, no pedal edema, no clubbing or cyanosis  ASSESSMENT AND PLAN: 1. Unintentional weight loss In HIV pt - TSH - CBC - Comprehensive metabolic panel  2. Essential hypertension Goal.  Continue current medications  3. Hyperlipidemia, unspecified hyperlipidemia type Continue Lipitor.  4. Ulcerative colitis without complications, unspecified location ([place]) 5. Primary biliary cirrhosis [facility]) Patient given letter to take with her today when she sees the gastroenterologist.  She is overdue for colonoscopy  Patient was given the opportunity to ask questions.  Patient verbalized understanding of the plan and was able to repeat key elements of the plan.   Orders Placed This Encounter  Procedures  . TSH  . CBC  . Comprehensive metabolic panel     Requested Prescriptions    No prescriptions requested or ordered in this encounter    Return in about 3 months (around 02/07/2018).  Karle Plumber, MD, FACP

## 2017-11-08 LAB — TSH: TSH: 1.43 u[IU]/mL (ref 0.450–4.500)

## 2017-11-08 LAB — CBC
HEMOGLOBIN: 11.8 g/dL (ref 11.1–15.9)
Hematocrit: 34.7 % (ref 34.0–46.6)
MCH: 31.7 pg (ref 26.6–33.0)
MCHC: 34 g/dL (ref 31.5–35.7)
MCV: 93 fL (ref 79–97)
Platelets: 350 10*3/uL (ref 150–450)
RBC: 3.72 x10E6/uL — ABNORMAL LOW (ref 3.77–5.28)
RDW: 16.6 % — ABNORMAL HIGH (ref 12.3–15.4)
WBC: 5.2 10*3/uL (ref 3.4–10.8)

## 2017-11-08 LAB — COMPREHENSIVE METABOLIC PANEL
ALBUMIN: 4.6 g/dL (ref 3.5–5.5)
ALK PHOS: 722 IU/L — AB (ref 39–117)
ALT: 66 IU/L — ABNORMAL HIGH (ref 0–32)
AST: 71 IU/L — AB (ref 0–40)
Albumin/Globulin Ratio: 1.2 (ref 1.2–2.2)
BUN / CREAT RATIO: 14 (ref 9–23)
BUN: 11 mg/dL (ref 6–24)
Bilirubin Total: 0.6 mg/dL (ref 0.0–1.2)
CO2: 24 mmol/L (ref 20–29)
CREATININE: 0.77 mg/dL (ref 0.57–1.00)
Calcium: 10.1 mg/dL (ref 8.7–10.2)
Chloride: 106 mmol/L (ref 96–106)
GFR calc Af Amer: 98 mL/min/{1.73_m2} (ref >59)
GFR calc non Af Amer: 85 mL/min/{1.73_m2} (ref >59)
Globulin, Total: 3.9 g/dL (ref 1.5–4.5)
Glucose: 87 mg/dL (ref 65–99)
Potassium: 4.8 mmol/L (ref 3.5–5.2)
Sodium: 143 mmol/L (ref 134–144)
Total Protein: 8.5 g/dL (ref 6.0–8.5)

## 2017-11-12 ENCOUNTER — Telehealth: Payer: Self-pay

## 2017-11-12 NOTE — Telephone Encounter (Signed)
Contacted pt to go over lab results pt didn't answer left a detailed vm informing pt of results and if she has any questions or concerns to give me a call  If pt calls back please give results: thyroid level is normal. Anemia has improved. Kidney function is good. Liver enzymes still abnormal. Did she get to see the gastroenterologist in [facility] on Friday?

## 2017-11-13 DIAGNOSIS — K745 Biliary cirrhosis, unspecified: Secondary | ICD-10-CM | POA: Diagnosis not present

## 2017-11-13 DIAGNOSIS — R7989 Other specified abnormal findings of blood chemistry: Secondary | ICD-10-CM | POA: Diagnosis not present

## 2017-11-13 DIAGNOSIS — R1110 Vomiting, unspecified: Secondary | ICD-10-CM | POA: Diagnosis not present

## 2017-11-13 DIAGNOSIS — R109 Unspecified abdominal pain: Secondary | ICD-10-CM | POA: Diagnosis not present

## 2017-11-13 DIAGNOSIS — E869 Volume depletion, unspecified: Secondary | ICD-10-CM | POA: Diagnosis not present

## 2017-11-13 DIAGNOSIS — K5190 Ulcerative colitis, unspecified, without complications: Secondary | ICD-10-CM | POA: Diagnosis not present

## 2017-11-13 DIAGNOSIS — R05 Cough: Secondary | ICD-10-CM | POA: Diagnosis not present

## 2017-11-13 DIAGNOSIS — R935 Abnormal findings on diagnostic imaging of other abdominal regions, including retroperitoneum: Secondary | ICD-10-CM | POA: Diagnosis not present

## 2017-11-13 DIAGNOSIS — I10 Essential (primary) hypertension: Secondary | ICD-10-CM | POA: Diagnosis not present

## 2017-11-13 DIAGNOSIS — K743 Primary biliary cirrhosis: Secondary | ICD-10-CM | POA: Diagnosis not present

## 2017-11-13 DIAGNOSIS — R1084 Generalized abdominal pain: Secondary | ICD-10-CM | POA: Diagnosis not present

## 2017-11-13 DIAGNOSIS — R112 Nausea with vomiting, unspecified: Secondary | ICD-10-CM | POA: Diagnosis not present

## 2017-11-13 DIAGNOSIS — E43 Unspecified severe protein-calorie malnutrition: Secondary | ICD-10-CM | POA: Diagnosis not present

## 2017-11-13 DIAGNOSIS — E785 Hyperlipidemia, unspecified: Secondary | ICD-10-CM | POA: Diagnosis not present

## 2017-11-14 DIAGNOSIS — I10 Essential (primary) hypertension: Secondary | ICD-10-CM | POA: Diagnosis not present

## 2017-11-14 DIAGNOSIS — K743 Primary biliary cirrhosis: Secondary | ICD-10-CM | POA: Diagnosis not present

## 2017-11-14 DIAGNOSIS — E869 Volume depletion, unspecified: Secondary | ICD-10-CM | POA: Diagnosis not present

## 2017-12-04 ENCOUNTER — Telehealth: Payer: Self-pay

## 2017-12-04 NOTE — Telephone Encounter (Signed)
PT called today with questions/ concerns regarding Genvoya. Pt stated she saw on television how the medication she is currently taking is pt's issues with their bladders. PT stated she has been using the bathroom a lot " up to 30 times a day" and would like to know if it could be related to the medication. PT would like to know if it would be okay to schedule an appt to discuss this with Dr. Lucianne Lei dam; will route message to MD for advice. [place]

## 2017-12-05 NOTE — Telephone Encounter (Signed)
Yes, ok to schedule with Dr. Tommy Medal and no, it is not related to [facility].

## 2017-12-05 NOTE — Telephone Encounter (Signed)
Called pt to schedule an appt to be seen with Dr. Tommy Medal to discuss questions/ concerns about mediation, left a vm for pt to call back to make an appt. Marland Kitchen

## 2017-12-26 ENCOUNTER — Other Ambulatory Visit: Payer: Self-pay | Admitting: Internal Medicine

## 2017-12-26 DIAGNOSIS — B20 Human immunodeficiency virus [HIV] disease: Secondary | ICD-10-CM

## 2017-12-26 MED FILL — ATORVASTATIN 10 MG TABLET: 10 | 90 days supply | Qty: 90 | Fill #0

## 2018-01-21 ENCOUNTER — Other Ambulatory Visit: Payer: Self-pay

## 2018-01-21 DIAGNOSIS — I10 Essential (primary) hypertension: Secondary | ICD-10-CM

## 2018-01-21 MED ORDER — AMLODIPINE BESYLATE 10 MG PO TABS: 10.0000 mg | ORAL_TABLET | Freq: Every day | ORAL | 0 refills | Status: DC

## 2018-02-09 ENCOUNTER — Ambulatory Visit: Payer: Medicare Other | Admitting: Internal Medicine

## 2018-02-10 MED FILL — sulfaSALAzine 500 MG TABS: 500 | 30 days supply | Qty: 120 | Fill #4

## 2018-02-17 ENCOUNTER — Other Ambulatory Visit: Payer: Self-pay | Admitting: Infectious Disease

## 2018-02-17 DIAGNOSIS — B20 Human immunodeficiency virus [HIV] disease: Secondary | ICD-10-CM

## 2018-02-23 ENCOUNTER — Other Ambulatory Visit: Payer: Self-pay | Admitting: Internal Medicine

## 2018-02-23 DIAGNOSIS — I10 Essential (primary) hypertension: Secondary | ICD-10-CM

## 2018-03-30 ENCOUNTER — Ambulatory Visit: Payer: Medicare Other | Admitting: Internal Medicine

## 2018-04-17 ENCOUNTER — Other Ambulatory Visit: Payer: Self-pay

## 2018-04-17 NOTE — Patient Outreach (Signed)
[facility]) Care Management  04/17/2018  [name] [name] 28-Dec-1960 [id number]   Medication Adherence call to [name] [name] left a message for patient to call back patient is due on Atorvastatin 10 mg. [name] [name] is showing past due under [place].   [place] Management Direct Dial [phone]  Fax [phone] [email or web address]

## 2018-04-21 MED FILL — ATORVASTATIN 10 MG TABLET: 10 | 30 days supply | Qty: 30 | Fill #0

## 2018-04-23 ENCOUNTER — Encounter: Payer: Self-pay | Admitting: Internal Medicine

## 2018-04-23 ENCOUNTER — Ambulatory Visit: Payer: Medicare Other | Attending: Internal Medicine | Admitting: Internal Medicine

## 2018-04-23 VITALS — BP 155/95 | HR 79 | Temp 98.2°F | Resp 16 | Wt 127.4 lb

## 2018-04-23 DIAGNOSIS — Z5321 Procedure and treatment not carried out due to patient leaving prior to being seen by health care provider: Secondary | ICD-10-CM

## 2018-04-23 DIAGNOSIS — Z23 Encounter for immunization: Secondary | ICD-10-CM | POA: Diagnosis not present

## 2018-04-23 NOTE — Patient Instructions (Signed)
Influenza Virus Vaccine injection (Fluarix) What is this medicine? INFLUENZA VIRUS VACCINE (in floo EN zuh VAHY ruhs vak SEEN) helps to reduce the risk of getting influenza also known as the flu. This medicine may be used for other purposes; ask your health care provider or pharmacist if you have questions. COMMON BRAND NAME(S): Fluarix, Fluzone What should I tell my health care provider before I take this medicine? They need to know if you have any of these conditions: -bleeding disorder like hemophilia -fever or infection -Guillain-Barre syndrome or other neurological problems -immune system problems -infection with the human immunodeficiency virus (HIV) or AIDS -low blood platelet counts -multiple sclerosis -an unusual or allergic reaction to influenza virus vaccine, eggs, chicken proteins, latex, gentamicin, other medicines, foods, dyes or preservatives -pregnant or trying to get pregnant -breast-feeding How should I use this medicine? This vaccine is for injection into a muscle. It is given by a health care professional. A copy of Vaccine Information Statements will be given before each vaccination. Read this sheet carefully each time. The sheet may change frequently. Talk to your pediatrician regarding the use of this medicine in children. Special care may be needed. Overdosage: If you think you have taken too much of this medicine contact a poison control center or emergency room at once. NOTE: This medicine is only for you. Do not share this medicine with others. What if I miss a dose? This does not apply. What may interact with this medicine? -chemotherapy or radiation therapy -medicines that lower your immune system like etanercept, anakinra, infliximab, and adalimumab -medicines that treat or prevent blood clots like warfarin -phenytoin -steroid medicines like prednisone or cortisone -theophylline -vaccines This list may not describe all possible interactions. Give your  health care provider a list of all the medicines, herbs, non-prescription drugs, or dietary supplements you use. Also tell them if you smoke, drink alcohol, or use illegal drugs. Some items may interact with your medicine. What should I watch for while using this medicine? Report any side effects that do not go away within 3 days to your doctor or health care professional. Call your health care provider if any unusual symptoms occur within 6 weeks of receiving this vaccine. You may still catch the flu, but the illness is not usually as bad. You cannot get the flu from the vaccine. The vaccine will not protect against colds or other illnesses that may cause fever. The vaccine is needed every year. What side effects may I notice from receiving this medicine? Side effects that you should report to your doctor or health care professional as soon as possible: -allergic reactions like skin rash, itching or hives, swelling of the face, lips, or tongue Side effects that usually do not require medical attention (report to your doctor or health care professional if they continue or are bothersome): -fever -headache -muscle aches and pains -pain, tenderness, redness, or swelling at site where injected -weak or tired This list may not describe all possible side effects. Call your doctor for medical advice about side effects. You may report side effects to FDA at 1-800-FDA-1088. Where should I keep my medicine? This vaccine is only given in a clinic, pharmacy, doctor's office, or other health care setting and will not be stored at home. NOTE: This sheet is a summary. It may not cover all possible information. If you have questions about this medicine, talk to your doctor, pharmacist, or health care provider.  2018 Elsevier/Gold Standard (2007-12-30 09:30:40)

## 2018-04-25 NOTE — Progress Notes (Signed)
Pt left before being seen and rescheduled for later this mth.

## 2018-05-04 ENCOUNTER — Encounter: Payer: Self-pay | Admitting: Internal Medicine

## 2018-05-04 ENCOUNTER — Ambulatory Visit: Payer: Medicare Other | Attending: Internal Medicine | Admitting: Internal Medicine

## 2018-05-04 VITALS — BP 117/78 | HR 71 | Temp 98.1°F | Resp 16 | Wt 122.2 lb

## 2018-05-04 DIAGNOSIS — J069 Acute upper respiratory infection, unspecified: Secondary | ICD-10-CM

## 2018-05-04 DIAGNOSIS — K743 Primary biliary cirrhosis: Secondary | ICD-10-CM

## 2018-05-04 DIAGNOSIS — E785 Hyperlipidemia, unspecified: Secondary | ICD-10-CM

## 2018-05-04 DIAGNOSIS — K5190 Ulcerative colitis, unspecified, without complications: Secondary | ICD-10-CM | POA: Diagnosis not present

## 2018-05-04 DIAGNOSIS — E559 Vitamin D deficiency, unspecified: Secondary | ICD-10-CM | POA: Insufficient documentation

## 2018-05-04 DIAGNOSIS — Z79899 Other long term (current) drug therapy: Secondary | ICD-10-CM | POA: Insufficient documentation

## 2018-05-04 DIAGNOSIS — K769 Liver disease, unspecified: Secondary | ICD-10-CM | POA: Diagnosis not present

## 2018-05-04 DIAGNOSIS — B20 Human immunodeficiency virus [HIV] disease: Secondary | ICD-10-CM

## 2018-05-04 DIAGNOSIS — D638 Anemia in other chronic diseases classified elsewhere: Secondary | ICD-10-CM | POA: Diagnosis not present

## 2018-05-04 DIAGNOSIS — G4700 Insomnia, unspecified: Secondary | ICD-10-CM | POA: Diagnosis not present

## 2018-05-04 DIAGNOSIS — I10 Essential (primary) hypertension: Secondary | ICD-10-CM | POA: Diagnosis not present

## 2018-05-04 DIAGNOSIS — K219 Gastro-esophageal reflux disease without esophagitis: Secondary | ICD-10-CM | POA: Insufficient documentation

## 2018-05-04 DIAGNOSIS — R634 Abnormal weight loss: Secondary | ICD-10-CM

## 2018-05-04 DIAGNOSIS — F17210 Nicotine dependence, cigarettes, uncomplicated: Secondary | ICD-10-CM | POA: Insufficient documentation

## 2018-05-04 MED ORDER — ATORVASTATIN CALCIUM 10 MG PO TABS: 10.0000 mg | ORAL_TABLET | Freq: Every day | ORAL | 6 refills | Status: DC

## 2018-05-04 MED ORDER — SULFASALAZINE 500 MG PO TABS: 1000.0000 mg | ORAL_TABLET | Freq: Two times a day (BID) | ORAL | 6 refills | Status: DC

## 2018-05-04 MED FILL — sulfaSALAzine 500 MG TABS: 500 | 30 days supply | Qty: 120 | Fill #0

## 2018-05-04 NOTE — Progress Notes (Signed)
Patient ID: [name] [name], female    DOB: 01-10-1961  MRN: [id number]  CC: Follow-up and Hospitalization Follow-up   Subjective: [name] [name] is a 57 y.o. female who presents for chronic ds management Her concerns today include:  Patient with history of HTN, HL, IDA, GERD,LGSILwith positive HPV (neg colpo 07/10/2017),formertobacco, HIV, ulcerative colitis and calf cramps  Ulcerative colitis/PBC: When last seen, she reportedly had an appointment scheduled that day to see the gastroenterologist in [facility].  However patient states that she went there and nothing really was done.  She had to change providers through her insurance and reportedly has an appointment with GI at [facility] in [facility] at 11 a.m this morning.  She is requesting a copy of the letter that I gave the last time to take with her to the gastroenterologist explaining her situation -Hospitalized for 1 day at [facility] in May of this year.  She had CAT scan of the abdomen that revealed multiple small densities in the liver.  The radiologist recommended a repeat imaging in form of an ultrasound in 3 months Reports good appetite.  Some nausea at times Moving bowels okay.  No blood in stools  Geting over a cold which started 2 wks ago.  Most of the symptoms have resolved except for some occasional nausea.  HTN:  Compliant with Norvasc and Lisinopril.  Not limiting salts as much as she should.  Denies any chest pains or shortness of breath.  No lower extremity edema.  IDA:  Tolerating iron supplement  HIV:  Reports compliance with meds.  Has appointment with her infectious disease specialist before the end of the year.  HL:  Wants 90 day supply on atorvastatin.   Patient Active Problem List   Diagnosis Date Noted  . LGSIL Pap smear of vagina 06/13/2017  . Immunization due 04/07/2017  . Abnormal Pap smear of anus 04/07/2017  . Calf cramp 12/17/2016  . HIV disease ([place]) 02/02/2015  . Current smoker  10/27/2014  . Hyperlipidemia 08/24/2014  . Vitamin D deficiency 08/24/2014  . S/P hysterectomy 08/23/2014  . Underweight 08/23/2014  . Ulcerative colitis ([place]) 05/27/2014  . HTN (hypertension) 05/27/2014  . Anemia of chronic disease 05/27/2014  . Insomnia 05/27/2014  . Dental caries 05/10/2014  . Protein-calorie malnutrition, severe ([place]) 04/26/2014  . Cavitary lesion of lung 04/25/2014     Current Outpatient Medications on File Prior to Visit  Medication Sig Dispense Refill  . amLODipine (NORVASC) 10 MG tablet Take 1 tablet (10 mg total) by mouth daily. 90 tablet 0  . calcium citrate-vitamin D 500-400 MG-UNIT chewable tablet Chew 1 tablet by mouth 2 (two) times daily. (Patient not taking: Reported on 07/16/2017) 180 tablet 1  . feeding supplement, ENSURE COMPLETE, (ENSURE COMPLETE) LIQD Take 237 mLs by mouth 3 (three) times daily between meals. (Patient not taking: Reported on 07/10/2017) 237 mL 0  . FEROSUL 325 (65 Fe) MG tablet TAKE 1 TABLET BY MOUTH DAILY WITH BREAKFAST. (Patient not taking: Reported on 07/10/2017) 90 tablet 3  . GENVOYA 150-150-200-10 MG TABS tablet TAKE 1 TABLET BY MOUTH DAILY WITH BREAKFAST 30 tablet 5  . GENVOYA 150-150-200-10 MG TABS tablet TAKE 1 TABLET BY MOUTH DAILY WITH BREAKFAST 30 tablet 5  . hyoscyamine (LEVSIN, ANASPAZ) 0.125 MG tablet Take 1 tablet (0.125 mg total) by mouth every 6 (six) hours as needed. (Patient not taking: Reported on 07/16/2017) 120 tablet 0  . lisinopril (PRINIVIL,ZESTRIL) 5 MG tablet Take 1 tablet (5  mg total) by mouth daily. 90 tablet 3   No current facility-administered medications on file prior to visit.     No Known Allergies  Social History   Socioeconomic History  . Marital status: Single    Spouse name: Not on file  . Number of children: Not on file  . Years of education: Not on file  . Highest education level: Not on file  Occupational History  . Not on file  Social Needs  . Financial resource strain: Not on file    . Food insecurity:    Worry: Not on file    Inability: Not on file  . Transportation needs:    Medical: Not on file    Non-medical: Not on file  Tobacco Use  . Smoking status: Former Smoker    Packs/day: 0.20    Years: 35.00    Pack years: 7.00    Types: Cigarettes  . Smokeless tobacco: Never Used  . Tobacco comment: Smoking 2-3 cigs per day  Substance and Sexual Activity  . Alcohol use: No    Alcohol/week: 0.0 standard drinks  . Drug use: No    Types: Cocaine    Comment: no drugs per patient since 2009  . Sexual activity: Yes    Birth control/protection: Other-see comments, Surgical    Comment: pt. given condoms  Lifestyle  . Physical activity:    Days per week: Not on file    Minutes per session: Not on file  . Stress: Not on file  Relationships  . Social connections:    Talks on phone: Not on file    Gets together: Not on file    Attends religious service: Not on file    Active member of club or organization: Not on file    Attends meetings of clubs or organizations: Not on file    Relationship status: Not on file  . Intimate partner violence:    Fear of current or ex partner: Not on file    Emotionally abused: Not on file    Physically abused: Not on file    Forced sexual activity: Not on file  Other Topics Concern  . Not on file  Social History Narrative  . Not on file    Family History  Problem Relation Age of Onset  . Cancer Father        ?  Marland Kitchen Alcoholism Father   . Diabetes Mother   . Stroke Mother   . Pancreatitis Sister   . Diabetes Sister   . Cancer Paternal Uncle 62       colon     Past Surgical History:  Procedure Laterality Date  . ABDOMINAL HYSTERECTOMY  2001    done in [facility], patient unsure of indication  . TEE WITHOUT CARDIOVERSION N/A 05/02/2014   Procedure: TRANSESOPHAGEAL ECHOCARDIOGRAM (TEE);  Surgeon: Larey Dresser, MD;  Location: [facility];  Service: Cardiovascular;  Laterality: N/A;  . VIDEO BRONCHOSCOPY  Bilateral 04/27/2014   Procedure: VIDEO BRONCHOSCOPY WITH FLUORO;  Surgeon: Rigoberto Noel, MD;  [facility];  Service: Cardiopulmonary;  Laterality: Bilateral;    ROS: Review of Systems Negative except as above  PHYSICAL EXAM: BP 117/78   Pulse 71   Temp 98.1 F (36.7 C) (Oral)   Resp 16   Wt 122 lb 3.2 oz (55.4 kg)   SpO2 98%   BMI 18.58 kg/m   Wt Readings from Last 3 Encounters:  05/04/18 122 lb 3.2 oz (55.4 kg)  04/23/18  127 lb 6.4 oz (57.8 kg)  11/07/17 121 lb 9.6 oz (55.2 kg)   Physical Exam  General appearance - alert, well appearing, and in no distress Mental status - normal mood, behavior, speech, dress, motor activity, and thought processes Eyes - pupils equal and reactive, extraocular eye movements intact Mouth - mucous membranes moist, pharynx normal without lesions Neck - supple, no significant adenopathy Chest - clear to auscultation, no wheezes, rales or rhonchi, symmetric air entry Heart - normal rate, regular rhythm, normal S1, S2, no murmurs, rubs, clicks or gallops Abdomen - soft, nontender, nondistended, no masses or organomegaly Extremities - peripheral pulses normal, no pedal edema, no clubbing or cyanosis  ASSESSMENT AND PLAN: 1. Essential hypertension At goal.  Continue current medications  2. Ulcerative colitis without complications, unspecified location ([place]) 3. Primary biliary cirrhosis (HCC) Letter given to take with her today when she sees the gastroenterologist.  If she does not get to see a gastroenterologist later, I have requested that she call me back and let me know so that we can refer her locally here in [place]. - sulfaSALAzine (AZULFIDINE) 500 MG tablet; Take 2 tablets (1,000 mg total) by mouth 2 (two) times daily.  Dispense: 120 tablet; Refill: 6  4. Liver lesion Will need to follow-up on this - US Abdomen Limited RUQ; Future  5. Unintentional weight loss Lost 10 pounds in the past year.  TSH was normal.  She is  overdue for colonoscopy which I am trying to get her in for with the gastroenterologist.  We also need to follow-up on the liver densities with an ultrasound.  She is to keep her appointment with the infectious disease specialist later this month.  6. HIV disease ([place]) Has upcoming appointment with ID  7. Viral upper respiratory tract infection Looks like she is on the tail end of this.  8. Hyperlipidemia, unspecified hyperlipidemia type - atorvastatin (LIPITOR) 10 MG tablet; Take 1 tablet (10 mg total) by mouth daily.  Dispense: 90 tablet; Refill: 6     Patient was given the opportunity to ask questions.  Patient verbalized understanding of the plan and was able to repeat key elements of the plan.   Orders Placed This Encounter  Procedures  . US Abdomen Limited RUQ     Requested Prescriptions   Signed Prescriptions Disp Refills  . atorvastatin (LIPITOR) 10 MG tablet 90 tablet 6    Sig: Take 1 tablet (10 mg total) by mouth daily.  Marland Kitchen sulfaSALAzine (AZULFIDINE) 500 MG tablet 120 tablet 6    Sig: Take 2 tablets (1,000 mg total) by mouth 2 (two) times daily.    Return in about 3 months (around 08/04/2018) for PAP.  Karle Plumber, MD, FACP

## 2018-05-04 NOTE — Progress Notes (Signed)
Pt states she thinks she has build up gas because she I shaving some discomfort in her stomach   Pt is needing lisinopril and amlodipine refilled and sent to mail order   Pt is needing liptor and azulfidine refilled and sent to [facility]

## 2018-05-06 ENCOUNTER — Other Ambulatory Visit: Payer: Medicare Other

## 2018-05-06 ENCOUNTER — Other Ambulatory Visit (HOSPITAL_COMMUNITY)
Admission: RE | Admit: 2018-05-06 | Discharge: 2018-05-06 | Disposition: A | Discharge status: Home / Self Care | Payer: Medicare Other | Source: Ambulatory Visit | Attending: Infectious Disease | Admitting: Infectious Disease

## 2018-05-06 DIAGNOSIS — E7849 Other hyperlipidemia: Secondary | ICD-10-CM | POA: Diagnosis not present

## 2018-05-06 DIAGNOSIS — E785 Hyperlipidemia, unspecified: Secondary | ICD-10-CM | POA: Diagnosis not present

## 2018-05-06 DIAGNOSIS — B20 Human immunodeficiency virus [HIV] disease: Secondary | ICD-10-CM

## 2018-05-06 DIAGNOSIS — I10 Essential (primary) hypertension: Secondary | ICD-10-CM | POA: Insufficient documentation

## 2018-05-06 DIAGNOSIS — K5180 Other ulcerative colitis without complications: Secondary | ICD-10-CM

## 2018-05-06 DIAGNOSIS — Z79899 Other long term (current) drug therapy: Secondary | ICD-10-CM | POA: Diagnosis not present

## 2018-05-06 DIAGNOSIS — Z113 Encounter for screening for infections with a predominantly sexual mode of transmission: Secondary | ICD-10-CM

## 2018-05-07 LAB — MICROALBUMIN / CREATININE URINE RATIO
CREATININE, URINE: 154 mg/dL (ref 20–275)
Microalb Creat Ratio: 11 mcg/mg creat (ref <30)
Microalb, Ur: 1.7 mg/dL

## 2018-05-07 LAB — URINE CYTOLOGY ANCILLARY ONLY
Chlamydia: NEGATIVE
NEISSERIA GONORRHEA: NEGATIVE

## 2018-05-07 LAB — T-HELPER CELL (CD4) - (RCID CLINIC ONLY)
CD4 % Helper T Cell: 21 % — ABNORMAL LOW (ref 33–55)
CD4 T Cell Abs: 410 /uL (ref 400–2700)

## 2018-05-08 ENCOUNTER — Ambulatory Visit (HOSPITAL_COMMUNITY)
Admission: RE | Admit: 2018-05-08 | Discharge: 2018-05-08 | Disposition: A | Discharge status: Home / Self Care | Payer: Medicare Other | Source: Ambulatory Visit | Attending: Internal Medicine | Admitting: Internal Medicine

## 2018-05-08 DIAGNOSIS — K769 Liver disease, unspecified: Secondary | ICD-10-CM | POA: Insufficient documentation

## 2018-05-08 DIAGNOSIS — K7689 Other specified diseases of liver: Secondary | ICD-10-CM | POA: Diagnosis not present

## 2018-05-08 LAB — COMPLETE METABOLIC PANEL WITH GFR
AG RATIO: 1.2 (calc) (ref 1.0–2.5)
ALBUMIN MSPROF: 4.2 g/dL (ref 3.6–5.1)
ALT: 48 U/L — ABNORMAL HIGH (ref 6–29)
AST: 62 U/L — ABNORMAL HIGH (ref 10–35)
Alkaline phosphatase (APISO): 608 U/L — ABNORMAL HIGH (ref 33–130)
BILIRUBIN TOTAL: 0.6 mg/dL (ref 0.2–1.2)
BUN: 13 mg/dL (ref 7–25)
CHLORIDE: 105 mmol/L (ref 98–110)
CO2: 28 mmol/L (ref 20–32)
Calcium: 9.7 mg/dL (ref 8.6–10.4)
Creat: 0.83 mg/dL (ref 0.50–1.05)
GFR, EST AFRICAN AMERICAN: 89 mL/min/{1.73_m2} (ref >=60)
GFR, Est Non African American: 77 mL/min/{1.73_m2} (ref >=60)
GLOBULIN: 3.5 g/dL (ref 1.9–3.7)
Glucose, Bld: 112 mg/dL — ABNORMAL HIGH (ref 65–99)
POTASSIUM: 4 mmol/L (ref 3.5–5.3)
SODIUM: 141 mmol/L (ref 135–146)
Total Protein: 7.7 g/dL (ref 6.1–8.1)

## 2018-05-08 LAB — CBC WITH DIFFERENTIAL/PLATELET
BASOS ABS: 21 {cells}/uL (ref 0–200)
Basophils Relative: 0.5 %
EOS ABS: 223 {cells}/uL (ref 15–500)
Eosinophils Relative: 5.3 %
HCT: 31.3 % — ABNORMAL LOW (ref 35.0–45.0)
HEMOGLOBIN: 10.7 g/dL — AB (ref 11.7–15.5)
Lymphs Abs: 1932 cells/uL (ref 850–3900)
MCH: 31.4 pg (ref 27.0–33.0)
MCHC: 34.2 g/dL (ref 32.0–36.0)
MCV: 91.8 fL (ref 80.0–100.0)
MONOS PCT: 9.6 %
MPV: 10.1 fL (ref 7.5–12.5)
NEUTROS PCT: 38.6 %
Neutro Abs: 1621 cells/uL (ref 1500–7800)
Platelets: 334 10*3/uL (ref 140–400)
RBC: 3.41 10*6/uL — ABNORMAL LOW (ref 3.80–5.10)
RDW: 14.5 % (ref 11.0–15.0)
Total Lymphocyte: 46 %
WBC mixed population: 403 cells/uL (ref 200–950)
WBC: 4.2 10*3/uL (ref 3.8–10.8)

## 2018-05-08 LAB — HIV-1 RNA QUANT-NO REFLEX-BLD
HIV 1 RNA QUANT: NOT DETECTED {copies}/mL
HIV-1 RNA QUANT, LOG: NOT DETECTED {Log_copies}/mL

## 2018-05-08 LAB — LIPID PANEL
CHOLESTEROL: 208 mg/dL — AB (ref <200)
HDL: 72 mg/dL (ref >50)
LDL Cholesterol (Calc): 118 mg/dL (calc) — ABNORMAL HIGH
NON-HDL CHOLESTEROL (CALC): 136 mg/dL — AB (ref <130)
TRIGLYCERIDES: 81 mg/dL (ref <150)
Total CHOL/HDL Ratio: 2.9 (calc) (ref <5.0)

## 2018-05-08 LAB — RPR: RPR Ser Ql: NONREACTIVE

## 2018-05-11 ENCOUNTER — Telehealth: Payer: Self-pay | Admitting: Internal Medicine

## 2018-05-11 NOTE — Telephone Encounter (Signed)
PC placed to pt today to discuss results of U/S of liver.  I left message on VM of mobile phone informing her that I was calling to discuss results of liver US.  Will try her again tomorrow.  Unable to leave message on home phone - message states this person is not available at this time.

## 2018-05-12 ENCOUNTER — Telehealth: Payer: Self-pay | Admitting: Internal Medicine

## 2018-05-12 DIAGNOSIS — K769 Liver disease, unspecified: Secondary | ICD-10-CM

## 2018-05-12 NOTE — Telephone Encounter (Signed)
Phone call placed to patient again this morning.  I left a message on her cell phone informing her that I was calling to go over the results of her liver ultrasound.  I requested that she give a call to the office.  In the meantime I will go ahead and send her a letter.

## 2018-05-12 NOTE — Telephone Encounter (Signed)
MRI is scheduled for May 25 2018 @11am  @[facility]. Pt is to arrive @1030am  and NPO 4 hours prior

## 2018-05-12 NOTE — Telephone Encounter (Signed)
Contacted pt to go over MRI information pt didn't answer left a detailed vm informing pt of information and will mail a letter

## 2018-05-19 ENCOUNTER — Other Ambulatory Visit: Payer: Self-pay | Admitting: Internal Medicine

## 2018-05-19 ENCOUNTER — Other Ambulatory Visit: Payer: Self-pay

## 2018-05-19 DIAGNOSIS — I10 Essential (primary) hypertension: Secondary | ICD-10-CM

## 2018-05-19 NOTE — Patient Outreach (Signed)
[facility]) Care Management  05/19/2018  Monya Kozakiewicz 05-23-1961 [id number]   Medication Adherence call to Mrs. Layna Roeper left a message for patient to call back patient is due on Lisinopril 5 mg. Mrs. Rosamilia  Is showing past due under [employer].   [place] Management Direct Dial [phone]  Fax [phone] [email or web address]

## 2018-05-20 ENCOUNTER — Other Ambulatory Visit: Payer: Self-pay

## 2018-05-20 ENCOUNTER — Ambulatory Visit: Payer: Medicare Other | Admitting: Infectious Disease

## 2018-05-25 ENCOUNTER — Ambulatory Visit: Payer: Medicare Other | Admitting: Infectious Disease

## 2018-05-25 ENCOUNTER — Ambulatory Visit (HOSPITAL_COMMUNITY): Admission: RE | Admit: 2018-05-25 | Payer: Medicare Other | Source: Ambulatory Visit

## 2018-05-25 ENCOUNTER — Telehealth: Payer: Self-pay | Admitting: Internal Medicine

## 2018-05-25 NOTE — Telephone Encounter (Signed)
Tried to contact patient but no answer. Will try again later.

## 2018-05-25 NOTE — Telephone Encounter (Signed)
Patient called to notify that they need another appointment for an MRI because she was not feel well and is expieriencing coughing and body ache.Please follow up.

## 2018-05-25 NOTE — Telephone Encounter (Signed)
Please provide pt scheduling number to reschedule MRI  [phone]

## 2018-05-27 ENCOUNTER — Telehealth: Payer: Self-pay | Admitting: Internal Medicine

## 2018-05-27 NOTE — Telephone Encounter (Signed)
PC placed to pt today.  She did receive my letter informing her of abnormal findings on liver US and need for MRI.  MRI liver was scheduled for 05/25/2018 but she requested to have date changed as he has a bad cold.  She is aware of new appt for 05/30/2018.   I inquired whether she saw GI in [facility] after last visit with me.  She reports that she did and did give them the letter that I sent. She said they told her they will get in contact with me.  I do not see this info in Care Everywhere.

## 2018-05-30 ENCOUNTER — Ambulatory Visit (HOSPITAL_COMMUNITY)
Admission: RE | Admit: 2018-05-30 | Discharge: 2018-05-30 | Disposition: A | Discharge status: Home / Self Care | Payer: Medicare Other | Source: Ambulatory Visit | Attending: Internal Medicine | Admitting: Internal Medicine

## 2018-05-30 DIAGNOSIS — I700 Atherosclerosis of aorta: Secondary | ICD-10-CM | POA: Insufficient documentation

## 2018-05-30 DIAGNOSIS — K769 Liver disease, unspecified: Secondary | ICD-10-CM | POA: Diagnosis not present

## 2018-05-30 MED ORDER — GADOBUTROL 1 MMOL/ML IV SOLN
5.0000 mL | Freq: Once | INTRAVENOUS | Status: AC | PRN
  Administered 2018-05-30: 5 mL via INTRAVENOUS

## 2018-06-02 ENCOUNTER — Telehealth: Payer: Self-pay | Admitting: Internal Medicine

## 2018-06-02 DIAGNOSIS — K5190 Ulcerative colitis, unspecified, without complications: Secondary | ICD-10-CM

## 2018-06-02 DIAGNOSIS — K743 Primary biliary cirrhosis: Secondary | ICD-10-CM

## 2018-06-02 DIAGNOSIS — K769 Liver disease, unspecified: Secondary | ICD-10-CM

## 2018-06-02 NOTE — Telephone Encounter (Signed)
Phone call placed to patient today to go over the results of the MRI of the liver.  This revealed that the spots seen in the liver are likely cysts rather than HCC.  She also has changes in the liver to suggest cirrhosis.  Even though patient reports having seen a gastroenterologist in [facility], I have not received any notes and I do not see anything on care everywhere.  I recommend referring her to a gastroenterologist here locally in [facility] for further evaluation.  Patient is agreeable to this.

## 2018-06-17 HISTORY — PX: COLONOSCOPY: SHX174

## 2018-06-18 MED FILL — sulfaSALAzine 500 MG TABS: 500 | 90 days supply | Qty: 360 | Fill #0

## 2018-06-18 MED FILL — ATORVASTATIN 10 MG TABLET: 10 | 90 days supply | Qty: 90 | Fill #0

## 2018-07-03 ENCOUNTER — Other Ambulatory Visit: Payer: Self-pay | Admitting: Pharmacist

## 2018-07-03 ENCOUNTER — Encounter: Payer: Self-pay | Admitting: Infectious Disease

## 2018-07-03 ENCOUNTER — Ambulatory Visit (INDEPENDENT_AMBULATORY_CARE_PROVIDER_SITE_OTHER): Payer: Medicare Other | Admitting: Infectious Disease

## 2018-07-03 VITALS — BP 126/75 | HR 77 | Temp 98.2°F | Wt 122.0 lb

## 2018-07-03 DIAGNOSIS — K743 Primary biliary cirrhosis: Secondary | ICD-10-CM | POA: Diagnosis not present

## 2018-07-03 DIAGNOSIS — Z23 Encounter for immunization: Secondary | ICD-10-CM

## 2018-07-03 DIAGNOSIS — K5180 Other ulcerative colitis without complications: Secondary | ICD-10-CM

## 2018-07-03 DIAGNOSIS — B20 Human immunodeficiency virus [HIV] disease: Secondary | ICD-10-CM

## 2018-07-03 HISTORY — DX: Primary biliary cirrhosis: K74.3

## 2018-07-03 MED ORDER — BICTEGRAVIR-EMTRICITAB-TENOFOV 50-200-25 MG PO TABS: 1.0000 | ORAL_TABLET | Freq: Every day | ORAL | 11 refills | Status: DC

## 2018-07-03 MED FILL — BIKTARVY 50-200-25 MG TABS: 50-200-25 | 30 days supply | Qty: 30 | Fill #0

## 2018-07-03 NOTE — Progress Notes (Signed)
Chief complaint: Follow-up for HIV on medications  Subjective:    Patient ID: [name] [name], female    DOB: [birth year], 58 y.o.   MRN: [id number]  HPI  This is a 58 year-old African-American lady with HIV, ulcerative colitis newly diagnosed primary biliary cirrhosis, hypertension on GENVOYA for HIV and doing perfectly with re to virological suppression. She is happy she now has Medicare and Medicaid  She comes in the clinic for routine follow-up for HIV  I would like to switch her from [place] to [place] to minimize drug drug interactions now that she has multiple other medical problems that may require different medications.    Past Medical History:  Diagnosis Date  . Arthritis   . Cavities 02/02/2015  . GERD (gastroesophageal reflux disease) Dx 2014  . GI bleed   . HIV (human immunodeficiency virus infection) ([place]) Dx 2015  . Hypertension Dx 2014  . Substance abuse ([place])    Quit 2008  . Ulcerative colitis [facility])     Past Surgical History:  Procedure Laterality Date  . ABDOMINAL HYSTERECTOMY  2001    done in [facility], patient unsure of indication  . TEE WITHOUT CARDIOVERSION N/A 05/02/2014   Procedure: TRANSESOPHAGEAL ECHOCARDIOGRAM (TEE);  Surgeon: Larey Dresser, MD;  Location: [place] [place] [facility];  Service: Cardiovascular;  Laterality: N/A;  . VIDEO BRONCHOSCOPY Bilateral 04/27/2014   Procedure: VIDEO BRONCHOSCOPY WITH FLUORO;  Surgeon: Rigoberto Noel, MD;  [facility];  Service: Cardiopulmonary;  Laterality: Bilateral;    Family History  Problem Relation Age of Onset  . Cancer Father        ?  Marland Kitchen Alcoholism Father   . Diabetes Mother   . Stroke Mother   . Pancreatitis Sister   . Diabetes Sister   . Cancer Paternal Uncle 73       colon       Social History   Socioeconomic History  . Marital status: Single    Spouse name: Not on file  . Number of children: Not on file  . Years of education: Not on file  . Highest education level: Not on file    Occupational History  . Not on file  Social Needs  . Financial resource strain: Not on file  . Food insecurity:    Worry: Not on file    Inability: Not on file  . Transportation needs:    Medical: Not on file    Non-medical: Not on file  Tobacco Use  . Smoking status: Former Smoker    Packs/day: 0.20    Years: 35.00    Pack years: 7.00    Types: Cigarettes  . Smokeless tobacco: Never Used  . Tobacco comment: Smoking 2-3 cigs per day  Substance and Sexual Activity  . Alcohol use: No    Alcohol/week: 0.0 standard drinks  . Drug use: No    Types: Cocaine    Comment: no drugs per patient since 2009  . Sexual activity: Yes    Birth control/protection: Other-see comments, Surgical    Comment: pt. given condoms  Lifestyle  . Physical activity:    Days per week: Not on file    Minutes per session: Not on file  . Stress: Not on file  Relationships  . Social connections:    Talks on phone: Not on file    Gets together: Not on file    Attends religious service: Not on file    Active member of club or organization: Not on file  Attends meetings of clubs or organizations: Not on file    Relationship status: Not on file  Other Topics Concern  . Not on file  Social History Narrative  . Not on file    No Known Allergies   Current Outpatient Medications:  .  amLODipine (NORVASC) 10 MG tablet, TAKE 1 TABLET(10 MG) BY MOUTH DAILY, Disp: 90 tablet, Rfl: 0 .  atorvastatin (LIPITOR) 10 MG tablet, Take 1 tablet (10 mg total) by mouth daily., Disp: 90 tablet, Rfl: 6 .  calcium citrate-vitamin D 500-400 MG-UNIT chewable tablet, Chew 1 tablet by mouth 2 (two) times daily., Disp: 180 tablet, Rfl: 1 .  feeding supplement, ENSURE COMPLETE, (ENSURE COMPLETE) LIQD, Take 237 mLs by mouth 3 (three) times daily between meals., Disp: 237 mL, Rfl: 0 .  FEROSUL 325 (65 Fe) MG tablet, TAKE 1 TABLET BY MOUTH DAILY WITH BREAKFAST., Disp: 90 tablet, Rfl: 3 .  GENVOYA 150-150-200-10 MG TABS tablet,  TAKE 1 TABLET BY MOUTH DAILY WITH BREAKFAST, Disp: 30 tablet, Rfl: 5 .  hyoscyamine (LEVSIN, ANASPAZ) 0.125 MG tablet, Take 1 tablet (0.125 mg total) by mouth every 6 (six) hours as needed., Disp: 120 tablet, Rfl: 0 .  lisinopril (PRINIVIL,ZESTRIL) 5 MG tablet, Take 1 tablet (5 mg total) by mouth daily., Disp: 90 tablet, Rfl: 3 .  sulfaSALAzine (AZULFIDINE) 500 MG tablet, Take 2 tablets (1,000 mg total) by mouth 2 (two) times daily., Disp: 120 tablet, Rfl: 6   Review of Systems  Constitutional: Negative for activity change, appetite change, chills, diaphoresis, fatigue, fever and unexpected weight change.  HENT: Negative for congestion, rhinorrhea, sinus pressure, sneezing, sore throat and trouble swallowing.   Eyes: Negative for photophobia and visual disturbance.  Respiratory: Negative for cough, shortness of breath, wheezing and stridor.   Cardiovascular: Negative for chest pain, palpitations and leg swelling.  Gastrointestinal: Negative for abdominal distention, abdominal pain, anal bleeding, blood in stool, constipation, diarrhea, nausea and vomiting.  Genitourinary: Negative for difficulty urinating, dysuria, flank pain and hematuria.  Musculoskeletal: Negative for arthralgias, back pain, gait problem, joint swelling and myalgias.  Skin: Negative for color change, pallor, rash and wound.  Neurological: Negative for dizziness, tremors, weakness and light-headedness.  Hematological: Negative for adenopathy. Does not bruise/bleed easily.  Psychiatric/Behavioral: Negative for agitation, behavioral problems, confusion, decreased concentration, dysphoric mood and sleep disturbance. The patient is not hyperactive.        Objective:   Physical Exam  Constitutional: She is oriented to person, place, and time. She appears well-developed and well-nourished. No distress.  HENT:  Head: Normocephalic and atraumatic.  Mouth/Throat: No oropharyngeal exudate.  Eyes: EOM are normal. No scleral  icterus.  Neck: Normal range of motion. Neck supple.  Cardiovascular: Normal rate and regular rhythm.  Pulmonary/Chest: Effort normal. No respiratory distress. She has no wheezes.  Abdominal: Soft. She exhibits no distension.  Musculoskeletal:        General: No tenderness or edema.  Neurological: She is alert and oriented to person, place, and time. Cranial nerve deficit: .25. She exhibits normal muscle tone. Coordination normal.  Skin: Skin is warm and dry. No rash noted. She is not diaphoretic. No erythema. No pallor.  Psychiatric: She has a normal mood and affect. Her behavior is normal. Judgment and thought content normal.  Nursing note and vitals reviewed.         Assessment & Plan:   HIV: Switch to BIKTARVY  To minimize drug drug interactions and recheck viral load in 2 months time  Hypertension :  Well controlled Vitals:   07/03/18 0924  BP: 126/75  Pulse: 77  Temp: 98.2 F (36.8 C)   UC: Sulfasalazine and well controlled  PBC: Followed by GI   I spent greater than 25 minutes with the patient including greater than 50% of time in face to face counsel of the patient guarding her new regimen Biktarvy and how to take this and space it out from her insurer and her multivitamins and her iron as these other agents can chelate the bictegravir and compromises absorption, how this regimen would would fit better with other drugs that she may need and in coordination of her care

## 2018-07-22 ENCOUNTER — Other Ambulatory Visit: Payer: Self-pay

## 2018-07-22 DIAGNOSIS — I10 Essential (primary) hypertension: Secondary | ICD-10-CM

## 2018-07-22 MED ORDER — LISINOPRIL 5 MG PO TABS: 5.0000 mg | ORAL_TABLET | Freq: Every day | ORAL | 0 refills | Status: DC

## 2018-07-22 MED ORDER — AMLODIPINE BESYLATE 10 MG PO TABS: ORAL_TABLET | ORAL | 0 refills | Status: DC

## 2018-07-22 MED FILL — AMLODIPINE BESYLATE 10 MG T: 10 | 90 days supply | Qty: 90 | Fill #0

## 2018-07-22 MED FILL — LISINOPRIL 5 MG TABLET: 5 | 90 days supply | Qty: 90 | Fill #0

## 2018-07-30 MED FILL — BIKTARVY 50-200-25 MG TABS: 50-200-25 | 30 days supply | Qty: 30 | Fill #1

## 2018-08-10 ENCOUNTER — Ambulatory Visit: Payer: Medicare Other | Admitting: Internal Medicine

## 2018-08-11 ENCOUNTER — Ambulatory Visit: Payer: Medicare Other | Admitting: Internal Medicine

## 2018-08-14 ENCOUNTER — Other Ambulatory Visit: Payer: Medicare Other

## 2018-08-19 ENCOUNTER — Other Ambulatory Visit: Payer: Medicare Other

## 2018-08-27 MED FILL — BIKTARVY 50-200-25 MG TABS: 50-200-25 | 30 days supply | Qty: 30 | Fill #2

## 2018-09-01 ENCOUNTER — Encounter: Payer: Medicare Other | Admitting: Infectious Disease

## 2018-09-08 ENCOUNTER — Other Ambulatory Visit (HOSPITAL_COMMUNITY)
Admission: RE | Admit: 2018-09-08 | Discharge: 2018-09-08 | Disposition: A | Discharge status: Home / Self Care | Payer: Medicare Other | Source: Ambulatory Visit | Attending: Infectious Disease | Admitting: Infectious Disease

## 2018-09-08 ENCOUNTER — Other Ambulatory Visit: Payer: Self-pay

## 2018-09-08 ENCOUNTER — Encounter: Payer: Self-pay | Admitting: Infectious Disease

## 2018-09-08 ENCOUNTER — Ambulatory Visit (INDEPENDENT_AMBULATORY_CARE_PROVIDER_SITE_OTHER): Payer: Medicare Other | Admitting: Infectious Disease

## 2018-09-08 VITALS — BP 114/69 | HR 75 | Temp 98.0°F | Ht 68.0 in | Wt 126.0 lb

## 2018-09-08 DIAGNOSIS — B20 Human immunodeficiency virus [HIV] disease: Secondary | ICD-10-CM | POA: Diagnosis not present

## 2018-09-08 DIAGNOSIS — I10 Essential (primary) hypertension: Secondary | ICD-10-CM

## 2018-09-08 DIAGNOSIS — Z113 Encounter for screening for infections with a predominantly sexual mode of transmission: Secondary | ICD-10-CM | POA: Diagnosis not present

## 2018-09-08 DIAGNOSIS — K5100 Ulcerative (chronic) pancolitis without complications: Secondary | ICD-10-CM

## 2018-09-08 DIAGNOSIS — E7849 Other hyperlipidemia: Secondary | ICD-10-CM | POA: Diagnosis not present

## 2018-09-08 DIAGNOSIS — Z79899 Other long term (current) drug therapy: Secondary | ICD-10-CM | POA: Insufficient documentation

## 2018-09-08 DIAGNOSIS — K743 Primary biliary cirrhosis: Secondary | ICD-10-CM

## 2018-09-08 NOTE — Progress Notes (Signed)
Chief complaint: Follow-up for HIV on medications  Subjective:    Patient ID: [name] [name], female    DOB: [birth year], 58 y.o.   MRN: [id number]  HPI  This is a 58 year-old African-American lady with HIV, ulcerative colitis newly diagnosed primary biliary cirrhosis, hypertension usually on GENVOYA switchto Biktarvy to minimize drug drug interactions  Remained nicely suppressed on this regimen.  Actually wanted to reschedule her for 6 months time because she had a viral load that was undetectable and I felt that coming to clinic today was not worth the risk in the context of the expected surgeon novel coronavirus 2019 infections.  Nonetheless she decided to come to clinic regardless.    Past Medical History:  Diagnosis Date  . Arthritis   . Cavities 02/02/2015  . GERD (gastroesophageal reflux disease) Dx 2014  . GI bleed   . HIV (human immunodeficiency virus infection) ([place]) Dx 2015  . Hypertension Dx 2014  . Primary biliary cirrhosis ([place]) 07/03/2018  . Substance abuse ([place])    Quit 2008  . Ulcerative colitis [facility])     Past Surgical History:  Procedure Laterality Date  . ABDOMINAL HYSTERECTOMY  2001    done in [facility], patient unsure of indication  . TEE WITHOUT CARDIOVERSION N/A 05/02/2014   Procedure: TRANSESOPHAGEAL ECHOCARDIOGRAM (TEE);  Surgeon: Larey Dresser, MD;  Location: [place];  Service: Cardiovascular;  Laterality: N/A;  . VIDEO BRONCHOSCOPY Bilateral 04/27/2014   Procedure: VIDEO BRONCHOSCOPY WITH FLUORO;  Surgeon: Rigoberto Noel, MD;  [facility];  Service: Cardiopulmonary;  Laterality: Bilateral;    Family History  Problem Relation Age of Onset  . Cancer Father        ?  Marland Kitchen Alcoholism Father   . Diabetes Mother   . Stroke Mother   . Pancreatitis Sister   . Diabetes Sister   . Cancer Paternal Uncle 41       colon       Social History   Socioeconomic History  . Marital status: Single    Spouse name: Not on file  .  Number of children: Not on file  . Years of education: Not on file  . Highest education level: Not on file  Occupational History  . Not on file  Social Needs  . Financial resource strain: Not on file  . Food insecurity:    Worry: Not on file    Inability: Not on file  . Transportation needs:    Medical: Not on file    Non-medical: Not on file  Tobacco Use  . Smoking status: Former Smoker    Packs/day: 0.20    Years: 35.00    Pack years: 7.00    Types: Cigarettes  . Smokeless tobacco: Never Used  . Tobacco comment: Smoking 2-3 cigs per day  Substance and Sexual Activity  . Alcohol use: No    Alcohol/week: 0.0 standard drinks  . Drug use: No    Types: Cocaine    Comment: no drugs per patient since 2009  . Sexual activity: Yes    Birth control/protection: Other-see comments, Surgical    Comment: pt. given condoms  Lifestyle  . Physical activity:    Days per week: Not on file    Minutes per session: Not on file  . Stress: Not on file  Relationships  . Social connections:    Talks on phone: Not on file    Gets together: Not on file    Attends religious service: Not on  file    Active member of club or organization: Not on file    Attends meetings of clubs or organizations: Not on file    Relationship status: Not on file  Other Topics Concern  . Not on file  Social History Narrative  . Not on file    No Known Allergies   Current Outpatient Medications:  .  amLODipine (NORVASC) 10 MG tablet, TAKE 1 TABLET(10 MG) BY MOUTH DAILY, Disp: 90 tablet, Rfl: 0 .  atorvastatin (LIPITOR) 10 MG tablet, Take 1 tablet (10 mg total) by mouth daily., Disp: 90 tablet, Rfl: 6 .  bictegravir-emtricitabine-tenofovir AF (BIKTARVY) 50-200-25 MG TABS tablet, Take 1 tablet by mouth daily., Disp: 30 tablet, Rfl: 11 .  calcium citrate-vitamin D 500-400 MG-UNIT chewable tablet, Chew 1 tablet by mouth 2 (two) times daily., Disp: 180 tablet, Rfl: 1 .  feeding supplement, ENSURE COMPLETE, (ENSURE  COMPLETE) LIQD, Take 237 mLs by mouth 3 (three) times daily between meals., Disp: 237 mL, Rfl: 0 .  FEROSUL 325 (65 Fe) MG tablet, TAKE 1 TABLET BY MOUTH DAILY WITH BREAKFAST., Disp: 90 tablet, Rfl: 3 .  hyoscyamine (LEVSIN, ANASPAZ) 0.125 MG tablet, Take 1 tablet (0.125 mg total) by mouth every 6 (six) hours as needed., Disp: 120 tablet, Rfl: 0 .  lisinopril (PRINIVIL,ZESTRIL) 5 MG tablet, Take 1 tablet (5 mg total) by mouth daily., Disp: 90 tablet, Rfl: 0 .  sulfaSALAzine (AZULFIDINE) 500 MG tablet, Take 2 tablets (1,000 mg total) by mouth 2 (two) times daily., Disp: 120 tablet, Rfl: 6   Review of Systems  Constitutional: Negative for activity change, appetite change, chills, diaphoresis, fatigue, fever and unexpected weight change.  HENT: Negative for congestion, rhinorrhea, sinus pressure, sneezing, sore throat and trouble swallowing.   Eyes: Negative for photophobia and visual disturbance.  Respiratory: Negative for cough, shortness of breath, wheezing and stridor.   Cardiovascular: Negative for chest pain, palpitations and leg swelling.  Gastrointestinal: Negative for abdominal distention, abdominal pain, anal bleeding, blood in stool, constipation, diarrhea, nausea and vomiting.  Genitourinary: Negative for difficulty urinating, dysuria, flank pain and hematuria.  Musculoskeletal: Negative for arthralgias, back pain, gait problem, joint swelling and myalgias.  Skin: Negative for color change, pallor, rash and wound.  Neurological: Negative for dizziness, tremors, weakness and light-headedness.  Hematological: Negative for adenopathy. Does not bruise/bleed easily.  Psychiatric/Behavioral: Negative for agitation, behavioral problems, confusion, decreased concentration, dysphoric mood and sleep disturbance. The patient is not hyperactive.        Objective:   Physical Exam  Constitutional: She is oriented to person, place, and time. She appears well-developed and well-nourished. No  distress.  HENT:  Head: Normocephalic and atraumatic.  Mouth/Throat: No oropharyngeal exudate.  Eyes: EOM are normal. No scleral icterus.  Neck: Normal range of motion. Neck supple.  Cardiovascular: Normal rate and regular rhythm.  Pulmonary/Chest: Effort normal. No respiratory distress. She has no wheezes.  Abdominal: Soft. She exhibits no distension.  Musculoskeletal:        General: No tenderness or edema.  Neurological: She is alert and oriented to person, place, and time. Cranial nerve deficit: .25. She exhibits normal muscle tone. Coordination normal.  Skin: Skin is warm and dry. No rash noted. She is not diaphoretic. No erythema. No pallor.  Psychiatric: She has a normal mood and affect. Her behavior is normal. Judgment and thought content normal.  Nursing note and vitals reviewed.         Assessment & Plan:   HIV: S new with  BIKTARVY return to clinic in 6 months time. Hypertension :  Well controlled Vitals:   09/08/18 1118  BP: 114/69  Pulse: 75  Temp: 98 F (36.7 C)   UC: Sulfasalazine and well controlled  PBC: Followed by GI

## 2018-09-09 LAB — T-HELPER CELL (CD4) - (RCID CLINIC ONLY)
CD4 % Helper T Cell: 23 % — ABNORMAL LOW (ref 33–55)
CD4 T Cell Abs: 500 /uL (ref 400–2700)

## 2018-09-09 LAB — URINE CYTOLOGY ANCILLARY ONLY
Chlamydia: NEGATIVE
Neisseria Gonorrhea: NEGATIVE

## 2018-09-17 LAB — CBC WITH DIFFERENTIAL/PLATELET
Absolute Monocytes: 324 cells/uL (ref 200–950)
Basophils Absolute: 32 cells/uL (ref 0–200)
Basophils Relative: 0.7 %
EOS ABS: 140 {cells}/uL (ref 15–500)
Eosinophils Relative: 3.1 %
HCT: 33.2 % — ABNORMAL LOW (ref 35.0–45.0)
Hemoglobin: 11.3 g/dL — ABNORMAL LOW (ref 11.7–15.5)
Lymphs Abs: 2120 cells/uL (ref 850–3900)
MCH: 31.4 pg (ref 27.0–33.0)
MCHC: 34 g/dL (ref 32.0–36.0)
MCV: 92.2 fL (ref 80.0–100.0)
MPV: 10.3 fL (ref 7.5–12.5)
Monocytes Relative: 7.2 %
NEUTROS PCT: 41.9 %
Neutro Abs: 1886 cells/uL (ref 1500–7800)
Platelets: 341 10*3/uL (ref 140–400)
RBC: 3.6 10*6/uL — ABNORMAL LOW (ref 3.80–5.10)
RDW: 15.1 % — AB (ref 11.0–15.0)
Total Lymphocyte: 47.1 %
WBC: 4.5 10*3/uL (ref 3.8–10.8)

## 2018-09-17 LAB — LIPID PANEL
Cholesterol: 226 mg/dL — ABNORMAL HIGH (ref <200)
HDL: 76 mg/dL (ref >=50)
LDL Cholesterol (Calc): 134 mg/dL (calc) — ABNORMAL HIGH
Non-HDL Cholesterol (Calc): 150 mg/dL (calc) — ABNORMAL HIGH (ref <130)
Total CHOL/HDL Ratio: 3 (calc) (ref <5.0)
Triglycerides: 67 mg/dL (ref <150)

## 2018-09-17 LAB — HIV-1 RNA QUANT-NO REFLEX-BLD
HIV 1 RNA Quant: <20 copies/mL
HIV-1 RNA Quant, Log: <1.3 Log copies/mL

## 2018-09-17 LAB — RPR: RPR: NONREACTIVE

## 2018-09-18 ENCOUNTER — Ambulatory Visit: Payer: Medicare Other | Admitting: Internal Medicine

## 2018-09-22 ENCOUNTER — Ambulatory Visit: Payer: Medicare Other | Admitting: Internal Medicine

## 2018-09-22 MED FILL — ATORVASTATIN 10 MG TABLET: 10 | 90 days supply | Qty: 90 | Fill #0

## 2018-10-01 MED FILL — BIKTARVY 50-200-25 MG TABS: 50-200-25 | 30 days supply | Qty: 30 | Fill #3

## 2018-10-22 ENCOUNTER — Other Ambulatory Visit: Payer: Self-pay | Admitting: Internal Medicine

## 2018-10-22 DIAGNOSIS — I10 Essential (primary) hypertension: Secondary | ICD-10-CM

## 2018-10-22 MED FILL — sulfaSALAzine 500 MG TABS: 500 | 30 days supply | Qty: 120 | Fill #0

## 2018-10-23 MED FILL — LISINOPRIL 5 MG TABLET: 5 | 90 days supply | Qty: 90 | Fill #0

## 2018-10-23 MED FILL — AMLODIPINE BESYLATE 10 MG T: 10 | 90 days supply | Qty: 90 | Fill #0

## 2018-11-04 MED FILL — BIKTARVY 50-200-25 MG TABS: 50-200-25 | 30 days supply | Qty: 30 | Fill #4

## 2018-11-30 MED FILL — sulfaSALAzine 500 MG TABS: 500 | 30 days supply | Qty: 120 | Fill #1

## 2018-12-10 MED FILL — BIKTARVY 50-200-25 MG TABS: 50-200-25 | 30 days supply | Qty: 30 | Fill #5

## 2018-12-17 ENCOUNTER — Other Ambulatory Visit (HOSPITAL_COMMUNITY)
Admission: RE | Admit: 2018-12-17 | Discharge: 2018-12-17 | Disposition: A | Discharge status: Home / Self Care | Payer: Medicare Other | Source: Ambulatory Visit | Attending: Internal Medicine | Admitting: Internal Medicine

## 2018-12-17 ENCOUNTER — Ambulatory Visit (HOSPITAL_BASED_OUTPATIENT_CLINIC_OR_DEPARTMENT_OTHER): Payer: Medicare Other | Admitting: Internal Medicine

## 2018-12-17 ENCOUNTER — Encounter: Payer: Self-pay | Admitting: Internal Medicine

## 2018-12-17 ENCOUNTER — Other Ambulatory Visit: Payer: Self-pay

## 2018-12-17 VITALS — BP 136/83 | HR 74 | Temp 98.7°F | Resp 18 | Ht 67.0 in | Wt 123.0 lb

## 2018-12-17 DIAGNOSIS — Z1151 Encounter for screening for human papillomavirus (HPV): Secondary | ICD-10-CM | POA: Insufficient documentation

## 2018-12-17 DIAGNOSIS — Z124 Encounter for screening for malignant neoplasm of cervix: Secondary | ICD-10-CM

## 2018-12-17 DIAGNOSIS — K5190 Ulcerative colitis, unspecified, without complications: Secondary | ICD-10-CM

## 2018-12-17 DIAGNOSIS — I10 Essential (primary) hypertension: Secondary | ICD-10-CM

## 2018-12-17 DIAGNOSIS — R87612 Low grade squamous intraepithelial lesion on cytologic smear of cervix (LGSIL): Secondary | ICD-10-CM | POA: Diagnosis not present

## 2018-12-17 DIAGNOSIS — B20 Human immunodeficiency virus [HIV] disease: Secondary | ICD-10-CM

## 2018-12-17 DIAGNOSIS — K743 Primary biliary cirrhosis: Secondary | ICD-10-CM

## 2018-12-17 DIAGNOSIS — Z1239 Encounter for other screening for malignant neoplasm of breast: Secondary | ICD-10-CM

## 2018-12-17 NOTE — Progress Notes (Signed)
Patient ID: [name] [name], female    DOB: 1960/11/09  MRN: [id number]  CC: Gynecologic Exam   Subjective: [name] [name] is a 58 y.o. female who presents for chronic ds management Her concerns today include:  Patient with history of HTN, HL, IDA, GERD,LGSILwith positive HPV(neg colpo 07/10/2017),formertobacco, HIV, ulcerative colitis and calf cramps  PAP: G2P1.  History of abdominal hysterectomy. Last pap of the vagina was abnormal revealing LGSIL.  GYN had recommended a repeat Pap in 1 year.  This was the purpose of today's visit.    No dischg Post-menpausal with minimal hot flashes Not sexually active in the past 2 years Due for MMG and colon cancer screening  Patient with history of ulcerative colitis and PBC.  She also had liver MRI that had revealed several benign cysts but some findings consistent with cirrhosis.  I have tried several times to get her in with a gastroenterologist.  She had reported to me that she was seeing one in [facility] but never received any correspondence and I doubt that she was seen.  I then referred her to a gastroenterologist here in [place] but they had not been able to contact her to schedule the appointment.  Patient states she is agreeable to being seen and will call back if any messages left on her phone  HTN:  Took amlodipine and lisinopril already for today  Walking 2 x a day for 15 mins Has device and checks BP 3 x a wk. gives last reading 2 days ago is 130/54 Denies any chest pains or shortness of breath.  No lower extremity edema.   Hl: compliant with atorvastatin  HIV: Reports compliance with her antiretroviral therapy.  Followed by ID. Denies any cough, fever, sore throat Reports her appetite is stable  Patient Active Problem List   Diagnosis Date Noted  . Primary biliary cirrhosis ([place]) 07/03/2018  . LGSIL Pap smear of vagina 06/13/2017  . Immunization due 04/07/2017  . Abnormal Pap smear of anus 04/07/2017  . Calf cramp  12/17/2016  . HIV disease ([place]) 02/02/2015  . Current smoker 10/27/2014  . Hyperlipidemia 08/24/2014  . Vitamin D deficiency 08/24/2014  . S/P hysterectomy 08/23/2014  . Underweight 08/23/2014  . Ulcerative colitis ([place]) 05/27/2014  . HTN (hypertension) 05/27/2014  . Anemia of chronic disease 05/27/2014  . Insomnia 05/27/2014  . Dental caries 05/10/2014  . Cavitary lesion of lung 04/25/2014     Current Outpatient Medications on File Prior to Visit  Medication Sig Dispense Refill  . amLODipine (NORVASC) 10 MG tablet TAKE 1 TABLET (10 MG) BY MOUTH DAILY 90 tablet 0  . atorvastatin (LIPITOR) 10 MG tablet Take 1 tablet (10 mg total) by mouth daily. 90 tablet 6  . bictegravir-emtricitabine-tenofovir AF (BIKTARVY) 50-200-25 MG TABS tablet Take 1 tablet by mouth daily. 30 tablet 11  . calcium citrate-vitamin D 500-400 MG-UNIT chewable tablet Chew 1 tablet by mouth 2 (two) times daily. 180 tablet 1  . feeding supplement, ENSURE COMPLETE, (ENSURE COMPLETE) LIQD Take 237 mLs by mouth 3 (three) times daily between meals. 237 mL 0  . FEROSUL 325 (65 Fe) MG tablet TAKE 1 TABLET BY MOUTH DAILY WITH BREAKFAST. 90 tablet 3  . hyoscyamine (LEVSIN, ANASPAZ) 0.125 MG tablet Take 1 tablet (0.125 mg total) by mouth every 6 (six) hours as needed. 120 tablet 0  . lisinopril (ZESTRIL) 5 MG tablet TAKE 1 TABLET (5 MG TOTAL) BY MOUTH DAILY. 90 tablet 0  . sulfaSALAzine (AZULFIDINE) 500 MG tablet  Take 2 tablets (1,000 mg total) by mouth 2 (two) times daily. 120 tablet 6   No current facility-administered medications on file prior to visit.     No Known Allergies  Social History   Socioeconomic History  . Marital status: Single    Spouse name: Not on file  . Number of children: Not on file  . Years of education: Not on file  . Highest education level: Not on file  Occupational History  . Not on file  Social Needs  . Financial resource strain: Not on file  . Food insecurity    Worry: Not on file     Inability: Not on file  . Transportation needs    Medical: Not on file    Non-medical: Not on file  Tobacco Use  . Smoking status: Former Smoker    Packs/day: 0.20    Years: 35.00    Pack years: 7.00    Types: Cigarettes  . Smokeless tobacco: Never Used  . Tobacco comment: Smoking 2-3 cigs per day  Substance and Sexual Activity  . Alcohol use: No    Alcohol/week: 0.0 standard drinks  . Drug use: No    Types: Cocaine    Comment: no drugs per patient since 2009  . Sexual activity: Not Currently    Birth control/protection: Other-see comments, Surgical    Comment: pt. given condoms  Lifestyle  . Physical activity    Days per week: Not on file    Minutes per session: Not on file  . Stress: Not on file  Relationships  . Social Herbalist on phone: Not on file    Gets together: Not on file    Attends religious service: Not on file    Active member of club or organization: Not on file    Attends meetings of clubs or organizations: Not on file    Relationship status: Not on file  . Intimate partner violence    Fear of current or ex partner: Not on file    Emotionally abused: Not on file    Physically abused: Not on file    Forced sexual activity: Not on file  Other Topics Concern  . Not on file  Social History Narrative  . Not on file    Family History  Problem Relation Age of Onset  . Cancer Father        ?  Marland Kitchen Alcoholism Father   . Diabetes Mother   . Stroke Mother   . Pancreatitis Sister   . Diabetes Sister   . Cancer Paternal Uncle 50       colon     Past Surgical History:  Procedure Laterality Date  . ABDOMINAL HYSTERECTOMY  2001    done in [facility], patient unsure of indication  . TEE WITHOUT CARDIOVERSION N/A 05/02/2014   Procedure: TRANSESOPHAGEAL ECHOCARDIOGRAM (TEE);  Surgeon: Larey Dresser, MD;  Location: [place];  Service: Cardiovascular;  Laterality: N/A;  . VIDEO BRONCHOSCOPY Bilateral 04/27/2014   Procedure: VIDEO  BRONCHOSCOPY WITH FLUORO;  Surgeon: Rigoberto Noel, MD;  [facility];  Service: Cardiopulmonary;  Laterality: Bilateral;    ROS: Review of Systems Negative except as stated above  PHYSICAL EXAM: BP 136/83 (BP Location: Left Arm, Patient Position: Sitting, Cuff Size: Normal)   Pulse 74   Temp 98.7 F (37.1 C) (Oral)   Resp 18   Ht 5' 7"  (1.702 m)   Wt 123 lb (55.8 kg)   SpO2 100%  BMI 19.26 kg/m   Wt Readings from Last 3 Encounters:  12/17/18 123 lb (55.8 kg)  09/08/18 126 lb (57.2 kg)  07/03/18 122 lb (55.3 kg)   Physical Exam  General appearance -older African-American female in NAD  mental status - normal mood, behavior, speech, dress, motor activity, and thought processes Mouth - mucous membranes moist, pharynx normal without lesions.  Most of her teeth are missing and the ones that remain are decayed Neck - supple, no significant adenopathy Chest - clear to auscultation, no wheezes, rales or rhonchi, symmetric air entry Heart - normal rate, regular rhythm, normal S1, S2,, 1-2/6 SEM at the apex and along the sternal border Abdomen - soft, nontender, nondistended, no masses or organomegaly Breasts - breasts appear normal, no suspicious masses, no skin or nipple changes or axillary nodes Pelvic -vaginal mucosa is dry and atrophy no abnormal lesions noted.  No adnexal masses on manual exam Extremities -no lower extremity edema CMP Latest Ref Rng & Units 05/06/2018 11/07/2017 04/07/2017  Glucose 65 - 99 mg/dL 112(H) 87 87  BUN 7 - 25 mg/dL 13 11 14   Creatinine 0.50 - 1.05 mg/dL 0.83 0.77 0.92  Sodium 135 - 146 mmol/L 141 143 141  Potassium 3.5 - 5.3 mmol/L 4.0 4.8 3.8  Chloride 98 - 110 mmol/L 105 106 102  CO2 20 - 32 mmol/L 28 24 26   Calcium 8.6 - 10.4 mg/dL 9.7 10.1 9.9  Total Protein 6.1 - 8.1 g/dL 7.7 8.5 8.4  Total Bilirubin 0.2 - 1.2 mg/dL 0.6 0.6 0.4  Alkaline Phos 39 - 117 IU/L - 722(H) 715(H)  AST 10 - 35 U/L 62(H) 71(H) 67(H)  ALT 6 - 29 U/L 48(H)  66(H) 49(H)   Lipid Panel     Component Value Date/Time   CHOL 226 (H) 09/08/2018 1145   TRIG 67 09/08/2018 1145   HDL 76 09/08/2018 1145   CHOLHDL 3.0 09/08/2018 1145   VLDL 16 10/28/2016 1006   LDLCALC 134 (H) 09/08/2018 1145    CBC    Component Value Date/Time   WBC 4.5 09/08/2018 1145   RBC 3.60 (L) 09/08/2018 1145   HGB 11.3 (L) 09/08/2018 1145   HGB 11.8 11/07/2017 1002   HCT 33.2 (L) 09/08/2018 1145   HCT 34.7 11/07/2017 1002   PLT 341 09/08/2018 1145   PLT 350 11/07/2017 1002   MCV 92.2 09/08/2018 1145   MCV 93 11/07/2017 1002   MCH 31.4 09/08/2018 1145   MCHC 34.0 09/08/2018 1145   RDW 15.1 (H) 09/08/2018 1145   RDW 16.6 (H) 11/07/2017 1002   LYMPHSABS 2,120 09/08/2018 1145   MONOABS 441 10/28/2016 1006   EOSABS 140 09/08/2018 1145   BASOSABS 32 09/08/2018 1145    ASSESSMENT AND PLAN: 1. Pap smear for cervical cancer screening History of abnormal vaginal Pap in patient with HIV.  Repeat Pap done today - Cytology - PAP  2. Screening for breast cancer - MM Digital Screening; Future  3. Essential hypertension Not at goal.  Advised patient to continue to check blood pressure at least twice a week and write down the readings.  She will follow-up with clinical pharmacist in 3 weeks for recheck.  No changes made in blood pressure meds today  4. Ulcerative colitis without complications, unspecified location [facility]) - Ambulatory referral to Gastroenterology  5. Primary biliary cirrhosis ([place]) - Ambulatory referral to Gastroenterology  6. HIV disease ([place]) Stable and followed by ID   Patient was given the opportunity to ask questions.  Patient verbalized understanding of the plan and was able to repeat key elements of the plan.   Orders Placed This Encounter  Procedures  . MM Digital Screening  . Ambulatory referral to Gastroenterology     Requested Prescriptions    No prescriptions requested or ordered in this encounter    Return in about 3 months  (around 03/19/2019).  Karle Plumber, MD, FACP

## 2018-12-17 NOTE — Patient Instructions (Signed)
Please give patient an appointment with Lurena Joiner in 3 weeks for repeat blood pressure check.

## 2018-12-23 ENCOUNTER — Telehealth: Payer: Self-pay | Admitting: *Deleted

## 2018-12-23 ENCOUNTER — Other Ambulatory Visit: Payer: Self-pay | Admitting: Internal Medicine

## 2018-12-23 DIAGNOSIS — Z124 Encounter for screening for malignant neoplasm of cervix: Secondary | ICD-10-CM

## 2018-12-23 LAB — CYTOLOGY - PAP
Adequacy: ABSENT — AB
HPV 16/18/45 genotyping: POSITIVE — AB
HPV: DETECTED — AB

## 2018-12-23 NOTE — Telephone Encounter (Signed)
-----   Message from Ladell Pier, MD sent at 12/23/2018  1:33 PM EDT ----- Please let pt know that her pap smear came back abnormal and we will need to refer her to the gynecologist again.

## 2018-12-23 NOTE — Telephone Encounter (Signed)
Patient verified DOB Patient is aware of pap showing abnormalities and being referred back to gynecology.  No further questions.

## 2018-12-31 MED FILL — ATORVASTATIN 10 MG TABLET: 10 | 90 days supply | Qty: 90 | Fill #1

## 2019-01-17 NOTE — Progress Notes (Deleted)
TELEHEALTH VISIT  Referring Provider: Ladell Pier, MD Primary Care Physician:  Ladell Pier, MD   Tele-visit due to COVID-19 pandemic Patient requested visit virtually, consented to the virtual encounter via video enabled telemedicine application Contact made at:  Patient verified by name and date of birth Location of patient: Home Location provider: [place] [facility] Names of persons participating: Me, patient, Tinnie Gens CMA Time spent on telehealth visit:  I discussed the limitations of evaluation and management by telemedicine. The patient expressed understanding and agreed to proceed.  Reason for Consultation:  UC, PBC   IMPRESSION:  ***  PLAN: ***  Please see the "Patient Instructions" section for addition details about the plan.  HPI: [name] [name] is a 58 y.o. female  HTN, HL, IDA, GERD,LGSILwith positive HPV(neg colpo 07/10/2017),formertobacco, HIV, ulcerative colitis and calf cramps Seen by Dr. Sharlett Iles in 2016.  She has a history of drug abuse, AIDS, hypertension and a cavitary lesion of the lung.  She was told that she has colitis while recently hospitalized in [facility] although she is unsure whether she had a colonoscopy.  A CT scan in November, 2015, demonstrated thickening of the left colon.  She complains of chronic abdominal pain.  Stools are intermittently loose and she has had some limited rectal bleeding.  On May 16, 2014 hemoglobin was 8. CT scan in November, 2015 demonstrated thickening of the left colon.  C. difficile toxin was negative.  She was apparently hospitalized in [facility] last month and was told she has ulcerative colitis but she can provide no details.  Patient claims have chronic abdominal pain that is not controlled with Tylenol No. 3.  She's asking for "stronger meds".   No known family history of colon cancer or polyps. No family history of uterine/endometrial cancer, pancreatic cancer or gastric/stomach  cancer.  Past Medical History:  Diagnosis Date  . Arthritis   . Cavities 02/02/2015  . GERD (gastroesophageal reflux disease) Dx 2014  . GI bleed   . HIV (human immunodeficiency virus infection) ([place]) Dx 2015  . Hypertension Dx 2014  . Primary biliary cirrhosis ([place]) 07/03/2018  . Substance abuse ([place])    Quit 2008  . Ulcerative colitis [facility])     Past Surgical History:  Procedure Laterality Date  . ABDOMINAL HYSTERECTOMY  2001    done in [facility], patient unsure of indication  . TEE WITHOUT CARDIOVERSION N/A 05/02/2014   Procedure: TRANSESOPHAGEAL ECHOCARDIOGRAM (TEE);  Surgeon: Larey Dresser, MD;  Location: [place];  Service: Cardiovascular;  Laterality: N/A;  . VIDEO BRONCHOSCOPY Bilateral 04/27/2014   Procedure: VIDEO BRONCHOSCOPY WITH FLUORO;  Surgeon: Rigoberto Noel, MD;  [facility];  Service: Cardiopulmonary;  Laterality: Bilateral;    Current Outpatient Medications  Medication Sig Dispense Refill  . amLODipine (NORVASC) 10 MG tablet TAKE 1 TABLET (10 MG) BY MOUTH DAILY 90 tablet 0  . atorvastatin (LIPITOR) 10 MG tablet Take 1 tablet (10 mg total) by mouth daily. 90 tablet 6  . bictegravir-emtricitabine-tenofovir AF (BIKTARVY) 50-200-25 MG TABS tablet Take 1 tablet by mouth daily. 30 tablet 11  . calcium citrate-vitamin D 500-400 MG-UNIT chewable tablet Chew 1 tablet by mouth 2 (two) times daily. 180 tablet 1  . feeding supplement, ENSURE COMPLETE, (ENSURE COMPLETE) LIQD Take 237 mLs by mouth 3 (three) times daily between meals. 237 mL 0  . FEROSUL 325 (65 Fe) MG tablet TAKE 1 TABLET BY MOUTH DAILY WITH BREAKFAST. 90 tablet 3  . hyoscyamine (  LEVSIN, ANASPAZ) 0.125 MG tablet Take 1 tablet (0.125 mg total) by mouth every 6 (six) hours as needed. 120 tablet 0  . lisinopril (ZESTRIL) 5 MG tablet TAKE 1 TABLET (5 MG TOTAL) BY MOUTH DAILY. 90 tablet 0  . sulfaSALAzine (AZULFIDINE) 500 MG tablet Take 2 tablets (1,000 mg total) by mouth 2 (two) times  daily. 120 tablet 6   No current facility-administered medications for this visit.     Allergies as of 01/18/2019  . (No Known Allergies)    Family History  Problem Relation Age of Onset  . Cancer Father        ?  Marland Kitchen Alcoholism Father   . Diabetes Mother   . Stroke Mother   . Pancreatitis Sister   . Diabetes Sister   . Cancer Paternal Uncle 31       colon     Social History   Socioeconomic History  . Marital status: Single    Spouse name: Not on file  . Number of children: Not on file  . Years of education: Not on file  . Highest education level: Not on file  Occupational History  . Not on file  Social Needs  . Financial resource strain: Not on file  . Food insecurity    Worry: Not on file    Inability: Not on file  . Transportation needs    Medical: Not on file    Non-medical: Not on file  Tobacco Use  . Smoking status: Former Smoker    Packs/day: 0.20    Years: 35.00    Pack years: 7.00    Types: Cigarettes  . Smokeless tobacco: Never Used  . Tobacco comment: Smoking 2-3 cigs per day  Substance and Sexual Activity  . Alcohol use: No    Alcohol/week: 0.0 standard drinks  . Drug use: No    Types: Cocaine    Comment: no drugs per patient since 2009  . Sexual activity: Not Currently    Birth control/protection: Other-see comments, Surgical    Comment: pt. given condoms  Lifestyle  . Physical activity    Days per week: Not on file    Minutes per session: Not on file  . Stress: Not on file  Relationships  . Social Herbalist on phone: Not on file    Gets together: Not on file    Attends religious service: Not on file    Active member of club or organization: Not on file    Attends meetings of clubs or organizations: Not on file    Relationship status: Not on file  . Intimate partner violence    Fear of current or ex partner: Not on file    Emotionally abused: Not on file    Physically abused: Not on file    Forced sexual activity: Not on  file  Other Topics Concern  . Not on file  Social History Narrative  . Not on file    Review of Systems: ALL ROS discussed and all others negative except listed in HPI.  Physical Exam: Complete physical exam not performed due to the limits inherent in a telehealth encounter.  General: Awake, alert, and oriented, and well communicative. In no acute distress.  HEENT: EOMI, non-icteric sclera, NCAT, MMM  Neck: Normal movement of head and neck  Pulm: No labored breathing, speaking in full sentences without conversational dyspnea  Derm: No apparent lesions or bruising in visible field  MS: Moves all visible extremities without noticeable abnormality  Psych: Pleasant, cooperative, normal speech, normal affect and normal insight Neuro: Alert and appropriate   Elverna Caffee L. Tarri Glenn, MD, MPH [facility] 01/17/2019, 8:52 PM

## 2019-01-18 ENCOUNTER — Encounter: Payer: Self-pay | Admitting: Gastroenterology

## 2019-01-18 ENCOUNTER — Ambulatory Visit: Payer: Medicare Other | Admitting: Gastroenterology

## 2019-01-23 MED FILL — ATORVASTATIN 10 MG TABLET: 10 | 90 days supply | Qty: 90 | Fill #1

## 2019-01-27 ENCOUNTER — Telehealth: Payer: Self-pay | Admitting: Emergency Medicine

## 2019-01-27 NOTE — Telephone Encounter (Signed)
Pt called and left a message on the nurse voicemail line stating that she received a letter in the mail and she wanted to know what it was about.    Pt chart reviewed and pt phone call was returned. Pt was informed that she has an appointment on 9/10. Pt was informed that after her PCP did her pap smear on 7/2, she was referred to our clinic for further evaluation. Pt verbalized understanding and had no further questions.

## 2019-02-10 ENCOUNTER — Ambulatory Visit: Payer: Medicare Other

## 2019-02-17 NOTE — Progress Notes (Signed)
Referring Provider: Ladell Pier, MD Primary Care Physician:  Ladell Pier, MD  Reason for Consultation:  UC, biliary cirrhosis   IMPRESSION:  Ulcerative colitis    - Abnormal CT scan 04/2014: thickened left colon    - diagnosed with UC while hospitalized in [facility] 05/2014 Postprandial bloating and distension GERD Referral records mention PBC - but the patient is unaware of this diagnosis Abnormal ultrasound and liver MRI    - CT a/p with contrast at [facility] 11/13/17:        - enteritis, mild gastritis, mild hepatomegaly, liver lesions    - ultrasound 05/08/18: 1.3 cm solid mass in the left liver lobe       - 0.6 cm irregular hypoechoic mass in the right liver lobe       - 1.2 cm benign appearing cyst in the right liver lobe    - MRI 05/30/18: suspected cirrhosis       - 5 small lesion in the liver, likely small cysts Abnormal liver enzymes - alk phos>>>transaminases    -  Alk phos elevated dating back to 2015      - magnitude of elevation has increased in the last couple of years.          - Transaminases are mildly elevated. Total bilirubin is normal.     - HCV and HBV serologies negative 2015 HIV on BIKTARVY   Markedly elevated alk phos with mildly elevated transaminases. Viral testing in 2015 was negative. Electronic record mentions PBC but the patient is not aware of this diagnosis. Serologic evaluation for autoimmune hepatitis, sarcoid, and PBC and repeat imaging of the bile ducts is recommended.   Possible cirrhosis on MRI without any history of decompensation. Platelets are 306. FIB-4 1.56 and APRI 0.450 do not support a diagnosis of cirrhosis. Will repeat labs and imaging today for both diagnosis and staging.  Unusual history of ulcerative colitis. Will work to obtain prior records. Continue sulfasalazine at this time. Plan colonoscopy for further evaluation.   EGD recommended to evaluate for post-prandial abdominal pain, bloating, recent weight loss, and  GERD.  Differential is broad, particularly given her history of HIV. Empiric trial of PPI therapy in the meantime.     PLAN: - MRI/MRCP - Start pantoprazole 40 mg daily - CMP, GGTP, AMA, ANA, IgG, IgM, ACE level, AFP, CBC, INR - EGD and colonoscopy - She will call me with the name of her prior gastroenterologist so that we can obtain those prior records  I consented the patient today discussing the risks, benefits, and alternatives to endoscopic evaluation. In particular, we discussed the risks that include, but are not limited to, reaction to medication, cardiopulmonary compromise, bleeding requiring blood transfusion, aspiration resulting in pneumonia, perforation requiring surgery, lack of diagnosis, severe illness requiring hospitalization, and even death. We reviewed the risk of missed lesion including polyps or even cancer. The patient acknowledges these risks and asks that we proceed.  Please see the "Patient Instructions" section for addition details about the plan.  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 Lives in [place]. Previously followed by a gastroenterologist in [facility] but she cannot remember their name. 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   She has a history of HTN, HL, IDA, GERD,LGSILwith positive HPV(neg colpo 07/10/2017),formertobacco, HIV on BIKTARVY, cavitary lung lesion, ulcerative colitis and calf cramps. History of substance abuse but has not used any drugs for 3 years. She has a  history of hepatitis C in the chart, but, she was not aware of this diagnosis. The only labs available to me show a negative HCV antibody in 2015.  Her prior concerns today:  (1) Ulcerative colitis diagnosed 5-6 years ago at the time of GI bleed. She was treating the pain with Aleve. She has been on sulfasalazine since that time. Baseline bowel habits are one formed BM every day to every other day. She also takes daily iron. Wonders if  she needs to do anything else about this.   (2) She has post-prandial bloating and distension that started one month ago. Occurs within 5-10 minutes of eating. She has been treating this with Mylanta and Tums with some relief.  Reduced her food intake to minimize her symptoms. No specific food triggers. No sitophobia. Has lost approximately 6-7 pounds over the last month.   (3) She has acid reflux. Worse in the morning. No dysphagia or odynophagia. No cough or nocturnal symptoms. No sore throat, dysphonia, neck pain, or dysgeusia.   (4) Abnormal liver enzymes. Alk phos elevated dating back in 2015, although the magnitude of elevation has increased in the last couple of years. Transaminases are mildly elevated. Total bilirubin is normal. No associated symptoms. No identified exacerbating or relieving features. She is not aware of a diagnosis of PBC although this is included in the problem list in her referral records.   Viral testing in 2015 showed HAV Ab +, but testing for HBV and HCV was negative.   Prior records from her evaluation with Dr. Deatra Ina in 2016: She has a history of drug abuse, AIDS, hypertension and a cavitary lesion of the lung.  She was told that she has colitis while recently hospitalized in [facility] although she is unsure whether she had a colonoscopy.  A CT scan in November, 2015, demonstrated thickening of the left colon.  She complains of chronic abdominal pain.  Stools are intermittently loose and she has had some limited rectal bleeding.  On May 16, 2014 hemoglobin was 8. CT scan in November, 2015 demonstrated thickening of the left colon.  C. difficile toxin was negative.  She was apparently hospitalized in [facility] last month and was told she has ulcerative colitis but she can provide no details.  Patient claims have chronic abdominal pain that is not controlled with Tylenol No. 3.  She's asking for "stronger meds". Before embarking on any workup we need to review her  prior records.  She has been given pain medications through the clinic here in [place] and I told her that I will not prescribe any further pain medications.  The patient did not follow-up. It does not appear that the records from [facility] were ever located. I am unable to find them in [place].  Brother with prostate cancer. Father with stomach cancer. Paternal uncle with cancer of unknown etiology. No known family history of colon cancer or polyps. No other family history of uterine/endometrial cancer, pancreatic cancer or gastric/stomach cancer.  Prior colonoscopy about 2 years at [facility]. However, I am unable to locate this in her records.    Labs 09/08/18: WBC 4.5, hgb 11.3, platelets 341, MCV 92.2, RDW 15.1.  Prior abdominal imaging:  Ultrasound 05/08/18:  1. There is a 1.3 cm solid appearing mass in the anterior aspect of the left lobe of the liver that has increased in size from 0.6 cm on 11/13/2017. This is concerning for a primary or metastatic neoplasm. This could be better evaluated with  pre and postcontrast MRI of the liver. 2. A previously demonstrated 2nd possibly solid mass in the periphery of the right lobe of the liver, laterally, could not be visualized today. This could also represent a primary or metastatic neoplasm. 3. A 0.6 cm irregular hypoechoic mass in the right lobe of the liver is most likely a complicated cyst. A solid neoplasm is less likely. 4. 1.2 cm benign appearing cyst in the posterior aspect of the right lobe of the liver.  MRI Liver 05/30/18 with [facility]: IMPRESSION: 1. There are 5 nonenhancing liver lesions compatible with cysts. One of these is in the left hepatic lobe and is a candidate structure for the lesion seen at ultrasound. At ultrasound the lesion had some faint internal echoes and the possibility of a faintly complex cyst is noted, but the lack of enhancement would seem to argue against malignancy. 2. Scattered  peripheral small foci of early arterial phase enhancement in the liver are likely from small vascular malformations. These resolve on later arterial phase and subsequent imaging, without capsule or washout appearance to suggest hepatocellular carcinoma. 3. Reticular enhancement throughout the liver on arterial phase images favoring diffuse fibrosis and underlying cirrhosis. 4.  Aortic Atherosclerosis.   Past Medical History:  Diagnosis Date   Arthritis    Cavities 02/02/2015   GERD (gastroesophageal reflux disease) Dx 2014   GI bleed    HIV (human immunodeficiency virus infection) ([place]) Dx 2015   Hypertension Dx 2014   Primary biliary cirrhosis ([place]) 07/03/2018   Substance abuse ([place])    Quit 2008   Ulcerative colitis [facility])     Past Surgical History:  Procedure Laterality Date   ABDOMINAL HYSTERECTOMY  2001    done in [facility], patient unsure of indication   TEE WITHOUT CARDIOVERSION N/A 05/02/2014   Procedure: TRANSESOPHAGEAL ECHOCARDIOGRAM (TEE);  Surgeon: Larey Dresser, MD;  Location: [place];  Service: Cardiovascular;  Laterality: N/A;   VIDEO BRONCHOSCOPY Bilateral 04/27/2014   Procedure: VIDEO BRONCHOSCOPY WITH FLUORO;  Surgeon: Rigoberto Noel, MD;  Location: [facility];  Service: Cardiopulmonary;  Laterality: Bilateral;    Current Outpatient Medications  Medication Sig Dispense Refill   amLODipine (NORVASC) 10 MG tablet TAKE 1 TABLET (10 MG) BY MOUTH DAILY 90 tablet 0   atorvastatin (LIPITOR) 10 MG tablet Take 1 tablet (10 mg total) by mouth daily. 90 tablet 6   bictegravir-emtricitabine-tenofovir AF (BIKTARVY) 50-200-25 MG TABS tablet Take 1 tablet by mouth daily. 30 tablet 11   calcium citrate-vitamin D 500-400 MG-UNIT chewable tablet Chew 1 tablet by mouth 2 (two) times daily. 180 tablet 1   feeding supplement, ENSURE COMPLETE, (ENSURE COMPLETE) LIQD Take 237 mLs by mouth 3 (three) times daily between meals. 237 mL 0   FEROSUL 325  (65 Fe) MG tablet TAKE 1 TABLET BY MOUTH DAILY WITH BREAKFAST. 90 tablet 3   hyoscyamine (LEVSIN, ANASPAZ) 0.125 MG tablet Take 1 tablet (0.125 mg total) by mouth every 6 (six) hours as needed. 120 tablet 0   lisinopril (ZESTRIL) 5 MG tablet TAKE 1 TABLET (5 MG TOTAL) BY MOUTH DAILY. 90 tablet 0   sulfaSALAzine (AZULFIDINE) 500 MG tablet Take 2 tablets (1,000 mg total) by mouth 2 (two) times daily. 120 tablet 6   No current facility-administered medications for this visit.     Allergies as of 03/01/2019   (No Known Allergies)    Family History  Problem Relation Age of Onset   Cancer Father        ?  Alcoholism Father    Diabetes Mother    Stroke Mother    Pancreatitis Sister    Diabetes Sister    Cancer Paternal Uncle 69       colon     Social History   Socioeconomic History   Marital status: Single    Spouse name: Not on file   Number of children: Not on file   Years of education: Not on file   Highest education level: Not on file  Occupational History   Not on file  Social Needs   Financial resource strain: Not on file   Food insecurity    Worry: Not on file    Inability: Not on file   Transportation needs    Medical: Not on file    Non-medical: Not on file  Tobacco Use   Smoking status: Former Smoker    Packs/day: 0.20    Years: 35.00    Pack years: 7.00    Types: Cigarettes   Smokeless tobacco: Never Used   Tobacco comment: Smoking 2-3 cigs per day  Substance and Sexual Activity   Alcohol use: No    Alcohol/week: 0.0 standard drinks   Drug use: No    Types: Cocaine    Comment: no drugs per patient since 2009   Sexual activity: Not Currently    Birth control/protection: Other-see comments, Surgical    Comment: pt. given condoms  Lifestyle   Physical activity    Days per week: Not on file    Minutes per session: Not on file   Stress: Not on file  Relationships   Social connections    Talks on phone: Not on file    Gets  together: Not on file    Attends religious service: Not on file    Active member of club or organization: Not on file    Attends meetings of clubs or organizations: Not on file    Relationship status: Not on file   Intimate partner violence    Fear of current or ex partner: Not on file    Emotionally abused: Not on file    Physically abused: Not on file    Forced sexual activity: Not on file  Other Topics Concern   Not on file  Social History Narrative   Not on file    Review of Systems: 12 system ROS is negative except as noted above lower extremity edema.   Physical Exam: General:   Alert,  well-nourished, pleasant and cooperative in NAD. Diffuse loss of muscle mass.  Head:  Normocephalic and atraumatic. Eyes:  Sclera clear, no icterus.   Conjunctiva pink. Ears:  Normal auditory acuity. Nose:  No deformity, discharge,  or lesions. Mouth:  No deformity or lesions.   Neck:  Supple; no masses or thyromegaly. Lungs:  Clear throughout to auscultation.   No wheezes. Heart:  Regular rate and rhythm; no murmurs. Abdomen:  Thin, soft,nontender, nondistended, normal bowel sounds, no rebound or guarding. No hepatosplenomegaly.  No fluid wave.  Rectal:  Deferred  Msk:  Symmetrical. No boney deformities. LAD: No inguinal or umbilical LAD. Extremities:  No clubbing or edema. Neurologic:  Alert and  oriented x4;  grossly nonfocal Skin:  Intact without significant lesions or rashes. No palmar erythema or spider angioma.  Psych:  Alert and cooperative. Normal mood and affect.    Terrian Sentell L. Tarri Glenn, MD, MPH 02/17/2019, 11:53 AM

## 2019-02-18 ENCOUNTER — Telehealth: Payer: Self-pay | Admitting: Family Medicine

## 2019-02-18 NOTE — Telephone Encounter (Signed)
Patient appt was rescheduled due to changes with the provider schedule

## 2019-02-23 ENCOUNTER — Other Ambulatory Visit: Payer: Medicare Other

## 2019-02-25 ENCOUNTER — Ambulatory Visit: Payer: Medicare Other | Admitting: Obstetrics and Gynecology

## 2019-03-01 ENCOUNTER — Encounter: Payer: Self-pay | Admitting: Gastroenterology

## 2019-03-01 ENCOUNTER — Ambulatory Visit (INDEPENDENT_AMBULATORY_CARE_PROVIDER_SITE_OTHER): Payer: Medicare Other | Admitting: Gastroenterology

## 2019-03-01 ENCOUNTER — Other Ambulatory Visit (INDEPENDENT_AMBULATORY_CARE_PROVIDER_SITE_OTHER): Payer: Medicare Other

## 2019-03-01 ENCOUNTER — Other Ambulatory Visit: Payer: Self-pay | Admitting: Internal Medicine

## 2019-03-01 VITALS — BP 118/72 | HR 72 | Temp 98.6°F | Ht 67.75 in | Wt 122.1 lb

## 2019-03-01 DIAGNOSIS — R140 Abdominal distension (gaseous): Secondary | ICD-10-CM

## 2019-03-01 DIAGNOSIS — R945 Abnormal results of liver function studies: Secondary | ICD-10-CM

## 2019-03-01 DIAGNOSIS — K5100 Ulcerative (chronic) pancolitis without complications: Secondary | ICD-10-CM

## 2019-03-01 DIAGNOSIS — R748 Abnormal levels of other serum enzymes: Secondary | ICD-10-CM

## 2019-03-01 DIAGNOSIS — R932 Abnormal findings on diagnostic imaging of liver and biliary tract: Secondary | ICD-10-CM

## 2019-03-01 DIAGNOSIS — K743 Primary biliary cirrhosis: Secondary | ICD-10-CM | POA: Diagnosis not present

## 2019-03-01 DIAGNOSIS — K219 Gastro-esophageal reflux disease without esophagitis: Secondary | ICD-10-CM

## 2019-03-01 DIAGNOSIS — I10 Essential (primary) hypertension: Secondary | ICD-10-CM

## 2019-03-01 LAB — COMPREHENSIVE METABOLIC PANEL
ALT: 41 U/L — ABNORMAL HIGH (ref 0–35)
AST: 51 U/L — ABNORMAL HIGH (ref 0–37)
Albumin: 4.6 g/dL (ref 3.5–5.2)
Alkaline Phosphatase: 532 U/L — ABNORMAL HIGH (ref 39–117)
BUN: 23 mg/dL (ref 6–23)
CO2: 28 mEq/L (ref 19–32)
Calcium: 10.6 mg/dL — ABNORMAL HIGH (ref 8.4–10.5)
Chloride: 103 mEq/L (ref 96–112)
Creatinine, Ser: 0.89 mg/dL (ref 0.40–1.20)
GFR: 78.28 mL/min (ref >60.00)
Glucose, Bld: 88 mg/dL (ref 70–99)
Potassium: 3.8 mEq/L (ref 3.5–5.1)
Sodium: 140 mEq/L (ref 135–145)
Total Bilirubin: 0.5 mg/dL (ref 0.2–1.2)
Total Protein: 8.6 g/dL — ABNORMAL HIGH (ref 6.0–8.3)

## 2019-03-01 LAB — CBC WITH DIFFERENTIAL/PLATELET
Basophils Absolute: 0 10*3/uL (ref 0.0–0.1)
Basophils Relative: 0.6 % (ref 0.0–3.0)
Eosinophils Absolute: 0.1 10*3/uL (ref 0.0–0.7)
Eosinophils Relative: 2.8 % (ref 0.0–5.0)
HCT: 39.1 % (ref 36.0–46.0)
Hemoglobin: 13.4 g/dL (ref 12.0–15.0)
Lymphocytes Relative: 42.9 % (ref 12.0–46.0)
Lymphs Abs: 2.3 10*3/uL (ref 0.7–4.0)
MCHC: 34.2 g/dL (ref 30.0–36.0)
MCV: 94 fl (ref 78.0–100.0)
Monocytes Absolute: 0.4 10*3/uL (ref 0.1–1.0)
Monocytes Relative: 7.2 % (ref 3.0–12.0)
Neutro Abs: 2.5 10*3/uL (ref 1.4–7.7)
Neutrophils Relative %: 46.5 % (ref 43.0–77.0)
Platelets: 306 10*3/uL (ref 150.0–400.0)
RBC: 4.16 Mil/uL (ref 3.87–5.11)
RDW: 14.5 % (ref 11.5–15.5)
WBC: 5.4 10*3/uL (ref 4.0–10.5)

## 2019-03-01 LAB — GAMMA GT: GGT: 471 U/L — ABNORMAL HIGH (ref 7–51)

## 2019-03-01 LAB — PROTIME-INR
INR: 1 ratio (ref 0.8–1.0)
Prothrombin Time: 12 s (ref 9.6–13.1)

## 2019-03-01 MED ORDER — PANTOPRAZOLE SODIUM 40 MG PO TBEC: 40.0000 mg | DELAYED_RELEASE_TABLET | Freq: Every day | ORAL | 3 refills | Status: DC

## 2019-03-01 MED ORDER — NA SULFATE-K SULFATE-MG SULF 17.5-3.13-1.6 GM/177ML PO SOLN: 1.0000 | ORAL | 0 refills | Status: DC

## 2019-03-01 MED FILL — PANTOPRAZOLE SOD DR 40 MG T: 40 | 30 days supply | Qty: 30 | Fill #0

## 2019-03-01 MED FILL — SUPREP BOWEL PREP KIT: 17.5-3.13-1 | 1 days supply | Qty: 354 | Fill #0

## 2019-03-01 MED FILL — BIKTARVY 50-200-25 MG TABS: 50-200-25 | 30 days supply | Qty: 30 | Fill #6

## 2019-03-01 NOTE — Patient Instructions (Signed)
I am recommending some labs and another MRI/MRCP to follow-up on your high liver tests and abnormal ultrasound and MRI from last year.  Please start pantoprazole 40 mg daily taken 30-60 minutes prior to breakfast. I am hoping this will help with your heartburn and potentially with your bloating and distension.   I have recommended an EGD and a colonoscopy for further evaluation of your symptoms.  Please call me with the name of your prior gastroenterologist so that we can obtain all of your prior records.   Let's plan to follow-up in the office in 4-6 weeks to review all of these results. Please call with any questions or concerns prior to that time.

## 2019-03-02 LAB — MITOCHONDRIAL ANTIBODIES: Mitochondrial M2 Ab, IgG: 215.5 U — ABNORMAL HIGH

## 2019-03-02 MED FILL — LISINOPRIL 5 MG TABLET: 5 | 90 days supply | Qty: 90 | Fill #0

## 2019-03-03 LAB — ANTI-NUCLEAR AB-TITER (ANA TITER)
ANA TITER: 1:1280 {titer} — ABNORMAL HIGH
ANA Titer 1: 1:1280 {titer} — ABNORMAL HIGH

## 2019-03-03 LAB — ANA: Anti Nuclear Antibody (ANA): POSITIVE — AB

## 2019-03-03 LAB — IGG: IgG (Immunoglobin G), Serum: 1793 mg/dL — ABNORMAL HIGH (ref 600–1640)

## 2019-03-03 LAB — IGM: IgM, Serum: 121 mg/dL (ref 50–300)

## 2019-03-03 LAB — ANGIOTENSIN CONVERTING ENZYME: Angiotensin-Converting Enzyme: <5 U/L — ABNORMAL LOW (ref 9–67)

## 2019-03-03 LAB — AFP TUMOR MARKER: AFP-Tumor Marker: 2.6 ng/mL

## 2019-03-04 ENCOUNTER — Other Ambulatory Visit: Payer: Self-pay | Admitting: *Deleted

## 2019-03-04 DIAGNOSIS — K743 Primary biliary cirrhosis: Secondary | ICD-10-CM

## 2019-03-09 ENCOUNTER — Ambulatory Visit (HOSPITAL_COMMUNITY): Admission: RE | Admit: 2019-03-09 | Payer: Medicare Other | Source: Ambulatory Visit

## 2019-03-09 ENCOUNTER — Other Ambulatory Visit: Payer: Self-pay | Admitting: Gastroenterology

## 2019-03-09 ENCOUNTER — Encounter: Payer: Medicare Other | Admitting: Infectious Disease

## 2019-03-09 DIAGNOSIS — R140 Abdominal distension (gaseous): Secondary | ICD-10-CM

## 2019-03-09 DIAGNOSIS — R945 Abnormal results of liver function studies: Secondary | ICD-10-CM

## 2019-03-09 MED FILL — sulfaSALAzine 500 MG TABS: 500 | 30 days supply | Qty: 120 | Fill #2

## 2019-03-11 ENCOUNTER — Other Ambulatory Visit: Payer: Self-pay | Admitting: Radiology

## 2019-03-12 ENCOUNTER — Other Ambulatory Visit: Payer: Self-pay | Admitting: Radiology

## 2019-03-15 ENCOUNTER — Ambulatory Visit (HOSPITAL_COMMUNITY)
Admission: RE | Admit: 2019-03-15 | Discharge: 2019-03-15 | Disposition: A | Discharge status: Home / Self Care | Payer: Medicare Other | Source: Ambulatory Visit | Attending: Gastroenterology | Admitting: Gastroenterology

## 2019-03-15 ENCOUNTER — Other Ambulatory Visit: Payer: Self-pay

## 2019-03-15 ENCOUNTER — Encounter (HOSPITAL_COMMUNITY): Payer: Self-pay

## 2019-03-15 DIAGNOSIS — K219 Gastro-esophageal reflux disease without esophagitis: Secondary | ICD-10-CM | POA: Insufficient documentation

## 2019-03-15 DIAGNOSIS — K743 Primary biliary cirrhosis: Secondary | ICD-10-CM | POA: Diagnosis not present

## 2019-03-15 DIAGNOSIS — Z90710 Acquired absence of both cervix and uterus: Secondary | ICD-10-CM | POA: Diagnosis not present

## 2019-03-15 DIAGNOSIS — Z87891 Personal history of nicotine dependence: Secondary | ICD-10-CM | POA: Diagnosis not present

## 2019-03-15 DIAGNOSIS — E785 Hyperlipidemia, unspecified: Secondary | ICD-10-CM | POA: Diagnosis not present

## 2019-03-15 DIAGNOSIS — Z79899 Other long term (current) drug therapy: Secondary | ICD-10-CM | POA: Insufficient documentation

## 2019-03-15 DIAGNOSIS — I10 Essential (primary) hypertension: Secondary | ICD-10-CM | POA: Diagnosis not present

## 2019-03-15 DIAGNOSIS — R7989 Other specified abnormal findings of blood chemistry: Secondary | ICD-10-CM | POA: Diagnosis not present

## 2019-03-15 DIAGNOSIS — Z833 Family history of diabetes mellitus: Secondary | ICD-10-CM | POA: Diagnosis not present

## 2019-03-15 DIAGNOSIS — Z21 Asymptomatic human immunodeficiency virus [HIV] infection status: Secondary | ICD-10-CM | POA: Diagnosis not present

## 2019-03-15 DIAGNOSIS — Z809 Family history of malignant neoplasm, unspecified: Secondary | ICD-10-CM | POA: Diagnosis not present

## 2019-03-15 DIAGNOSIS — F1911 Other psychoactive substance abuse, in remission: Secondary | ICD-10-CM | POA: Diagnosis not present

## 2019-03-15 DIAGNOSIS — Z800 Family history of malignant neoplasm of digestive organs: Secondary | ICD-10-CM | POA: Diagnosis not present

## 2019-03-15 LAB — CBC
HCT: 37.1 % (ref 36.0–46.0)
Hemoglobin: 12.5 g/dL (ref 12.0–15.0)
MCH: 31.3 pg (ref 26.0–34.0)
MCHC: 33.7 g/dL (ref 30.0–36.0)
MCV: 92.8 fL (ref 80.0–100.0)
Platelets: 293 10*3/uL (ref 150–400)
RBC: 4 MIL/uL (ref 3.87–5.11)
RDW: 14.8 % (ref 11.5–15.5)
WBC: 5.2 10*3/uL (ref 4.0–10.5)
nRBC: 0 % (ref 0.0–0.2)

## 2019-03-15 LAB — PROTIME-INR
INR: 0.9 (ref 0.8–1.2)
Prothrombin Time: 12.3 seconds (ref 11.4–15.2)

## 2019-03-15 MED ORDER — GELATIN ABSORBABLE 12-7 MM EX MISC
CUTANEOUS | Status: AC
  Filled 2019-03-15: qty 1

## 2019-03-15 MED ORDER — MIDAZOLAM HCL 2 MG/2ML IJ SOLN
INTRAMUSCULAR | Status: AC | PRN
  Administered 2019-03-15 (×2): 1 mg via INTRAVENOUS

## 2019-03-15 MED ORDER — LIDOCAINE HCL (PF) 1 % IJ SOLN
INTRAMUSCULAR | Status: AC | PRN
  Administered 2019-03-15: 10 mL

## 2019-03-15 MED ORDER — FENTANYL CITRATE (PF) 100 MCG/2ML IJ SOLN
INTRAMUSCULAR | Status: AC
  Filled 2019-03-15: qty 2

## 2019-03-15 MED ORDER — SODIUM CHLORIDE 0.9 % IV SOLN
INTRAVENOUS | Status: DC
  Administered 2019-03-15: 20 mL/h via INTRAVENOUS

## 2019-03-15 MED ORDER — MIDAZOLAM HCL 2 MG/2ML IJ SOLN
INTRAMUSCULAR | Status: AC
  Filled 2019-03-15: qty 4

## 2019-03-15 MED ORDER — FENTANYL CITRATE (PF) 100 MCG/2ML IJ SOLN
INTRAMUSCULAR | Status: AC | PRN
  Administered 2019-03-15 (×2): 50 ug via INTRAVENOUS

## 2019-03-15 MED ORDER — LIDOCAINE HCL 1 % IJ SOLN
INTRAMUSCULAR | Status: AC
  Filled 2019-03-15: qty 20

## 2019-03-15 NOTE — Discharge Instructions (Signed)
Liver Biopsy, Care After These instructions give you information on caring for yourself after your procedure. Your doctor may also give you more specific instructions. Call your doctor if you have any problems or questions after your procedure. What can I expect after the procedure? After the procedure, it is common to have:  Pain and soreness where the biopsy was done.  Bruising around the area where the biopsy was done.  Sleepiness and be tired for a few days. Follow these instructions at home: Medicines  Take over-the-counter and prescription medicines only as told by your doctor.  If you were prescribed an antibiotic medicine, take it as told by your doctor. Do not stop taking the antibiotic even if you start to feel better.  Do not take medicines such as aspirin and ibuprofen. These medicines can thin your blood. Do not take these medicines unless your doctor tells you to take them.  If you are taking prescription pain medicine, take actions to prevent or treat constipation. Your doctor may recommend that you: ? Drink enough fluid to keep your pee (urine) clear or pale yellow. ? Take over-the-counter or prescription medicines. ? Eat foods that are high in fiber, such as fresh fruits and vegetables, whole grains, and beans. ? Limit foods that are high in fat and processed sugars, such as fried and sweet foods. Caring for your cut  Follow instructions from your doctor about how to take care of your cuts from surgery (incisions). Make sure you: ? Wash your hands with soap and water before you change your bandage (dressing). If you cannot use soap and water, use hand sanitizer. ? Change your bandage as told by your doctor. ? Leave stitches (sutures), skin glue, or skin tape (adhesive) strips in place. They may need to stay in place for 2 weeks or longer. If tape strips get loose and curl up, you may trim the loose edges. Do not remove tape strips completely unless your doctor says it is  okay.  Check your cuts every day for signs of infection. Check for: ? Redness, swelling, or more pain. ? Fluid or blood. ? Pus or a bad smell. ? Warmth.  Do not take baths, swim, or use a hot tub until your doctor says it is okay to do so. Activity   Rest at home for 1-2 days or as told by your doctor. ? Avoid sitting for a long time without moving. Get up to take short walks every 1-2 hours.  Return to your normal activities as told by your doctor. Ask what activities are safe for you.  Do not do these things in the first 24 hours: ? Drive. ? Use machinery. ? Take a bath or shower.  Do not lift more than 10 pounds (4.5 kg) or play contact sports for the first 2 weeks. General instructions   Do not drink alcohol in the first week after the procedure.  Have someone stay with you for at least 24 hours after the procedure.  Get your test results. Ask your doctor or the department that is doing the test: ? When will my results be ready? ? How will I get my results? ? What are my treatment options? ? What other tests do I need? ? What are my next steps?  Keep all follow-up visits as told by your doctor. This is important. Contact a doctor if:  A cut bleeds and leaves more than just a small spot of blood.  A cut is red, puffs up (  swells), or hurts more than before.  Fluid or something else comes from a cut.  A cut smells bad.  You have a fever or chills. Get help right away if:  You have swelling, bloating, or pain in your belly (abdomen).  You get dizzy or faint.  You have a rash.  You feel sick to your stomach (nauseous) or throw up (vomit).  You have trouble breathing, feel short of breath, or feel faint.  Your chest hurts.  You have problems talking or seeing.  You have trouble with your balance or moving your arms or legs. Summary  After the procedure, it is common to have pain, soreness, bruising, and tiredness.  Your doctor will tell you how to  take care of yourself at home. Change your bandage, take your medicines, and limit your activities as told by your doctor.  Call your doctor if you have symptoms of infection. Get help right away if your belly swells, your cut bleeds a lot, or you have trouble talking or breathing. This information is not intended to replace advice given to you by your health care provider. Make sure you discuss any questions you have with your health care provider. Document Released: 03/12/2008 Document Revised: 06/13/2017 Document Reviewed: 06/13/2017 Elsevier Patient Education  [place]. Moderate Conscious Sedation, Adult, Care After These instructions provide you with information about caring for yourself after your procedure. Your health care provider may also give you more specific instructions. Your treatment has been planned according to current medical practices, but problems sometimes occur. Call your health care provider if you have any problems or questions after your procedure. What can I expect after the procedure? After your procedure, it is common:  To feel sleepy for several hours.  To feel clumsy and have poor balance for several hours.  To have poor judgment for several hours.  To vomit if you eat too soon. Follow these instructions at home: For at least 24 hours after the procedure:   Do not: ? Participate in activities where you could fall or become injured. ? Drive. ? Use heavy machinery. ? Drink alcohol. ? Take sleeping pills or medicines that cause drowsiness. ? Make important decisions or sign legal documents. ? Take care of children on your own.  Rest. Eating and drinking  Follow the diet recommended by your health care provider.  If you vomit: ? Drink water, juice, or soup when you can drink without vomiting. ? Make sure you have little or no nausea before eating solid foods. General instructions  Have a responsible adult stay with you until you are awake  and alert.  Take over-the-counter and prescription medicines only as told by your health care provider.  If you smoke, do not smoke without supervision.  Keep all follow-up visits as told by your health care provider. This is important. Contact a health care provider if:  You keep feeling nauseous or you keep vomiting.  You feel light-headed.  You develop a rash.  You have a fever. Get help right away if:  You have trouble breathing. This information is not intended to replace advice given to you by your health care provider. Make sure you discuss any questions you have with your health care provider. Document Released: 03/24/2013 Document Revised: 05/16/2017 Document Reviewed: 09/23/2015 Elsevier Patient Education  2020 [employer].

## 2019-03-15 NOTE — Consult Note (Signed)
Chief Complaint: Patient was seen in consultation today for image guided random core liver biopsy  Referring Physician(s): Beavers,Kimberly  Supervising Physician: Daryll Brod  Patient Status: [facility] - Out-pt  History of Present Illness: [name] [name] is a 58 y.o. female with history of ulcerative colitis, HLD, elevated liver function test, GERD, positive ANA as well as AMA who presents today for image guided random core liver biopsy to rule out autoimmune hepatitis.  Past Medical History:  Diagnosis Date  . Arthritis   . Cavities 02/02/2015  . GERD (gastroesophageal reflux disease) Dx 2014  . GI bleed   . HIV (human immunodeficiency virus infection) ([place]) Dx 2015  . Hypertension Dx 2014  . Primary biliary cirrhosis ([place]) 07/03/2018  . Substance abuse ([place])    Quit 2008  . Ulcerative colitis [facility])     Past Surgical History:  Procedure Laterality Date  . ABDOMINAL HYSTERECTOMY  2001    done in [facility], patient unsure of indication  . TEE WITHOUT CARDIOVERSION N/A 05/02/2014   Procedure: TRANSESOPHAGEAL ECHOCARDIOGRAM (TEE);  Surgeon: Larey Dresser, MD;  Location: [place];  Service: Cardiovascular;  Laterality: N/A;  . VIDEO BRONCHOSCOPY Bilateral 04/27/2014   Procedure: VIDEO BRONCHOSCOPY WITH FLUORO;  Surgeon: Rigoberto Noel, MD;  Location: [facility] ENDOS[facility];  Service: Cardiopulmonary;  Laterality: Bilateral;    Allergies: Patient has no known allergies.  Medications: Prior to Admission medications   Medication Sig Start Date End Date Taking? Authorizing Provider  amLODipine (NORVASC) 10 MG tablet TAKE 1 TABLET (10 MG) BY MOUTH DAILY 10/23/18  Yes Ladell Pier, MD  atorvastatin (LIPITOR) 10 MG tablet Take 1 tablet (10 mg total) by mouth daily. 05/04/18  Yes Ladell Pier, MD  bictegravir-emtricitabine-tenofovir AF (BIKTARVY) 50-200-25 MG TABS tablet Take 1 tablet by mouth daily. 07/03/18  Yes Tommy Medal, Lavell Islam, MD  calcium citrate-vitamin D  500-400 MG-UNIT chewable tablet Chew 1 tablet by mouth 2 (two) times daily. 08/24/14  Yes Funches, Josalyn, MD  feeding supplement, ENSURE COMPLETE, (ENSURE COMPLETE) LIQD Take 237 mLs by mouth 3 (three) times daily between meals. 05/03/14  Yes Robbie Lis, MD  FEROSUL 325 (65 Fe) MG tablet TAKE 1 TABLET BY MOUTH DAILY WITH BREAKFAST. 05/28/17  Yes Ladell Pier, MD  hyoscyamine (LEVSIN, ANASPAZ) 0.125 MG tablet Take 1 tablet (0.125 mg total) by mouth every 6 (six) hours as needed. 06/26/17  Yes Ladell Pier, MD  lisinopril (ZESTRIL) 5 MG tablet TAKE 1 TABLET BY MOUTH DAILY. 03/01/19  Yes Ladell Pier, MD  Na Sulfate-K Sulfate-Mg Sulf 17.5-3.13-1.6 GM/177ML SOLN Take 1 kit by mouth as directed. 03/01/19 03/31/19 Yes Thornton Park, MD  pantoprazole (PROTONIX) 40 MG tablet Take 1 tablet (40 mg total) by mouth daily. 03/01/19  Yes Thornton Park, MD  sulfaSALAzine (AZULFIDINE) 500 MG tablet Take 2 tablets (1,000 mg total) by mouth 2 (two) times daily. 05/04/18  Yes Ladell Pier, MD     Family History  Problem Relation Age of Onset  . Cancer Father        ?  Marland Kitchen Alcoholism Father   . Diabetes Mother   . Stroke Mother   . Pancreatitis Sister   . Diabetes Sister   . Cancer Paternal Uncle 82       colon     Social History   Socioeconomic History  . Marital status: Single    Spouse name: Not on file  . Number of children: Not on file  . Years  of education: Not on file  . Highest education level: Not on file  Occupational History  . Not on file  Social Needs  . Financial resource strain: Not on file  . Food insecurity    Worry: Not on file    Inability: Not on file  . Transportation needs    Medical: Not on file    Non-medical: Not on file  Tobacco Use  . Smoking status: Former Smoker    Packs/day: 0.20    Years: 35.00    Pack years: 7.00    Types: Cigarettes  . Smokeless tobacco: Never Used  . Tobacco comment: Smoking 2-3 cigs per day  Substance and  Sexual Activity  . Alcohol use: No    Alcohol/week: 0.0 standard drinks  . Drug use: No    Types: Cocaine    Comment: no drugs per patient since 2009  . Sexual activity: Not Currently    Birth control/protection: Other-see comments, Surgical    Comment: pt. given condoms  Lifestyle  . Physical activity    Days per week: Not on file    Minutes per session: Not on file  . Stress: Not on file  Relationships  . Social Herbalist on phone: Not on file    Gets together: Not on file    Attends religious service: Not on file    Active member of club or organization: Not on file    Attends meetings of clubs or organizations: Not on file    Relationship status: Not on file  Other Topics Concern  . Not on file  Social History Narrative  . Not on file      Review of Systems currently denies fever, headache, chest pain, dyspnea, cough, abdominal/back pain, nausea, vomiting or bleeding.  Vital Signs: BP (!) 163/83 (BP Location: Right Arm)   Pulse 63   Temp 98.6 F (37 C) (Oral)   Resp 18   SpO2 100%   Physical Exam awake, alert.  Chest clear to auscultation bilaterally.  Heart with regular rate and rhythm.  Abdomen soft, positive bowel sounds, nontender.  No lower extremity edema.  Imaging: No results found.  Labs:  CBC: Recent Labs    05/06/18 0913 09/08/18 1145 03/01/19 1005 03/15/19 1112  WBC 4.2 4.5 5.4 5.2  HGB 10.7* 11.3* 13.4 12.5  HCT 31.3* 33.2* 39.1 37.1  PLT 334 341 306.0 293    COAGS: Recent Labs    03/01/19 1005 03/15/19 1112  INR 1.0 0.9    BMP: Recent Labs    05/06/18 0913 03/01/19 1005  NA 141 140  K 4.0 3.8  CL 105 103  CO2 28 28  GLUCOSE 112* 88  BUN 13 23  CALCIUM 9.7 10.6*  CREATININE 0.83 0.89  GFRNONAA 77  --   GFRAA 89  --     LIVER FUNCTION TESTS: Recent Labs    05/06/18 0913 03/01/19 1005  BILITOT 0.6 0.5  AST 62* 51*  ALT 48* 41*  ALKPHOS  --  532*  PROT 7.7 8.6*  ALBUMIN  --  4.6    TUMOR MARKERS:  Recent Labs    03/01/19 1005  AFPTM 2.6    Assessment and Plan: 58 y.o. female with history of ulcerative colitis, HLD, elevated liver function test, GERD, positive ANA as well as AMA who presents today for image guided random core liver biopsy to rule out autoimmune hepatitis.Risks and benefits of procedure was discussed with the patient  including, but not  limited to bleeding, infection, damage to adjacent structures or low yield requiring additional tests.  All of the questions were answered and there is agreement to proceed.  Consent signed and in chart.     Thank you for this interesting consult.  I greatly enjoyed meeting Aideen Fenster and look forward to participating in their care.  A copy of this report was sent to the requesting provider on this date.  Electronically Signed: D. Rowe Robert, PA-C 03/15/2019, 12:11 PM   I spent a total of 25 minutes in face to face in clinical consultation, greater than 50% of which was counseling/coordinating care for image guided random core liver biopsy

## 2019-03-15 NOTE — Procedures (Signed)
inc'd LFTs  S/p US liver core bx  No comp Stable ebl min Path pending Full report in pacs

## 2019-03-16 ENCOUNTER — Other Ambulatory Visit: Payer: Self-pay | Admitting: Gastroenterology

## 2019-03-17 ENCOUNTER — Ambulatory Visit: Payer: Medicare Other | Admitting: Infectious Disease

## 2019-03-17 ENCOUNTER — Other Ambulatory Visit: Payer: Self-pay

## 2019-03-17 ENCOUNTER — Other Ambulatory Visit: Payer: Self-pay | Admitting: Gastroenterology

## 2019-03-17 ENCOUNTER — Ambulatory Visit (HOSPITAL_COMMUNITY)
Admission: RE | Admit: 2019-03-17 | Discharge: 2019-03-17 | Disposition: A | Discharge status: Home / Self Care | Payer: Medicare Other | Source: Ambulatory Visit | Attending: Gastroenterology | Admitting: Gastroenterology

## 2019-03-17 DIAGNOSIS — R945 Abnormal results of liver function studies: Secondary | ICD-10-CM | POA: Insufficient documentation

## 2019-03-17 DIAGNOSIS — K743 Primary biliary cirrhosis: Secondary | ICD-10-CM | POA: Diagnosis not present

## 2019-03-17 DIAGNOSIS — K7689 Other specified diseases of liver: Secondary | ICD-10-CM | POA: Diagnosis not present

## 2019-03-17 DIAGNOSIS — R140 Abdominal distension (gaseous): Secondary | ICD-10-CM | POA: Diagnosis not present

## 2019-03-17 MED ORDER — GADOBUTROL 1 MMOL/ML IV SOLN
6.0000 mL | Freq: Once | INTRAVENOUS | Status: AC | PRN
  Administered 2019-03-17: 10:00:00 5 mL via INTRAVENOUS

## 2019-03-19 ENCOUNTER — Other Ambulatory Visit: Payer: Self-pay

## 2019-03-19 ENCOUNTER — Encounter: Payer: Self-pay | Admitting: Obstetrics and Gynecology

## 2019-03-19 ENCOUNTER — Ambulatory Visit (INDEPENDENT_AMBULATORY_CARE_PROVIDER_SITE_OTHER): Payer: Medicare Other | Admitting: Obstetrics and Gynecology

## 2019-03-19 VITALS — BP 143/79 | HR 73 | Wt 124.2 lb

## 2019-03-19 DIAGNOSIS — R87622 Low grade squamous intraepithelial lesion on cytologic smear of vagina (LGSIL): Secondary | ICD-10-CM

## 2019-03-19 NOTE — Progress Notes (Signed)
Ms Radcliffe presents to discuss results of last pap smear and to formulate a plan.  Pt with several medical problems as documented in her medical chart. Of significant HIV +, stable.  Pt had a TAH in her forty's ? Fibroids  Pt does not remember if sh had any abnormal pap smears prior to her hyst. Denies STD's. No sexual active.  Pap 2016 ASCUS, no colpo performed  No pap 2017 Pap 2018 LGSIL HPV +, colpo normal, no BX Pap 2020 LGSIL HPV +  Recent pap smear results reviewed with pt.  PE AF VSS Lungs clear Heart RRR Abd soft + BS  A/P Abnormal vaginal smear, LGSIL, HPV +  Abnormal vaginal smears and HPV reviewed with pt. Will schedule pt for colpo and Bx. Pt verbalized understanding and agrees to [place]

## 2019-03-19 NOTE — Patient Instructions (Signed)
Colposcopy, Care After This sheet gives you information about how to care for yourself after your procedure. Your doctor may also give you more specific instructions. If you have problems or questions, contact your doctor. What can I expect after the procedure? If you did not have a tissue sample removed (did not have a biopsy), you may only have some spotting for a few days. You can go back to your normal activities. If you had a tissue sample removed, it is common to have:  Soreness and pain. This may last for a few days.  Light-headedness.  Mild bleeding from your vagina or dark-colored, grainy discharge from your vagina. This may last for a few days. You may need to wear a sanitary pad.  Spotting for at least 48 hours after the procedure. Follow these instructions at home:   Take over-the-counter and prescription medicines only as told by your doctor. Ask your doctor what medicines you can start taking again. This is very important if you take blood-thinning medicine.  Do not drive or use heavy machinery while taking prescription pain medicine.  For 3 days, or as long as your doctor tells you, avoid: ? Douching. ? Using tampons. ? Having sex.  If you use birth control (contraception), keep using it.  Limit activity for the first day after the procedure. Ask your doctor what activities are safe for you.  It is up to you to get the results of your procedure. Ask your doctor when your results will be ready.  Keep all follow-up visits as told by your doctor. This is important. Contact a doctor if:  You get a skin rash. Get help right away if:  You are bleeding a lot from your vagina. It is a lot of bleeding if you are using more than one pad an hour for 2 hours in a row.  You have clumps of blood (blood clots) coming from your vagina.  You have a fever.  You have chills  You have pain in your lower belly (pelvic area).  You have signs of infection, such as vaginal  discharge that is: ? Different than usual. ? Yellow. ? Bad-smelling.  You have very pain or cramps in your lower belly that do not get better with medicine.  You feel light-headed.  You feel dizzy.  You pass out (faint). Summary  If you did not have a tissue sample removed (did not have a biopsy), you may only have some spotting for a few days. You can go back to your normal activities.  If you had a tissue sample removed, it is common to have mild pain and spotting for 48 hours.  For 3 days, or as long as your doctor tells you, avoid douching, using tampons and having sex.  Get help right away if you have bleeding, very bad pain, or signs of infection. This information is not intended to replace advice given to you by your health care provider. Make sure you discuss any questions you have with your health care provider. Document Released: 11/20/2007 Document Revised: 05/16/2017 Document Reviewed: 02/21/2016 Elsevier Patient Education  2020 [employer].

## 2019-03-22 ENCOUNTER — Ambulatory Visit: Payer: Medicare Other | Attending: Internal Medicine | Admitting: Internal Medicine

## 2019-03-22 ENCOUNTER — Encounter: Payer: Self-pay | Admitting: Internal Medicine

## 2019-03-22 ENCOUNTER — Other Ambulatory Visit: Payer: Self-pay

## 2019-03-22 DIAGNOSIS — B20 Human immunodeficiency virus [HIV] disease: Secondary | ICD-10-CM

## 2019-03-22 DIAGNOSIS — I10 Essential (primary) hypertension: Secondary | ICD-10-CM | POA: Diagnosis not present

## 2019-03-22 DIAGNOSIS — Z1211 Encounter for screening for malignant neoplasm of colon: Secondary | ICD-10-CM

## 2019-03-22 DIAGNOSIS — E785 Hyperlipidemia, unspecified: Secondary | ICD-10-CM

## 2019-03-22 DIAGNOSIS — K743 Primary biliary cirrhosis: Secondary | ICD-10-CM | POA: Diagnosis not present

## 2019-03-22 DIAGNOSIS — K5190 Ulcerative colitis, unspecified, without complications: Secondary | ICD-10-CM

## 2019-03-22 DIAGNOSIS — R87612 Low grade squamous intraepithelial lesion on cytologic smear of cervix (LGSIL): Secondary | ICD-10-CM | POA: Diagnosis not present

## 2019-03-22 NOTE — Progress Notes (Signed)
Patient verified DOB Patient has taken medication today. Patient has eaten today. Patient denies pain at this time. 

## 2019-03-22 NOTE — Progress Notes (Signed)
Virtual Visit via Telephone Note Due to current restrictions/limitations of in-office visits due to the COVID-19 pandemic, this scheduled clinical appointment was converted to a telehealth visit Patient was actually scheduled for in person visit but forgot so she was agreeable to change it to a telephone visit.  I connected with [name] [name] on 03/22/19 at 12:37 p.m by telephone and verified that I am speaking with the correct person using two identifiers. I am in my office.  The patient is at home.  Only the patient and myself participated in this encounter.  I discussed the limitations, risks, security and privacy concerns of performing an evaluation and management service by telephone and the availability of in person appointments. I also discussed with the patient that there may be a patient responsible charge related to this service. The patient expressed understanding and agreed to proceed.   History of Present Illness: Patient with history of HTN, HL, IDA, GERD,LGSILwith positive HPV(neg colpo 07/10/2017),formertobacco, HIV, ulcerative colitis and calf cramps   Since last visit with me she had seen the gynecologist Dr. Rip Harbour for evaluation of abnormal Pap smear.  Her Pap smear came back as LSIL HPV positive.  He plans to do colposcopy.  UC/PBC: She is seen the gastroenterologist Dr. Tarri Glenn.  Liver biopsy revealed findings consistent with PBC with features overlapping with autoimmune hepatitis.  She has a follow-up appointment coming up with Dr. Tarri Glenn for further discussion on diagnosis.  She also has colonoscopy scheduled.    On recent labs she was noted to have an elevated calcium level of 10.6.  Patient states that she is not on vitamin D or calcium supplement.  HTN: Reports compliance with amlodipine and lisinopril.  She does have a device to check blood pressure but has not been doing so.  She tries to limit salt in the foods.  Denies any chest pains or shortness of breath.  No  lower extremity edema.  HL: Reports compliance with Lipitor.  Denies any muscle cramps from the medication.   HIV: Has follow-up appointment with Dr. Drucilla Schmidt later this month.  Reports compliance with Biktarvy.  HM: MMG schedule 03/25/2019, c-scope schedule 03/31/2019.  She is due for flu shot.  She is willing to come in as a nurse only visit to have it done.  Outpatient Encounter Medications as of 03/22/2019  Medication Sig  . amLODipine (NORVASC) 10 MG tablet TAKE 1 TABLET (10 MG) BY MOUTH DAILY  . atorvastatin (LIPITOR) 10 MG tablet Take 1 tablet (10 mg total) by mouth daily.  . bictegravir-emtricitabine-tenofovir AF (BIKTARVY) 50-200-25 MG TABS tablet Take 1 tablet by mouth daily.  . feeding supplement, ENSURE COMPLETE, (ENSURE COMPLETE) LIQD Take 237 mLs by mouth 3 (three) times daily between meals.  . FEROSUL 325 (65 Fe) MG tablet TAKE 1 TABLET BY MOUTH DAILY WITH BREAKFAST.  . hyoscyamine (LEVSIN, ANASPAZ) 0.125 MG tablet Take 1 tablet (0.125 mg total) by mouth every 6 (six) hours as needed.  Marland Kitchen lisinopril (ZESTRIL) 5 MG tablet TAKE 1 TABLET BY MOUTH DAILY.  . Na Sulfate-K Sulfate-Mg Sulf 17.5-3.13-1.6 GM/177ML SOLN Take 1 kit by mouth as directed.  . pantoprazole (PROTONIX) 40 MG tablet Take 1 tablet (40 mg total) by mouth daily.  Marland Kitchen sulfaSALAzine (AZULFIDINE) 500 MG tablet Take 2 tablets (1,000 mg total) by mouth 2 (two) times daily.  . [DISCONTINUED] calcium citrate-vitamin D 500-400 MG-UNIT chewable tablet Chew 1 tablet by mouth 2 (two) times daily.   No facility-administered encounter medications on file as of 03/22/2019.  Observations/Objective: No direct observation done as this was a telephone encounter  Assessment and Plan: 1. Essential hypertension Continue Norvasc and lisinopril.  Encourage patient to check blood pressure at least once a week with goal being 130/80 or lower.  2. Hyperlipidemia, unspecified hyperlipidemia type Continue atorvastatin  3. Primary biliary  cirrhosis ([place]) 4. Ulcerative colitis without complications, unspecified location [facility]) Patient to keep follow-up appointment with Dr. Tarri Glenn. Patient has colonoscopy scheduled for later this month  5. Low grade squamous intraepithelial lesion on cytologic smear of cervix (LGSIL) Gynecology plans to do colposcopy  6. Screening for colon cancer Already scheduled per patient  7. HIV disease ([place]) Followed by ID.  Compliant with medication  8. Hypercalcemia Patient to come to the lab to have blood test done for further evaluation - Intact PTH (Includes Calcium); Future - TSH; Future - Vitamin D, 25-hydroxy; Future  9.  Need for influenza vaccine Patient will come as a nurse only visit on Thursday after her mammogram to have the flu shot Follow Up Instructions: 3 months   I discussed the assessment and treatment plan with the patient. The patient was provided an opportunity to ask questions and all were answered. The patient agreed with the plan and demonstrated an understanding of the instructions.   The patient was advised to call back or seek an in-person evaluation if the symptoms worsen or if the condition fails to improve as anticipated.  I provided 8 minutes of non-face-to-face time during this encounter.   Karle Plumber, MD

## 2019-03-23 LAB — SURGICAL PATHOLOGY

## 2019-03-25 ENCOUNTER — Ambulatory Visit: Payer: Medicare Other | Attending: Internal Medicine | Admitting: Pharmacist

## 2019-03-25 ENCOUNTER — Other Ambulatory Visit: Payer: Self-pay

## 2019-03-25 ENCOUNTER — Ambulatory Visit
Admission: RE | Admit: 2019-03-25 | Discharge: 2019-03-25 | Disposition: A | Discharge status: Home / Self Care | Payer: Medicare Other | Source: Ambulatory Visit | Attending: Internal Medicine | Admitting: Internal Medicine

## 2019-03-25 DIAGNOSIS — Z23 Encounter for immunization: Secondary | ICD-10-CM

## 2019-03-25 DIAGNOSIS — Z1239 Encounter for other screening for malignant neoplasm of breast: Secondary | ICD-10-CM

## 2019-03-25 DIAGNOSIS — Z1231 Encounter for screening mammogram for malignant neoplasm of breast: Secondary | ICD-10-CM | POA: Diagnosis not present

## 2019-03-25 NOTE — Progress Notes (Signed)
Patient presents for vaccination against influenza per orders of Dr. Wynetta Emery. Consent given. Counseling provided. No contraindications exists. Vaccine administered without incident.

## 2019-03-29 MED FILL — PANTOPRAZOLE SOD DR 40 MG T: 40 | 30 days supply | Qty: 30 | Fill #1

## 2019-03-29 MED FILL — BIKTARVY 50-200-25 MG TABS: 50-200-25 | 30 days supply | Qty: 30 | Fill #7

## 2019-03-30 ENCOUNTER — Telehealth: Payer: Self-pay

## 2019-03-30 NOTE — Telephone Encounter (Signed)
Covid-19 screening questions   Do you now or have you had a fever in the last 14 days?  Do you have any respiratory symptoms of shortness of breath or cough now or in the last 14 days?  Do you have any family members or close contacts with diagnosed or suspected Covid-19 in the past 14 days?  Have you been tested for Covid-19 and found to be positive?       

## 2019-03-30 NOTE — Telephone Encounter (Signed)
Patient called back and answered "NO" to all screening questions.

## 2019-03-31 ENCOUNTER — Other Ambulatory Visit: Payer: Self-pay | Admitting: Gastroenterology

## 2019-03-31 ENCOUNTER — Ambulatory Visit (AMBULATORY_SURGERY_CENTER): Payer: Medicare Other | Admitting: Gastroenterology

## 2019-03-31 ENCOUNTER — Other Ambulatory Visit: Payer: Self-pay

## 2019-03-31 ENCOUNTER — Encounter: Payer: Self-pay | Admitting: Gastroenterology

## 2019-03-31 VITALS — BP 146/77 | HR 76 | Temp 98.6°F | Resp 18 | Ht 67.0 in | Wt 122.0 lb

## 2019-03-31 DIAGNOSIS — K259 Gastric ulcer, unspecified as acute or chronic, without hemorrhage or perforation: Secondary | ICD-10-CM

## 2019-03-31 DIAGNOSIS — K3189 Other diseases of stomach and duodenum: Secondary | ICD-10-CM | POA: Diagnosis not present

## 2019-03-31 DIAGNOSIS — K648 Other hemorrhoids: Secondary | ICD-10-CM | POA: Diagnosis not present

## 2019-03-31 DIAGNOSIS — K219 Gastro-esophageal reflux disease without esophagitis: Secondary | ICD-10-CM | POA: Diagnosis not present

## 2019-03-31 DIAGNOSIS — K7460 Unspecified cirrhosis of liver: Secondary | ICD-10-CM | POA: Diagnosis not present

## 2019-03-31 DIAGNOSIS — K2950 Unspecified chronic gastritis without bleeding: Secondary | ICD-10-CM | POA: Diagnosis not present

## 2019-03-31 DIAGNOSIS — K2100 Gastro-esophageal reflux disease with esophagitis, without bleeding: Secondary | ICD-10-CM | POA: Diagnosis not present

## 2019-03-31 DIAGNOSIS — K5100 Ulcerative (chronic) pancolitis without complications: Secondary | ICD-10-CM

## 2019-03-31 MED ORDER — SODIUM CHLORIDE 0.9 % IV SOLN: 500.0000 mL | Freq: Once | INTRAVENOUS | Status: DC

## 2019-03-31 NOTE — Progress Notes (Signed)
Report to PACU, RN, vss, BBS= Clear.  

## 2019-03-31 NOTE — Progress Notes (Signed)
Called to room to assist during endoscopic procedure.  Patient ID and intended procedure confirmed with present staff. Received instructions for my participation in the procedure from the performing physician.  

## 2019-03-31 NOTE — Op Note (Signed)
[facility] Patient Name: [name] [name] Procedure Date: 03/31/2019 10:29 AM MRN: [id number] Endoscopist: Thornton Park MD, MD Age: 58 Referring MD:  Date of Birth: December 21, 1960 Gender: Female Account #: [id number] Procedure:                Upper GI endoscopy Indications:              Abdominal bloating                           Postprandial bloating and distension                           GERD Medicines:                See the Anesthesia note for documentation of the                            administered medications Procedure:                Pre-Anesthesia Assessment:                           - Prior to the procedure, a History and Physical                            was performed, and patient medications and                            allergies were reviewed. The patient's tolerance of                            previous anesthesia was also reviewed. The risks                            and benefits of the procedure and the sedation                            options and risks were discussed with the patient.                            All questions were answered, and informed consent                            was obtained. Prior Anticoagulants: The patient has                            taken no previous anticoagulant or antiplatelet                            agents. ASA Grade Assessment: III - A patient with                            severe systemic disease. After reviewing the risks  and benefits, the patient was deemed in                            satisfactory condition to undergo the procedure.                           After obtaining informed consent, the endoscope was                            passed under direct vision. Throughout the                            procedure, the patient's blood pressure, pulse, and                            oxygen saturations were monitored continuously. The                            Endoscope was  introduced through the mouth, and                            advanced to the third part of duodenum. The upper                            GI endoscopy was accomplished without difficulty.                            The patient tolerated the procedure well. Scope In: Scope Out: Findings:                 Sublte ringed-appearance to the esophagus was found                            in the middle third of the esophagus. The z-line                            was irregular. Biopsies were obtained from the                            proximal and distal esophagus with cold forceps for                            histology of suspected eosinophilic esophagitis.                            Estimated blood loss was minimal.                           One non-bleeding cratered gastric ulcer with                            pigmented material was found in the gastric antrum.  The lesion was 12 mm in largest dimension. Biopsies                            were taken from the antrum, body, and fundus with a                            cold forceps for histology. Estimated blood loss                            was minimal.                           A small hiatal hernia is present. The examined                            duodenum was normal. Biopsies were taken with a                            cold forceps for histology. Estimated blood loss                            was minimal.                           The exam was otherwise without abnormality. Complications:            No immediate complications. Estimated blood loss:                            Minimal. Estimated Blood Loss:     Estimated blood loss was minimal. Impression:               - Esophageal mucosal changes suspicious for                            eosinophilic esophagitis. Biopsied.                           - Non-bleeding gastric ulcer with pigmented                            material. Biopsied.                            - Normal examined duodenum. Biopsied.                           - The examination was otherwise normal. Recommendation:           - Patient has a contact number available for                            emergencies. The signs and symptoms of potential                            delayed complications were discussed with the  patient. Return to normal activities tomorrow.                            Written discharge instructions were provided to the                            patient.                           - Resume previous diet today.                           - Continue present medications. Increase                            pantoprazole to 40 mg BID x 8 weeks.                           - Await pathology results.                           - Avoid all NSAIDs.                           - Repeat EGD in 8-10 weeks to reassess the gastric                            ulcer.                           - Follow-up in the office as previously planned. Thornton Park MD, MD 03/31/2019 16:01:09 AM This report has been signed electronically.

## 2019-03-31 NOTE — Patient Instructions (Signed)
Please read handouts provided. Await pathology results. Avoid all NSAIDS. Repeat EGD in 8-10 weeks. Continue present medications. Increase pantoprazole to 40 mg twice daily for 8 weeks. Follow-up in GI office as planned.       YOU HAD AN ENDOSCOPIC PROCEDURE TODAY AT [facility]:   Refer to the procedure report that was given to you for any specific questions about what was found during the examination.  If the procedure report does not answer your questions, please call your gastroenterologist to clarify.  If you requested that your care partner not be given the details of your procedure findings, then the procedure report has been included in a sealed envelope for you to review at your convenience later.  YOU SHOULD EXPECT: Some feelings of bloating in the abdomen. Passage of more gas than usual.  Walking can help get rid of the air that was put into your GI tract during the procedure and reduce the bloating. If you had a lower endoscopy (such as a colonoscopy or flexible sigmoidoscopy) you may notice spotting of blood in your stool or on the toilet paper. If you underwent a bowel prep for your procedure, you may not have a normal bowel movement for a few days.  Please Note:  You might notice some irritation and congestion in your nose or some drainage.  This is from the oxygen used during your procedure.  There is no need for concern and it should clear up in a day or so.  SYMPTOMS TO REPORT IMMEDIATELY:   Following lower endoscopy (colonoscopy or flexible sigmoidoscopy):  Excessive amounts of blood in the stool  Significant tenderness or worsening of abdominal pains  Swelling of the abdomen that is new, acute  Fever of 100F or higher   Following upper endoscopy (EGD)  Vomiting of blood or coffee ground material  New chest pain or pain under the shoulder blades  Painful or persistently difficult swallowing  New shortness of breath  Fever of 100F or  higher  Black, tarry-looking stools  For urgent or emergent issues, a gastroenterologist can be reached at any hour by calling [phone].   DIET:  We do recommend a small meal at first, but then you may proceed to your regular diet.  Drink plenty of fluids but you should avoid alcoholic beverages for 24 hours.  ACTIVITY:  You should plan to take it easy for the rest of today and you should NOT DRIVE or use heavy machinery until tomorrow (because of the sedation medicines used during the test).    FOLLOW UP: Our staff will call the number listed on your records 48-72 hours following your procedure to check on you and address any questions or concerns that you may have regarding the information given to you following your procedure. If we do not reach you, we will leave a message.  We will attempt to reach you two times.  During this call, we will ask if you have developed any symptoms of COVID 19. If you develop any symptoms (ie: fever, flu-like symptoms, shortness of breath, cough etc.) before then, please call [phone].  If you test positive for Covid 19 in the 2 weeks post procedure, please call and report this information to [place].    If any biopsies were taken you will be contacted by phone or by letter within the next 1-3 weeks.  Please call us at [phone] if you have not heard about the biopsies in 3 weeks.  SIGNATURES/CONFIDENTIALITY: You and/or your care partner have signed paperwork which will be entered into your electronic medical record.  These signatures attest to the fact that that the information above on your After Visit Summary has been reviewed and is understood.  Full responsibility of the confidentiality of this discharge information lies with you and/or your care-partner.

## 2019-03-31 NOTE — Progress Notes (Signed)
Temp check by KA/vital check by CW.  Medical and surgical history reviewed and verified with the patient.

## 2019-03-31 NOTE — Op Note (Signed)
[facility] Patient Name: [name] [name] Procedure Date: 03/31/2019 10:29 AM MRN: [id number] Endoscopist: Thornton Park MD, MD Age: 58 Referring MD:  Date of Birth: 06-07-1961 Gender: Female Account #: [id number] Procedure:                Colonoscopy Indications:              Ulcerative colitis                           - Abnormal CT scan 04/2014: thickened left colon                           - diagnosed with UC while hospitalized in [facility] 05/2014                           Colonoscopy recommended for staging. Medicines:                See the Anesthesia note for documentation of the                            administered medications Procedure:                Pre-Anesthesia Assessment:                           - Prior to the procedure, a History and Physical                            was performed, and patient medications and                            allergies were reviewed. The patient's tolerance of                            previous anesthesia was also reviewed. The risks                            and benefits of the procedure and the sedation                            options and risks were discussed with the patient.                            All questions were answered, and informed consent                            was obtained. Prior Anticoagulants: The patient has                            taken no previous anticoagulant or antiplatelet                            agents. ASA  Grade Assessment: III - A patient with                            severe systemic disease. After reviewing the risks                            and benefits, the patient was deemed in                            satisfactory condition to undergo the procedure.                           After obtaining informed consent, the colonoscope                            was passed under direct vision. Throughout the                            procedure, the  patient's blood pressure, pulse, and                            oxygen saturations were monitored continuously. The                            Colonoscope was introduced through the anus and                            advanced to the the terminal ileum, with                            identification of the appendiceal orifice and IC                            valve. A second forward view of the right colon was                            performed. The colonoscopy was performed with                            difficulty due to a redundant colon, significant                            looping and a tortuous colon. Successful completion                            of the procedure was aided by applying abdominal                            pressure. The patient tolerated the procedure well.                            The quality of the bowel preparation was adequate.  The terminal ileum, ileocecal valve, appendiceal                            orifice, and rectum were photographed. Scope In: 10:44:38 AM Scope Out: 10:58:34 AM Scope Withdrawal Time: 0 hours 10 minutes 9 seconds  Total Procedure Duration: 0 hours 13 minutes 56 seconds  Findings:                 Hemorrhoids were found on perianal exam.                           Scattered pseudopolyps were found in the entire                            colon. The mucosa otherwise appeared normal without                            evidence for colitis. Biopsies were taken from the                            right colon, transverse, left colon, and rectum                            with a cold forceps for histology. Estimated blood                            loss was minimal.                           The terminal ileum appeared normal. Biopsies were                            taken with a cold forceps for histology. Estimated                            blood loss was minimal.                           A moderate amount of  stool was found in the entire                            colon, interfering with visualization.                           The exam was otherwise without abnormality on                            direct and retroflexion views. Complications:            No immediate complications. Estimated blood loss:                            Minimal. Estimated Blood Loss:     Estimated blood loss was minimal. Impression:               - Hemorrhoids found on perianal exam.                           -  Pseudopolyps in the entire examined colon.                            Biopsied.                           - The examined portion of the ileum was normal.                            Biopsied.                           - Stool in the entire examined colon. This examine                            was not of a high enough quality to replace a                            screening colonoscopy.                           - The examination was otherwise normal on direct                            and retroflexion views. Recommendation:           - Patient has a contact number available for                            emergencies. The signs and symptoms of potential                            delayed complications were discussed with the                            patient. Return to normal activities tomorrow.                            Written discharge instructions were provided to the                            patient.                           - Resume previous diet today.                           - Continue present medications.                           - Await pathology results.                           - Consider short interval to repeat the colonoscopy                            with a 2 day prep  after reviewing the pathology                            results. Thornton Park MD, MD 03/31/2019 11:11:40 AM This report has been signed electronically.

## 2019-04-02 ENCOUNTER — Telehealth: Payer: Self-pay

## 2019-04-02 NOTE — Telephone Encounter (Signed)
Follow up call attempted.  NALM

## 2019-04-02 NOTE — Telephone Encounter (Signed)
  Follow up Call-  Call back number 03/31/2019  Post procedure Call Back phone  # [phone]  Permission to leave phone message Yes  Some recent data might be hidden     Patient questions:  Do you have a fever, pain , or abdominal swelling? No. Pain Score  0 *  Have you tolerated food without any problems? Yes.    Have you been able to return to your normal activities? Yes.    Do you have any questions about your discharge instructions: Diet   No. Medications  No. Follow up visit  No.  Do you have questions or concerns about your Care? No.  Actions: * If pain score is 4 or above: No action needed, pain <4.  1. Have you developed a fever since your procedure? no  2.   Have you had an respiratory symptoms (SOB or cough) since your procedure? no  3.   Have you tested positive for COVID 19 since your procedure no  4.   Have you had any family members/close contacts diagnosed with the COVID 19 since your procedure?  no   If yes to any of these questions please route to Joylene John, RN and Alphonsa Gin, Therapist, sports.

## 2019-04-12 ENCOUNTER — Ambulatory Visit (INDEPENDENT_AMBULATORY_CARE_PROVIDER_SITE_OTHER): Payer: Medicare Other | Admitting: Infectious Disease

## 2019-04-12 ENCOUNTER — Other Ambulatory Visit (HOSPITAL_COMMUNITY)
Admission: RE | Admit: 2019-04-12 | Discharge: 2019-04-12 | Disposition: A | Discharge status: Home / Self Care | Payer: Medicare Other | Source: Ambulatory Visit | Attending: Infectious Disease | Admitting: Infectious Disease

## 2019-04-12 ENCOUNTER — Encounter: Payer: Self-pay | Admitting: Infectious Disease

## 2019-04-12 ENCOUNTER — Other Ambulatory Visit: Payer: Self-pay

## 2019-04-12 VITALS — BP 145/98 | HR 82 | Temp 97.7°F | Wt 118.0 lb

## 2019-04-12 DIAGNOSIS — K743 Primary biliary cirrhosis: Secondary | ICD-10-CM

## 2019-04-12 DIAGNOSIS — I10 Essential (primary) hypertension: Secondary | ICD-10-CM | POA: Diagnosis not present

## 2019-04-12 DIAGNOSIS — R87622 Low grade squamous intraepithelial lesion on cytologic smear of vagina (LGSIL): Secondary | ICD-10-CM

## 2019-04-12 DIAGNOSIS — F331 Major depressive disorder, recurrent, moderate: Secondary | ICD-10-CM

## 2019-04-12 DIAGNOSIS — B20 Human immunodeficiency virus [HIV] disease: Secondary | ICD-10-CM | POA: Insufficient documentation

## 2019-04-12 DIAGNOSIS — Z79899 Other long term (current) drug therapy: Secondary | ICD-10-CM | POA: Diagnosis not present

## 2019-04-12 MED ORDER — BIKTARVY 50-200-25 MG PO TABS: 1.0000 | ORAL_TABLET | Freq: Every day | ORAL | 11 refills | Status: DC

## 2019-04-12 NOTE — Progress Notes (Signed)
Chief complaint: Follow-up for HIV on medications  Subjective:    Patient ID: [name] [name], female    DOB: May 12, 1961, 59 y.o.   MRN: [id number]  HPI  This is a 58 year-old African-American lady with HIV, ulcerative colitis newly diagnosed primary biliary cirrhosis, hypertension usually on [facility] to minimize drug drug interactions  She has been suffering from some depressive symptoms with loss of loved one.  She also has had multiple medical visits with GI, liver biopsy showing PBC, LGSIL on biopsy by Ob/Gyn.  She is trying to gain back some weight.    Past Medical History:  Diagnosis Date  . Arthritis   . Cavities 02/02/2015  . GERD (gastroesophageal reflux disease) Dx 2014  . GI bleed   . HIV (human immunodeficiency virus infection) ([place]) Dx 2015  . Hypertension Dx 2014  . Primary biliary cirrhosis ([place]) 07/03/2018  . Substance abuse ([place])    Quit 2008  . Ulcerative colitis [facility])     Past Surgical History:  Procedure Laterality Date  . ABDOMINAL HYSTERECTOMY  2001    done in [facility], patient unsure of indication  . TEE WITHOUT CARDIOVERSION N/A 05/02/2014   Procedure: TRANSESOPHAGEAL ECHOCARDIOGRAM (TEE);  Surgeon: Larey Dresser, MD;  Location: [facility];  Service: Cardiovascular;  Laterality: N/A;  . VIDEO BRONCHOSCOPY Bilateral 04/27/2014   Procedure: VIDEO BRONCHOSCOPY WITH FLUORO;  Surgeon: Rigoberto Noel, MD;  Location: [facility];  Service: Cardiopulmonary;  Laterality: Bilateral;    Family History  Problem Relation Age of Onset  . Cancer Father        ?  Marland Kitchen Alcoholism Father   . Diabetes Mother   . Stroke Mother   . Pancreatitis Sister   . Diabetes Sister   . Cancer Paternal Uncle 50       colon   . Colon cancer Paternal Uncle   . Prostate cancer Brother   . Esophageal cancer Neg Hx   . Rectal cancer Neg Hx   . Stomach cancer Neg Hx       Social History   Socioeconomic History  . Marital status: Single    Spouse  name: Not on file  . Number of children: Not on file  . Years of education: Not on file  . Highest education level: Not on file  Occupational History  . Not on file  Social Needs  . Financial resource strain: Not on file  . Food insecurity    Worry: Not on file    Inability: Not on file  . Transportation needs    Medical: Not on file    Non-medical: Not on file  Tobacco Use  . Smoking status: Former Smoker    Packs/day: 0.20    Years: 35.00    Pack years: 7.00    Types: Cigarettes  . Smokeless tobacco: Never Used  . Tobacco comment: Smoking 2-3 cigs per day  Substance and Sexual Activity  . Alcohol use: No    Alcohol/week: 0.0 standard drinks  . Drug use: Yes    Types: Marijuana    Comment: no drugs per patient since 2009; previous cocaine use  . Sexual activity: Not Currently    Birth control/protection: Other-see comments, Surgical    Comment: offered condoms; 03/2019  Lifestyle  . Physical activity    Days per week: Not on file    Minutes per session: Not on file  . Stress: Not on file  Relationships  . Social connections    Talks  on phone: Not on file    Gets together: Not on file    Attends religious service: Not on file    Active member of club or organization: Not on file    Attends meetings of clubs or organizations: Not on file    Relationship status: Not on file  Other Topics Concern  . Not on file  Social History Narrative  . Not on file    No Known Allergies   Current Outpatient Medications:  .  amLODipine (NORVASC) 10 MG tablet, TAKE 1 TABLET (10 MG) BY MOUTH DAILY, Disp: 90 tablet, Rfl: 0 .  atorvastatin (LIPITOR) 10 MG tablet, Take 1 tablet (10 mg total) by mouth daily., Disp: 90 tablet, Rfl: 6 .  bictegravir-emtricitabine-tenofovir AF (BIKTARVY) 50-200-25 MG TABS tablet, Take 1 tablet by mouth daily., Disp: 30 tablet, Rfl: 11 .  feeding supplement, ENSURE COMPLETE, (ENSURE COMPLETE) LIQD, Take 237 mLs by mouth 3 (three) times daily between  meals., Disp: 237 mL, Rfl: 0 .  FEROSUL 325 (65 Fe) MG tablet, TAKE 1 TABLET BY MOUTH DAILY WITH BREAKFAST., Disp: 90 tablet, Rfl: 3 .  hyoscyamine (LEVSIN, ANASPAZ) 0.125 MG tablet, Take 1 tablet (0.125 mg total) by mouth every 6 (six) hours as needed., Disp: 120 tablet, Rfl: 0 .  lisinopril (ZESTRIL) 5 MG tablet, TAKE 1 TABLET BY MOUTH DAILY., Disp: 90 tablet, Rfl: 0 .  pantoprazole (PROTONIX) 40 MG tablet, Take 1 tablet (40 mg total) by mouth daily., Disp: 30 tablet, Rfl: 3 .  sulfaSALAzine (AZULFIDINE) 500 MG tablet, Take 2 tablets (1,000 mg total) by mouth 2 (two) times daily., Disp: 120 tablet, Rfl: 6   Review of Systems  Constitutional: Positive for unexpected weight change. Negative for activity change, appetite change, chills, diaphoresis, fatigue and fever.  HENT: Negative for congestion, rhinorrhea, sinus pressure, sneezing, sore throat and trouble swallowing.   Eyes: Negative for photophobia and visual disturbance.  Respiratory: Negative for cough, shortness of breath, wheezing and stridor.   Cardiovascular: Negative for chest pain, palpitations and leg swelling.  Gastrointestinal: Negative for abdominal distention, abdominal pain, anal bleeding, blood in stool, constipation, diarrhea, nausea and vomiting.  Genitourinary: Negative for difficulty urinating, dysuria, flank pain and hematuria.  Musculoskeletal: Negative for arthralgias, back pain, gait problem, joint swelling and myalgias.  Skin: Negative for color change, pallor, rash and wound.  Neurological: Negative for dizziness, tremors, weakness and light-headedness.  Hematological: Negative for adenopathy. Does not bruise/bleed easily.  Psychiatric/Behavioral: Negative for agitation, behavioral problems, confusion, decreased concentration, dysphoric mood and sleep disturbance. The patient is not hyperactive.        Objective:   Physical Exam  Constitutional: She is oriented to person, place, and time. She appears  well-developed and well-nourished. No distress.  HENT:  Head: Normocephalic and atraumatic.  Mouth/Throat: No oropharyngeal exudate.  Eyes: EOM are normal. No scleral icterus.  Neck: Normal range of motion. Neck supple.  Cardiovascular: Normal rate and regular rhythm.  Pulmonary/Chest: Effort normal. No respiratory distress. She has no wheezes.  Abdominal: Soft. She exhibits no distension.  Musculoskeletal:        General: No tenderness or edema.  Neurological: She is alert and oriented to person, place, and time. Cranial nerve deficit: .25. She exhibits normal muscle tone. Coordination normal.  Skin: Skin is warm and dry. No rash noted. She is not diaphoretic. No erythema. No pallor.  Psychiatric: Her behavior is normal. Judgment and thought content normal. She exhibits a depressed mood.  Nursing note and vitals reviewed.  Assessment & Plan:   HIV: check labs and renew  BIKTARVY return to clinic in 6 months time.  Hypertension :  On medications and w PCP Vitals:   04/12/19 1054  BP: (!) 145/98  Pulse: 82  Temp: 97.7 F (36.5 C)   UC: followed by GI for this and PBC  LGSIL: followed closely by Ob/gyn  Depression: She feels she has sufficient support for dealing with the multiple stressors in her life and because of her good friend with whom she shares "everything".

## 2019-04-13 LAB — T-HELPER CELL (CD4) - (RCID CLINIC ONLY)
CD4 % Helper T Cell: 25 % — ABNORMAL LOW (ref 33–65)
CD4 T Cell Abs: 685 /uL (ref 400–1790)

## 2019-04-13 NOTE — Progress Notes (Signed)
Referring Provider: Ladell Pier, MD Primary Care Physician:  Ladell Pier, MD  Reason for Consultation:  UC, biliary cirrhosis   IMPRESSION:  Quiesced sent ulcerative colitis    - Abnormal CT scan 04/2014: thickened left colon    - diagnosed with UC while hospitalized in [facility] 05/2014    -Colonoscopy 03/31/2019 shows pancolonic pseudopolyps but no active colitis PBC with autoimmune overlap with abnormal liver enzymes    -  Alk phos elevated dating back to 2015      - magnitude of elevation has increased in the last couple of years.          - Transaminases are mildly elevated. Total bilirubin is normal.     -Labs 03/01/2019: ANA 1:1280, IgG 1793, AMA 215.5, IgM 121    -Liver biopsy 03/15/2019 showed 2-3 fibrosis    -Abdominal imaging has suggested cirrhosis    - FIB-4 1.56 and APRI 0.450 do not support a diagnosis of cirrhosis Postprandial bloating and distension GERD Abnormal ultrasound and liver MRI    - CT a/p with contrast at [facility] 11/13/17:        - enteritis, mild gastritis, mild hepatomegaly, liver lesions    - ultrasound 05/08/18: 1.3 cm solid mass in the left liver lobe       - 0.6 cm irregular hypoechoic mass in the right liver lobe       - 1.2 cm benign appearing cyst in the right liver lobe    - MRI 05/30/18: suspected cirrhosis       - 5 small lesion in the liver, likely small cysts    - MRI 03/17/19 HIV on BIKTARVY    PBC with autoimmune overlap: Liver biopsy is consistent with PBC with autoimmune hepatitis overlap.  There is some fibrosis but no evidence on biopsy for advanced fibrosis.  However, imaging suggest the possibility of cirrhosis.  There is no portal hypertension by upper endoscopy.  I recommended starting weight-based ursodeoxycholic acid and budesonide for close management.  I like her to have repeat labs in 6 to 8 weeks.  Ulcerative colitis: Recent colonoscopy shows no endoscopic or histopathology consistent with active  colitis.  Bloating: Etiology is unclear but may be related to active autoimmune hepatitis.  We will continue pantoprazole 40 mg twice daily.  Add FD guard.  And continue close monitoring to see if symptoms improve as her autoimmune hepatitis is better controlled.    PLAN: - UDCA at a total daily dose of 13 to 15 mg/kg for 250 mg 2 tablets BID - Budesonde 9 mg daily - Continue pantoprazole 40 mg twice daily - Trial of FDGard for symptom control - CMP, IgG one week before her next appointment - Follow-up EGD in 8-10 weeks - Return in one month  Please see the "Patient Instructions" section for addition details about the plan.  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 Lives in [place]. Previously followed by a gastroenterologist in [facility] but she cannot remember their name. 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  Her initial consultation was performed 03/01/2019. She has a history of HTN, HL, IDA, GERD,LGSILwith positive HPV(neg colpo 07/10/2017),formertobacco, HIV on BIKTARVY, cavitary lung lesion, ulcerative colitis and calf cramps. History of substance abuse but has not used any drugs for 3 years.   At the time of her consultation we agreed to proceed with studies to stage both her ulcerative colitis and PBC.  Initial labs performed at  that time suggested the possibility of PBC with autoimmune overlap.  A liver biopsy was also obtained.  Since that time she had a colon -Labs 03/01/2019: Normal CMP except for a calcium of 10.6, alk phos 532, total bilirubin 0.5, AST 51, ALT 41, albumin 4.6, IgG 1793, IgM 121, INR 1.0, ANA positive at 06-1278, ACE less than 5, AMA to 15.5 - MRI/MRCP 03/17/2019 showed cirrhosis without liver mass.  Spleen was normal.  No ascites. - Liver biopsy was performed 03/15/2019 showing PBC with features of overlapping autoimmune hepatitis.  Stage 2-3 out of 4 fibrosis. - Colonoscopy was performed 03/31/2019 given her  history of ulcerative colitis.  She had pancolonic pseudopolyps and hemorrhoids.  Stool in the entire examined colon limited this from being a high quality screening colonoscopy.  Biopsies from the terminal ileum and throughout the colon were completely normal. - Upper endoscopy 03/31/19 showed a small hiatal hernia, 12 mm cratered gastric antral ulcer.  Esophageal biopsies showed reflux without eosinophilic esophagitis or Barrett's.  She had chronic inactive gastritis without H. pylori.  Duodenal biopsies were normal.  Today her primary concern is a loss of appetite. Early satiety. Bloated within minutes of eating that lasts 30-60 minutes. She feels that she has lost at 3 pounds since the symptoms started 2 months ago.  MJ occasionally for appetite stimulation.  Occurs within 5-10 minutes of eating. She has been treating this with Mylanta and Tums with some relief.  Reduced her food intake to minimize her symptoms. No specific food triggers. No sitophobia.   Prior records from her evaluation with Dr. Deatra Ina in 2016: She has a history of drug abuse, AIDS, hypertension and a cavitary lesion of the lung.  She was told that she has colitis while recently hospitalized in [facility] although she is unsure whether she had a colonoscopy.  A CT scan in November, 2015, demonstrated thickening of the left colon.  She complains of chronic abdominal pain.  Stools are intermittently loose and she has had some limited rectal bleeding.  On May 16, 2014 hemoglobin was 8. CT scan in November, 2015 demonstrated thickening of the left colon.  C. difficile toxin was negative.  She was apparently hospitalized in [facility] last month and was told she has ulcerative colitis but she can provide no details.  Patient claims have chronic abdominal pain that is not controlled with Tylenol No. 3.  She's asking for "stronger meds". Before embarking on any workup we need to review her prior records.  She has been given pain  medications through the clinic here in [place] and I told her that I will not prescribe any further pain medications.  The patient did not follow-up. It does not appear that the records from [facility] were ever located. I am unable to find them in [place].  Brother with prostate cancer. Father with stomach cancer. Paternal uncle with cancer of unknown etiology. No known family history of colon cancer or polyps. No other family history of uterine/endometrial cancer, pancreatic cancer or gastric/stomach cancer.  Prior colonoscopy about 2 years at [facility]. However, I am unable to locate this in her records.    Labs 09/08/18: WBC 4.5, hgb 11.3, platelets 341, MCV 92.2, RDW 15.1.  Prior abdominal imaging:  Ultrasound 05/08/18:  1. There is a 1.3 cm solid appearing mass in the anterior aspect of the left lobe of the liver that has increased in size from 0.6 cm on 11/13/2017. This is concerning for a primary or metastatic  neoplasm. This could be better evaluated with pre and postcontrast MRI of the liver. 2. A previously demonstrated 2nd possibly solid mass in the periphery of the right lobe of the liver, laterally, could not be visualized today. This could also represent a primary or metastatic neoplasm. 3. A 0.6 cm irregular hypoechoic mass in the right lobe of the liver is most likely a complicated cyst. A solid neoplasm is less likely. 4. 1.2 cm benign appearing cyst in the posterior aspect of the right lobe of the liver.  MRI Liver 05/30/18 with [facility]: IMPRESSION: 1. There are 5 nonenhancing liver lesions compatible with cysts. One of these is in the left hepatic lobe and is a candidate structure for the lesion seen at ultrasound. At ultrasound the lesion had some faint internal echoes and the possibility of a faintly complex cyst is noted, but the lack of enhancement would seem to argue against malignancy. 2. Scattered peripheral small foci of early arterial  phase enhancement in the liver are likely from small vascular malformations. These resolve on later arterial phase and subsequent imaging, without capsule or washout appearance to suggest hepatocellular carcinoma. 3. Reticular enhancement throughout the liver on arterial phase images favoring diffuse fibrosis and underlying cirrhosis. 4.  Aortic Atherosclerosis.   Past Medical History:  Diagnosis Date   Arthritis    Cavities 02/02/2015   GERD (gastroesophageal reflux disease) Dx 2014   GI bleed    HIV (human immunodeficiency virus infection) ([place]) Dx 2015   Hypertension Dx 2014   Primary biliary cirrhosis ([place]) 07/03/2018   Substance abuse ([place])    Quit 2008   Ulcerative colitis [facility])     Past Surgical History:  Procedure Laterality Date   ABDOMINAL HYSTERECTOMY  2001    done in [facility], patient unsure of indication   TEE WITHOUT CARDIOVERSION N/A 05/02/2014   Procedure: TRANSESOPHAGEAL ECHOCARDIOGRAM (TEE);  Surgeon: Larey Dresser, MD;  Location: [place];  Service: Cardiovascular;  Laterality: N/A;   VIDEO BRONCHOSCOPY Bilateral 04/27/2014   Procedure: VIDEO BRONCHOSCOPY WITH FLUORO;  Surgeon: Rigoberto Noel, MD;  Location: [facility];  Service: Cardiopulmonary;  Laterality: Bilateral;    Current Outpatient Medications  Medication Sig Dispense Refill   amLODipine (NORVASC) 10 MG tablet TAKE 1 TABLET (10 MG) BY MOUTH DAILY 90 tablet 0   atorvastatin (LIPITOR) 10 MG tablet Take 1 tablet (10 mg total) by mouth daily. 90 tablet 6   bictegravir-emtricitabine-tenofovir AF (BIKTARVY) 50-200-25 MG TABS tablet Take 1 tablet by mouth daily. 30 tablet 11   feeding supplement, ENSURE COMPLETE, (ENSURE COMPLETE) LIQD Take 237 mLs by mouth 3 (three) times daily between meals. 237 mL 0   FEROSUL 325 (65 Fe) MG tablet TAKE 1 TABLET BY MOUTH DAILY WITH BREAKFAST. 90 tablet 3   hyoscyamine (LEVSIN, ANASPAZ) 0.125 MG tablet Take 1 tablet (0.125 mg total) by  mouth every 6 (six) hours as needed. 120 tablet 0   lisinopril (ZESTRIL) 5 MG tablet TAKE 1 TABLET BY MOUTH DAILY. 90 tablet 0   pantoprazole (PROTONIX) 40 MG tablet Take 1 tablet (40 mg total) by mouth daily. 30 tablet 3   sulfaSALAzine (AZULFIDINE) 500 MG tablet Take 2 tablets (1,000 mg total) by mouth 2 (two) times daily. 120 tablet 6   No current facility-administered medications for this visit.     Allergies as of 04/14/2019   (No Known Allergies)    Family History  Problem Relation Age of Onset   Cancer Father        ?  Alcoholism Father    Diabetes Mother    Stroke Mother    Pancreatitis Sister    Diabetes Sister    Cancer Paternal Uncle 58       colon    Colon cancer Paternal Uncle    Prostate cancer Brother    Esophageal cancer Neg Hx    Rectal cancer Neg Hx    Stomach cancer Neg Hx     Social History   Socioeconomic History   Marital status: Single    Spouse name: Not on file   Number of children: Not on file   Years of education: Not on file   Highest education level: Not on file  Occupational History   Not on file  Social Needs   Financial resource strain: Not on file   Food insecurity    Worry: Not on file    Inability: Not on file   Transportation needs    Medical: Not on file    Non-medical: Not on file  Tobacco Use   Smoking status: Former Smoker    Packs/day: 0.20    Years: 35.00    Pack years: 7.00    Types: Cigarettes   Smokeless tobacco: Never Used   Tobacco comment: Smoking 2-3 cigs per day  Substance and Sexual Activity   Alcohol use: No    Alcohol/week: 0.0 standard drinks   Drug use: Yes    Types: Marijuana    Comment: no drugs per patient since 2009; previous cocaine use   Sexual activity: Not Currently    Birth control/protection: Other-see comments, Surgical    Comment: offered condoms; 03/2019  Lifestyle   Physical activity    Days per week: Not on file    Minutes per session: Not on file    Stress: Not on file  Relationships   Social connections    Talks on phone: Not on file    Gets together: Not on file    Attends religious service: Not on file    Active member of club or organization: Not on file    Attends meetings of clubs or organizations: Not on file    Relationship status: Not on file   Intimate partner violence    Fear of current or ex partner: Not on file    Emotionally abused: Not on file    Physically abused: Not on file    Forced sexual activity: Not on file  Other Topics Concern   Not on file  Social History Narrative   Not on file    Physical Exam: General:   Alert,  well-nourished, pleasant and cooperative in NAD. Diffuse loss of muscle mass.  Head:  Normocephalic and atraumatic. Eyes:  Sclera clear, no icterus.   Conjunctiva pink. Ears:  Normal auditory acuity. Nose:  No deformity, discharge,  or lesions. Mouth:  No deformity or lesions.   Neck:  Supple; no masses or thyromegaly. Lungs:  Clear throughout to auscultation.   No wheezes. Heart:  Regular rate and rhythm; no murmurs. Abdomen:  Thin, soft,nontender, nondistended, normal bowel sounds, no rebound or guarding. No hepatosplenomegaly.  No fluid wave.  Rectal:  Deferred  Msk:  Symmetrical. No boney deformities. LAD: No inguinal or umbilical LAD. Extremities:  No clubbing or edema. Neurologic:  Alert and  oriented x4;  grossly nonfocal Skin:  Intact without significant lesions or rashes. No palmar erythema or spider angioma.  Psych:  Alert and cooperative. Normal mood and affect.    Umaiza Matusik L. Tarri Glenn, MD, MPH 04/13/2019,  8:38 PM

## 2019-04-14 ENCOUNTER — Ambulatory Visit (INDEPENDENT_AMBULATORY_CARE_PROVIDER_SITE_OTHER): Payer: Medicare Other | Admitting: Gastroenterology

## 2019-04-14 ENCOUNTER — Encounter: Payer: Self-pay | Admitting: Gastroenterology

## 2019-04-14 VITALS — BP 118/64 | HR 78 | Temp 97.1°F | Ht 67.0 in | Wt 125.0 lb

## 2019-04-14 DIAGNOSIS — R748 Abnormal levels of other serum enzymes: Secondary | ICD-10-CM

## 2019-04-14 DIAGNOSIS — K743 Primary biliary cirrhosis: Secondary | ICD-10-CM

## 2019-04-14 DIAGNOSIS — K5100 Ulcerative (chronic) pancolitis without complications: Secondary | ICD-10-CM | POA: Diagnosis not present

## 2019-04-14 DIAGNOSIS — K219 Gastro-esophageal reflux disease without esophagitis: Secondary | ICD-10-CM | POA: Diagnosis not present

## 2019-04-14 LAB — URINE CYTOLOGY ANCILLARY ONLY
Chlamydia: NEGATIVE
Comment: NEGATIVE
Comment: NORMAL
Neisseria Gonorrhea: NEGATIVE

## 2019-04-14 MED ORDER — BUDESONIDE 3 MG PO CPEP: 9.0000 mg | ORAL_CAPSULE | Freq: Every day | ORAL | 0 refills | Status: DC

## 2019-04-14 MED ORDER — PANTOPRAZOLE SODIUM 40 MG PO TBEC: 40.0000 mg | DELAYED_RELEASE_TABLET | Freq: Two times a day (BID) | ORAL | 0 refills | Status: DC

## 2019-04-14 MED ORDER — URSODIOL 250 MG PO TABS: 250.0000 mg | ORAL_TABLET | Freq: Two times a day (BID) | ORAL | 0 refills | Status: DC

## 2019-04-14 MED FILL — PANTOPRAZOLE SOD DR 40 MG T: 40 | 90 days supply | Qty: 180 | Fill #0

## 2019-04-14 MED FILL — BUDESONIDE 3 MG CAP: 3 | 90 days supply | Qty: 270 | Fill #0

## 2019-04-14 MED FILL — URSODIOL 250 MG TABLET: 250 | 90 days supply | Qty: 180 | Fill #0

## 2019-04-14 NOTE — Patient Instructions (Signed)
PLAN:  We have sent the following medications to your pharmacy for you to pick up at your convenience:  -Ursidol 250 mg 2 tablets BID - Budesonde 9 mg daily - Continue pantoprazole 40 mg twice daily   - Trial of FDGard for symptom control  - have some repeat labwork done one week before her next appointment  - Return in one month

## 2019-04-15 LAB — CBC WITH DIFFERENTIAL/PLATELET
Absolute Monocytes: 420 cells/uL (ref 200–950)
Basophils Absolute: 30 cells/uL (ref 0–200)
Basophils Relative: 0.6 %
Eosinophils Absolute: 110 cells/uL (ref 15–500)
Eosinophils Relative: 2.2 %
HCT: 36.5 % (ref 35.0–45.0)
Hemoglobin: 12.4 g/dL (ref 11.7–15.5)
Lymphs Abs: 2495 cells/uL (ref 850–3900)
MCH: 31.2 pg (ref 27.0–33.0)
MCHC: 34 g/dL (ref 32.0–36.0)
MCV: 91.9 fL (ref 80.0–100.0)
MPV: 10.9 fL (ref 7.5–12.5)
Monocytes Relative: 8.4 %
Neutro Abs: 1945 cells/uL (ref 1500–7800)
Neutrophils Relative %: 38.9 %
Platelets: 347 10*3/uL (ref 140–400)
RBC: 3.97 10*6/uL (ref 3.80–5.10)
RDW: 14.5 % (ref 11.0–15.0)
Total Lymphocyte: 49.9 %
WBC: 5 10*3/uL (ref 3.8–10.8)

## 2019-04-15 LAB — RPR: RPR Ser Ql: NONREACTIVE

## 2019-04-15 LAB — HIV-1 RNA QUANT-NO REFLEX-BLD
HIV 1 RNA Quant: <20 copies/mL
HIV-1 RNA Quant, Log: <1.3 Log copies/mL

## 2019-04-15 LAB — COMPLETE METABOLIC PANEL WITH GFR
AG Ratio: 1.5 (calc) (ref 1.0–2.5)
ALT: 31 U/L — ABNORMAL HIGH (ref 6–29)
AST: 41 U/L — ABNORMAL HIGH (ref 10–35)
Albumin: 4.8 g/dL (ref 3.6–5.1)
Alkaline phosphatase (APISO): 481 U/L — ABNORMAL HIGH (ref 37–153)
BUN: 11 mg/dL (ref 7–25)
CO2: 27 mmol/L (ref 20–32)
Calcium: 10.2 mg/dL (ref 8.6–10.4)
Chloride: 106 mmol/L (ref 98–110)
Creat: 0.95 mg/dL (ref 0.50–0.99)
GFR, Est African American: 75 mL/min/{1.73_m2} (ref >=60)
GFR, Est Non African American: 65 mL/min/{1.73_m2} (ref >=60)
Globulin: 3.3 g/dL (calc) (ref 1.9–3.7)
Glucose, Bld: 86 mg/dL (ref 65–99)
Potassium: 4.3 mmol/L (ref 3.5–5.3)
Sodium: 143 mmol/L (ref 135–146)
Total Bilirubin: 1 mg/dL (ref 0.2–1.2)
Total Protein: 8.1 g/dL (ref 6.1–8.1)

## 2019-04-15 LAB — LIPID PANEL
Cholesterol: 248 mg/dL — ABNORMAL HIGH (ref <200)
HDL: 76 mg/dL (ref >=50)
LDL Cholesterol (Calc): 155 mg/dL (calc) — ABNORMAL HIGH
Non-HDL Cholesterol (Calc): 172 mg/dL (calc) — ABNORMAL HIGH (ref <130)
Total CHOL/HDL Ratio: 3.3 (calc) (ref <5.0)
Triglycerides: 73 mg/dL (ref <150)

## 2019-04-16 ENCOUNTER — Telehealth: Payer: Self-pay | Admitting: *Deleted

## 2019-04-16 ENCOUNTER — Ambulatory Visit: Payer: Medicare Other | Admitting: Obstetrics and Gynecology

## 2019-04-16 NOTE — Telephone Encounter (Signed)
Left message for the patient to call back to scheduled repeat EGD per Dr. Tarri Glenn procedure report.

## 2019-04-19 ENCOUNTER — Encounter: Payer: Self-pay | Admitting: *Deleted

## 2019-04-19 ENCOUNTER — Other Ambulatory Visit: Payer: Self-pay | Admitting: *Deleted

## 2019-04-19 DIAGNOSIS — K259 Gastric ulcer, unspecified as acute or chronic, without hemorrhage or perforation: Secondary | ICD-10-CM

## 2019-04-19 DIAGNOSIS — Z1159 Encounter for screening for other viral diseases: Secondary | ICD-10-CM

## 2019-04-19 NOTE — Telephone Encounter (Signed)
Spoke to the patient who has been scheduled for and notified concerning the following:   05/25/2019 at 1:50 pm (f/u visit, will be given EGD instructions)  06/01/2019 at 9:30 am COVID screening (order in [place])  06/03/2019 at 9:30 am repeat EGD (amb referral placed in Epic)  Reminder letter sent to the patient. All appointments discussed with the patient.

## 2019-04-30 ENCOUNTER — Encounter: Payer: Self-pay | Admitting: Gastroenterology

## 2019-04-30 ENCOUNTER — Other Ambulatory Visit: Payer: Self-pay | Admitting: Internal Medicine

## 2019-04-30 DIAGNOSIS — I10 Essential (primary) hypertension: Secondary | ICD-10-CM

## 2019-05-01 MED FILL — ATORVASTATIN 10 MG TABLET: 10 | 90 days supply | Qty: 90 | Fill #2

## 2019-05-03 ENCOUNTER — Ambulatory Visit (INDEPENDENT_AMBULATORY_CARE_PROVIDER_SITE_OTHER): Payer: Medicare Other | Admitting: Obstetrics and Gynecology

## 2019-05-03 ENCOUNTER — Encounter: Payer: Self-pay | Admitting: Obstetrics and Gynecology

## 2019-05-03 ENCOUNTER — Other Ambulatory Visit (HOSPITAL_COMMUNITY)
Admission: RE | Admit: 2019-05-03 | Discharge: 2019-05-03 | Disposition: A | Discharge status: Home / Self Care | Payer: Medicare Other | Source: Ambulatory Visit | Attending: Obstetrics and Gynecology | Admitting: Obstetrics and Gynecology

## 2019-05-03 ENCOUNTER — Other Ambulatory Visit: Payer: Self-pay

## 2019-05-03 VITALS — BP 133/73 | HR 65 | Wt 124.6 lb

## 2019-05-03 DIAGNOSIS — N890 Mild vaginal dysplasia: Secondary | ICD-10-CM | POA: Diagnosis not present

## 2019-05-03 DIAGNOSIS — R87622 Low grade squamous intraepithelial lesion on cytologic smear of vagina (LGSIL): Secondary | ICD-10-CM

## 2019-05-03 LAB — POCT PREGNANCY, URINE: Preg Test, Ur: NEGATIVE

## 2019-05-03 NOTE — Patient Instructions (Signed)
Colposcopy, Care After This sheet gives you information about how to care for yourself after your procedure. Your doctor may also give you more specific instructions. If you have problems or questions, contact your doctor. What can I expect after the procedure? If you did not have a tissue sample removed (did not have a biopsy), you may only have some spotting for a few days. You can go back to your normal activities. If you had a tissue sample removed, it is common to have:  Soreness and pain. This may last for a few days.  Light-headedness.  Mild bleeding from your vagina or dark-colored, grainy discharge from your vagina. This may last for a few days. You may need to wear a sanitary pad.  Spotting for at least 48 hours after the procedure. Follow these instructions at home:   Take over-the-counter and prescription medicines only as told by your doctor. Ask your doctor what medicines you can start taking again. This is very important if you take blood-thinning medicine.  Do not drive or use heavy machinery while taking prescription pain medicine.  For 3 days, or as long as your doctor tells you, avoid: ? Douching. ? Using tampons. ? Having sex.  If you use birth control (contraception), keep using it.  Limit activity for the first day after the procedure. Ask your doctor what activities are safe for you.  It is up to you to get the results of your procedure. Ask your doctor when your results will be ready.  Keep all follow-up visits as told by your doctor. This is important. Contact a doctor if:  You get a skin rash. Get help right away if:  You are bleeding a lot from your vagina. It is a lot of bleeding if you are using more than one pad an hour for 2 hours in a row.  You have clumps of blood (blood clots) coming from your vagina.  You have a fever.  You have chills  You have pain in your lower belly (pelvic area).  You have signs of infection, such as vaginal  discharge that is: ? Different than usual. ? Yellow. ? Bad-smelling.  You have very pain or cramps in your lower belly that do not get better with medicine.  You feel light-headed.  You feel dizzy.  You pass out (faint). Summary  If you did not have a tissue sample removed (did not have a biopsy), you may only have some spotting for a few days. You can go back to your normal activities.  If you had a tissue sample removed, it is common to have mild pain and spotting for 48 hours.  For 3 days, or as long as your doctor tells you, avoid douching, using tampons and having sex.  Get help right away if you have bleeding, very bad pain, or signs of infection. This information is not intended to replace advice given to you by your health care provider. Make sure you discuss any questions you have with your health care provider. Document Released: 11/20/2007 Document Revised: 05/16/2017 Document Reviewed: 02/21/2016 Elsevier Patient Education  2020 [employer].

## 2019-05-03 NOTE — Progress Notes (Signed)
    GYNECOLOGY CLINIC COLPOSCOPY PROCEDURE NOTE  58 y.o. [id number] here for colposcopy for low-grade squamous intraepithelial neoplasia (LGSIL - encompassing HPV,mild dysplasia,CIN I) pap smear . H/O TAH. H/O abnormal pap smears in the past. Colpo in past without Bx  Patient given informed consent, signed copy in the chart, time out was performed.  Placed in lithotomy position. Cervix absent. Vaginal mucosa soaked with acetic acid. No visible lesions noted. Slightly atrophic. Random Bx obtained. Monsels applied. Bx to pathology. Pt tolerated procedure well    Patient was given post procedure instructions.  Will follow up pathology and manage accordingly.  Routine preventative health maintenance measures emphasized.    Arlina Robes, MD, [place] Attending [place] for [place]

## 2019-05-04 LAB — SURGICAL PATHOLOGY

## 2019-05-05 MED FILL — AMLODIPINE BESYLATE 10 MG T: 10 | 90 days supply | Qty: 90 | Fill #0

## 2019-05-05 MED FILL — BIKTARVY 50-200-25 MG TABS: 50-200-25 | 30 days supply | Qty: 30 | Fill #8

## 2019-05-17 ENCOUNTER — Telehealth: Payer: Self-pay

## 2019-05-17 NOTE — Telephone Encounter (Signed)
Called pt with Biopsy results, explained to pt what VAIN 1 means ,advised to get PAP in 1 yr, Pt verbalized understanding.

## 2019-05-17 NOTE — Telephone Encounter (Signed)
-----   Message from Chancy Milroy, MD sent at 05/14/2019 11:52 AM EST ----- Please let pt know that her vaginal Bx were VAIN 1 Discussed with GYN/Onc, who recommends repeat vaginal smear with HPV co testing in 1 yr She can have this done with me at the clinic Thanks Legrand Como

## 2019-05-18 ENCOUNTER — Encounter: Payer: Self-pay | Admitting: *Deleted

## 2019-05-21 ENCOUNTER — Telehealth: Payer: Self-pay | Admitting: Emergency Medicine

## 2019-05-21 NOTE — Telephone Encounter (Signed)
Left patient voicemail to remind her to have labs done before her appointment on 12/9

## 2019-05-25 ENCOUNTER — Ambulatory Visit: Payer: Medicare Other | Admitting: Gastroenterology

## 2019-05-26 ENCOUNTER — Encounter: Payer: Self-pay | Admitting: Gastroenterology

## 2019-05-26 ENCOUNTER — Ambulatory Visit (INDEPENDENT_AMBULATORY_CARE_PROVIDER_SITE_OTHER): Payer: Medicare Other | Admitting: Gastroenterology

## 2019-05-26 ENCOUNTER — Other Ambulatory Visit: Payer: Self-pay

## 2019-05-26 ENCOUNTER — Other Ambulatory Visit (INDEPENDENT_AMBULATORY_CARE_PROVIDER_SITE_OTHER): Payer: Medicare Other

## 2019-05-26 VITALS — BP 132/64 | HR 76 | Temp 98.0°F | Ht 68.0 in | Wt 124.8 lb

## 2019-05-26 DIAGNOSIS — K743 Primary biliary cirrhosis: Secondary | ICD-10-CM | POA: Diagnosis not present

## 2019-05-26 DIAGNOSIS — K5100 Ulcerative (chronic) pancolitis without complications: Secondary | ICD-10-CM

## 2019-05-26 DIAGNOSIS — Z1159 Encounter for screening for other viral diseases: Secondary | ICD-10-CM

## 2019-05-26 DIAGNOSIS — K259 Gastric ulcer, unspecified as acute or chronic, without hemorrhage or perforation: Secondary | ICD-10-CM

## 2019-05-26 DIAGNOSIS — K219 Gastro-esophageal reflux disease without esophagitis: Secondary | ICD-10-CM | POA: Diagnosis not present

## 2019-05-26 DIAGNOSIS — R748 Abnormal levels of other serum enzymes: Secondary | ICD-10-CM

## 2019-05-26 DIAGNOSIS — K754 Autoimmune hepatitis: Secondary | ICD-10-CM

## 2019-05-26 LAB — COMPREHENSIVE METABOLIC PANEL
ALT: 23 U/L (ref 0–35)
AST: 30 U/L (ref 0–37)
Albumin: 4.3 g/dL (ref 3.5–5.2)
Alkaline Phosphatase: 233 U/L — ABNORMAL HIGH (ref 39–117)
BUN: 20 mg/dL (ref 6–23)
CO2: 29 mEq/L (ref 19–32)
Calcium: 9.5 mg/dL (ref 8.4–10.5)
Chloride: 105 mEq/L (ref 96–112)
Creatinine, Ser: 0.9 mg/dL (ref 0.40–1.20)
GFR: 77.21 mL/min (ref >60.00)
Glucose, Bld: 83 mg/dL (ref 70–99)
Potassium: 3.8 mEq/L (ref 3.5–5.1)
Sodium: 140 mEq/L (ref 135–145)
Total Bilirubin: 0.5 mg/dL (ref 0.2–1.2)
Total Protein: 7.8 g/dL (ref 6.0–8.3)

## 2019-05-26 NOTE — Patient Instructions (Signed)
-   Continue UDCA at a total daily dose of 13 to 15 mg/kg for 250 mg 2 tablets BID - Continue Budesonde 9 mg daily - Continue pantoprazole 40 mg twice daily - Continue FDGard for symptom control - CMP, IgG to monitor response to treatment - EGD planned 06/03/19 - Return to clinic in 2-3 months

## 2019-05-26 NOTE — Progress Notes (Signed)
Referring Provider: Ladell Pier, MD Primary Care Physician:  Ladell Pier, MD  Chief complaint: PBC   IMPRESSION:  PBC with autoimmune hepatitis overlap with abnormal liver enzymes    -  Alk phos elevated dating back to 2015      - magnitude of elevation has increased in the last couple of years.          - Transaminases are mildly elevated. Total bilirubin is normal.     - Liver biopsy 04/20/14: chronic portal hepatitis pattern of injury with marked duct injury, consistent with PBC.       - Stage 2-3 PBC of 4. Reviewed at [facility] 03/01/2019: ANA 1:1280, IgG 1793, AMA 215.5, IgM 121    - Liver biopsy 03/15/2019 showed 2-3 fibrosis    - Abdominal imaging has suggested cirrhosis    - FIB-4 1.56 and APRI 0.450 do not support a diagnosis of cirrhosis Postprandial bloating and distension    - May be related to gastric antral ulcer    - Gastric biopsies negative for H. pylori, duodenal biopsies negative for celiac GERD with a small hiatal hernia 12 mm cratered gastric antral ulcer on upper endoscopy 03/31/19 Quiescent ulcerative colitis    - Random colon biopsies 12/16/2007: diffuse active chronic colitis, possibly UC    - Random colon biopsies 12/23/13: diffuse chronic active colitis compatible with idiopathic inflammatory bowel disorder;     - Abnormal CT scan 04/2014: thickened left colon    - diagnosed with UC while hospitalized in [facility] 05/2014    - Colonoscopy 03/31/2019 shows pancolonic pseudopolyps but no active colitis Abnormal ultrasound and liver MRI    - CT a/p with contrast at [facility] 11/13/17:        - enteritis, mild gastritis, mild hepatomegaly, liver lesions    - ultrasound 05/08/18: 1.3 cm solid mass in the left liver lobe       - 0.6 cm irregular hypoechoic mass in the right liver lobe       - 1.2 cm benign appearing cyst in the right liver lobe    - MRI 05/30/18: suspected cirrhosis       - 5 small lesion in the liver, likely small cysts     - MRI/MRCP 03/17/2019 showed cirrhosis without liver mass.  Spleen was normal.  No ascites. HIV on BIKTARVY  Family history of stomach cancer (Father)   Advanced liver disease: She has tolerated 8 weeks of weight-based ursodeoxycholic acid and budesonide for PBC with autoimmune hepatitis overlap.  There is some fibrosis but no evidence on biopsy for advanced fibrosis.  However, imaging suggest the possibility of cirrhosis.  There is no portal hypertension by upper endoscopy.  We will repeat labs today to monitor her response to therapy.  Gastric ulcer: She has completed 8 weeks of twice daily proton pump inhibitor therapy.  Follow-up EGD recommended to document healing of her unexplained gastric ulcer.  Prior biopsies were negative for H. pylori.  She denied the use of NSAIDs.  Bloating: Etiology is unclear but may be related to active autoimmune hepatitis.  Some clinical improvement on pantoprazole 40 mg twice daily and FD guard.  No alarm features.  Ulcerative colitis: Recent colonoscopy shows no endoscopic or histopathology consistent with active colitis.  No symptoms suggestive of active disease.  Abnormal MRI: Given her prior abnormal MRI and suspected underlying advanced fibrosis/early cirrhosis, will need high quality imaging every 6 months given the risk of  liver cancer. Will trend AFP levels.    PLAN: - Continue UDCA at a total daily dose of 13 to 15 mg/kg for 250 mg 2 tablets BID - Continue Budesonde 9 mg daily - Continue pantoprazole 40 mg twice daily until follow-up EGD - Continue FDGard for symptom control - CMP, IgG to monitor response to treatment - EGD planned 06/03/19 to assess for healing of her antral gastric ulcer - MRI April 2021 to follow-up on her abnormal MRI 12/19 - Return to clinic in 2-3 months  Please see the "Patient Instructions" section for addition details about the plan.  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 Lives in [place]. Previously followed by a gastroenterologist in [facility] but she cannot remember their name. 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  Her initial consultation was performed 03/01/2019. She has a history of HTN, HL, IDA, GERD,LGSILwith positive HPV(neg colpo 07/10/2017),formertobacco, HIV on BIKTARVY, cavitary lung lesion, ulcerative colitis and calf cramps. History of substance abuse but has not used any drugs for 3 years.   Reports 100% adherence with medications. Although then commented that the [facility] was too expensive.  Reflux has improved on treatment.  Bloating has improved. No diarrhea. She feels like her bowel habits have normalized. Having one formed BM daily. No blood or mucous. Appetite is improving.  She is eating more and thinks she has gained some weight.   Walks every day.  Continues to use marijuana occasionally for appetite stimulation. She has no new complaints or concerns at this time.   Recent evaluation has included: - Labs 09/08/18: WBC 4.5, hgb 11.3, platelets 341, MCV 92.2, RDW 15.1 - Labs 03/01/2019: Normal CMP except for a calcium of 10.6, alk phos 532, total bilirubin 0.5, AST 51, ALT 41, albumin 4.6, IgG 1793, IgM 121, INR 1.0, ANA positive at 06-1278, ACE less than 5, AMA to 15.5 - MRI/MRCP 03/17/2019 showed cirrhosis without liver mass.  Spleen was normal.  No ascites. - Liver biopsy was performed 03/15/2019 showing PBC with features of overlapping autoimmune hepatitis.  Stage 2-3 out of 4 fibrosis. - Colonoscopy was performed 03/31/2019 given her history of ulcerative colitis.  She had pancolonic pseudopolyps and hemorrhoids.  Stool in the entire examined colon limited this from being a high quality screening colonoscopy.  Biopsies from the terminal ileum and throughout the colon were completely normal. - Upper endoscopy 03/31/19 showed a small hiatal hernia, 12 mm cratered gastric antral ulcer.  Esophageal biopsies showed reflux without  eosinophilic esophagitis or Barrett's.  She had chronic inactive gastritis without H. pylori.  Duodenal biopsies were normal.  Prior records from her evaluation with Dr. Deatra Ina in 2016: She has a history of drug abuse, AIDS, hypertension and a cavitary lesion of the lung.  She was told that she has colitis while recently hospitalized in [facility] although she is unsure whether she had a colonoscopy.  A CT scan in November, 2015, demonstrated thickening of the left colon.  She complains of chronic abdominal pain.  Stools are intermittently loose and she has had some limited rectal bleeding.  On May 16, 2014 hemoglobin was 8. CT scan in November, 2015 demonstrated thickening of the left colon.  C. difficile toxin was negative.  She was apparently hospitalized in [facility] last month and was told she has ulcerative colitis but she can provide no details.  Patient claims have chronic abdominal pain that is not controlled with Tylenol No. 3.  She's  asking for "stronger meds". Before embarking on any workup we need to review her prior records.  She has been given pain medications through the clinic here in [place] and I told her that I will not prescribe any further pain medications.  The patient did not follow-up. It does not appear that the records from [facility] were ever located. I am unable to find them in [place].   Prior abdominal imaging:  Ultrasound 05/08/18:  1.3 cm solid appearing mass in the anterior aspect o the left lobe of the liver that has increased in size from 0.6 cm on 11/13/2017. This is concerning for a primary or metastatic neoplasm. A previously demonstrated 2nd possibly solid mass in the periphery of the right lobe of the liver, laterally, could not be visualized today. A 0.6 cm irregular hypoechoic mass in the right lobe of the liver and a 1.2 cm benign appearing cyst in the posterior aspect of the right lobe of the liver are thought to be cysts.  MRI Liver 05/30/18  with Gadovist: 5 nonenhancing liver lesions compatible with cysts. One of these is in the left hepatic lobe and is a candidate structure for the lesion seen at ultrasound. At ultrasound the lesion had some faint internal echoes and the possibility of a faintly complex cyst is noted, but the lack of enhancement would seem to argue against malignancy. Scattered peripheral small foci of early arterial phase enhancement in the liver are likely from small vascular malformations. These resolve on later arterial phase and subsequent imaging, without capsule or washout appearance to suggest hepatocellular carcinoma.  Reticular enhancement throughout the liver on arterial phase images favoring diffuse fibrosis and underlying cirrhosis.Aortic Atherosclerosis.  MRI/MRCP 03/17/2019 showed cirrhosis without liver mass.  Spleen was normal.  No ascites.   Past Medical History:  Diagnosis Date  . Arthritis   . Cavities 02/02/2015  . GERD (gastroesophageal reflux disease) Dx 2014  . GI bleed   . HIV (human immunodeficiency virus infection) ([place]) Dx 2015  . Hypertension Dx 2014  . Primary biliary cirrhosis ([place]) 07/03/2018  . Substance abuse ([place])    Quit 2008  . Ulcerative colitis [facility])     Past Surgical History:  Procedure Laterality Date  . ABDOMINAL HYSTERECTOMY  2001    done in [facility], patient unsure of indication  . TEE WITHOUT CARDIOVERSION N/A 05/02/2014   Procedure: TRANSESOPHAGEAL ECHOCARDIOGRAM (TEE);  Surgeon: Larey Dresser, MD;  Location: [facility];  Service: Cardiovascular;  Laterality: N/A;  . VIDEO BRONCHOSCOPY Bilateral 04/27/2014   Procedure: VIDEO BRONCHOSCOPY WITH FLUORO;  Surgeon: Rigoberto Noel, MD;  Location: [facility];  Service: Cardiopulmonary;  Laterality: Bilateral;    Current Outpatient Medications  Medication Sig Dispense Refill  . amLODipine (NORVASC) 10 MG tablet TAKE 1 TABLET BY MOUTH DAILY 90 tablet 0  . atorvastatin (LIPITOR) 10 MG tablet Take 1  tablet (10 mg total) by mouth daily. 90 tablet 6  . bictegravir-emtricitabine-tenofovir AF (BIKTARVY) 50-200-25 MG TABS tablet Take 1 tablet by mouth daily. 30 tablet 11  . budesonide (ENTOCORT EC) 3 MG 24 hr capsule Take 3 capsules (9 mg total) by mouth daily. 270 capsule 0  . feeding supplement, ENSURE COMPLETE, (ENSURE COMPLETE) LIQD Take 237 mLs by mouth 3 (three) times daily between meals. 237 mL 0  . FEROSUL 325 (65 Fe) MG tablet TAKE 1 TABLET BY MOUTH DAILY WITH BREAKFAST. 90 tablet 3  . hyoscyamine (LEVSIN, ANASPAZ) 0.125 MG tablet Take 1 tablet (0.125 mg total)  by mouth every 6 (six) hours as needed. 120 tablet 0  . lisinopril (ZESTRIL) 5 MG tablet TAKE 1 TABLET BY MOUTH DAILY. 90 tablet 0  . pantoprazole (PROTONIX) 40 MG tablet Take 1 tablet (40 mg total) by mouth 2 (two) times daily before a meal. 180 tablet 0  . sulfaSALAzine (AZULFIDINE) 500 MG tablet Take 2 tablets (1,000 mg total) by mouth 2 (two) times daily. 120 tablet 6  . ursodiol (ACTIGALL) 250 MG tablet Take 1 tablet (250 mg total) by mouth 2 (two) times daily. 180 tablet 0   No current facility-administered medications for this visit.     Allergies as of 05/26/2019  . (No Known Allergies)    Family History  Problem Relation Age of Onset  . Cancer Father        ?  Marland Kitchen Alcoholism Father   . Diabetes Mother   . Stroke Mother   . Pancreatitis Sister   . Diabetes Sister   . Colon cancer Paternal Uncle 69  . Prostate cancer Brother   . Esophageal cancer Neg Hx   . Rectal cancer Neg Hx   . Stomach cancer Neg Hx     Social History   Socioeconomic History  . Marital status: Single    Spouse name: Not on file  . Number of children: Not on file  . Years of education: Not on file  . Highest education level: Not on file  Occupational History  . Not on file  Social Needs  . Financial resource strain: Not on file  . Food insecurity    Worry: Not on file    Inability: Not on file  . Transportation needs     Medical: Not on file    Non-medical: Not on file  Tobacco Use  . Smoking status: Former Smoker    Packs/day: 0.20    Years: 35.00    Pack years: 7.00    Types: Cigarettes  . Smokeless tobacco: Never Used  . Tobacco comment: Smoking 2-3 cigs per day  Substance and Sexual Activity  . Alcohol use: No    Alcohol/week: 0.0 standard drinks  . Drug use: Yes    Types: Marijuana    Comment: no drugs per patient since 2009; previous cocaine use  . Sexual activity: Not Currently    Birth control/protection: Other-see comments, Surgical    Comment: offered condoms; 03/2019  Lifestyle  . Physical activity    Days per week: Not on file    Minutes per session: Not on file  . Stress: Not on file  Relationships  . Social Herbalist on phone: Not on file    Gets together: Not on file    Attends religious service: Not on file    Active member of club or organization: Not on file    Attends meetings of clubs or organizations: Not on file    Relationship status: Not on file  . Intimate partner violence    Fear of current or ex partner: Not on file    Emotionally abused: Not on file    Physically abused: Not on file    Forced sexual activity: Not on file  Other Topics Concern  . Not on file  Social History Narrative  . Not on file    Physical Exam: General:   Alert,  well-nourished, pleasant and cooperative in NAD. Diffuse loss of muscle mass. Looks well today.  Head:  Normocephalic and atraumatic. Eyes:  Sclera clear, no icterus.  Conjunctiva pink. Ears:  Normal auditory acuity. Nose:  No deformity, discharge,  or lesions. Mouth:  No deformity or lesions.   Neck:  Supple; no masses or thyromegaly. Lungs:  Clear throughout to auscultation.   No wheezes. Heart:  Regular rate and rhythm; no murmurs. Abdomen:  Thin, soft,nontender, nondistended, normal bowel sounds, no rebound or guarding. No hepatosplenomegaly.  No fluid wave.  Rectal:  Deferred  Msk:  Symmetrical. No boney  deformities. LAD: No inguinal or umbilical LAD. Extremities:  No clubbing or edema. Neurologic:  Alert and  oriented x4;  grossly nonfocal Skin:  Intact without significant lesions or rashes. No palmar erythema or spider angioma.  Psych:  Alert and cooperative. Normal mood and affect.    [name] Lasecki L. Tarri Glenn, MD, MPH 05/26/2019, 1:48 PM

## 2019-05-27 LAB — IGG: IgG (Immunoglobin G), Serum: 1557 mg/dL (ref 600–1640)

## 2019-05-31 ENCOUNTER — Other Ambulatory Visit: Payer: Self-pay | Admitting: Internal Medicine

## 2019-05-31 DIAGNOSIS — K5190 Ulcerative colitis, unspecified, without complications: Secondary | ICD-10-CM

## 2019-05-31 MED FILL — sulfaSALAzine 500 MG TABS: 500 | 30 days supply | Qty: 120 | Fill #0

## 2019-06-03 ENCOUNTER — Encounter: Payer: Medicare Other | Admitting: Gastroenterology

## 2019-06-08 MED FILL — BIKTARVY 50-200-25 MG TABS: 50-200-25 | 30 days supply | Qty: 30 | Fill #9

## 2019-06-15 ENCOUNTER — Other Ambulatory Visit: Payer: Self-pay | Admitting: Gastroenterology

## 2019-06-15 ENCOUNTER — Ambulatory Visit (INDEPENDENT_AMBULATORY_CARE_PROVIDER_SITE_OTHER): Payer: Medicare Other

## 2019-06-15 DIAGNOSIS — Z1159 Encounter for screening for other viral diseases: Secondary | ICD-10-CM

## 2019-06-15 LAB — SARS CORONAVIRUS 2 (TAT 6-24 HRS): SARS Coronavirus 2: NEGATIVE

## 2019-06-17 ENCOUNTER — Other Ambulatory Visit: Payer: Self-pay

## 2019-06-17 ENCOUNTER — Ambulatory Visit (AMBULATORY_SURGERY_CENTER): Payer: Medicare Other | Admitting: Gastroenterology

## 2019-06-17 ENCOUNTER — Encounter: Payer: Self-pay | Admitting: Gastroenterology

## 2019-06-17 VITALS — BP 143/81 | HR 72 | Temp 98.2°F | Resp 12 | Ht 68.0 in | Wt 124.0 lb

## 2019-06-17 DIAGNOSIS — K2950 Unspecified chronic gastritis without bleeding: Secondary | ICD-10-CM

## 2019-06-17 DIAGNOSIS — K259 Gastric ulcer, unspecified as acute or chronic, without hemorrhage or perforation: Secondary | ICD-10-CM

## 2019-06-17 DIAGNOSIS — K2900 Acute gastritis without bleeding: Secondary | ICD-10-CM | POA: Diagnosis not present

## 2019-06-17 DIAGNOSIS — R748 Abnormal levels of other serum enzymes: Secondary | ICD-10-CM

## 2019-06-17 DIAGNOSIS — K449 Diaphragmatic hernia without obstruction or gangrene: Secondary | ICD-10-CM | POA: Diagnosis not present

## 2019-06-17 DIAGNOSIS — K7460 Unspecified cirrhosis of liver: Secondary | ICD-10-CM | POA: Diagnosis not present

## 2019-06-17 MED ORDER — FAMOTIDINE 20 MG PO TABS: 20.0000 mg | ORAL_TABLET | Freq: Two times a day (BID) | ORAL | 2 refills | Status: DC

## 2019-06-17 MED ORDER — SODIUM CHLORIDE 0.9 % IV SOLN: 500.0000 mL | Freq: Once | INTRAVENOUS | Status: DC

## 2019-06-17 MED FILL — FAMOTIDINE 20 MG TABS: 20 | 30 days supply | Qty: 60 | Fill #0

## 2019-06-17 NOTE — Op Note (Signed)
[facility] Patient Name: [name] [name] Procedure Date: 06/17/2019 9:51 AM MRN: [id number] Endoscopist: Thornton Park MD, MD Age: 58 Referring MD:  Date of Birth: 06/12/1961 Gender: Female Account #: [id number] Procedure:                Upper GI endoscopy Indications:              Follow-up of acute gastric ulcer Medicines:                Monitored Anesthesia Care Procedure:                Pre-Anesthesia Assessment:                           - Prior to the procedure, a History and Physical                            was performed, and patient medications and                            allergies were reviewed. The patient's tolerance of                            previous anesthesia was also reviewed. The risks                            and benefits of the procedure and the sedation                            options and risks were discussed with the patient.                            All questions were answered, and informed consent                            was obtained. Prior Anticoagulants: The patient has                            taken no previous anticoagulant or antiplatelet                            agents. ASA Grade Assessment: III - A patient with                            severe systemic disease. After reviewing the risks                            and benefits, the patient was deemed in                            satisfactory condition to undergo the procedure.                           After obtaining informed consent, the endoscope was  passed under direct vision. Throughout the                            procedure, the patient's blood pressure, pulse, and                            oxygen saturations were monitored continuously. The                            Endoscope was introduced through the mouth, and                            advanced to the third part of duodenum. The upper                            GI endoscopy was  accomplished without difficulty.                            The patient tolerated the procedure well. Scope In: Scope Out: Findings:                 The examined esophagus was normal except for a                            subtle ringed appearance throughout the                            mid-esophagus. Biopsies were not obtained as this                            was biopsied on the last EGD. No esophageal varices.                           One non-bleeding cratered gastric ulcer with no                            stigmata of bleeding was found in the gastric                            antrum. The lesion was 10 mm in largest dimension.                            It is smaller than at the time of diagnosis, but,                            not as healed as I would expect. Here was present.                            Biopsies were taken from the ulcer edge with a cold                            forceps for histology. Estimated blood loss was  minimal.                           A small hiatal hernia was present. No portal                            hypertensive gastropathy. No gastric varices.                           The examined duodenum was normal.                           The cardia and gastric fundus were normal on                            retroflexion.                           The exam was otherwise without abnormality. Complications:            No immediate complications. Estimated blood loss:                            Minimal. Estimated Blood Loss:     Estimated blood loss was minimal. Impression:               - Normal esophagus.                           - Non-bleeding gastric ulcer with no stigmata of                            bleeding. Biopsied.                           - Small hiatal hernia.                           - Normal examined duodenum.                           - The examination was otherwise normal. Recommendation:           -  Patient has a contact number available for                            emergencies. The signs and symptoms of potential                            delayed complications were discussed with the                            patient. Return to normal activities tomorrow.                            Written discharge instructions were provided to the  patient.                           - Resume previous diet.                           - Continue present medications. Continue                            pantoprazole 40 mg BID.                           - Add famotidine 20 mg BID x 12 weeks.                           - Await pathology results.                           - No aspirin, ibuprofen, naproxen, or other                            non-steroidal anti-inflammatory drugs.                           - Repeat upper endoscopy in 12 weeks to evaluate                            the response to therapy. Thornton Park MD, MD 06/17/2019 10:08:34 AM This report has been signed electronically.

## 2019-06-17 NOTE — Progress Notes (Signed)
Called to room to assist during endoscopic procedure.  Patient ID and intended procedure confirmed with present staff. Received instructions for my participation in the procedure from the performing physician.  

## 2019-06-17 NOTE — Progress Notes (Signed)
Report to PACU, RN, vss, BBS= Clear.  

## 2019-06-17 NOTE — Progress Notes (Signed)
Temp by Seward Carol by DT

## 2019-06-17 NOTE — Patient Instructions (Signed)
Handout given for hiatal hernia.  Start taking your new medicine, Famotidine 20 mg twice a day for 12 weeks. Continue Pantoprazole 40 mg twice a day.  Do not take any aspirin, ibuprofen, naproxen or other NSAIDS .  You may use Tylenol if needed.  Repeat Endoscopy in 12 weeks to evaluate response to therapy.  YOU HAD AN ENDOSCOPIC PROCEDURE TODAY AT [facility]:   Refer to the procedure report that was given to you for any specific questions about what was found during the examination.  If the procedure report does not answer your questions, please call your gastroenterologist to clarify.  If you requested that your care partner not be given the details of your procedure findings, then the procedure report has been included in a sealed envelope for you to review at your convenience later.  YOU SHOULD EXPECT: Some feelings of bloating in the abdomen. Passage of more gas than usual.  Walking can help get rid of the air that was put into your GI tract during the procedure and reduce the bloating. If you had a lower endoscopy (such as a colonoscopy or flexible sigmoidoscopy) you may notice spotting of blood in your stool or on the toilet paper. If you underwent a bowel prep for your procedure, you may not have a normal bowel movement for a few days.  Please Note:  You might notice some irritation and congestion in your nose or some drainage.  This is from the oxygen used during your procedure.  There is no need for concern and it should clear up in a day or so.  SYMPTOMS TO REPORT IMMEDIATELY:   Following upper endoscopy (EGD)  Vomiting of blood or coffee ground material  New chest pain or pain under the shoulder blades  Painful or persistently difficult swallowing  New shortness of breath  Fever of 100F or higher  Black, tarry-looking stools  For urgent or emergent issues, a gastroenterologist can be reached at any hour by calling [phone].   DIET:  We do recommend a  small meal at first, but then you may proceed to your regular diet.  Drink plenty of fluids but you should avoid alcoholic beverages for 24 hours.  ACTIVITY:  You should plan to take it easy for the rest of today and you should NOT DRIVE or use heavy machinery until tomorrow (because of the sedation medicines used during the test).    FOLLOW UP: Our staff will call the number listed on your records 48-72 hours following your procedure to check on you and address any questions or concerns that you may have regarding the information given to you following your procedure. If we do not reach you, we will leave a message.  We will attempt to reach you two times.  During this call, we will ask if you have developed any symptoms of COVID 19. If you develop any symptoms (ie: fever, flu-like symptoms, shortness of breath, cough etc.) before then, please call [phone].  If you test positive for Covid 19 in the 2 weeks post procedure, please call and report this information to [place].    If any biopsies were taken you will be contacted by phone or by letter within the next 1-3 weeks.  Please call us at [phone] if you have not heard about the biopsies in 3 weeks.    SIGNATURES/CONFIDENTIALITY: You and/or your care partner have signed paperwork which will be entered into your electronic medical record.  These signatures attest  to the fact that that the information above on your After Visit Summary has been reviewed and is understood.  Full responsibility of the confidentiality of this discharge information lies with you and/or your care-partner.

## 2019-06-22 ENCOUNTER — Telehealth: Payer: Self-pay | Admitting: *Deleted

## 2019-06-22 ENCOUNTER — Telehealth: Payer: Self-pay

## 2019-06-22 NOTE — Telephone Encounter (Signed)
Attempted f/u phone call. No answer. Left message. °

## 2019-06-22 NOTE — Telephone Encounter (Signed)
Left message on 2nd follow up call. 

## 2019-06-28 ENCOUNTER — Other Ambulatory Visit: Payer: Self-pay

## 2019-06-28 ENCOUNTER — Ambulatory Visit: Payer: Medicare Other | Attending: Internal Medicine | Admitting: Internal Medicine

## 2019-06-28 DIAGNOSIS — K5190 Ulcerative colitis, unspecified, without complications: Secondary | ICD-10-CM | POA: Diagnosis not present

## 2019-06-28 DIAGNOSIS — K743 Primary biliary cirrhosis: Secondary | ICD-10-CM | POA: Diagnosis not present

## 2019-06-28 DIAGNOSIS — K279 Peptic ulcer, site unspecified, unspecified as acute or chronic, without hemorrhage or perforation: Secondary | ICD-10-CM | POA: Insufficient documentation

## 2019-06-28 DIAGNOSIS — Z9889 Other specified postprocedural states: Secondary | ICD-10-CM | POA: Insufficient documentation

## 2019-06-28 DIAGNOSIS — E785 Hyperlipidemia, unspecified: Secondary | ICD-10-CM

## 2019-06-28 DIAGNOSIS — I10 Essential (primary) hypertension: Secondary | ICD-10-CM

## 2019-06-28 DIAGNOSIS — B20 Human immunodeficiency virus [HIV] disease: Secondary | ICD-10-CM

## 2019-06-28 MED ORDER — ATORVASTATIN CALCIUM 10 MG PO TABS: 10.0000 mg | ORAL_TABLET | Freq: Every day | ORAL | 6 refills | Status: DC

## 2019-06-28 MED ORDER — LISINOPRIL 5 MG PO TABS: 5.0000 mg | ORAL_TABLET | Freq: Every day | ORAL | 3 refills | Status: DC

## 2019-06-28 MED ORDER — AMLODIPINE BESYLATE 10 MG PO TABS: ORAL_TABLET | ORAL | 2 refills | Status: DC

## 2019-06-28 MED FILL — LISINOPRIL 5 MG TABS: 5 | 90 days supply | Qty: 90 | Fill #0

## 2019-06-28 NOTE — Telephone Encounter (Signed)
Opened in error

## 2019-06-28 NOTE — Progress Notes (Signed)
Virtual Visit via Telephone Note Due to current restrictions/limitations of in-office visits due to the COVID-19 pandemic, this scheduled clinical appointment was converted to a telehealth visit  I connected with [name] [name] on 06/28/19 at 11:22 a.m by telephone and verified that I am speaking with the correct person using two identifiers. I am in my office.  The patient is at home.  Only the patient and myself participated in this encounter.  I discussed the limitations, risks, security and privacy concerns of performing an evaluation and management service by telephone and the availability of in person appointments. I also discussed with the patient that there may be a patient responsible charge related to this service. The patient expressed understanding and agreed to proceed.   History of Present Illness: Patient with history of HTN, HL, IDA, GERD,LGSILwith positive HPV(neg colpo 07/10/2017),formertobacco, HIV, ulcerative colitis and calf cramps.  Patient was last evaluated 3 months ago.  Purpose of today's visit is chronic disease management.  PBC with autoimmune hepatitis:  Followed by GI Dr. Tarri Glenn.  Had EGD done 03/2019 this revealed nonbleeding ulcer in the antrum of the stomach.  H.pylori negative.  On Protonix.  Repeat EGD done the end of last month.  This revealed that the ulcer was small but had not completely healed.  She continues on Protonix. For the PBC/autoimmune hepatitis, she is currently on budesonide and ursodiol.  In regards to the ulcerative colitis, colonoscopy revealed no signs of active flareup or inflammation.  She remains on sulfasalazine.  Abn PAP:  Had  colpo by Dr. Rip Harbour 04/2019 due to Pap showing LGSIL with HPV.  This colpo revealed LGSIL.  He spoke with GYN/Onc.  They recommend repeat Pap smear with HPV in 1 year.  HTN: compliant with Lisinopril and Norvasc.  Checks BP QOB.  Most recent was 130/50.  Limits salt in foods.  No CP/SOB/LE/HA/dizziness  HL:  LDL  cholesterol in 03/2019 was 155 which was higher than in 08/2018.  Reports around that time she was out of Lipitor but now taking and tolerating Lipitor  HyperCa:  Repeat Ca level was nl.  HIV: Follows up with her infectious disease specialist Dr. Drucilla Schmidt regularly.  Last seen in October.  Last RNA quant was not detectable.  Reports compliance with medication.  Outpatient Encounter Medications as of 06/28/2019  Medication Sig  . amLODipine (NORVASC) 10 MG tablet TAKE 1 TABLET BY MOUTH DAILY  . atorvastatin (LIPITOR) 10 MG tablet Take 1 tablet (10 mg total) by mouth daily.  . bictegravir-emtricitabine-tenofovir AF (BIKTARVY) 50-200-25 MG TABS tablet Take 1 tablet by mouth daily.  . budesonide (ENTOCORT EC) 3 MG 24 hr capsule Take 3 capsules (9 mg total) by mouth daily.  . famotidine (PEPCID) 20 MG tablet Take 1 tablet (20 mg total) by mouth 2 (two) times daily.  . FEROSUL 325 (65 Fe) MG tablet TAKE 1 TABLET BY MOUTH DAILY WITH BREAKFAST.  . hyoscyamine (LEVSIN, ANASPAZ) 0.125 MG tablet Take 1 tablet (0.125 mg total) by mouth every 6 (six) hours as needed.  Marland Kitchen lisinopril (ZESTRIL) 5 MG tablet TAKE 1 TABLET BY MOUTH DAILY.  . pantoprazole (PROTONIX) 40 MG tablet Take 1 tablet (40 mg total) by mouth 2 (two) times daily before a meal.  . sulfaSALAzine (AZULFIDINE) 500 MG tablet TAKE 2 TABLETS (1,000 MG TOTAL) BY MOUTH 2 (TWO) TIMES DAILY.  . ursodiol (ACTIGALL) 250 MG tablet Take 1 tablet (250 mg total) by mouth 2 (two) times daily.  . feeding supplement, ENSURE COMPLETE, (ENSURE  COMPLETE) LIQD Take 237 mLs by mouth 3 (three) times daily between meals.   No facility-administered encounter medications on file as of 06/28/2019.      Observations/Objective: Results for orders placed or performed in visit on 06/15/19  SARS Coronavirus 2 (TAT 6-24 hrs)  Result Value Ref Range   SARS Coronavirus 2 RESULT:  NEGATIVE      Assessment and Plan: 1. Essential hypertension Home blood pressure reading at  goal.  She will continue lisinopril, Norvasc and low-salt diet. - lisinopril (ZESTRIL) 5 MG tablet; Take 1 tablet (5 mg total) by mouth daily.  Dispense: 90 tablet; Refill: 3 - amLODipine (NORVASC) 10 MG tablet; TAKE 1 TABLET BY MOUTH DAILY  Dispense: 90 tablet; Refill: 2  2. Hyperlipidemia, unspecified hyperlipidemia type -Most recent lipid profile not at goal.  Patient reports she was out of medicine at the time.  She is not taking medication consistently.  Dietary counseling given - atorvastatin (LIPITOR) 10 MG tablet; Take 1 tablet (10 mg total) by mouth daily.  Dispense: 90 tablet; Refill: 6  3. Primary biliary cirrhosis ([place]) Followed by gastroenterology  4. HIV disease ([place]) Followed by ID.  HIV RNA quant is nondetectable  5. History of colposcopy with cervical biopsy Plan for repeat Pap with HPV in 1 year  6. Peptic ulcer disease On Protonix.  Advised to avoid NSAIDs  7. Ulcerative colitis without complications, unspecified location ([place]) Clinically stable.   Follow Up Instructions: 3-4 mths   I discussed the assessment and treatment plan with the patient. The patient was provided an opportunity to ask questions and all were answered. The patient agreed with the plan and demonstrated an understanding of the instructions.   The patient was advised to call back or seek an in-person evaluation if the symptoms worsen or if the condition fails to improve as anticipated.  I provided 11 minutes of non-face-to-face time during this encounter.   Karle Plumber, MD

## 2019-07-07 ENCOUNTER — Other Ambulatory Visit: Payer: Self-pay | Admitting: Infectious Disease

## 2019-07-07 ENCOUNTER — Encounter: Payer: Self-pay | Admitting: *Deleted

## 2019-07-07 ENCOUNTER — Other Ambulatory Visit: Payer: Self-pay | Admitting: *Deleted

## 2019-07-07 DIAGNOSIS — K259 Gastric ulcer, unspecified as acute or chronic, without hemorrhage or perforation: Secondary | ICD-10-CM

## 2019-07-07 DIAGNOSIS — Z01818 Encounter for other preprocedural examination: Secondary | ICD-10-CM

## 2019-07-12 MED FILL — BIKTARVY 50-200-25 MG TABS: 50-200-25 | 30 days supply | Qty: 30 | Fill #0

## 2019-07-16 ENCOUNTER — Telehealth: Payer: Self-pay | Admitting: *Deleted

## 2019-07-16 NOTE — Telephone Encounter (Signed)
Left message confirmed EGD appt times and dates.

## 2019-08-10 MED FILL — BIKTARVY 50-200-25 MG TABS: 50-200-25 | 30 days supply | Qty: 30 | Fill #1

## 2019-08-12 ENCOUNTER — Other Ambulatory Visit: Payer: Self-pay | Admitting: Gastroenterology

## 2019-08-12 ENCOUNTER — Telehealth: Payer: Self-pay | Admitting: Internal Medicine

## 2019-08-12 DIAGNOSIS — K5190 Ulcerative colitis, unspecified, without complications: Secondary | ICD-10-CM

## 2019-08-12 MED FILL — ATORVASTATIN 10 MG TABLET: 10 | 90 days supply | Qty: 90 | Fill #0

## 2019-08-12 MED FILL — PANTOPRAZOLE SOD DR 40 MG T: 40 | 30 days supply | Qty: 30 | Fill #2

## 2019-08-12 MED FILL — FAMOTIDINE 20 MG TABLET: 20 | 30 days supply | Qty: 60 | Fill #1

## 2019-08-12 MED FILL — AMLODIPINE BESYLATE 10 MG T: 10 | 90 days supply | Qty: 90 | Fill #0

## 2019-08-12 MED FILL — BUDESONIDE 3 MG CAP: 3 | 90 days supply | Qty: 270 | Fill #0

## 2019-08-12 NOTE — Telephone Encounter (Signed)
Therapeutic switch will have to made by PCP. Will route to her.

## 2019-08-12 NOTE — Telephone Encounter (Signed)
Okay to refill. But, she is due office follow-up. Thank you.

## 2019-08-12 NOTE — Telephone Encounter (Signed)
sulfaSALAzine (AZULFIDINE) 500 MG tablet [[id number]]   Pharmacist Lutricia Horsfall called states medication is on back order for a couple months.  Please call pharmacy at [phone].

## 2019-08-13 NOTE — Telephone Encounter (Signed)
Patient requested for the SULFASALAZINE 500MG to be sent to [facility].

## 2019-08-17 MED ORDER — SULFASALAZINE 500 MG PO TABS: 1000.0000 mg | ORAL_TABLET | Freq: Two times a day (BID) | ORAL | 3 refills | Status: DC

## 2019-08-17 NOTE — Telephone Encounter (Signed)
Will forward to pcp

## 2019-08-17 NOTE — Telephone Encounter (Signed)
We do carry the 500 mg oral tablets.

## 2019-08-17 NOTE — Addendum Note (Signed)
Addended by: Karle Plumber B on: 08/17/2019 06:03 PM   Modules accepted: Orders

## 2019-09-02 ENCOUNTER — Other Ambulatory Visit: Payer: Self-pay | Admitting: Gastroenterology

## 2019-09-09 ENCOUNTER — Encounter: Payer: Medicare Other | Admitting: Gastroenterology

## 2019-09-09 MED FILL — FAMOTIDINE 20 MG TABLET: 20 | 30 days supply | Qty: 60 | Fill #2

## 2019-09-09 MED FILL — BIKTARVY 50-200-25 MG TABS: 50-200-25 | 30 days supply | Qty: 30 | Fill #2

## 2019-09-09 MED FILL — PANTOPRAZOLE SOD DR 40 MG T: 40 | 30 days supply | Qty: 30 | Fill #3

## 2019-09-13 ENCOUNTER — Other Ambulatory Visit: Payer: Self-pay | Admitting: Gastroenterology

## 2019-09-13 MED FILL — URSODIOL 250 MG TABLET: 250 | 90 days supply | Qty: 180 | Fill #0

## 2019-09-20 ENCOUNTER — Telehealth: Payer: Self-pay | Admitting: Gastroenterology

## 2019-09-20 NOTE — Telephone Encounter (Signed)
Pt called and stated that her Yolanda Bonine was DX with Covid.  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  Cancelled EGD for 09-22-2019 and rescheduled for 10-13-19

## 2019-09-20 NOTE — Telephone Encounter (Signed)
Noted. Thanks.

## 2019-09-22 ENCOUNTER — Encounter: Payer: Medicare Other | Admitting: Gastroenterology

## 2019-09-27 ENCOUNTER — Ambulatory Visit: Payer: Medicare Other | Attending: Internal Medicine | Admitting: Internal Medicine

## 2019-09-27 ENCOUNTER — Other Ambulatory Visit: Payer: Self-pay

## 2019-10-07 MED FILL — LISINOPRIL 5 MG TABLET: 5 | 90 days supply | Qty: 90 | Fill #1

## 2019-10-07 MED FILL — BIKTARVY 50-200-25 MG TABS: 50-200-25 | 30 days supply | Qty: 30 | Fill #3

## 2019-10-11 ENCOUNTER — Other Ambulatory Visit: Payer: Self-pay

## 2019-10-11 ENCOUNTER — Ambulatory Visit (INDEPENDENT_AMBULATORY_CARE_PROVIDER_SITE_OTHER): Payer: Medicare Other

## 2019-10-11 DIAGNOSIS — Z1159 Encounter for screening for other viral diseases: Secondary | ICD-10-CM

## 2019-10-13 ENCOUNTER — Ambulatory Visit (AMBULATORY_SURGERY_CENTER): Payer: Medicare Other | Admitting: Gastroenterology

## 2019-10-13 ENCOUNTER — Other Ambulatory Visit: Payer: Self-pay

## 2019-10-13 ENCOUNTER — Encounter: Payer: Self-pay | Admitting: Gastroenterology

## 2019-10-13 VITALS — BP 158/91 | HR 69 | Temp 97.1°F | Resp 12 | Ht 68.0 in | Wt 124.0 lb

## 2019-10-13 DIAGNOSIS — Z1211 Encounter for screening for malignant neoplasm of colon: Secondary | ICD-10-CM | POA: Diagnosis not present

## 2019-10-13 DIAGNOSIS — K279 Peptic ulcer, site unspecified, unspecified as acute or chronic, without hemorrhage or perforation: Secondary | ICD-10-CM

## 2019-10-13 DIAGNOSIS — K259 Gastric ulcer, unspecified as acute or chronic, without hemorrhage or perforation: Secondary | ICD-10-CM

## 2019-10-13 DIAGNOSIS — K3189 Other diseases of stomach and duodenum: Secondary | ICD-10-CM

## 2019-10-13 MED ORDER — SODIUM CHLORIDE 0.9 % IV SOLN: 500.0000 mL | Freq: Once | INTRAVENOUS | Status: DC

## 2019-10-13 NOTE — Op Note (Signed)
[place] Patient Name: [name] [name] Procedure Date: 10/13/2019 9:42 AM MRN: [id number] Endoscopist: Thornton Park MD, MD Age: 59 Referring MD:  Date of Birth: 1961/05/16 Gender: Female Account #: [id number] Procedure:                Upper GI endoscopy Indications:              Follow-up of chronic gastric ulcer Medicines:                Monitored Anesthesia Care Procedure:                Pre-Anesthesia Assessment:                           - Prior to the procedure, a History and Physical                            was performed, and patient medications and                            allergies were reviewed. The patient's tolerance of                            previous anesthesia was also reviewed. The risks                            and benefits of the procedure and the sedation                            options and risks were discussed with the patient.                            All questions were answered, and informed consent                            was obtained. Prior Anticoagulants: The patient has                            taken no previous anticoagulant or antiplatelet                            agents. ASA Grade Assessment: III - A patient with                            severe systemic disease. After reviewing the risks                            and benefits, the patient was deemed in                            satisfactory condition to undergo the procedure.                           After obtaining informed consent, the endoscope was  passed under direct vision. Throughout the                            procedure, the patient's blood pressure, pulse, and                            oxygen saturations were monitored continuously. The                            Endoscope was introduced through the mouth, and                            advanced to the third part of duodenum. The upper                            GI endoscopy was  accomplished without difficulty.                            The patient tolerated the procedure well. Scope In: Scope Out: Findings:                 The examined esophagus was normal.                           Two non-bleeding cratered gastric ulcers with no                            stigmata of bleeding were found in the gastric                            antrum. The largest lesion was 15 mm in largest                            dimension (it was not photographed prior to                            biopsies). The smaller ulcer was 63m. Biopsies                            were taken with a cold forceps for histology from                            the ulcer beds and margins. Estimated blood loss                            was minimal.                           Multiple dispersed small erosions with no bleeding                            and no stigmata of recent bleeding were found in  the gastric body. Biopsies were taken with a cold                            forceps for histology. Estimated blood loss was                            minimal.                           A small hiatal hernia was present.                           The examined duodenum was normal.                           The cardia and gastric fundus were normal on                            retroflexion.                           The exam was otherwise without abnormality. Complications:            No immediate complications. Estimated blood loss:                            Minimal. Estimated Blood Loss:     Estimated blood loss was minimal. Impression:               - Normal esophagus.                           - Non-bleeding gastric ulcers with no stigmata of                            bleeding. These have progressed since her endoscopy                            in December 2020. Biopsied.                           - Erosive gastropathy with no bleeding and no                             stigmata of recent bleeding. Biopsied.                           - Small hiatal hernia.                           - Normal examined duodenum.                           - The examination was otherwise normal. Recommendation:           - Patient has a contact number available for  emergencies. The signs and symptoms of potential                            delayed complications were discussed with the                            patient. Return to normal activities tomorrow.                            Written discharge instructions were provided to the                            patient.                           - Resume previous diet.                           - Continue present medications including                            pantoprazole 40 mg BID, famotidine 20 mg BID.                           - No aspirin, ibuprofen, naproxen, or other                            non-steroidal anti-inflammatory drugs.                           - Await pathology results.                           - Return to my office to review these results. Thornton Park MD, MD 10/13/2019 10:04:41 AM This report has been signed electronically.

## 2019-10-13 NOTE — Progress Notes (Signed)
Temp-LC VS-DT

## 2019-10-13 NOTE — Progress Notes (Signed)
No problems noted in the recovery room. Maw  Pt already has pantoprazole 40 mg BID and Famotidine 20 mg BID.  IPt said she is still taking both rx.  I encouraged pt to continue to take both rx. maw

## 2019-10-13 NOTE — Patient Instructions (Addendum)
YOU HAD AN ENDOSCOPIC PROCEDURE TODAY AT [facility]:   Refer to the procedure report that was given to you for any specific questions about what was found during the examination.  If the procedure report does not answer your questions, please call your gastroenterologist to clarify.  If you requested that your care partner not be given the details of your procedure findings, then the procedure report has been included in a sealed envelope for you to review at your convenience later.  YOU SHOULD EXPECT: Some feelings of bloating in the abdomen. Passage of more gas than usual.  Walking can help get rid of the air that was put into your GI tract during the procedure and reduce the bloating. If you had a lower endoscopy (such as a colonoscopy or flexible sigmoidoscopy) you may notice spotting of blood in your stool or on the toilet paper. If you underwent a bowel prep for your procedure, you may not have a normal bowel movement for a few days.  Please Note:  You might notice some irritation and congestion in your nose or some drainage.  This is from the oxygen used during your procedure.  There is no need for concern and it should clear up in a day or so.  SYMPTOMS TO REPORT IMMEDIATELY:   Following upper endoscopy (EGD)  Vomiting of blood or coffee ground material  New chest pain or pain under the shoulder blades  Painful or persistently difficult swallowing  New shortness of breath  Fever of 100F or higher  Black, tarry-looking stools  For urgent or emergent issues, a gastroenterologist can be reached at any hour by calling [phone]. Do not use MyChart messaging for urgent concerns.    DIET:  We do recommend a small meal at first, but then you may proceed to your regular diet.  Drink plenty of fluids but you should avoid alcoholic beverages for 24 hours.  ACTIVITY:  You should plan to take it easy for the rest of today and you should NOT DRIVE or use heavy machinery  until tomorrow (because of the sedation medicines used during the test).    FOLLOW UP: Our staff will call the number listed on your records 48-72 hours following your procedure to check on you and address any questions or concerns that you may have regarding the information given to you following your procedure. If we do not reach you, we will leave a message.  We will attempt to reach you two times.  During this call, we will ask if you have developed any symptoms of COVID 19. If you develop any symptoms (ie: fever, flu-like symptoms, shortness of breath, cough etc.) before then, please call [phone].  If you test positive for Covid 19 in the 2 weeks post procedure, please call and report this information to [place].    If any biopsies were taken you will be contacted by phone or by letter within the next 1-3 weeks.  Please call us at [phone] if you have not heard about the biopsies in 3 weeks.    SIGNATURES/CONFIDENTIALITY: You and/or your care partner have signed paperwork which will be entered into your electronic medical record.  These signatures attest to the fact that that the information above on your After Visit Summary has been reviewed and is understood.  Full responsibility of the confidentiality of this discharge information lies with you and/or your care-partner.     Handout was given to you hiatal hernia. Continue present medications  including pantoprazole 40 mg 2 x per day and famotidine 20 mg 2 x day.  You already have these medications on your list. NO ASPIRIN, ASPIRIN CONTAINING PRODUCTS (BC OR GOODY POWDERS) OR NSAIDS (IBUPROFEN, ADVIL, ALEVE, AND MOTRIN). You may resume your current medications today. Await biopsy results. The office will call you to set up an appointment to review results of biopsies. Please call if any questions or concerns.

## 2019-10-13 NOTE — Progress Notes (Signed)
To PACU, VSS. Report to [email or web address] 

## 2019-10-13 NOTE — Progress Notes (Signed)
Called to room to assist during endoscopic procedure.  Patient ID and intended procedure confirmed with present staff. Received instructions for my participation in the procedure from the performing physician.  

## 2019-10-14 LAB — SARS CORONAVIRUS 2 (TAT 6-24 HRS): SARS Coronavirus 2: NEGATIVE

## 2019-10-15 ENCOUNTER — Telehealth: Payer: Self-pay

## 2019-10-15 NOTE — Telephone Encounter (Signed)
Left message on follow up call. 

## 2019-10-15 NOTE — Telephone Encounter (Signed)
  Follow up Call-  Call back number 10/13/2019 06/17/2019 03/31/2019  Post procedure Call Back phone  # [phone]  Permission to leave phone message Yes Yes Yes  Some recent data might be hidden     Patient questions:  Do you have a fever, pain , or abdominal swelling? No. Pain Score  0 *  Have you tolerated food without any problems? Yes.    Have you been able to return to your normal activities? Yes.    Do you have any questions about your discharge instructions: Diet   No. Medications  No. Follow up visit  No.  Do you have questions or concerns about your Care? No.  Actions: * If pain score is 4 or above: No action needed, pain <4.  1. Have you developed a fever since your procedure? no  2.   Have you had an respiratory symptoms (SOB or cough) since your procedure? no  3.   Have you tested positive for COVID 19 since your procedure no  4.   Have you had any family members/close contacts diagnosed with the COVID 19 since your procedure?  no   If yes to any of these questions please route to Joylene John, RN and Erenest Rasher, RN

## 2019-10-29 ENCOUNTER — Other Ambulatory Visit: Payer: Self-pay | Admitting: Gastroenterology

## 2019-11-02 ENCOUNTER — Ambulatory Visit (INDEPENDENT_AMBULATORY_CARE_PROVIDER_SITE_OTHER): Payer: Medicare Other | Admitting: Gastroenterology

## 2019-11-02 ENCOUNTER — Encounter: Payer: Self-pay | Admitting: Gastroenterology

## 2019-11-02 ENCOUNTER — Other Ambulatory Visit (INDEPENDENT_AMBULATORY_CARE_PROVIDER_SITE_OTHER): Payer: Medicare Other

## 2019-11-02 VITALS — BP 130/82 | HR 73 | Ht 68.0 in | Wt 128.6 lb

## 2019-11-02 DIAGNOSIS — K259 Gastric ulcer, unspecified as acute or chronic, without hemorrhage or perforation: Secondary | ICD-10-CM

## 2019-11-02 DIAGNOSIS — K743 Primary biliary cirrhosis: Secondary | ICD-10-CM

## 2019-11-02 DIAGNOSIS — K754 Autoimmune hepatitis: Secondary | ICD-10-CM

## 2019-11-02 LAB — COMPREHENSIVE METABOLIC PANEL
ALT: 18 U/L (ref 0–35)
AST: 23 U/L (ref 0–37)
Albumin: 4.5 g/dL (ref 3.5–5.2)
Alkaline Phosphatase: 202 U/L — ABNORMAL HIGH (ref 39–117)
BUN: 13 mg/dL (ref 6–23)
CO2: 30 mEq/L (ref 19–32)
Calcium: 9.5 mg/dL (ref 8.4–10.5)
Chloride: 106 mEq/L (ref 96–112)
Creatinine, Ser: 0.78 mg/dL (ref 0.40–1.20)
GFR: 90.94 mL/min (ref >60.00)
Glucose, Bld: 104 mg/dL — ABNORMAL HIGH (ref 70–99)
Potassium: 3.7 mEq/L (ref 3.5–5.1)
Sodium: 140 mEq/L (ref 135–145)
Total Bilirubin: 0.6 mg/dL (ref 0.2–1.2)
Total Protein: 8 g/dL (ref 6.0–8.3)

## 2019-11-02 NOTE — Patient Instructions (Addendum)
I have not changed any of your medications today.  Please go to the lab this morning.  I ordered studies to follow-up on your response to the treatment for your liver disease and to be gastrin level to follow-up on your ulcers.  It is time for another MRI.  We will compare these results to your MRI from December 2019 and September 2020.  Thank you for putting your trust in me.  Let's plan another office visit in 2 to 3 months.  Please call me with any questions or concerns prior to that time.  Your provider has requested that you go to the basement level for lab work before leaving today. Press "B" on the elevator. The lab is located at the first door on the left as you exit the elevator.

## 2019-11-02 NOTE — Progress Notes (Signed)
Referring Provider: Ladell Pier, MD Primary Care Physician:  Ladell Pier, MD  Chief complaint: PBC   IMPRESSION:  PBC with autoimmune hepatitis overlap with abnormal liver enzymes    -  Alk phos elevated dating back to 2015      - magnitude of elevation has increased in the last couple of years.          - Transaminases are mildly elevated. Total bilirubin is normal.     - Liver biopsy 04/20/14: chronic portal hepatitis pattern of injury with marked duct injury c/w PBC    - 2015: Stage 2-3 PBC of 4. Reviewed at [facility] 03/01/2019: ANA 1:1280, IgG 1793, AMA 215.5, IgM 121    - Liver biopsy 03/15/2019 showed 2-3 fibrosis    - Abdominal imaging has suggested cirrhosis    - FIB-4 1.56 and APRI 0.450 do not support a diagnosis of cirrhosis Persistent gastric antral ulcer on upper endoscopy 03/31/19, 12/20, 4/21    - biopsies negative for H pylori Intestinal metaplasia on EGD 4/21 GERD with a small hiatal hernia Quiescent ulcerative colitis    - Random colon biopsies 12/16/2007: diffuse active chronic colitis, possibly UC    - Random colon biopsies 12/23/13: diffuse chronic active colitis compatible with idiopathic IBD    - Abnormal CT scan 04/2014: thickened left colon    - diagnosed with UC while hospitalized in [facility] 05/2014    - Colonoscopy 03/31/2019 shows pancolonic pseudopolyps but no active colitis Abnormal ultrasound and liver MRI    - CT a/p with contrast at [facility] 11/13/17:        - enteritis, mild gastritis, mild hepatomegaly, liver lesions    - ultrasound 05/08/18: 1.3 cm solid mass in the left liver lobe       - 0.6 cm irregular hypoechoic mass in the right liver lobe       - 1.2 cm benign appearing cyst in the right liver lobe    - MRI 05/30/18: suspected cirrhosis       - 5 small lesion in the liver, likely small cysts    - MRI/MRCP 03/17/2019 showed cirrhosis without liver mass.  Spleen was normal.  No ascites. HIV on BIKTARVY  Family  history of stomach cancer (Father)  Advanced liver disease: Labs today to monitor response to ursodeoxycholic acid and budesonide for PBC with autoimmune hepatitis overlap.  There is some fibrosis but no evidence on biopsy for advanced fibrosis.   Persistent gastric ulcer: She has completed several months of twice daily proton pump inhibitor therapy.  Follow-up EGD recommended to document healing of her unexplained gastric ulcer in 3 months and exclude concurrent etiologies such as syphilis, TB, CMV, IgG4 sclerosing disease.  She denied the use of NSAIDs. Will check gastrin level on PPI therapy.    Intestinal metaplasia with family history of gastric cancer (father): Needs gastric mapping for intestinal metaplasia. She is at high risk for gastric cancer.   Ulcerative colitis: Quiescent. No symptoms suggestive of active disease.  Abnormal MRI: Given her prior abnormal MRI and suspected underlying advanced fibrosis/early cirrhosis, will need high quality imaging every 6 months given the risk of liver cancer. Will trend AFP levels.    PLAN: - Continue to avoid all NSAIDs - Continue UDCA at a total daily dose of 13 to 15 mg/kg for 250 mg 2 tablets BID - Continue Budesonde 9 mg daily - Continue pantoprazole 40 mg twice daily - Add famotidine  20 mg BID - CMP, IgG to monitor response to treatment - Gastrin level - MRI  With [facility] to follow-up on her abnormal MRI 12/19, MRCP 02/2019 - Return to clinic in 2-3 months, earlier if needed - EGD with gastric mapping in 3 months  Please see the "Patient Instructions" section for addition details about the plan.  I spent 45 minutes, including in depth chart review, independent review of results, communicating results with the patient directly, face-to-face time with the patient, coordinating care, ordering studies and medications as appropriate, and documentation.  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 She has HTN, HL, IDA, GERD,LGSILwith positive HPV,formertobacco, HIV on BIKTARVY, cavitary lung lesion. History of substance abuse but has not used any drugs for 3 years. She has completed the Covid-19 vaccine series.   Overall feeling better. Reports 100% adherence with medications.  No reflux, bloating, or diarrhea. No abdominal pain. Reflux has improved on treatment.  Bloating has improved. No diarrhea. She feels like her bowel habits have normalize. Having one formed BM daily. No blood or mucous.  She has no new complaints or concerns at this time.   Recent evaluation has included: - Labs 09/08/18: WBC 4.5, hgb 11.3, platelets 341, MCV 92.2, RDW 15.1 - Labs 03/01/2019: Normal CMP except for a calcium of 10.6, alk phos 532, total bilirubin 0.5, AST 51, ALT 41, albumin 4.6, IgG 1793, IgM 121, INR 1.0, ANA positive at 06-1278, ACE less than 5, AMA to 15.5 - MRI/MRCP 03/17/2019 showed cirrhosis without liver mass.  Spleen was normal.  No ascites. - Liver biopsy was performed 03/15/2019 showing PBC with features of overlapping autoimmune hepatitis.  Stage 2-3 out of 4 fibrosis. - Colonoscopy was performed 03/31/2019 given her history of ulcerative colitis.  She had pancolonic pseudopolyps and hemorrhoids.  Stool in the entire examined colon limited this from being a high quality screening colonoscopy.  Biopsies from the terminal ileum and throughout the colon were completely normal. - EGD 03/31/19 showed a small hiatal hernia, 12 mm cratered gastric antral ulcer.  Esophageal biopsies showed reflux without eosinophilic esophagitis or Barrett's.  She had chronic inactive gastritis without H. pylori.  Duodenal biopsies were normal.  - EGD 06/17/19: 10 mm antral ulcer,  No portal hypertensive gastropathy. No esophageal or gastric varices. Gastric biopsies show active chronic gastritis with ulceration and prominent cytologic atypia, indefinite for dysplasia. There is moderate  cytologic atypia with nuclear enlargement and hyperchromasia. Immunohistochemical stain for p53 shows focal overexpression but a definite clonal overexpression pattern is not seen. The changes are interpreted as indefinite for dysplasia but a reactive change may be favored due to presence of prominent acute inflammation.  - EGD 10/13/19: Two non-bleeding cratered gastric ulcers, the largest 11m, the smaller 124m Multiple antral erosions. Small hiatal hernia. Gastric biopsies with intestinal metaplasia and cytologic atypia. Intestinal metaplasia.   Prior records from her evaluation with Dr. KaDeatra Inan 2016: She has a history of drug abuse, AIDS, hypertension and a cavitary lesion of the lung.  [facility] whether [facility].  A CT scan in November, 2015, demonstrated thickening of the left colon.  She complains of chronic abdominal pain.  Stools are intermittently loose and she has had some limited rectal bleeding.  On May 16, 2014 hemoglobin was 8. CT scan in November, 2015 demonstrated thickening of  the left colon.  C. difficile toxin was negative.  She was apparently hospitalized in [facility] last month and was told she has ulcerative colitis but she can provide no details.  Patient claims have chronic abdominal pain that is not controlled with Tylenol No. 3.  She's asking for "stronger meds". Before embarking on any workup we need to review her prior records.  She has been given pain medications through the clinic here in [place] and I told her that I will not prescribe any further pain medications.  The patient did not follow-up. It does not appear that the records from [facility] were ever located. I am unable to find them in [place].  Prior records from outside: Liver biopsy 04/20/14: chronic portal hepatitis pattern of injury with marked duct injury, consistent with PBC. Stage 2-3 of 4.  Reviewed at [facility].  Random colon biopsies 12/16/2007: diffuse active chronic colitis, possibly UC Random colon biopsies 12/23/13: diffuse chronic active colitis compatible with idiopathic inflammatory bowel disorder;    Prior abdominal imaging:  Ultrasound 05/08/18:  1.3 cm solid appearing mass in the anterior aspect o the left lobe of the liver that has increased in size from 0.6 cm on 11/13/2017. This is concerning for a primary or metastatic neoplasm. A previously demonstrated 2nd possibly solid mass in the periphery of the right lobe of the liver, laterally, could not be visualized today. A 0.6 cm irregular hypoechoic mass in the right lobe of the liver and a 1.2 cm benign appearing cyst in the posterior aspect of the right lobe of the liver are thought to be cysts.  MRI Liver 05/30/18 with [facility]: 5 nonenhancing liver lesions compatible with cysts. One of these is in the left hepatic lobe and is a candidate structure for the lesion seen at ultrasound. At ultrasound the lesion had some faint internal echoes and the possibility of a faintly complex cyst is noted, but the lack of enhancement would seem to argue against malignancy. Scattered peripheral small foci of early arterial phase enhancement in the liver are likely from small vascular malformations. These resolve on later arterial phase and subsequent imaging, without capsule or washout appearance to suggest hepatocellular carcinoma.  Reticular enhancement throughout the liver on arterial phase images favoring diffuse fibrosis and underlying cirrhosis.Aortic Atherosclerosis.  MRI/MRCP 03/17/2019 showed cirrhosis without liver mass.  Spleen was normal.  No ascites.   Past Medical History:  Diagnosis Date  . Arthritis   . Cavities 02/02/2015  . GERD (gastroesophageal reflux disease) Dx 2014  . GI bleed   . HIV (human immunodeficiency virus infection) ([place]) Dx 2015  . Hyperlipidemia   . Hypertension Dx 2014  . Primary biliary  cirrhosis ([place]) 07/03/2018  . Substance abuse ([place])    Quit 2008  . Ulcerative colitis [facility])     Past Surgical History:  Procedure Laterality Date  . ABDOMINAL HYSTERECTOMY  2001    done in [facility], patient unsure of indication  . TEE WITHOUT CARDIOVERSION N/A 05/02/2014   Procedure: TRANSESOPHAGEAL ECHOCARDIOGRAM (TEE);  Surgeon: Larey Dresser, MD;  Location: [place];  Service: Cardiovascular;  Laterality: N/A;  . VIDEO BRONCHOSCOPY Bilateral 04/27/2014   Procedure: VIDEO BRONCHOSCOPY WITH FLUORO;  Surgeon: Rigoberto Noel, MD;  Location: [facility] ENDOSCOPY;  Service: Cardiopulmonary;  Laterality: Bilateral;    Current Outpatient Medications  Medication Sig Dispense Refill  . amLODipine (NORVASC) 10 MG tablet TAKE 1 TABLET BY MOUTH DAILY 90 tablet 2  . atorvastatin (LIPITOR) 10 MG tablet  Take 1 tablet (10 mg total) by mouth daily. 90 tablet 6  . BIKTARVY 50-200-25 MG TABS tablet TAKE 1 TABLET BY MOUTH DAILY. 30 tablet 4  . budesonide (ENTOCORT EC) 3 MG 24 hr capsule TAKE 3 CAPSULES (9 MG TOTAL) BY MOUTH DAILY. 270 capsule 0  . famotidine (PEPCID) 20 MG tablet TAKE 1 TABLET BY MOUTH TWICE DAILY 60 tablet 2  . feeding supplement, ENSURE COMPLETE, (ENSURE COMPLETE) LIQD Take 237 mLs by mouth 3 (three) times daily between meals. 237 mL 0  . FEROSUL 325 (65 Fe) MG tablet TAKE 1 TABLET BY MOUTH DAILY WITH BREAKFAST. 90 tablet 3  . hyoscyamine (LEVSIN, ANASPAZ) 0.125 MG tablet Take 1 tablet (0.125 mg total) by mouth every 6 (six) hours as needed. 120 tablet 0  . lisinopril (ZESTRIL) 5 MG tablet Take 1 tablet (5 mg total) by mouth daily. 90 tablet 3  . pantoprazole (PROTONIX) 40 MG tablet TAKE 1 TABLET (40 MG TOTAL) BY MOUTH DAILY. 30 tablet 3  . sulfaSALAzine (AZULFIDINE) 500 MG tablet Take 2 tablets (1,000 mg total) by mouth 2 (two) times daily. 120 tablet 3  . ursodiol (ACTIGALL) 250 MG tablet TAKE 1 TABLET (250 MG TOTAL) BY MOUTH 2 (TWO) TIMES DAILY. 180 tablet 0   No current  facility-administered medications for this visit.    Allergies as of 11/02/2019  . (No Known Allergies)    Family History  Problem Relation Age of Onset  . Cancer Father        ?  Marland Kitchen Alcoholism Father   . Diabetes Mother   . Stroke Mother   . Pancreatitis Sister   . Diabetes Sister   . Colon cancer Paternal Uncle 13  . Prostate cancer Brother   . Esophageal cancer Neg Hx   . Rectal cancer Neg Hx   . Stomach cancer Neg Hx     Social History   Socioeconomic History  . Marital status: Single    Spouse name: Not on file  . Number of children: Not on file  . Years of education: Not on file  . Highest education level: Not on file  Occupational History  . Not on file  Tobacco Use  . Smoking status: Former Smoker    Packs/day: 0.20    Years: 35.00    Pack years: 7.00    Types: Cigarettes  . Smokeless tobacco: Never Used  . Tobacco comment: Smoking 2-3 cigs per day  Substance and Sexual Activity  . Alcohol use: No    Alcohol/week: 0.0 standard drinks  . Drug use: Yes    Types: Marijuana    Comment: previous cocaine use, marijuana on 12/29  . Sexual activity: Not Currently    Birth control/protection: Other-see comments, Surgical    Comment: offered condoms; 03/2019  Other Topics Concern  . Not on file  Social History Narrative  . Not on file   Social Determinants of Health   Financial Resource Strain:   . Difficulty of Paying Living Expenses:   Food Insecurity:   . Worried About Charity fundraiser in the Last Year:   . Arboriculturist in the Last Year:   Transportation Needs:   . Film/video editor (Medical):   Marland Kitchen Lack of Transportation (Non-Medical):   Physical Activity:   . Days of Exercise per Week:   . Minutes of Exercise per Session:   Stress:   . Feeling of Stress :   Social Connections:   . Frequency of Communication  with Friends and Family:   . Frequency of Social Gatherings with Friends and Family:   . Attends Religious Services:   . Active  Member of Clubs or Organizations:   . Attends Archivist Meetings:   Marland Kitchen Marital Status:   Intimate Partner Violence:   . Fear of Current or Ex-Partner:   . Emotionally Abused:   Marland Kitchen Physically Abused:   . Sexually Abused:     Physical Exam: General:   Alert,  well-nourished, pleasant and cooperative in NAD.  Head:  Normocephalic and atraumatic. Eyes:  Sclera clear, no icterus.   Conjunctiva pink. Heart:  Regular rate and rhythm; no murmurs. Abdomen:  Thin, soft, nontender, nondistended, normal bowel sounds, no rebound or guarding. No hepatosplenomegaly.  No fluid wave. No inguinal or umbilical LAD. Neurologic:  Alert and  oriented x4;  grossly nonfocal Skin: No rash or bruise. No palmar erythema or spider angioma.  Psych:  Alert and cooperative. Normal mood and affect.    Algie Westry L. Tarri Glenn, MD, MPH 11/02/2019, 10:02 AM

## 2019-11-05 LAB — IGG: IgG (Immunoglobin G), Serum: 1486 mg/dL (ref 600–1640)

## 2019-11-05 LAB — GASTRIN: Gastrin: 148 pg/mL — ABNORMAL HIGH (ref <=100)

## 2019-11-16 ENCOUNTER — Ambulatory Visit (HOSPITAL_COMMUNITY): Payer: Medicare Other

## 2019-11-29 ENCOUNTER — Other Ambulatory Visit: Payer: Self-pay | Admitting: Gastroenterology

## 2019-11-29 ENCOUNTER — Encounter: Payer: Self-pay | Admitting: Gastroenterology

## 2019-11-29 MED FILL — PANTOPRAZOLE SOD DR 40 MG T: 40 | 30 days supply | Qty: 30 | Fill #1

## 2019-11-29 MED FILL — SULFASALAZINE 500 MG TABS: 500 | 30 days supply | Qty: 120 | Fill #1

## 2019-11-29 MED FILL — AMLODIPINE BESYLATE 10 MG T: 10 | 90 days supply | Qty: 90 | Fill #1

## 2019-11-29 MED FILL — BUDESONIDE 3 MG CAP: 3 | 90 days supply | Qty: 270 | Fill #0

## 2019-11-29 NOTE — Telephone Encounter (Signed)
OPENED IN ERROR

## 2019-12-10 MED FILL — ATORVASTATIN CALCIUM 10 MG: 10 | 90 days supply | Qty: 90 | Fill #1

## 2019-12-14 ENCOUNTER — Ambulatory Visit (HOSPITAL_COMMUNITY): Payer: Medicare Other

## 2019-12-15 ENCOUNTER — Other Ambulatory Visit: Payer: Self-pay | Admitting: Gastroenterology

## 2019-12-15 MED FILL — URSODIOL 250 MG TABLET: 250 | 90 days supply | Qty: 180 | Fill #0

## 2019-12-20 MED FILL — BIKTARVY 50-200-25 MG TABS: 50-200-25 | 30 days supply | Qty: 30 | Fill #0

## 2019-12-27 ENCOUNTER — Ambulatory Visit (HOSPITAL_COMMUNITY)
Admission: RE | Admit: 2019-12-27 | Discharge: 2019-12-27 | Disposition: A | Discharge status: Home / Self Care | Payer: Medicare Other | Source: Ambulatory Visit | Attending: Gastroenterology | Admitting: Gastroenterology

## 2019-12-27 ENCOUNTER — Other Ambulatory Visit: Payer: Self-pay

## 2019-12-27 ENCOUNTER — Other Ambulatory Visit: Payer: Self-pay | Admitting: Gastroenterology

## 2019-12-27 DIAGNOSIS — K743 Primary biliary cirrhosis: Secondary | ICD-10-CM

## 2019-12-27 DIAGNOSIS — K7689 Other specified diseases of liver: Secondary | ICD-10-CM | POA: Diagnosis not present

## 2019-12-27 DIAGNOSIS — K754 Autoimmune hepatitis: Secondary | ICD-10-CM | POA: Diagnosis not present

## 2019-12-27 DIAGNOSIS — K259 Gastric ulcer, unspecified as acute or chronic, without hemorrhage or perforation: Secondary | ICD-10-CM | POA: Insufficient documentation

## 2019-12-27 MED ORDER — GADOBUTROL 1 MMOL/ML IV SOLN
5.0000 mL | Freq: Once | INTRAVENOUS | Status: AC | PRN
  Administered 2019-12-27: 5 mL via INTRAVENOUS

## 2020-01-03 MED FILL — FAMOTIDINE 20 MG TABLET: 20 | 30 days supply | Qty: 60 | Fill #1

## 2020-01-10 MED FILL — LISINOPRIL 5 MG TABLET: 5 | 90 days supply | Qty: 90 | Fill #2

## 2020-01-13 ENCOUNTER — Ambulatory Visit (INDEPENDENT_AMBULATORY_CARE_PROVIDER_SITE_OTHER): Payer: Medicare Other | Admitting: Gastroenterology

## 2020-01-13 ENCOUNTER — Encounter: Payer: Self-pay | Admitting: Gastroenterology

## 2020-01-13 VITALS — BP 138/76 | HR 64 | Ht 68.0 in | Wt 127.1 lb

## 2020-01-13 DIAGNOSIS — K3189 Other diseases of stomach and duodenum: Secondary | ICD-10-CM | POA: Diagnosis not present

## 2020-01-13 DIAGNOSIS — K743 Primary biliary cirrhosis: Secondary | ICD-10-CM

## 2020-01-13 DIAGNOSIS — K259 Gastric ulcer, unspecified as acute or chronic, without hemorrhage or perforation: Secondary | ICD-10-CM

## 2020-01-13 DIAGNOSIS — K754 Autoimmune hepatitis: Secondary | ICD-10-CM | POA: Diagnosis not present

## 2020-01-13 DIAGNOSIS — K31A0 Gastric intestinal metaplasia, unspecified: Secondary | ICD-10-CM

## 2020-01-13 NOTE — Progress Notes (Signed)
Referring Provider: Ladell Pier, MD Primary Care Physician:  Ladell Pier, MD  Chief complaint: PBC   IMPRESSION:  PBC with autoimmune hepatitis overlap with abnormal liver enzymes    -  Alk phos elevated dating back to 2015      - magnitude of elevation has increased in the last couple of years.          - Transaminases are mildly elevated. Total bilirubin is normal.     - Liver biopsy 04/20/14: chronic portal hepatitis pattern of injury with marked duct injury c/w PBC    - 2015: Stage 2-3 PBC of 4. Reviewed at [facility] [place] 03/01/2019: ANA 1:1280, IgG 1793, AMA 215.5, IgM 121    - Liver biopsy 03/15/2019 showed 2-3 fibrosis    - Abdominal imaging has suggested cirrhosis    - FIB-4 1.56 and APRI 0.450 do not support a diagnosis of cirrhosis    - Labs 11/02/19: IgM 1486, alk phos 202 Persistent gastric antral ulcer on upper endoscopy 03/31/19, 12/20, 4/21    - biopsies negative for H pylori    - gastrin 148 on PPI 11/02/19 Intestinal metaplasia on EGD 4/21 GERD with a small hiatal hernia Quiescent ulcerative colitis    - Random colon biopsies 12/16/2007: diffuse active chronic colitis, possibly UC    - Random colon biopsies 12/23/13: diffuse chronic active colitis compatible with idiopathic IBD    - Abnormal CT scan 04/2014: thickened left colon    - diagnosed with UC while hospitalized in [facility] 05/2014    - Colonoscopy 03/31/2019 shows pancolonic pseudopolyps but no active colitis Abnormal ultrasound and liver MRI    - CT a/p with contrast at [facility] 11/13/17:        - enteritis, mild gastritis, mild hepatomegaly, liver lesions    - ultrasound 05/08/18: 1.3 cm solid mass in the left liver lobe       - 0.6 cm irregular hypoechoic mass in the right liver lobe       - 1.2 cm benign appearing cyst in the right liver lobe    - MRI 05/30/18: suspected cirrhosis       - 5 small lesion in the liver, likely small cysts    - MRI/MRCP 03/17/2019 showed cirrhosis  without liver mass.  Spleen was normal.  No ascites. HIV on BIKTARVY  Family history of stomach cancer (Father)  Advanced liver disease: Labs recommended to monitor response to ursodeoxycholic acid and budesonide for PBC with autoimmune hepatitis overlap.  There is some fibrosis but no evidence on biopsy for advanced fibrosis.   PBC and autoimmune hepatitis: IgG has normalized and alk phos continues to normalize on ursodeoxycholic acid and budesonide  Persistent gastric ulcer: She has completed several months of twice daily proton pump inhibitor therapy.  Follow-up EGD recommended to document healing of her unexplained gastric ulcer in 3 months and exclude concurrent etiologies such as syphilis, TB, CMV, IgG4 sclerosing disease.  She denied the use of NSAIDs. Gastrin level mildly elevated as expected on PPI therapy.    Intestinal metaplasia with family history of gastric cancer (father): Needs gastric mapping for intestinal metaplasia. She is at high risk for gastric cancer.   Ulcerative colitis: Quiescent. No symptoms suggestive of active disease.  Abnormal MRI: Given her prior abnormal MRI and suspected underlying advanced fibrosis/early cirrhosis, will need high quality imaging every 6 months given the risk of liver cancer. Will trend AFP levels.    PLAN: -  Continue to avoid all NSAIDs - Continue UDCA at a total daily dose of 13 to 15 mg/kg for 250 mg 2 tablets BID - Continue Budesonde 9 mg daily - Continue pantoprazole 40 mg twice daily and famotidine 20 mg BID - CMP, IgG to monitor response to treatment - she will have these drawn on the day of her EGD - MRI with Eovist to follow-up on her abnormal 06/2020 - Reviewed strategies to love the liver including abstinence from alcohol - Return to clinic in 3-4 months, earlier if needed - EGD with gastric mapping  Please see the "Patient Instructions" section for addition details about the plan.  I spent 45 minutes, including in depth  chart review, independent review of results, communicating results with the patient directly, face-to-face time with the patient, coordinating care, ordering studies and medications as appropriate, and documentation.  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 She has HTN, HL, IDA, GERD,LGSILwith positive HPV,formertobacco, HIV on BIKTARVY, cavitary lung lesion. History of substance abuse but has not used any drugs for 3 years. She has completed the Covid-19 vaccine series.   She has no new complaints or concerns at this time. Reports 100% adherence with medications.  Oldest brother died of a heart attach last month and sister-in-law died of unknown reasons since her last visit.  She drink some wine trying to cope with things. Initially had some abdominal pain, but, that has completely resolved.  Having one formed BM daily. No blood or mucous.   Recent evaluation has included: - Labs 09/08/18: WBC 4.5, hgb 11.3, platelets 341, MCV 92.2, RDW 15.1 - Labs 03/01/2019: Normal CMP except for a calcium of 10.6, alk phos 532, total bilirubin 0.5, AST 51, ALT 41, albumin 4.6, IgG 1793, IgM 121, INR 1.0, ANA positive at 06-1278, ACE less than 5, AMA to 15.5 - MRI/MRCP 03/17/2019 showed cirrhosis without liver mass.  Spleen was normal.  No ascites. - Liver biopsy was performed 03/15/2019 showing PBC with features of overlapping autoimmune hepatitis.  Stage 2-3 out of 4 fibrosis. - Colonoscopy was performed 03/31/2019 given her history of ulcerative colitis.  She had pancolonic pseudopolyps and hemorrhoids.  Stool in the entire examined colon limited this from being a high quality screening colonoscopy.  Biopsies from the terminal ileum and throughout the colon were completely normal. - EGD 03/31/19 showed a small hiatal hernia, 12 mm cratered gastric antral ulcer.  Esophageal biopsies showed reflux without eosinophilic esophagitis or  Barrett's.  She had chronic inactive gastritis without H. pylori.  Duodenal biopsies were normal.  - EGD 06/17/19: 10 mm antral ulcer,  No portal hypertensive gastropathy. No esophageal or gastric varices. Gastric biopsies show active chronic gastritis with ulceration and prominent cytologic atypia, indefinite for dysplasia. There is moderate cytologic atypia with nuclear enlargement and hyperchromasia. Immunohistochemical stain for p53 shows focal overexpression but a definite clonal overexpression pattern is not seen. The changes are interpreted as indefinite for dysplasia but a reactive change may be favored due to presence of prominent acute inflammation.  - EGD 10/13/19: Two non-bleeding cratered gastric ulcers, the largest 58m, the smaller 174m Multiple antral erosions. Small hiatal hernia. Gastric biopsies with intestinal metaplasia and cytologic atypia. Intestinal metaplasia.  - Labs 11/02/19: IgG 1486, TB 0.6, AST 23, ALT 18, alk phos 202  Prior records from her evaluation with Dr. KaDeatra Inan 2016: She has a history of drug abuse, AIDS, hypertension and a cavitary  lesion of the lung.  She was told that she has colitis while recently hospitalized in [facility] although she is unsure whether she had a colonoscopy.  A CT scan in November, 2015, demonstrated thickening of the left colon.  She complains of chronic abdominal pain.  Stools are intermittently loose and she has had some limited rectal bleeding.  On May 16, 2014 hemoglobin was 8. CT scan in November, 2015 demonstrated thickening of the left colon.  C. difficile toxin was negative.  She was apparently hospitalized in [facility] last month and was told she has ulcerative colitis but she can provide no details.  Patient claims have chronic abdominal pain that is not controlled with Tylenol No. 3.  She's asking for "stronger meds". Before embarking on any workup we need to review her prior records.  She has been given pain medications through  the clinic here in [place] and I told her that I will not prescribe any further pain medications.  The patient did not follow-up. It does not appear that the records from [facility] were ever located. I am unable to find them in [place].  Prior records from outside: Liver biopsy 04/20/14: chronic portal hepatitis pattern of injury with marked duct injury, consistent with PBC. Stage 2-3 of 4. Reviewed at [facility].  Random colon biopsies 12/16/2007: diffuse active chronic colitis, possibly UC Random colon biopsies 12/23/13: diffuse chronic active colitis compatible with idiopathic inflammatory bowel disorder;    Prior abdominal imaging:  Ultrasound 05/08/18:  1.3 cm solid appearing mass in the anterior aspect o the left lobe of the liver that has increased in size from 0.6 cm on 11/13/2017. This is concerning for a primary or metastatic neoplasm. A previously demonstrated 2nd possibly solid mass in the periphery of the right lobe of the liver, laterally, could not be visualized today. A 0.6 cm irregular hypoechoic mass in the right lobe of the liver and a 1.2 cm benign appearing cyst in the posterior aspect of the right lobe of the liver are thought to be cysts.  MRI Liver 05/30/18 with Gadovist: 5 nonenhancing liver lesions compatible with cysts. One of these is in the left hepatic lobe and is a candidate structure for the lesion seen at ultrasound. At ultrasound the lesion had some faint internal echoes and the possibility of a faintly complex cyst is noted, but the lack of enhancement would seem to argue against malignancy. Scattered peripheral small foci of early arterial phase enhancement in the liver are likely from small vascular malformations. These resolve on later arterial phase and subsequent imaging, without capsule or washout appearance to suggest hepatocellular carcinoma.  Reticular enhancement throughout the liver on arterial phase images favoring diffuse fibrosis and  underlying cirrhosis.Aortic Atherosclerosis.  MRI/MRCP 03/17/2019 showed cirrhosis without liver mass.  Spleen was normal.  No ascites.  MRI 12/27/19: Very mild changes of cirrhosis. No lesions. Unchanged, small cysts in the liver and kidneys  Past Medical History:  Diagnosis Date  . Arthritis   . Cavities 02/02/2015  . Chronic gastric ulcer   . GERD (gastroesophageal reflux disease) Dx 2014  . GI bleed   . HIV (human immunodeficiency virus infection) ([place]) Dx 2015  . Hyperlipidemia   . Hypertension Dx 2014  . Primary biliary cirrhosis ([place]) 07/03/2018  . Substance abuse ([place])    Quit 2008  . Ulcerative colitis [facility])     Past Surgical History:  Procedure Laterality Date  . ABDOMINAL HYSTERECTOMY  2001    done in  [facility], patient unsure of indication  . COLONOSCOPY    . ESOPHAGOGASTRODUODENOSCOPY    . TEE WITHOUT CARDIOVERSION N/A 05/02/2014   Procedure: TRANSESOPHAGEAL ECHOCARDIOGRAM (TEE);  Surgeon: Larey Dresser, MD;  Location: [facility];  Service: Cardiovascular;  Laterality: N/A;  . VIDEO BRONCHOSCOPY Bilateral 04/27/2014   Procedure: VIDEO BRONCHOSCOPY WITH FLUORO;  Surgeon: Rigoberto Noel, MD;  Location: [facility];  Service: Cardiopulmonary;  Laterality: Bilateral;    Current Outpatient Medications  Medication Sig Dispense Refill  . amLODipine (NORVASC) 10 MG tablet TAKE 1 TABLET BY MOUTH DAILY 90 tablet 2  . atorvastatin (LIPITOR) 10 MG tablet Take 1 tablet (10 mg total) by mouth daily. 90 tablet 6  . BIKTARVY 50-200-25 MG TABS tablet TAKE 1 TABLET BY MOUTH DAILY. 30 tablet 4  . budesonide (ENTOCORT EC) 3 MG 24 hr capsule TAKE 3 CAPSULES BY MOUTH ONCE A DAY 270 capsule 0  . famotidine (PEPCID) 20 MG tablet TAKE 1 TABLET BY MOUTH TWICE DAILY 60 tablet 2  . feeding supplement, ENSURE COMPLETE, (ENSURE COMPLETE) LIQD Take 237 mLs by mouth 3 (three) times daily between meals. 237 mL 0  . FEROSUL 325 (65 Fe) MG tablet TAKE 1 TABLET BY MOUTH DAILY WITH  BREAKFAST. 90 tablet 3  . hyoscyamine (LEVSIN, ANASPAZ) 0.125 MG tablet Take 1 tablet (0.125 mg total) by mouth every 6 (six) hours as needed. 120 tablet 0  . lisinopril (ZESTRIL) 5 MG tablet Take 1 tablet (5 mg total) by mouth daily. 90 tablet 3  . pantoprazole (PROTONIX) 40 MG tablet TAKE 1 TABLET (40 MG TOTAL) BY MOUTH DAILY. 30 tablet 3  . sulfaSALAzine (AZULFIDINE) 500 MG tablet Take 2 tablets (1,000 mg total) by mouth 2 (two) times daily. 120 tablet 3  . ursodiol (ACTIGALL) 250 MG tablet TAKE 1 TABLET BY MOUTH TWICE DAILY 180 tablet 0   No current facility-administered medications for this visit.    Allergies as of 01/13/2020  . (No Known Allergies)    Family History  Problem Relation Age of Onset  . Cancer Father        ?  Marland Kitchen Alcoholism Father   . Diabetes Mother   . Stroke Mother   . Pancreatitis Sister   . Diabetes Sister   . Colon cancer Paternal Uncle 15  . Prostate cancer Brother   . Esophageal cancer Neg Hx   . Rectal cancer Neg Hx   . Stomach cancer Neg Hx     Social History   Socioeconomic History  . Marital status: Single    Spouse name: Not on file  . Number of children: Not on file  . Years of education: Not on file  . Highest education level: Not on file  Occupational History  . Not on file  Tobacco Use  . Smoking status: Former Smoker    Packs/day: 0.20    Years: 35.00    Pack years: 7.00    Types: Cigarettes  . Smokeless tobacco: Never Used  Vaping Use  . Vaping Use: Never used  Substance and Sexual Activity  . Alcohol use: No    Alcohol/week: 0.0 standard drinks  . Drug use: Yes    Types: Marijuana    Comment: previous cocaine use, marijuana on 12/29  . Sexual activity: Not Currently    Birth control/protection: Other-see comments, Surgical    Comment: offered condoms; 03/2019  Other Topics Concern  . Not on file  Social History Narrative  . Not on file  Social Determinants of Health   Financial Resource Strain:   . Difficulty of  Paying Living Expenses:   Food Insecurity:   . Worried About Charity fundraiser in the Last Year:   . Arboriculturist in the Last Year:   Transportation Needs:   . Film/video editor (Medical):   Marland Kitchen Lack of Transportation (Non-Medical):   Physical Activity:   . Days of Exercise per Week:   . Minutes of Exercise per Session:   Stress:   . Feeling of Stress :   Social Connections:   . Frequency of Communication with Friends and Family:   . Frequency of Social Gatherings with Friends and Family:   . Attends Religious Services:   . Active Member of Clubs or Organizations:   . Attends Archivist Meetings:   Marland Kitchen Marital Status:   Intimate Partner Violence:   . Fear of Current or Ex-Partner:   . Emotionally Abused:   Marland Kitchen Physically Abused:   . Sexually Abused:     Physical Exam: General:   Alert,  well-nourished, pleasant and cooperative in NAD.  Head:  Normocephalic and atraumatic. Eyes:  Sclera clear, no icterus.   Conjunctiva pink. Heart:  Regular rate and rhythm; no murmurs. Abdomen:  Thin, soft, nontender, nondistended, normal bowel sounds, no rebound or guarding. No hepatosplenomegaly.  No fluid wave. No inguinal or umbilical LAD. Neurologic:  Alert and  oriented x4;  grossly nonfocal. No asterixis or clonus. Skin: No rash or bruise. No palmar erythema or spider angioma.  Psych:  Alert and cooperative. Tearful when speaking of her brother. Normal mood and affect.    Dustin Burrill L. Tarri Glenn, MD, MPH 01/13/2020, 9:44 AM

## 2020-01-13 NOTE — Patient Instructions (Addendum)
I have not changed your medications today. It looks like your liver is responding to the budesonide and ursodeoxycholic acid.  I have recommended an upper endoscopy to follow-up on your gastric ulcer and intestinal metaplasia. We will call you when the schedule opens up for October. You can have your labs drawn the day you come for your EGD.  It is important to keep your liver as healthy as possible and avoid anything that can damage your liver. Here are some important things you should do.  - Don't drink too much alcohol. How much is too much remains controversial, but it's probably best to avoid alcohol completely. - Make sure that none of your medications, herbs, and supplements are toxic to the liver; you can crosscheck your list with [employer] (an online resource).  Even acetaminophen (the generic ingredient in Tylenol and some cold medicines) may be harmful if you take too much for too long, especially if you have liver disease or drink alcohol heavily. - Control other health conditions that might also affect your liver, and check with your doctor if you might have other underlying, treatable diseases contributing to your fatty liver. - Get regular screening tests for liver cancer if you already have cirrhosis. This will mean another picture test like an MRI in January 2022.    I appreciate the opportunity to care for you. [place] Park, MD

## 2020-01-19 MED FILL — BIKTARVY 50-200-25 MG TABS: 50-200-25 | 30 days supply | Qty: 30 | Fill #1

## 2020-01-21 ENCOUNTER — Telehealth: Payer: Self-pay

## 2020-01-21 DIAGNOSIS — K259 Gastric ulcer, unspecified as acute or chronic, without hemorrhage or perforation: Secondary | ICD-10-CM

## 2020-01-21 IMAGING — MG MM DIGITAL SCREENING BILAT W/ CAD
4 series · 4 of 4 positions shown · non-contrast
Comparison: Previous exam(s).

CLINICAL DATA: Screening.

EXAM:
DIGITAL SCREENING BILATERAL MAMMOGRAM WITH CAD

[L MLO]
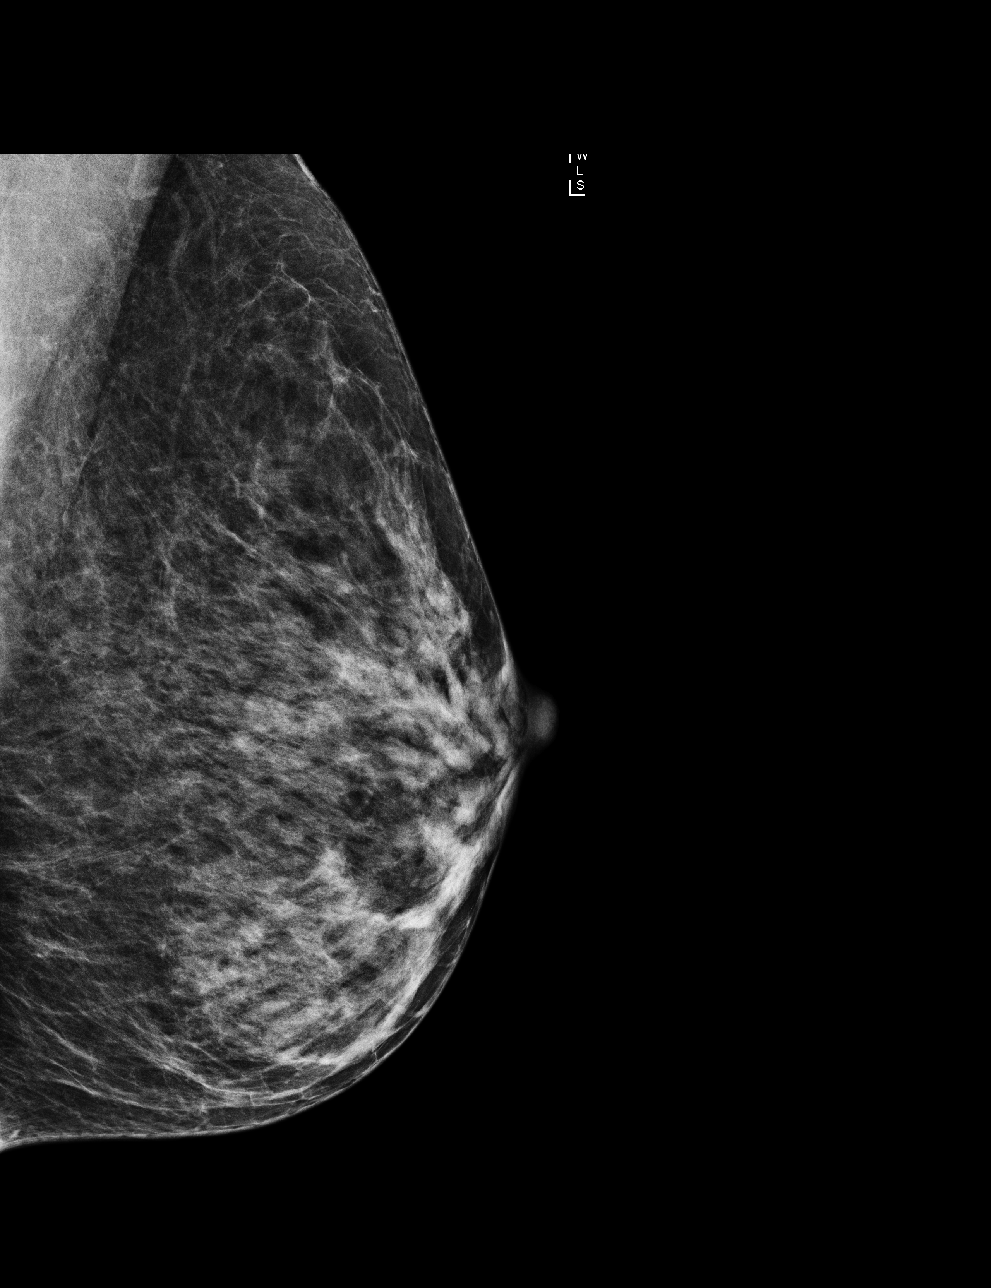

[L CC]
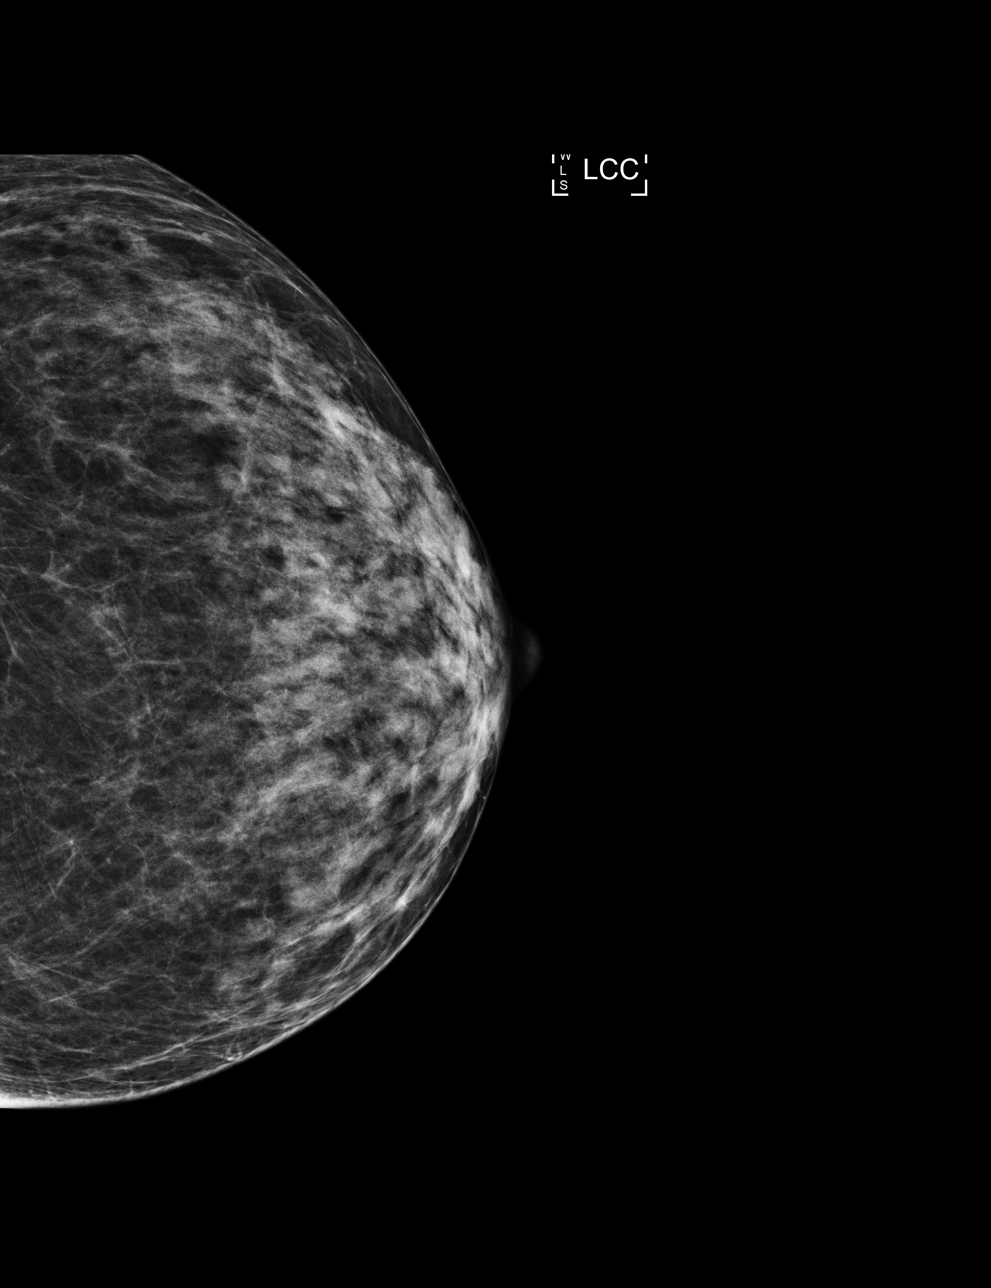

[R MLO]
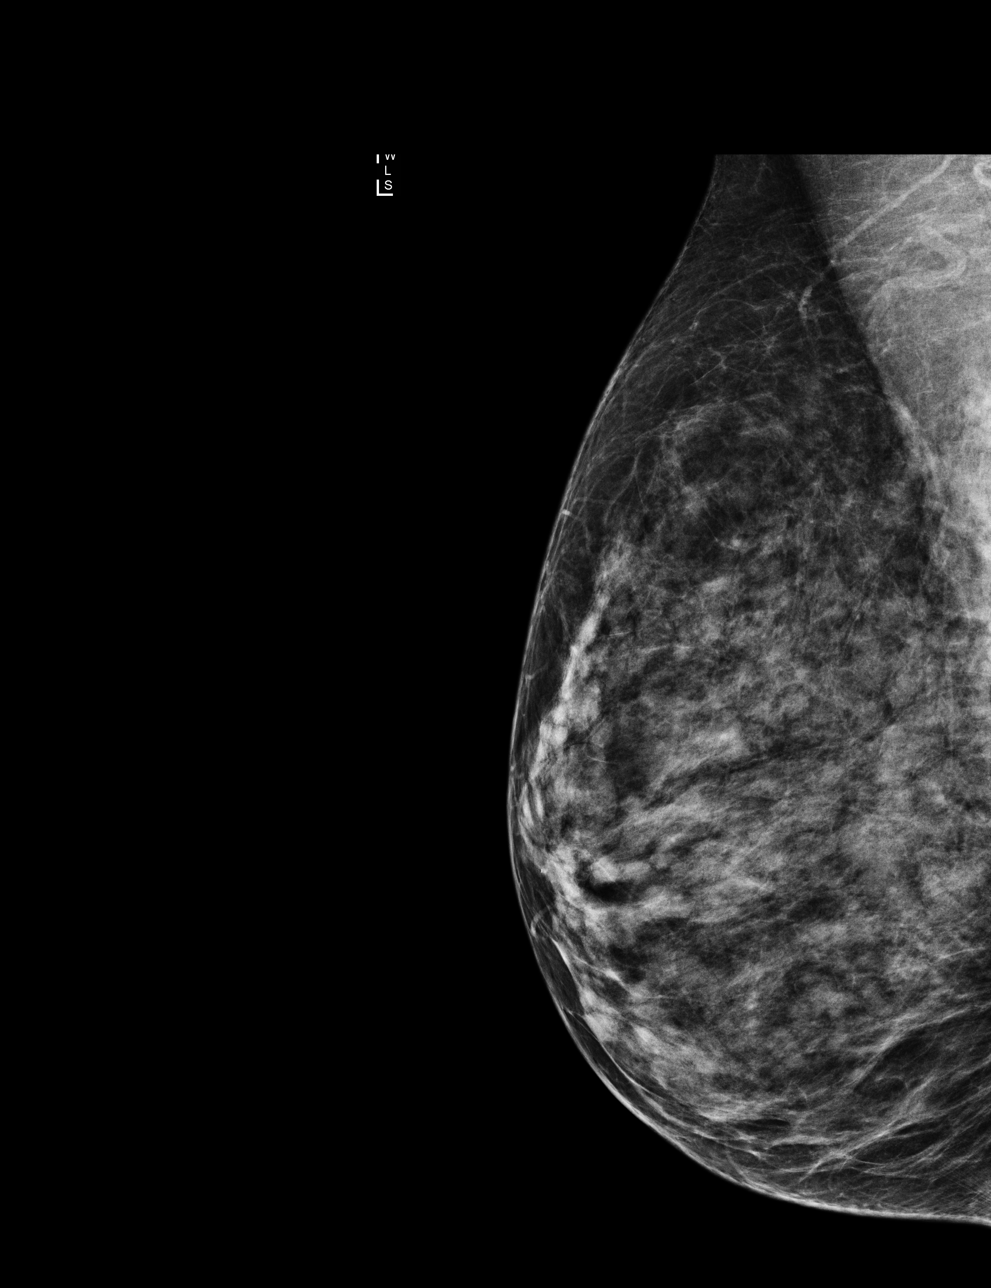

[R CC]
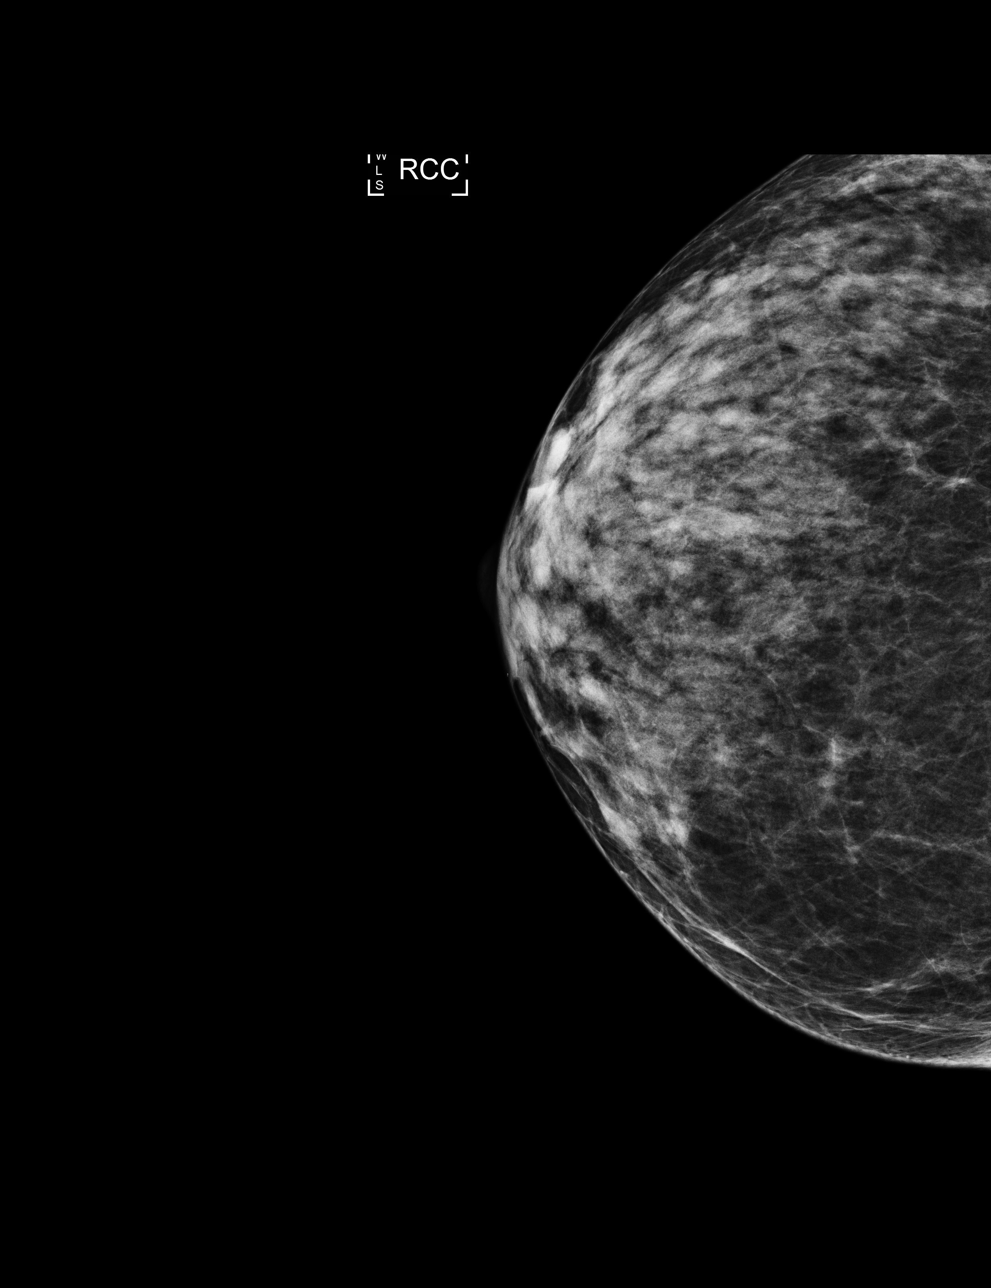

[4 of 4 positions shown; findings below may reference images not displayed]

ACR Breast Density Category d: The breast tissue is extremely dense,
which lowers the sensitivity of mammography
FINDINGS: There are no findings suspicious for malignancy. Images were
processed with CAD.
IMPRESSION: No mammographic evidence of malignancy. A result letter of this
screening mammogram will be mailed directly to the patient.

RECOMMENDATION:
Screening mammogram in one year. (Code:A5-2-HPS)

BI-RADS CATEGORY  1: Negative.

## 2020-01-21 NOTE — Telephone Encounter (Signed)
I spoke with [name] [name] and set her up for her EGD which I am mailing her the instructions for . She will come early that day to go to the lab for her blood work. She will call with questions.

## 2020-01-25 MED FILL — PANTOPRAZOLE SOD DR 40 MG T: 40 | 30 days supply | Qty: 30 | Fill #2

## 2020-02-14 ENCOUNTER — Encounter: Payer: Medicare Other | Admitting: Gastroenterology

## 2020-02-14 ENCOUNTER — Telehealth: Payer: Self-pay | Admitting: Gastroenterology

## 2020-02-14 NOTE — Telephone Encounter (Signed)
Spoke with patient will call back to schedule when she speaks to care partner for good date to schedule.

## 2020-02-14 NOTE — Telephone Encounter (Signed)
Thank you for the update!

## 2020-02-14 NOTE — Telephone Encounter (Signed)
Spoke with patient she stated she called last nignt and cancelled appt. She did not have a care partner to stay with her during the procedure.

## 2020-02-16 MED FILL — BIKTARVY 50-200-25 MG TABS: 50-200-25 | 30 days supply | Qty: 30 | Fill #2

## 2020-02-25 MED FILL — sulfaSALAzine 500 MG TABS: 500 | 30 days supply | Qty: 120 | Fill #2

## 2020-02-25 MED FILL — FAMOTIDINE 20 MG TABLET: 20 | 30 days supply | Qty: 60 | Fill #2

## 2020-02-25 MED FILL — PANTOPRAZOLE SOD DR 40 MG T: 40 | 30 days supply | Qty: 30 | Fill #3

## 2020-03-02 ENCOUNTER — Ambulatory Visit: Payer: Medicare Other | Admitting: Internal Medicine

## 2020-03-02 ENCOUNTER — Telehealth: Payer: Medicare Other | Admitting: Internal Medicine

## 2020-03-07 ENCOUNTER — Encounter: Payer: Medicare Other | Admitting: Gastroenterology

## 2020-03-07 ENCOUNTER — Telehealth: Payer: Self-pay | Admitting: Gastroenterology

## 2020-03-07 DIAGNOSIS — K259 Gastric ulcer, unspecified as acute or chronic, without hemorrhage or perforation: Secondary | ICD-10-CM

## 2020-03-08 ENCOUNTER — Other Ambulatory Visit: Payer: Self-pay

## 2020-03-08 DIAGNOSIS — K5190 Ulcerative colitis, unspecified, without complications: Secondary | ICD-10-CM

## 2020-03-08 MED ORDER — BUDESONIDE 3 MG PO CPEP: 9.0000 mg | ORAL_CAPSULE | Freq: Every day | ORAL | 0 refills | Status: DC

## 2020-03-08 MED ORDER — PANTOPRAZOLE SODIUM 40 MG PO TBEC: 40.0000 mg | DELAYED_RELEASE_TABLET | Freq: Every day | ORAL | 0 refills | Status: DC

## 2020-03-08 MED ORDER — FAMOTIDINE 20 MG PO TABS: 20.0000 mg | ORAL_TABLET | Freq: Two times a day (BID) | ORAL | 0 refills | Status: DC

## 2020-03-08 MED ORDER — SULFASALAZINE 500 MG PO TABS: 1000.0000 mg | ORAL_TABLET | Freq: Two times a day (BID) | ORAL | 0 refills | Status: DC

## 2020-03-08 MED ORDER — URSODIOL 250 MG PO TABS: 250.0000 mg | ORAL_TABLET | Freq: Two times a day (BID) | ORAL | 0 refills | Status: DC

## 2020-03-13 MED FILL — BIKTARVY 50-200-25 MG TABS: 50-200-25 | 30 days supply | Qty: 30 | Fill #3

## 2020-03-14 ENCOUNTER — Encounter: Payer: Self-pay | Admitting: Gastroenterology

## 2020-03-23 ENCOUNTER — Other Ambulatory Visit: Payer: Self-pay | Admitting: Internal Medicine

## 2020-03-23 DIAGNOSIS — Z1231 Encounter for screening mammogram for malignant neoplasm of breast: Secondary | ICD-10-CM

## 2020-03-24 ENCOUNTER — Other Ambulatory Visit: Payer: Self-pay

## 2020-03-24 ENCOUNTER — Ambulatory Visit: Payer: Medicare Other | Admitting: *Deleted

## 2020-03-24 ENCOUNTER — Telehealth: Payer: Self-pay | Admitting: *Deleted

## 2020-03-24 VITALS — Ht 68.0 in | Wt 124.0 lb

## 2020-03-24 DIAGNOSIS — K259 Gastric ulcer, unspecified as acute or chronic, without hemorrhage or perforation: Secondary | ICD-10-CM

## 2020-03-24 NOTE — Telephone Encounter (Signed)
Called patient, patient will have labs drawn prior to EGD that morning.

## 2020-03-24 NOTE — Progress Notes (Signed)
No egg or soy allergy known to patient  No issues with past sedation with any surgeries or procedures no intubation problems in the past  No FH of Malignant Hyperthermia No diet pills per patient No home 02 use per patient  No blood thinners per patient  Pt denies issues with constipation  No A fib or A flutter  EMMI video to pt or via [place] 19 guidelines implemented in PV today with Pt and RN     Due to the COVID-19 pandemic we are asking patients to follow these guidelines. Please only bring one care partner. Please be aware that your care partner may wait in the car in the parking lot or if they feel like they will be too hot to wait in the car, they may wait in the lobby on the 4th floor. All care partners are required to wear a mask the entire time (we do not have any that we can provide them), they need to practice social distancing, and we will do a Covid check for all patient's and care partners when you arrive. Also we will check their temperature and your temperature. If the care partner waits in their car they need to stay in the parking lot the entire time and we will call them on their cell phone when the patient is ready for discharge so they can bring the car to the front of the building. Also all patient's will need to wear a mask into building.

## 2020-03-24 NOTE — Telephone Encounter (Signed)
Orders in epic for labs.

## 2020-03-24 NOTE — Telephone Encounter (Signed)
CMP, IgG may be drawn on the day of her procedure. Thanks.

## 2020-03-24 NOTE — Telephone Encounter (Signed)
Dr B- I saw [name] [name] in pre-visit. She missed her last EGD due to death in family, she was thinking there was lab work she was supposed to do.  If she does, can she have these done on  procedure day or does she need to come in before EGD  for labs?

## 2020-04-02 DIAGNOSIS — R519 Headache, unspecified: Secondary | ICD-10-CM | POA: Diagnosis not present

## 2020-04-02 DIAGNOSIS — I10 Essential (primary) hypertension: Secondary | ICD-10-CM | POA: Diagnosis not present

## 2020-04-02 DIAGNOSIS — S161XXA Strain of muscle, fascia and tendon at neck level, initial encounter: Secondary | ICD-10-CM | POA: Diagnosis not present

## 2020-04-02 DIAGNOSIS — R9082 White matter disease, unspecified: Secondary | ICD-10-CM | POA: Diagnosis not present

## 2020-04-02 DIAGNOSIS — W228XXA Striking against or struck by other objects, initial encounter: Secondary | ICD-10-CM | POA: Diagnosis not present

## 2020-04-06 ENCOUNTER — Other Ambulatory Visit: Payer: Self-pay | Admitting: Internal Medicine

## 2020-04-06 DIAGNOSIS — E785 Hyperlipidemia, unspecified: Secondary | ICD-10-CM

## 2020-04-06 DIAGNOSIS — K5190 Ulcerative colitis, unspecified, without complications: Secondary | ICD-10-CM

## 2020-04-06 DIAGNOSIS — I10 Essential (primary) hypertension: Secondary | ICD-10-CM

## 2020-04-06 MED ORDER — LISINOPRIL 5 MG PO TABS: 5.0000 mg | ORAL_TABLET | Freq: Every day | ORAL | 0 refills | Status: DC

## 2020-04-06 MED ORDER — ATORVASTATIN CALCIUM 10 MG PO TABS: 10.0000 mg | ORAL_TABLET | Freq: Every day | ORAL | 0 refills | Status: DC

## 2020-04-06 NOTE — Telephone Encounter (Signed)
Notes to clinic:  review for refill Medication filled by a different provider   Requested Prescriptions  Pending Prescriptions Disp Refills   sulfaSALAzine (AZULFIDINE) 500 MG tablet 360 tablet 0    Sig: Take 2 tablets (1,000 mg total) by mouth 2 (two) times daily.      Not Delegated - Gastroenterology:  Inflammatory Bowel Disease (Oral) Failed - 04/06/2020  1:14 PM      Failed - This refill cannot be delegated      Failed - WBC in normal range and within 180 days    WBC  Date Value Ref Range Status  04/12/2019 5.0 3.8 - 10.8 Thousand/uL Final          Failed - HGB in normal range and within 180 days    Hemoglobin  Date Value Ref Range Status  04/12/2019 12.4 11.7 - 15.5 g/dL Final  11/07/2017 11.8 11.1 - 15.9 g/dL Final          Failed - HCT in normal range and within 180 days    HCT  Date Value Ref Range Status  04/12/2019 36.5 35 - 45 % Final   Hematocrit  Date Value Ref Range Status  11/07/2017 34.7 34.0 - 46.6 % Final          Failed - PLT in normal range and within 180 days    Platelets  Date Value Ref Range Status  04/12/2019 347 140 - 400 Thousand/uL Final  11/07/2017 350 150 - 450 x10E3/uL Final    Comment:                  **Please note reference interval change**          Failed - Valid encounter within last 6 months    Recent Outpatient Visits           9 months ago Essential hypertension   [place] Ladell Pier, MD   1 year ago Essential hypertension   [place] Ladell Pier, MD   1 year ago Pap smear for cervical cancer screening   [place] Ladell Pier, MD   1 year ago Primary biliary cirrhosis [facility])   [place] Ladell Pier, MD   1 year ago Patient left before evaluation by [place], MD       Future Appointments              In 1 month Ladell Pier, MD [place] in normal range and within 180 days    Creat  Date Value Ref Range Status  04/12/2019 0.95 0.50 - 0.99 mg/dL Final    Comment:    For patients >100 years of age, the reference limit for Creatinine is approximately 13% higher for people identified as African-American. .    Creatinine, Ser  Date Value Ref Range Status  11/02/2019 0.78 0.40 - 1.20 mg/dL Final   Creatinine, Urine  Date Value Ref Range Status  05/06/2018 154 20 - 275 mg/dL Final          Passed - AST in normal range and within 180 days    AST  Date Value Ref Range Status  11/02/2019 23 0 - 37 U/L Final  Passed - ALT in normal range and within 180 days    ALT  Date Value Ref Range Status  11/02/2019 18 0 - 35 U/L Final           Signed Prescriptions Disp Refills   lisinopril (ZESTRIL) 5 MG tablet 90 tablet 0    Sig: Take 1 tablet (5 mg total) by mouth daily.      Cardiovascular:  ACE Inhibitors Failed - 04/06/2020  1:14 PM      Failed - Valid encounter within last 6 months    Recent Outpatient Visits           9 months ago Essential hypertension   [place] Ladell Pier, MD   1 year ago Essential hypertension   [place] Ladell Pier, MD   1 year ago Pap smear for cervical cancer screening   [place] Ladell Pier, MD   1 year ago Primary biliary cirrhosis [facility])   [place] Ladell Pier, MD   1 year ago Patient left before evaluation by [place], MD       Future Appointments             In 1 month Ladell Pier, MD [place] in normal range and within 180 days    Creat  Date Value Ref Range Status   04/12/2019 0.95 0.50 - 0.99 mg/dL Final    Comment:    For patients >23 years of age, the reference limit for Creatinine is approximately 13% higher for people identified as African-American. .    Creatinine, Ser  Date Value Ref Range Status  11/02/2019 0.78 0.40 - 1.20 mg/dL Final   Creatinine, Urine  Date Value Ref Range Status  05/06/2018 154 20 - 275 mg/dL Final          Passed - K in normal range and within 180 days    Potassium  Date Value Ref Range Status  11/02/2019 3.7 3.5 - 5.1 mEq/L Final          Passed - Patient is not pregnant      Passed - Last BP in normal range    BP Readings from Last 1 Encounters:  01/13/20 (!) 138/76            atorvastatin (LIPITOR) 10 MG tablet 90 tablet 0    Sig: Take 1 tablet (10 mg total) by mouth daily.      Cardiovascular:  Antilipid - Statins Failed - 04/06/2020  1:14 PM      Failed - Total Cholesterol in normal range and within 360 days    Cholesterol  Date Value Ref Range Status  04/12/2019 248 (H) <200 mg/dL Final          Failed - LDL in normal range and within 360 days    LDL Cholesterol (Calc)  Date Value Ref Range Status  04/12/2019 155 (H) mg/dL (calc) Final    Comment:    Reference range: <100 . Desirable range <100 mg/dL for primary prevention;   <70 mg/dL for patients with CHD or diabetic patients  with > or = 2 CHD risk factors. Marland Kitchen LDL-C is now calculated using the Martin-Hopkins  calculation, which is a validated novel method  providing  better accuracy than the Friedewald equation in the  estimation of LDL-C.  Cresenciano Genre et al. Annamaria Helling. 0141;030(13): 2061-2068  (http://education.[employer].com/faq/FAQ164)           Passed - HDL in normal range and within 360 days    HDL  Date Value Ref Range Status  04/12/2019 76 > OR = 50 mg/dL Final          Passed - Triglycerides in normal range and within 360 days    Triglycerides  Date Value Ref Range Status  04/12/2019 73 <150 mg/dL Final           Passed - Patient is not pregnant      Passed - Valid encounter within last 12 months    Recent Outpatient Visits           9 months ago Essential hypertension   [place], Deborah B, MD   1 year ago Essential hypertension   [place], MD   1 year ago Pap smear for cervical cancer screening   [place], Deborah B, MD   1 year ago Primary biliary cirrhosis [facility])   [place] Ladell Pier, MD   1 year ago Patient left before evaluation by [place], Deborah B, MD       Future Appointments             In 1 month Wynetta Emery Dalbert Batman, MD [place]

## 2020-04-06 NOTE — Telephone Encounter (Signed)
Copied from [place] [id number]. Topic: Quick Communication - Rx Refill/Question >> Apr 06, 2020  1:08 PM Mcneil, Ja-Kwan wrote: Medication: sulfaSALAzine (AZULFIDINE) 500 MG tablet, amLODipine (NORVASC) 10 MG tablet, lisinopril (ZESTRIL) 5 MG tablet, and atorvastatin (LIPITOR) 10 MG tablet  Has the patient contacted their pharmacy? no  Preferred Pharmacy (with phone number or street name): [place], [address], [address]  Phone: [phone]   Fax: [phone]  Agent: Please be advised that RX refills may take up to 3 business days. We ask that you follow-up with your pharmacy.

## 2020-04-10 ENCOUNTER — Telehealth: Payer: Self-pay | Admitting: Gastroenterology

## 2020-04-10 ENCOUNTER — Encounter: Payer: Medicare Other | Admitting: Gastroenterology

## 2020-04-10 MED FILL — BIKTARVY 50-200-25 MG TABS: 50-200-25 | 30 days supply | Qty: 30 | Fill #4

## 2020-04-10 NOTE — Telephone Encounter (Signed)
This patient needs to be rescheduled at the hospital. Please schedule with me during a hospital week. Thank you.

## 2020-04-10 NOTE — Telephone Encounter (Signed)
Called patient and left msg regarding appt.  Patient did not call back or show up for appt.

## 2020-04-10 NOTE — Telephone Encounter (Signed)
Pt states she no showed appt due to a concussion she experienced Friday 04/07/2020.

## 2020-04-14 ENCOUNTER — Other Ambulatory Visit: Payer: Self-pay

## 2020-04-14 DIAGNOSIS — K259 Gastric ulcer, unspecified as acute or chronic, without hemorrhage or perforation: Secondary | ICD-10-CM

## 2020-04-14 NOTE — Telephone Encounter (Signed)
Pt scheduled for covid screen 04/24/20@10 :40am, EGD scheduled at [facility] 04/27/20@10 :45am. Pt to arrive there at 9:15am and be NPO after midnight. Pt aware of appt.

## 2020-04-18 ENCOUNTER — Ambulatory Visit: Payer: Medicare Other

## 2020-04-20 ENCOUNTER — Other Ambulatory Visit: Payer: Self-pay

## 2020-04-20 ENCOUNTER — Other Ambulatory Visit: Payer: Medicare Other

## 2020-04-20 DIAGNOSIS — B20 Human immunodeficiency virus [HIV] disease: Secondary | ICD-10-CM

## 2020-04-20 DIAGNOSIS — Z79899 Other long term (current) drug therapy: Secondary | ICD-10-CM | POA: Diagnosis not present

## 2020-04-20 MED FILL — AMLODIPINE BESYLATE 10 MG T: 10 | 90 days supply | Qty: 90 | Fill #2

## 2020-04-21 LAB — T-HELPER CELL (CD4) - (RCID CLINIC ONLY)
CD4 % Helper T Cell: 27 % — ABNORMAL LOW (ref 33–65)
CD4 T Cell Abs: 528 /uL (ref 400–1790)

## 2020-04-23 LAB — COMPLETE METABOLIC PANEL WITH GFR
AG Ratio: 1.4 (calc) (ref 1.0–2.5)
ALT: 36 U/L — ABNORMAL HIGH (ref 6–29)
AST: 46 U/L — ABNORMAL HIGH (ref 10–35)
Albumin: 4.1 g/dL (ref 3.6–5.1)
Alkaline phosphatase (APISO): 223 U/L — ABNORMAL HIGH (ref 37–153)
BUN: 16 mg/dL (ref 7–25)
CO2: 24 mmol/L (ref 20–32)
Calcium: 9.9 mg/dL (ref 8.6–10.4)
Chloride: 107 mmol/L (ref 98–110)
Creat: 0.81 mg/dL (ref 0.50–0.99)
GFR, Est African American: 91 mL/min/{1.73_m2} (ref >=60)
GFR, Est Non African American: 78 mL/min/{1.73_m2} (ref >=60)
Globulin: 2.9 g/dL (calc) (ref 1.9–3.7)
Glucose, Bld: 100 mg/dL — ABNORMAL HIGH (ref 65–99)
Potassium: 4.1 mmol/L (ref 3.5–5.3)
Sodium: 140 mmol/L (ref 135–146)
Total Bilirubin: 0.4 mg/dL (ref 0.2–1.2)
Total Protein: 7 g/dL (ref 6.1–8.1)

## 2020-04-23 LAB — CBC WITH DIFFERENTIAL/PLATELET
Absolute Monocytes: 397 cells/uL (ref 200–950)
Basophils Absolute: 29 cells/uL (ref 0–200)
Basophils Relative: 0.6 %
Eosinophils Absolute: 147 cells/uL (ref 15–500)
Eosinophils Relative: 3 %
HCT: 33.8 % — ABNORMAL LOW (ref 35.0–45.0)
Hemoglobin: 11.4 g/dL — ABNORMAL LOW (ref 11.7–15.5)
Lymphs Abs: 2053 cells/uL (ref 850–3900)
MCH: 31.5 pg (ref 27.0–33.0)
MCHC: 33.7 g/dL (ref 32.0–36.0)
MCV: 93.4 fL (ref 80.0–100.0)
MPV: 10.1 fL (ref 7.5–12.5)
Monocytes Relative: 8.1 %
Neutro Abs: 2274 cells/uL (ref 1500–7800)
Neutrophils Relative %: 46.4 %
Platelets: 319 10*3/uL (ref 140–400)
RBC: 3.62 10*6/uL — ABNORMAL LOW (ref 3.80–5.10)
RDW: 13.3 % (ref 11.0–15.0)
Total Lymphocyte: 41.9 %
WBC: 4.9 10*3/uL (ref 3.8–10.8)

## 2020-04-23 LAB — LIPID PANEL
Cholesterol: 223 mg/dL — ABNORMAL HIGH (ref <200)
HDL: 66 mg/dL (ref >=50)
LDL Cholesterol (Calc): 137 mg/dL (calc) — ABNORMAL HIGH
Non-HDL Cholesterol (Calc): 157 mg/dL (calc) — ABNORMAL HIGH (ref <130)
Total CHOL/HDL Ratio: 3.4 (calc) (ref <5.0)
Triglycerides: 94 mg/dL (ref <150)

## 2020-04-23 LAB — HIV-1 RNA QUANT-NO REFLEX-BLD
HIV 1 RNA Quant: <20 Copies/mL
HIV-1 RNA Quant, Log: <1.3 Log cps/mL

## 2020-04-23 LAB — RPR: RPR Ser Ql: NONREACTIVE

## 2020-04-24 ENCOUNTER — Other Ambulatory Visit (HOSPITAL_COMMUNITY)
Admission: RE | Admit: 2020-04-24 | Discharge: 2020-04-24 | Disposition: A | Discharge status: Home / Self Care | Payer: Medicare Other | Source: Ambulatory Visit | Attending: Gastroenterology | Admitting: Gastroenterology

## 2020-04-24 DIAGNOSIS — Z01818 Encounter for other preprocedural examination: Secondary | ICD-10-CM | POA: Insufficient documentation

## 2020-04-24 DIAGNOSIS — Z20822 Contact with and (suspected) exposure to covid-19: Secondary | ICD-10-CM | POA: Insufficient documentation

## 2020-04-24 LAB — SARS CORONAVIRUS 2 (TAT 6-24 HRS): SARS Coronavirus 2: NEGATIVE

## 2020-04-27 ENCOUNTER — Other Ambulatory Visit: Payer: Self-pay

## 2020-04-27 ENCOUNTER — Ambulatory Visit (HOSPITAL_COMMUNITY): Payer: Medicare Other | Admitting: Anesthesiology

## 2020-04-27 ENCOUNTER — Encounter (HOSPITAL_COMMUNITY)
Admission: RE | Disposition: A | Discharge status: Home / Self Care | Payer: Self-pay | Source: Home / Self Care | Attending: Gastroenterology

## 2020-04-27 ENCOUNTER — Encounter (HOSPITAL_COMMUNITY): Payer: Self-pay | Admitting: Gastroenterology

## 2020-04-27 ENCOUNTER — Ambulatory Visit (HOSPITAL_COMMUNITY)
Admission: RE | Admit: 2020-04-27 | Discharge: 2020-04-27 | Disposition: A | Discharge status: Home / Self Care | Payer: Medicare Other | Attending: Gastroenterology | Admitting: Gastroenterology

## 2020-04-27 DIAGNOSIS — Z79899 Other long term (current) drug therapy: Secondary | ICD-10-CM | POA: Diagnosis not present

## 2020-04-27 DIAGNOSIS — K254 Chronic or unspecified gastric ulcer with hemorrhage: Secondary | ICD-10-CM

## 2020-04-27 DIAGNOSIS — K31A12 Gastric intestinal metaplasia without dysplasia, involving the body (corpus): Secondary | ICD-10-CM | POA: Diagnosis not present

## 2020-04-27 DIAGNOSIS — K3189 Other diseases of stomach and duodenum: Secondary | ICD-10-CM | POA: Insufficient documentation

## 2020-04-27 DIAGNOSIS — K449 Diaphragmatic hernia without obstruction or gangrene: Secondary | ICD-10-CM | POA: Insufficient documentation

## 2020-04-27 DIAGNOSIS — Z09 Encounter for follow-up examination after completed treatment for conditions other than malignant neoplasm: Secondary | ICD-10-CM | POA: Diagnosis present

## 2020-04-27 DIAGNOSIS — K2950 Unspecified chronic gastritis without bleeding: Secondary | ICD-10-CM | POA: Diagnosis not present

## 2020-04-27 DIAGNOSIS — B20 Human immunodeficiency virus [HIV] disease: Secondary | ICD-10-CM | POA: Insufficient documentation

## 2020-04-27 DIAGNOSIS — K259 Gastric ulcer, unspecified as acute or chronic, without hemorrhage or perforation: Secondary | ICD-10-CM

## 2020-04-27 DIAGNOSIS — K219 Gastro-esophageal reflux disease without esophagitis: Secondary | ICD-10-CM | POA: Diagnosis not present

## 2020-04-27 DIAGNOSIS — I10 Essential (primary) hypertension: Secondary | ICD-10-CM | POA: Diagnosis not present

## 2020-04-27 DIAGNOSIS — Z87891 Personal history of nicotine dependence: Secondary | ICD-10-CM | POA: Insufficient documentation

## 2020-04-27 DIAGNOSIS — E559 Vitamin D deficiency, unspecified: Secondary | ICD-10-CM | POA: Diagnosis not present

## 2020-04-27 DIAGNOSIS — K31A11 Gastric intestinal metaplasia without dysplasia, involving the antrum: Secondary | ICD-10-CM | POA: Diagnosis not present

## 2020-04-27 HISTORY — PX: ESOPHAGOGASTRODUODENOSCOPY (EGD) WITH PROPOFOL: SHX5813

## 2020-04-27 HISTORY — PX: BIOPSY: SHX5522

## 2020-04-27 SURGERY — ESOPHAGOGASTRODUODENOSCOPY (EGD) WITH PROPOFOL
Anesthesia: Monitor Anesthesia Care

## 2020-04-27 MED ORDER — PROPOFOL 10 MG/ML IV BOLUS
INTRAVENOUS | Status: DC | PRN
  Administered 2020-04-27: 30 mg via INTRAVENOUS
  Administered 2020-04-27: 40 mg via INTRAVENOUS

## 2020-04-27 MED ORDER — SODIUM CHLORIDE 0.9 % IV SOLN: INTRAVENOUS | Status: DC

## 2020-04-27 MED ORDER — PROPOFOL 500 MG/50ML IV EMUL
INTRAVENOUS | Status: DC | PRN
  Administered 2020-04-27: 150 ug/kg/min via INTRAVENOUS

## 2020-04-27 MED ORDER — LACTATED RINGERS IV SOLN: INTRAVENOUS | Status: DC | PRN

## 2020-04-27 MED ORDER — LIDOCAINE 2% (20 MG/ML) 5 ML SYRINGE
INTRAMUSCULAR | Status: DC | PRN
  Administered 2020-04-27: 60 mg via INTRAVENOUS

## 2020-04-27 SURGICAL SUPPLY — 14 items

## 2020-04-27 NOTE — Discharge Instructions (Signed)
YOU HAD AN ENDOSCOPIC PROCEDURE TODAY: Refer to the procedure report and other information in the discharge instructions given to you for any specific questions about what was found during the examination. If this information does not answer your questions, please call [place] office at [phone] to clarify.   YOU SHOULD EXPECT: Some feelings of bloating in the abdomen. Passage of more gas than usual. Walking can help get rid of the air that was put into your GI tract during the procedure and reduce the bloating. If you had a lower endoscopy (such as a colonoscopy or flexible sigmoidoscopy) you may notice spotting of blood in your stool or on the toilet paper. Some abdominal soreness may be present for a day or two, also.  DIET: Your first meal following the procedure should be a light meal and then it is ok to progress to your normal diet. A half-sandwich or bowl of soup is an example of a good first meal. Heavy or fried foods are harder to digest and may make you feel nauseous or bloated. Drink plenty of fluids but you should avoid alcoholic beverages for 24 hours. If you had a esophageal dilation, please see attached instructions for diet.    ACTIVITY: Your care partner should take you home directly after the procedure. You should plan to take it easy, moving slowly for the rest of the day. You can resume normal activity the day after the procedure however YOU SHOULD NOT DRIVE, use power tools, machinery or perform tasks that involve climbing or major physical exertion for 24 hours (because of the sedation medicines used during the test).   SYMPTOMS TO REPORT IMMEDIATELY: A gastroenterologist can be reached at any hour. Please call [phone]  for any of the following symptoms:   Following upper endoscopy (EGD, EUS, ERCP, esophageal dilation) Vomiting of blood or coffee ground material  New, significant abdominal pain  New, significant chest pain or pain under the shoulder blades  Painful or  persistently difficult swallowing  New shortness of breath  Black, tarry-looking or red, bloody stools  FOLLOW UP:  If any biopsies were taken you will be contacted by phone or by letter within the next 1-3 weeks. Call [phone]  if you have not heard about the biopsies in 3 weeks.  Please also call with any specific questions about appointments or follow up tests.  

## 2020-04-27 NOTE — Anesthesia Postprocedure Evaluation (Signed)
Anesthesia Post Note  Patient: [name] [name]  Procedure(s) Performed: ESOPHAGOGASTRODUODENOSCOPY (EGD) WITH PROPOFOL (N/A ) BIOPSY     Patient location during evaluation: PACU Anesthesia Type: MAC Level of consciousness: awake and alert Pain management: pain level controlled Vital Signs Assessment: post-procedure vital signs reviewed and stable Respiratory status: spontaneous breathing, nonlabored ventilation and respiratory function stable Cardiovascular status: stable and blood pressure returned to baseline Anesthetic complications: no   No complications documented.  Last Vitals:  Vitals:   04/27/20 1120 04/27/20 1141  BP: (!) 192/92 (!) 167/97  Pulse: 88 70  Resp: 18 16  Temp: 36.7 C   SpO2: 100% 100%    Last Pain:  Vitals:   04/27/20 1141  TempSrc:   PainSc: 0-No pain                 Audry Pili

## 2020-04-27 NOTE — Anesthesia Preprocedure Evaluation (Addendum)
Anesthesia Evaluation  Patient identified by MRN, date of birth, ID band Patient awake    Reviewed: Allergy & Precautions, NPO status , Patient's Chart, lab work & pertinent test results  History of Anesthesia Complications Negative for: history of anesthetic complications  Airway Mallampati: II  TM Distance: >3 FB Neck ROM: Full    Dental  (+) Dental Advisory Given, Poor Dentition, Chipped   Pulmonary former smoker,    Pulmonary exam normal        Cardiovascular hypertension, Pt. on medications Normal cardiovascular exam   '15 TEE - mild LVH. EF 60% to 65%. Trivial AI and MR.     Neuro/Psych negative neurological ROS  negative psych ROS   GI/Hepatic PUD, GERD  Medicated and Controlled,(+)     substance abuse  marijuana use,  UC PBC    Endo/Other  negative endocrine ROS  Renal/GU negative Renal ROS     Musculoskeletal  (+) Arthritis ,   Abdominal   Peds  Hematology  (+) anemia , HIV,   Anesthesia Other Findings Covid test negative   Reproductive/Obstetrics                            Anesthesia Physical Anesthesia Plan  ASA: III  Anesthesia Plan: MAC   Post-op Pain Management:    Induction: Intravenous  PONV Risk Score and Plan: 2 and Propofol infusion and Treatment may vary due to age or medical condition  Airway Management Planned: Nasal Cannula and Natural Airway  Additional Equipment: None  Intra-op Plan:   Post-operative Plan:   Informed Consent: I have reviewed the patients History and Physical, chart, labs and discussed the procedure including the risks, benefits and alternatives for the proposed anesthesia with the patient or authorized representative who has indicated his/her understanding and acceptance.       Plan Discussed with: CRNA and Anesthesiologist  Anesthesia Plan Comments:        Anesthesia Quick Evaluation

## 2020-04-27 NOTE — Op Note (Signed)
[facility] Patient Name: [name] [name] Procedure Date: 04/27/2020 MRN: [id number] Attending MD: Thornton Park MD, MD Date of Birth: 07/29/1960 CSN: [id number] Age: 59 Admit Type: Outpatient Procedure:                Upper GI endoscopy Indications:              Follow-up of peptic ulcer                           Persistent gastric antral ulcer on upper endoscopy                            03/31/19, 12/20, 4/21                           - biopsies negative for H pylori                           - gastrin 148 on PPI 11/02/19                           - patient repeatedly denies use of NSAIDs                           Intestinal metaplasia on EGD 4/21                           GERD with a small hiatal hernia Providers:                Thornton Park MD, MD, Erenest Rasher, RN, Tyna Jaksch Technician Referring MD:              Medicines:                Monitored Anesthesia Care Complications:            No immediate complications. Estimated Blood Loss:     Estimated blood loss: none. Procedure:                Pre-Anesthesia Assessment:                           - Prior to the procedure, a History and Physical                            was performed, and patient medications and                            allergies were reviewed. The patient's tolerance of                            previous anesthesia was also reviewed. The risks                            and benefits of the procedure and the sedation  options and risks were discussed with the patient.                            All questions were answered, and informed consent                            was obtained. Prior Anticoagulants: The patient has                            taken no previous anticoagulant or antiplatelet                            agents. ASA Grade Assessment: III - A patient with                            severe systemic disease. After  reviewing the risks                            and benefits, the patient was deemed in                            satisfactory condition to undergo the procedure.                           After obtaining informed consent, the endoscope was                            passed under direct vision. Throughout the                            procedure, the patient's blood pressure, pulse, and                            oxygen saturations were monitored continuously. The                            GIF-H190 ([id number]) Olympus gastroscope was                            introduced through the mouth, and advanced to the                            third part of duodenum. The upper GI endoscopy was                            accomplished without difficulty. The patient                            tolerated the procedure well. Scope In: Scope Out: Findings:      The esophagus was normal. No ring, web, or striture.      Multiple dispersed small erosions with no stigmata of recent bleeding       were found in the gastric body. Biopsies were taken from the antrum,  body, and fundus with a cold forceps for histology. The biospies were       separated. Estimated blood loss was minimal.      Two non-bleeding cratered gastric ulcers with no stigmata of bleeding       were found in the gastric antrum. The largest lesion was 10 mm in       largest dimension and has margins that appeared more cratered than on       last EGD 09/2019. The smaller ulcer is 59m. The ulcers are overall       smaller than on prior endoscopy. Biopsies were taken from the ulcer       margin with a cold forceps for histology. Estimated blood loss was       minimal.      A small hiatal hernia was present.      The examined duodenum was normal.      The exam was otherwise without abnormality. Impression:               - Normal esophagus.                           - Erosive gastropathy with no stigmata of recent                             bleeding. Biopsied.                           - Non-bleeding gastric ulcers with no stigmata of                            bleeding. May be slightly improved from 09/2019.                           - Small hiatal hernia.                           - Normal examined duodenum.                           - The examination was otherwise normal. Moderate Sedation:      Not Applicable - Patient had care per Anesthesia. Recommendation:           - Patient has a contact number available for                            emergencies. The signs and symptoms of potential                            delayed complications were discussed with the                            patient. Return to normal activities tomorrow.                            Written discharge instructions were provided to the  patient.                           - Resume previous diet.                           - Continue present medications. Continue                            pantoprazole 40 mg BID and famotidine 20 mg BID.                           - Await pathology results.                           - Additional recommendations after reviewing the                            pathology results. Procedure Code(s):        --- Professional ---                           [phone], Esophagogastroduodenoscopy, flexible,                            transoral; with biopsy, single or multiple Diagnosis Code(s):        --- Professional ---                           K31.89, Other diseases of stomach and duodenum                           K25.9, Gastric ulcer, unspecified as acute or                            chronic, without hemorrhage or perforation                           K44.9, Diaphragmatic hernia without obstruction or                            gangrene CPT copyright 2019 [employer]. All rights reserved. The codes documented in this report are preliminary and upon coder review may  be revised  to meet current compliance requirements. Thornton Park MD, MD 04/27/2020 11:41:39 AM This report has been signed electronically. Number of Addenda: 0

## 2020-04-27 NOTE — H&P (Signed)
Referring Provider: No ref. provider found Primary Care Physician:  Ladell Pier, MD    IMPRESSION:  Persistent gastric antral ulcer on upper endoscopy 03/31/19, 12/20, 4/21    - biopsies negative for H pylori    - gastrin 148 on PPI 11/02/19 Intestinal metaplasia on EGD 4/21 GERD with a small hiatal hernia  Persistent gastric ulcer: She has completed several months of twice daily proton pump inhibitor therapy.  Follow-up EGD recommended to document healing of her unexplained gastric ulcer exclude concurrent etiologies such as syphilis, TB, CMV, IgG4 sclerosing disease.  She denied the use of NSAIDs. Gastrin level mildly elevated as expected on PPI therapy.     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 She has HTN, HL, IDA, GERD,LGSILwith positive HPV,formertobacco, HIV on BIKTARVY, cavitary lung lesion. History of substance abuse but has not used any drugs for 3 years. She has completed the Covid-19 vaccine series.   She has no new complaints or concerns at this time. Reports 100% adherence with medications.   Recent evaluation has included: - Labs 09/08/18: WBC 4.5, hgb 11.3, platelets 341, MCV 92.2, RDW 15.1 - Labs 03/01/2019: Normal CMP except for a calcium of 10.6, alk phos 532, total bilirubin 0.5, AST 51, ALT 41, albumin 4.6, IgG 1793, IgM 121, INR 1.0, ANA positive at 06-1278, ACE less than 5, AMA to 15.5 - MRI/MRCP 03/17/2019 showed cirrhosis without liver mass.  Spleen was normal.  No ascites. - Liver biopsy was performed 03/15/2019 showing PBC with features of overlapping autoimmune hepatitis.  Stage 2-3 out of 4 fibrosis. - Colonoscopy was performed 03/31/2019 given her history of ulcerative colitis.  She had pancolonic pseudopolyps and hemorrhoids.  Stool in the entire examined colon limited this from being a high quality screening colonoscopy.  Biopsies from the terminal  ileum and throughout the colon were completely normal. - EGD 03/31/19 showed a small hiatal hernia, 12 mm cratered gastric antral ulcer.  Esophageal biopsies showed reflux without eosinophilic esophagitis or Barrett's.  She had chronic inactive gastritis without H. pylori.  Duodenal biopsies were normal.  - EGD 06/17/19: 10 mm antral ulcer,  No portal hypertensive gastropathy. No esophageal or gastric varices. Gastric biopsies show active chronic gastritis with ulceration and prominent cytologic atypia, indefinite for dysplasia. There is moderate cytologic atypia with nuclear enlargement and hyperchromasia. Immunohistochemical stain for p53 shows focal overexpression but a definite clonal overexpression pattern is not seen. The changes are interpreted as indefinite for dysplasia but a reactive change may be favored due to presence of prominent acute inflammation.  - EGD 10/13/19: Two non-bleeding cratered gastric ulcers, the largest 71m, the smaller 175m Multiple antral erosions. Small hiatal hernia. Gastric biopsies with intestinal metaplasia and cytologic atypia. Intestinal metaplasia.  - Labs 11/02/19: IgG 1486, TB 0.6, AST 23, ALT 18, alk phos 202  Prior records from her evaluation with Dr. KaDeatra Inan 2016: She has a history of drug abuse, AIDS, hypertension and a cavitary lesion of the lung.  She was [facility] that [facility].  A CT scan in November, 2015, demonstrated thickening of the left colon.  She complains of chronic abdominal pain.  Stools are intermittently loose and she has had some limited rectal bleeding.  On May 16, 2014 hemoglobin was 8. CT scan in November, 2015 demonstrated  thickening of the left colon.  C. difficile toxin was negative.  She was apparently hospitalized in [facility] last month and was told she has ulcerative colitis but she can provide no details.  Patient claims have  chronic abdominal pain that is not controlled with Tylenol No. 3.  She's asking for "stronger meds". Before embarking on any workup we need to review her prior records.  She has been given pain medications through the clinic here in [place] and I told her that I will not prescribe any further pain medications.  The patient did not follow-up. It does not appear that the records from [facility] were ever located. I am unable to find them in [place].  Prior records from outside: Liver biopsy 04/20/14: chronic portal hepatitis pattern of injury with marked duct injury, consistent with PBC. Stage 2-3 of 4. Reviewed at [facility].  Random colon biopsies 12/16/2007: diffuse active chronic colitis, possibly UC Random colon biopsies 12/23/13: diffuse chronic active colitis compatible with idiopathic inflammatory bowel disorder;    Prior abdominal imaging:  Ultrasound 05/08/18:  1.3 cm solid appearing mass in the anterior aspect o the left lobe of the liver that has increased in size from 0.6 cm on 11/13/2017. This is concerning for a primary or metastatic neoplasm. A previously demonstrated 2nd possibly solid mass in the periphery of the right lobe of the liver, laterally, could not be visualized today. A 0.6 cm irregular hypoechoic mass in the right lobe of the liver and a 1.2 cm benign appearing cyst in the posterior aspect of the right lobe of the liver are thought to be cysts.  MRI Liver 05/30/18 with [facility]: 5 nonenhancing liver lesions compatible with cysts. One of these is in the left hepatic lobe and is a candidate structure for the lesion seen at ultrasound. At ultrasound the lesion had some faint internal echoes and the possibility of a faintly complex cyst is noted, but the lack of enhancement would seem to argue against malignancy. Scattered peripheral small foci of early arterial phase enhancement in the liver are likely from small vascular malformations. These resolve on later arterial  phase and subsequent imaging, without capsule or washout appearance to suggest hepatocellular carcinoma.  Reticular enhancement throughout the liver on arterial phase images favoring diffuse fibrosis and underlying cirrhosis.Aortic Atherosclerosis.  MRI/MRCP 03/17/2019 showed cirrhosis without liver mass.  Spleen was normal.  No ascites.  MRI 12/27/19: Very mild changes of cirrhosis. No lesions. Unchanged, small cysts in the liver and kidneys  Past Medical History:  Diagnosis Date  . Cavities 02/02/2015  . Chronic gastric ulcer   . GERD (gastroesophageal reflux disease) Dx 2014  . GI bleed   . HIV (human immunodeficiency virus infection) ([place]) Dx 2015  . Hyperlipidemia   . Hypertension Dx 2014  . Primary biliary cirrhosis ([place]) 07/03/2018  . Substance abuse ([place])    Quit 2008  . Ulcerative colitis [facility])     Past Surgical History:  Procedure Laterality Date  . ABDOMINAL HYSTERECTOMY  2001    done in [facility], patient unsure of indication  . COLONOSCOPY    . ESOPHAGOGASTRODUODENOSCOPY    . TEE WITHOUT CARDIOVERSION N/A 05/02/2014   Procedure: TRANSESOPHAGEAL ECHOCARDIOGRAM (TEE);  Surgeon: Larey Dresser, MD;  Location: [place];  Service: Cardiovascular;  Laterality: N/A;  . VIDEO BRONCHOSCOPY Bilateral 04/27/2014   Procedure: VIDEO BRONCHOSCOPY WITH FLUORO;  Surgeon: Rigoberto Noel, MD;  [facility];  Service: Cardiopulmonary;  Laterality: Bilateral;  Current Facility-Administered Medications  Medication Dose Route Frequency Provider Last Rate Last Admin  . 0.9 %  sodium chloride infusion   Intravenous Continuous Thornton Park, MD        Allergies as of 04/14/2020  . (No Known Allergies)    Family History  Problem Relation Age of Onset  . Cancer Father        ?  Marland Kitchen Alcoholism Father   . Diabetes Mother   . Stroke Mother   . Pancreatitis Sister   . Diabetes Sister   . Colon cancer Paternal Uncle 59  . Prostate cancer Brother   . Colon  cancer Brother   . Esophageal cancer Neg Hx   . Rectal cancer Neg Hx   . Stomach cancer Neg Hx     Social History   Socioeconomic History  . Marital status: Single    Spouse name: Not on file  . Number of children: Not on file  . Years of education: Not on file  . Highest education level: Not on file  Occupational History  . Not on file  Tobacco Use  . Smoking status: Former Smoker    Packs/day: 0.20    Years: 35.00    Pack years: 7.00    Types: Cigarettes  . Smokeless tobacco: Never Used  Vaping Use  . Vaping Use: Never used  Substance and Sexual Activity  . Alcohol use: No    Alcohol/week: 0.0 standard drinks  . Drug use: Yes    Types: Marijuana    Comment: previous cocaine use, marijuana on 12/29  . Sexual activity: Not Currently    Birth control/protection: Other-see comments, Surgical    Comment: offered condoms; 03/2019  Other Topics Concern  . Not on file  Social History Narrative  . Not on file   Social Determinants of Health   Financial Resource Strain:   . Difficulty of Paying Living Expenses: Not on file  Food Insecurity:   . Worried About Charity fundraiser in the Last Year: Not on file  . Ran Out of Food in the Last Year: Not on file  Transportation Needs:   . Lack of Transportation (Medical): Not on file  . Lack of Transportation (Non-Medical): Not on file  Physical Activity:   . Days of Exercise per Week: Not on file  . Minutes of Exercise per Session: Not on file  Stress:   . Feeling of Stress : Not on file  Social Connections:   . Frequency of Communication with Friends and Family: Not on file  . Frequency of Social Gatherings with Friends and Family: Not on file  . Attends Religious Services: Not on file  . Active Member of Clubs or Organizations: Not on file  . Attends Archivist Meetings: Not on file  . Marital Status: Not on file  Intimate Partner Violence:   . Fear of Current or Ex-Partner: Not on file  . Emotionally  Abused: Not on file  . Physically Abused: Not on file  . Sexually Abused: Not on file    Physical Exam: General:   Alert,  well-nourished, pleasant and cooperative in NAD.  Head:  Normocephalic and atraumatic. Eyes:  Sclera clear, no icterus.   Conjunctiva pink. Heart:  Regular rate and rhythm; no murmurs. Abdomen:  Thin, soft, nontender, nondistended, normal bowel sounds, no rebound or guarding. No hepatosplenomegaly.  No fluid wave. No inguinal or umbilical LAD. Neurologic:  Alert and  oriented x4;  grossly nonfocal. No asterixis or clonus.  Skin: No rash or bruise. No palmar erythema or spider angioma.  Psych:  Alert and cooperative. Tearful when speaking of her brother. Normal mood and affect.    Porsche Noguchi L. Tarri Glenn, MD, MPH 04/27/2020, 9:37 AM

## 2020-04-27 NOTE — Transfer of Care (Signed)
Immediate Anesthesia Transfer of Care Note  Patient: [name] [name]  Procedure(s) Performed: ESOPHAGOGASTRODUODENOSCOPY (EGD) WITH PROPOFOL (N/A ) BIOPSY  Patient Location: Endoscopy Unit  Anesthesia Type:MAC  Level of Consciousness: awake, alert  and oriented  Airway & Oxygen Therapy: Patient Spontanous Breathing and Patient connected to face mask oxygen  Post-op Assessment: Report given to RN and Post -op Vital signs reviewed and stable  Post vital signs: Reviewed and stable  Last Vitals:  Vitals Value Taken Time  BP 192/92 04/27/20 1120  Temp 36.7 C 04/27/20 1120  Pulse 75 04/27/20 1122  Resp 16 04/27/20 1122  SpO2 100 % 04/27/20 1122  Vitals shown include unvalidated device data.  Last Pain:  Vitals:   04/27/20 1120  TempSrc: Oral  PainSc: 0-No pain         Complications: No complications documented.

## 2020-04-28 ENCOUNTER — Other Ambulatory Visit: Payer: Self-pay

## 2020-05-01 ENCOUNTER — Encounter (HOSPITAL_COMMUNITY): Payer: Self-pay | Admitting: Gastroenterology

## 2020-05-01 ENCOUNTER — Other Ambulatory Visit: Payer: Self-pay | Admitting: Infectious Disease

## 2020-05-02 LAB — SURGICAL PATHOLOGY

## 2020-05-04 ENCOUNTER — Ambulatory Visit (INDEPENDENT_AMBULATORY_CARE_PROVIDER_SITE_OTHER): Payer: Medicare Other | Admitting: Infectious Disease

## 2020-05-04 ENCOUNTER — Other Ambulatory Visit: Payer: Self-pay | Admitting: Infectious Disease

## 2020-05-04 ENCOUNTER — Encounter: Payer: Self-pay | Admitting: Infectious Disease

## 2020-05-04 ENCOUNTER — Other Ambulatory Visit: Payer: Self-pay

## 2020-05-04 VITALS — BP 175/91 | HR 74 | Temp 98.7°F | Wt 128.0 lb

## 2020-05-04 DIAGNOSIS — I10 Essential (primary) hypertension: Secondary | ICD-10-CM | POA: Diagnosis not present

## 2020-05-04 DIAGNOSIS — Z23 Encounter for immunization: Secondary | ICD-10-CM

## 2020-05-04 DIAGNOSIS — R87622 Low grade squamous intraepithelial lesion on cytologic smear of vagina (LGSIL): Secondary | ICD-10-CM

## 2020-05-04 DIAGNOSIS — F4321 Adjustment disorder with depressed mood: Secondary | ICD-10-CM

## 2020-05-04 DIAGNOSIS — B20 Human immunodeficiency virus [HIV] disease: Secondary | ICD-10-CM | POA: Diagnosis not present

## 2020-05-04 DIAGNOSIS — E785 Hyperlipidemia, unspecified: Secondary | ICD-10-CM

## 2020-05-04 DIAGNOSIS — F17200 Nicotine dependence, unspecified, uncomplicated: Secondary | ICD-10-CM

## 2020-05-04 DIAGNOSIS — K5100 Ulcerative (chronic) pancolitis without complications: Secondary | ICD-10-CM

## 2020-05-04 DIAGNOSIS — F32A Depression, unspecified: Secondary | ICD-10-CM | POA: Insufficient documentation

## 2020-05-04 DIAGNOSIS — Z90710 Acquired absence of both cervix and uterus: Secondary | ICD-10-CM

## 2020-05-04 DIAGNOSIS — F329 Major depressive disorder, single episode, unspecified: Secondary | ICD-10-CM

## 2020-05-04 HISTORY — DX: Adjustment disorder with depressed mood: F43.21

## 2020-05-04 MED ORDER — BIKTARVY 50-200-25 MG PO TABS
1.0000 | ORAL_TABLET | Freq: Every day | ORAL | 11 refills | Status: DC
Start: 2020-05-04 — End: 2020-05-04

## 2020-05-04 MED ORDER — ESCITALOPRAM OXALATE 10 MG PO TABS: 5.0000 mg | ORAL_TABLET | Freq: Every day | ORAL | 11 refills | Status: DC

## 2020-05-04 MED FILL — ESCITALOPRAM 10 MG TABLET: 10 | 30 days supply | Qty: 15 | Fill #0

## 2020-05-04 MED FILL — BIKTARVY 50-200-25 MG TABS: 50-200-25 | 30 days supply | Qty: 30 | Fill #0

## 2020-05-04 NOTE — Progress Notes (Signed)
Chief complaint: Follow-up for HIV on medications w depressive ssx related to loss of 2 family members Subjective:    Patient ID: [name] [name], female    DOB: 12-20-1960, 59 y.o.   MRN: [id number]  HPI  This is a 59year-old African-American lady with HIV, ulcerative colitis newly diagnosed primary biliary cirrhosis, hypertension usually on Provencal to minimize drug drug interactions   She also has had multiple medical visits with GI, liver biopsy showing PBC, LGSIL on biopsy by Ob/Gyn.  She is on meds for UC as well which is currently well controlled she states.  She is sad with loss of her brother to sudden cardiac death and a sister in law who also died recently.  She would like rx with SSRI and counseling.    Past Medical History:  Diagnosis Date  . Cavities 02/02/2015  . Chronic gastric ulcer   . GERD (gastroesophageal reflux disease) Dx 2014  . GI bleed   . HIV (human immunodeficiency virus infection) ([place]) Dx 2015  . Hyperlipidemia   . Hypertension Dx 2014  . Primary biliary cirrhosis ([place]) 07/03/2018  . Substance abuse ([place])    Quit 2008  . Ulcerative colitis [facility])     Past Surgical History:  Procedure Laterality Date  . ABDOMINAL HYSTERECTOMY  2001    done in [facility], patient unsure of indication  . BIOPSY  04/27/2020   Procedure: BIOPSY;  Surgeon: Thornton Park, MD;  [facility];  Service: Gastroenterology;;  . COLONOSCOPY    . ESOPHAGOGASTRODUODENOSCOPY    . ESOPHAGOGASTRODUODENOSCOPY (EGD) WITH PROPOFOL N/A 04/27/2020   Procedure: ESOPHAGOGASTRODUODENOSCOPY (EGD) WITH PROPOFOL;  Surgeon: Thornton Park, MD;  [facility];  Service: Gastroenterology;  Laterality: N/A;  . TEE WITHOUT CARDIOVERSION N/A 05/02/2014   Procedure: TRANSESOPHAGEAL ECHOCARDIOGRAM (TEE);  Surgeon: Larey Dresser, MD;  Location: [facility];  Service: Cardiovascular;  Laterality: N/A;  . VIDEO BRONCHOSCOPY Bilateral 04/27/2014    Procedure: VIDEO BRONCHOSCOPY WITH FLUORO;  Surgeon: Rigoberto Noel, MD;  [facility];  Service: Cardiopulmonary;  Laterality: Bilateral;    Family History  Problem Relation Age of Onset  . Cancer Father        ?  Marland Kitchen Alcoholism Father   . Diabetes Mother   . Stroke Mother   . Pancreatitis Sister   . Diabetes Sister   . Colon cancer Paternal Uncle 37  . Prostate cancer Brother   . Colon cancer Brother   . Esophageal cancer Neg Hx   . Rectal cancer Neg Hx   . Stomach cancer Neg Hx       Social History   Socioeconomic History  . Marital status: Single    Spouse name: Not on file  . Number of children: Not on file  . Years of education: Not on file  . Highest education level: Not on file  Occupational History  . Not on file  Tobacco Use  . Smoking status: Former Smoker    Packs/day: 0.20    Years: 35.00    Pack years: 7.00    Types: Cigarettes  . Smokeless tobacco: Never Used  Vaping Use  . Vaping Use: Never used  Substance and Sexual Activity  . Alcohol use: No    Alcohol/week: 0.0 standard drinks  . Drug use: Yes    Types: Marijuana    Comment: previous cocaine use, marijuana on 12/29  . Sexual activity: Not Currently    Birth control/protection: Other-see comments, Surgical    Comment:  offered condoms; 03/2019  Other Topics Concern  . Not on file  Social History Narrative  . Not on file   Social Determinants of Health   Financial Resource Strain:   . Difficulty of Paying Living Expenses: Not on file  Food Insecurity:   . Worried About Charity fundraiser in the Last Year: Not on file  . Ran Out of Food in the Last Year: Not on file  Transportation Needs:   . Lack of Transportation (Medical): Not on file  . Lack of Transportation (Non-Medical): Not on file  Physical Activity:   . Days of Exercise per Week: Not on file  . Minutes of Exercise per Session: Not on file  Stress:   . Feeling of Stress : Not on file  Social Connections:   .  Frequency of Communication with Friends and Family: Not on file  . Frequency of Social Gatherings with Friends and Family: Not on file  . Attends Religious Services: Not on file  . Active Member of Clubs or Organizations: Not on file  . Attends Archivist Meetings: Not on file  . Marital Status: Not on file    No Known Allergies   Current Outpatient Medications:  .  amLODipine (NORVASC) 10 MG tablet, TAKE 1 TABLET BY MOUTH DAILY (Patient taking differently: Take 10 mg by mouth daily. ), Disp: 90 tablet, Rfl: 2 .  atorvastatin (LIPITOR) 10 MG tablet, Take 1 tablet (10 mg total) by mouth daily., Disp: 90 tablet, Rfl: 0 .  BIKTARVY 50-200-25 MG TABS tablet, TAKE 1 TABLET BY MOUTH DAILY., Disp: 30 tablet, Rfl: 4 .  budesonide (ENTOCORT EC) 3 MG 24 hr capsule, Take 3 capsules (9 mg total) by mouth daily. (Patient taking differently: Take 3-6 mg by mouth See admin instructions. Take 2 capsules (6 mg) by mouth in the morning & take 1 capsule (3 mg) by mouth in the afternoon.), Disp: 270 capsule, Rfl: 0 .  famotidine (PEPCID) 20 MG tablet, Take 1 tablet (20 mg total) by mouth 2 (two) times daily., Disp: 180 tablet, Rfl: 0 .  feeding supplement, ENSURE COMPLETE, (ENSURE COMPLETE) LIQD, Take 237 mLs by mouth 3 (three) times daily between meals., Disp: 237 mL, Rfl: 0 .  FEROSUL 325 (65 Fe) MG tablet, TAKE 1 TABLET BY MOUTH DAILY WITH BREAKFAST., Disp: 90 tablet, Rfl: 3 .  hyoscyamine (LEVSIN, ANASPAZ) 0.125 MG tablet, Take 1 tablet (0.125 mg total) by mouth every 6 (six) hours as needed., Disp: 120 tablet, Rfl: 0 .  lisinopril (ZESTRIL) 5 MG tablet, Take 1 tablet (5 mg total) by mouth daily., Disp: 90 tablet, Rfl: 0 .  pantoprazole (PROTONIX) 40 MG tablet, Take 1 tablet (40 mg total) by mouth daily., Disp: 90 tablet, Rfl: 0 .  sulfaSALAzine (AZULFIDINE) 500 MG tablet, Take 2 tablets (1,000 mg total) by mouth 2 (two) times daily., Disp: 360 tablet, Rfl: 0 .  ursodiol (ACTIGALL) 250 MG tablet,  Take 1 tablet (250 mg total) by mouth 2 (two) times daily., Disp: 180 tablet, Rfl: 0   Review of Systems  Constitutional: Negative for activity change, appetite change, chills, diaphoresis, fatigue and fever.  HENT: Negative for congestion, rhinorrhea, sinus pressure, sneezing, sore throat and trouble swallowing.   Eyes: Negative for photophobia and visual disturbance.  Respiratory: Negative for cough, shortness of breath, wheezing and stridor.   Cardiovascular: Negative for chest pain, palpitations and leg swelling.  Gastrointestinal: Negative for abdominal distention, abdominal pain, anal bleeding, blood in stool, constipation, diarrhea,  nausea and vomiting.  Genitourinary: Negative for difficulty urinating, dysuria, flank pain and hematuria.  Musculoskeletal: Negative for arthralgias, back pain, gait problem, joint swelling and myalgias.  Skin: Negative for color change, pallor, rash and wound.  Neurological: Negative for dizziness, tremors, weakness and light-headedness.  Hematological: Negative for adenopathy. Does not bruise/bleed easily.  Psychiatric/Behavioral: Positive for dysphoric mood. Negative for agitation, behavioral problems, confusion, decreased concentration, self-injury, sleep disturbance and suicidal ideas. The patient is nervous/anxious. The patient is not hyperactive.        Objective:   Physical Exam Vitals and nursing note reviewed.  Constitutional:      General: She is not in acute distress.    Appearance: She is well-developed. She is not diaphoretic.  HENT:     Head: Normocephalic and atraumatic.     Mouth/Throat:     Pharynx: No oropharyngeal exudate.  Eyes:     General: No scleral icterus. Cardiovascular:     Rate and Rhythm: Normal rate and regular rhythm.  Pulmonary:     Effort: Pulmonary effort is normal. No respiratory distress.     Breath sounds: No wheezing.  Abdominal:     General: There is no distension.     Palpations: Abdomen is soft.    Musculoskeletal:        General: No tenderness.     Cervical back: Normal range of motion and neck supple.  Skin:    General: Skin is warm and dry.     Coloration: Skin is not pale.     Findings: No erythema or rash.  Neurological:     Mental Status: She is alert and oriented to person, place, and time.     Cranial Nerves: Cranial nerve deficit: .25.     Motor: No abnormal muscle tone.     Coordination: Coordination normal.  Psychiatric:        Mood and Affect: Mood is depressed.        Behavior: Behavior normal.        Thought Content: Thought content normal.        Cognition and Memory: Cognition and memory normal.        Judgment: Judgment normal.           Assessment & Plan:   HIV:and renew  BIKTARVY   Hypertension :  Needs to followup with PCP and also recommend getting home monitor  Vitals:   05/04/20 1039  BP: (!) 175/91  Pulse: 74  Temp: 98.7 F (37.1 C)   UC: followed by GI for this and PBC  LGSIL: followed closely by Ob/gyn  Depression: start lexapro and should see counselor  I spent greater than 40 minutes with the patient including greater than 50% of time in face to face counsel of the patient re her depressive ssx, grief, HTN  and in coordination of her care.

## 2020-05-09 ENCOUNTER — Other Ambulatory Visit: Payer: Self-pay | Admitting: Gastroenterology

## 2020-05-16 ENCOUNTER — Ambulatory Visit (INDEPENDENT_AMBULATORY_CARE_PROVIDER_SITE_OTHER): Payer: Medicare Other | Admitting: Gastroenterology

## 2020-05-16 ENCOUNTER — Encounter: Payer: Self-pay | Admitting: Gastroenterology

## 2020-05-16 ENCOUNTER — Other Ambulatory Visit (INDEPENDENT_AMBULATORY_CARE_PROVIDER_SITE_OTHER): Payer: Medicare Other

## 2020-05-16 VITALS — BP 140/72 | HR 86 | Ht 68.0 in | Wt 125.4 lb

## 2020-05-16 DIAGNOSIS — K743 Primary biliary cirrhosis: Secondary | ICD-10-CM | POA: Diagnosis not present

## 2020-05-16 DIAGNOSIS — K754 Autoimmune hepatitis: Secondary | ICD-10-CM

## 2020-05-16 DIAGNOSIS — K31A29 Gastric intestinal metaplasia with dysplasia, unspecified: Secondary | ICD-10-CM | POA: Diagnosis not present

## 2020-05-16 DIAGNOSIS — K259 Gastric ulcer, unspecified as acute or chronic, without hemorrhage or perforation: Secondary | ICD-10-CM

## 2020-05-16 LAB — CBC
HCT: 36.6 % (ref 36.0–46.0)
Hemoglobin: 12.3 g/dL (ref 12.0–15.0)
MCHC: 33.6 g/dL (ref 30.0–36.0)
MCV: 93.8 fl (ref 78.0–100.0)
Platelets: 281 10*3/uL (ref 150.0–400.0)
RBC: 3.9 Mil/uL (ref 3.87–5.11)
RDW: 13.9 % (ref 11.5–15.5)
WBC: 5.7 10*3/uL (ref 4.0–10.5)

## 2020-05-16 LAB — COMPREHENSIVE METABOLIC PANEL
ALT: 24 U/L (ref 0–35)
AST: 24 U/L (ref 0–37)
Albumin: 4.4 g/dL (ref 3.5–5.2)
Alkaline Phosphatase: 204 U/L — ABNORMAL HIGH (ref 39–117)
BUN: 14 mg/dL (ref 6–23)
CO2: 29 mEq/L (ref 19–32)
Calcium: 9.9 mg/dL (ref 8.4–10.5)
Chloride: 106 mEq/L (ref 96–112)
Creatinine, Ser: 0.78 mg/dL (ref 0.40–1.20)
GFR: 82.08 mL/min (ref >60.00)
Glucose, Bld: 94 mg/dL (ref 70–99)
Potassium: 3.9 mEq/L (ref 3.5–5.1)
Sodium: 141 mEq/L (ref 135–145)
Total Bilirubin: 0.7 mg/dL (ref 0.2–1.2)
Total Protein: 8 g/dL (ref 6.0–8.3)

## 2020-05-16 LAB — FERRITIN: Ferritin: 45.7 ng/mL (ref 10.0–291.0)

## 2020-05-16 LAB — IRON: Iron: 112 ug/dL (ref 42–145)

## 2020-05-16 NOTE — H&P (View-Only) (Signed)
 Referring Provider: Johnson, [name] B, MD Primary Care Physician:  [name], [name] B, MD  Chief complaint: PBC   IMPRESSION:  Persistent gastric antral ulcer on upper endoscopy 03/31/19, 12/20, 4/21    - biopsies negative for H pylori    - gastrin 148 on PPI 11/02/19 Intestinal metaplasia on EGD 4/21 PBC with autoimmune hepatitis overlap with abnormal liver enzymes    -  Alk phos elevated dating back to 2015      - magnitude of elevation has increased in the last couple of years.          - Transaminases are mildly elevated. Total bilirubin is normal.     - Liver biopsy 04/20/14: chronic portal hepatitis with marked duct injury c/w PBC    - 2015: Stage 2-3 PBC of 4. Reviewed at [facility]    - Labs 03/01/2019: ANA 1:1280, IgG 1793, AMA 215.5, IgM 121    - Liver biopsy 03/15/2019 showed 2-3 fibrosis    - Abdominal imaging has suggested cirrhosis    - FIB-4 1.56 and APRI 0.450 do not support a diagnosis of cirrhosis    - Labs 11/02/19: IgM 1486, alk phos 202 GERD with a small hiatal hernia Quiescent ulcerative colitis    - Random colon biopsies 12/16/2007: diffuse active chronic colitis, possibly UC    - Random colon biopsies 12/23/13: diffuse chronic active colitis - idiopathic IBD    - Abnormal CT scan 04/2014: thickened left colon    - hospitalized for colitis in [place] 05/2014    - Colonoscopy 03/31/2019 shows pancolonic pseudopolyps but no active colitis Abnormal ultrasound and liver MRI    - CT a/p with contrast at [facility] 11/13/17:        - enteritis, mild gastritis, mild hepatomegaly, liver lesions    - ultrasound 05/08/18: 1.3 cm solid mass in the left liver lobe       - 0.6 cm irregular hypoechoic mass in the right liver lobe       - 1.2 cm benign appearing cyst in the right liver lobe    - MRI 05/30/18: suspected cirrhosis       - 5 small lesion in the liver, likely small cysts    - MRI/MRCP 03/17/2019: cirrhosis, no liver mass, normal spleen, no ascites    - MRI  12/27/19: Very mild changes of cirrhosis. No mass. Small cysts in the liver and kidneys HIV on BIKTARVY  Family history of stomach cancer (Father)  Persistent gastric ulcer with intestinal metaplasia and now dysplasia, family history of gastriccancer: No improved in gastric ulcer despite maximum medical therapy for many months. Gastric biopsies negative for syphilis, TB, CMV, IgG4 sclerosing disease.  She denied the use of NSAIDs. Gastrin level mildly elevated as expected on PPI therapy.  New dysplasia is worrisome for malignancy. EGD with EUS recommended followed by referral to general surgery.   Cirrhosis without history of decompensation: Labs recommended to monitor response to ursodeoxycholic acid and budesonide for PBC with autoimmune hepatitis overlap.  There is some fibrosis but no evidence on biopsy for advanced fibrosis.   PBC and autoimmune hepatitis: IgG has normalized and alk phos continues to normalize on ursodeoxycholic acid and budesonide. Continue to follow IgG levels.    Ulcerative colitis: Quiescent. No symptoms suggestive of active disease.  Abnormal MRI: Given her prior abnormal MRI and suspected underlying advanced fibrosis/early cirrhosis, will need high quality cross-sectional imaging every 6 months given the risk of liver cancer. Will trend AFP levels.      PLAN: - Continue to avoid all NSAIDs - Continue UDCA at a total daily dose of 13 to 15 mg/kg for 250 mg 2 tablets BID - Continue Budesonde 9 mg daily - Continue pantoprazole 40 mg twice daily and famotidine 20 mg BID - CMP, IgG, CBC, AFP, PT/INR - To follow-up on the gastric ulcers: iron, ferritin, gastritis - MRI with [facility] to follow-up on her abnormal 06/2020 - Reviewed strategies to love the liver including abstinence from alcohol - EGD with EUS with Dr. Jacobs (reviewed with him prior to this visit) - Referral to [facility] - Follow-up in 3 months, earlier as needed  Please see the "Patient  Instructions" section for addition details about the plan.   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 She has HTN, HL, IDA, GERD,LGSILwith positive HPV,formertobacco, HIV on BIKTARVY, cavitary lung lesion. History of substance abuse. Recent psychosocial stressors with the death of oldest brother and sister-in-law.   She has no new complaints or concerns at this time. Reports 100% adherence with medications.  No ongoing GI symptoms. Having one formed BM daily. No blood or mucous.  EGD 04/27/20 showed persistent gastric ulcers despite aggressive medical therapy. Biopsies  Ulcer margin biopsies show focal high-grade dysplasia arising in a chronic gastritis and intestinal metaplasia. No Helicobacter pylori.    Recent evaluation has included: - Labs 09/08/18: WBC 4.5, hgb 11.3, platelets 341, MCV 92.2, RDW 15.1 - Labs 03/01/2019: Normal CMP except for a calcium of 10.6, alk phos 532, total bilirubin 0.5, AST 51, ALT 41, albumin 4.6, IgG 1793, IgM 121, INR 1.0, ANA positive at 06-1278, ACE less than 5, AMA to 15.5 - MRI/MRCP 03/17/2019 showed cirrhosis without liver mass.  Spleen was normal.  No ascites. - Liver biopsy was performed 03/15/2019 showing PBC with features of overlapping autoimmune hepatitis.  Stage 2-3 out of 4 fibrosis. - Colonoscopy was performed 03/31/2019 given her history of ulcerative colitis.  She had pancolonic pseudopolyps and hemorrhoids.  Stool in the entire examined colon limited this from being a high quality screening colonoscopy.  Biopsies from the terminal ileum and throughout the colon were completely normal. - EGD 03/31/19 showed a small hiatal hernia, 12 mm cratered gastric antral ulcer.  Esophageal biopsies showed reflux without eosinophilic esophagitis or Barrett's.  She had chronic inactive gastritis without H. pylori.  Duodenal biopsies were normal.  - EGD 06/17/19: 10 mm antral ulcer,  No  portal hypertensive gastropathy. No esophageal or gastric varices. Gastric biopsies show active chronic gastritis with ulceration and prominent cytologic atypia, indefinite for dysplasia. There is moderate cytologic atypia with nuclear enlargement and hyperchromasia. Immunohistochemical stain for p53 shows focal overexpression but a definite clonal overexpression pattern is not seen. The changes are interpreted as indefinite for dysplasia but a reactive change may be favored due to presence of prominent acute inflammation.  - EGD 10/13/19: Two non-bleeding cratered gastric ulcers, the largest 15mm, the smaller 10mm. Multiple antral erosions. Small hiatal hernia. Gastric biopsies with intestinal metaplasia and cytologic atypia. Intestinal metaplasia.  - Labs 11/02/19: IgG 1486, TB 0.6, AST 23, ALT 18, alk phos 202   Prior records from outside: Liver biopsy 04/20/14: chronic portal hepatitis pattern of injury with marked duct injury, consistent with PBC. Stage 2-3 of 4. Reviewed at [facility].  Random colon biopsies 12/16/2007: diffuse active chronic colitis, possibly UC Random colon biopsies 12/23/13: diffuse chronic active colitis compatible with idiopathic inflammatory bowel disorder;        Past Medical History:  Diagnosis Date  . Cavities 02/02/2015  . Chronic gastric ulcer   . GERD (gastroesophageal reflux disease) Dx 2014  . GI bleed   . Grieving 05/04/2020  . HIV (human immunodeficiency virus infection) (HCC) Dx 2015  . Hyperlipidemia   . Hypertension Dx 2014  . Primary biliary cirrhosis (HCC) 07/03/2018  . Substance abuse (HCC)    Quit 2008  . Ulcerative colitis (HCC)     Past Surgical History:  Procedure Laterality Date  . ABDOMINAL HYSTERECTOMY  2001    done in [facility], patient unsure of indication  . BIOPSY  04/27/2020   Procedure: BIOPSY;  Surgeon: Kirke Breach, MD;  Location: [facility];  Service: Gastroenterology;;  . COLONOSCOPY    .  ESOPHAGOGASTRODUODENOSCOPY    . ESOPHAGOGASTRODUODENOSCOPY (EGD) WITH PROPOFOL N/A 04/27/2020   Procedure: ESOPHAGOGASTRODUODENOSCOPY (EGD) WITH PROPOFOL;  Surgeon: Dashel Goines, MD;  Location: [facility];  Service: Gastroenterology;  Laterality: N/A;  . TEE WITHOUT CARDIOVERSION N/A 05/02/2014   Procedure: TRANSESOPHAGEAL ECHOCARDIOGRAM (TEE);  Surgeon: Dalton S McLean, MD;  Location: [facility];  Service: Cardiovascular;  Laterality: N/A;  . VIDEO BRONCHOSCOPY Bilateral 04/27/2014   Procedure: VIDEO BRONCHOSCOPY WITH FLUORO;  Surgeon: Rakesh Alva V, MD;  Location: [facility];  Service: Cardiopulmonary;  Laterality: Bilateral;    Current Outpatient Medications  Medication Sig Dispense Refill  . amLODipine (NORVASC) 10 MG tablet TAKE 1 TABLET BY MOUTH DAILY (Patient taking differently: Take 10 mg by mouth daily. ) 90 tablet 2  . atorvastatin (LIPITOR) 10 MG tablet Take 1 tablet (10 mg total) by mouth daily. 90 tablet 0  . bictegravir-emtricitabine-tenofovir AF (BIKTARVY) 50-200-25 MG TABS tablet Take 1 tablet by mouth daily. 30 tablet 11  . budesonide (ENTOCORT EC) 3 MG 24 hr capsule Take 3 capsules (9 mg total) by mouth daily. (Patient taking differently: Take 3-6 mg by mouth See admin instructions. Take 2 capsules (6 mg) by mouth in the morning & take 1 capsule (3 mg) by mouth in the afternoon.) 270 capsule 0  . escitalopram (LEXAPRO) 10 MG tablet Take 0.5 tablets (5 mg total) by mouth daily. First month take half a tablet then one whole tablet daily 30 tablet 11  . famotidine (PEPCID) 20 MG tablet Take 1 tablet (20 mg total) by mouth 2 (two) times daily. 180 tablet 0  . FEROSUL 325 (65 Fe) MG tablet TAKE 1 TABLET BY MOUTH DAILY WITH BREAKFAST. 90 tablet 3  . hyoscyamine (LEVSIN, ANASPAZ) 0.125 MG tablet Take 1 tablet (0.125 mg total) by mouth every 6 (six) hours as needed. 120 tablet 0  . lisinopril (ZESTRIL) 5 MG tablet Take 1 tablet (5 mg total) by mouth daily. 90 tablet 0  .  pantoprazole (PROTONIX) 40 MG tablet Take 1 tablet (40 mg total) by mouth daily. 90 tablet 0  . sulfaSALAzine (AZULFIDINE) 500 MG tablet Take 2 tablets (1,000 mg total) by mouth 2 (two) times daily. 360 tablet 0  . ursodiol (ACTIGALL) 250 MG tablet TAKE 1 TABLET BY MOUTH  TWICE DAILY 180 tablet 3   No current facility-administered medications for this visit.    Allergies as of 05/16/2020  . (No Known Allergies)    Family History  Problem Relation Age of Onset  . Cancer Father        ?  . Alcoholism Father   . Diabetes Mother   . Stroke Mother   . Pancreatitis Sister   . Diabetes Sister   . Colon cancer Paternal   Uncle 55  . Prostate cancer Brother   . Colon cancer Brother   . Esophageal cancer Neg Hx   . Rectal cancer Neg Hx   . Stomach cancer Neg Hx     Social History   Socioeconomic History  . Marital status: Single    Spouse name: Not on file  . Number of children: Not on file  . Years of education: Not on file  . Highest education level: Not on file  Occupational History  . Not on file  Tobacco Use  . Smoking status: Former Smoker    Packs/day: 0.20    Years: 35.00    Pack years: 7.00    Types: Cigarettes  . Smokeless tobacco: Never Used  Vaping Use  . Vaping Use: Never used  Substance and Sexual Activity  . Alcohol use: No    Alcohol/week: 0.0 standard drinks  . Drug use: Yes    Types: Marijuana    Comment: previous cocaine use, marijuana on 12/29  . Sexual activity: Not Currently    Birth control/protection: Other-see comments, Surgical    Comment: offered condoms; 03/2019  Other Topics Concern  . Not on file  Social History Narrative  . Not on file   Social Determinants of Health   Financial Resource Strain:   . Difficulty of Paying Living Expenses: Not on file  Food Insecurity:   . Worried About Charity fundraiser in the Last Year: Not on file  . Ran Out of Food in the Last Year: Not on file  Transportation Needs:   . Lack of Transportation  (Medical): Not on file  . Lack of Transportation (Non-Medical): Not on file  Physical Activity:   . Days of Exercise per Week: Not on file  . Minutes of Exercise per Session: Not on file  Stress:   . Feeling of Stress : Not on file  Social Connections:   . Frequency of Communication with Friends and Family: Not on file  . Frequency of Social Gatherings with Friends and Family: Not on file  . Attends Religious Services: Not on file  . Active Member of Clubs or Organizations: Not on file  . Attends Archivist Meetings: Not on file  . Marital Status: Not on file  Intimate Partner Violence:   . Fear of Current or Ex-Partner: Not on file  . Emotionally Abused: Not on file  . Physically Abused: Not on file  . Sexually Abused: Not on file    Physical Exam: General:   Alert,  well-nourished, pleasant and cooperative in NAD.  Head:  Normocephalic and atraumatic. Eyes:  Sclera clear, no icterus.   Conjunctiva pink. Heart:  Regular rate and rhythm; no murmurs. Abdomen:  Thin, soft, nontender, nondistended, normal bowel sounds, no rebound or guarding. No hepatosplenomegaly.  No fluid wave. No inguinal or umbilical LAD. Neurologic:  Alert and  oriented x4;  grossly nonfocal. No asterixis or clonus. Skin: No rash or bruise. No palmar erythema or spider angioma.  Psych:  Alert and cooperative. Tearful when speaking of her brother. Normal mood and affect.    Rozelle Caudle L. Tarri Glenn, MD, MPH 05/16/2020, 10:10 AM

## 2020-05-16 NOTE — Progress Notes (Signed)
Referring Provider: Ladell Pier, MD Primary Care Physician:  Ladell Pier, MD  Chief complaint: PBC   IMPRESSION:  Persistent gastric antral ulcer on upper endoscopy 03/31/19, 12/20, 4/21    - biopsies negative for H pylori    - gastrin 148 on PPI 11/02/19 Intestinal metaplasia on EGD 4/21 PBC with autoimmune hepatitis overlap with abnormal liver enzymes    -  Alk phos elevated dating back to 2015      - magnitude of elevation has increased in the last couple of years.          - Transaminases are mildly elevated. Total bilirubin is normal.     - Liver biopsy 04/20/14: chronic portal hepatitis with marked duct injury c/w PBC    - 2015: Stage 2-3 PBC of 4. Reviewed at [place] 03/01/2019: ANA 1:1280, IgG 1793, AMA 215.5, IgM 121    - Liver biopsy 03/15/2019 showed 2-3 fibrosis    - Abdominal imaging has suggested cirrhosis    - FIB-4 1.56 and APRI 0.450 do not support a diagnosis of cirrhosis    - Labs 11/02/19: IgM 1486, alk phos 202 GERD with a small hiatal hernia Quiescent ulcerative colitis    - Random colon biopsies 12/16/2007: diffuse active chronic colitis, possibly UC    - Random colon biopsies 12/23/13: diffuse chronic active colitis - idiopathic IBD    - Abnormal CT scan 04/2014: thickened left colon    - hospitalized for colitis in [place] 05/2014    - Colonoscopy 03/31/2019 shows pancolonic pseudopolyps but no active colitis Abnormal ultrasound and liver MRI    - CT a/p with [facility] 11/13/17:        - enteritis, mild gastritis, mild hepatomegaly, liver lesions    - ultrasound 05/08/18: 1.3 cm solid mass in the left liver lobe       - 0.6 cm irregular hypoechoic mass in the right liver lobe       - 1.2 cm benign appearing cyst in the right liver lobe    - MRI 05/30/18: suspected cirrhosis       - 5 small lesion in the liver, likely small cysts    - MRI/MRCP 03/17/2019: cirrhosis, no liver mass, normal spleen, no ascites    - MRI  12/27/19: Very mild changes of cirrhosis. No mass. Small cysts in the liver and kidneys HIV on BIKTARVY  Family history of stomach cancer (Father)  Persistent gastric ulcer with intestinal metaplasia and now dysplasia, family history of gastriccancer: No improved in gastric ulcer despite maximum medical therapy for many months. Gastric biopsies negative for syphilis, TB, CMV, IgG4 sclerosing disease.  She denied the use of NSAIDs. Gastrin level mildly elevated as expected on PPI therapy.  New dysplasia is worrisome for malignancy. EGD with EUS recommended followed by referral to general surgery.   Cirrhosis without history of decompensation: Labs recommended to monitor response to ursodeoxycholic acid and budesonide for PBC with autoimmune hepatitis overlap.  There is some fibrosis but no evidence on biopsy for advanced fibrosis.   PBC and autoimmune hepatitis: IgG has normalized and alk phos continues to normalize on ursodeoxycholic acid and budesonide. Continue to follow IgG levels.    Ulcerative colitis: Quiescent. No symptoms suggestive of active disease.  Abnormal MRI: Given her prior abnormal MRI and suspected underlying advanced fibrosis/early cirrhosis, will need high quality cross-sectional imaging every 6 months given the risk of liver cancer. Will trend AFP levels.  PLAN: - Continue to avoid all NSAIDs - Continue UDCA at a total daily dose of 13 to 15 mg/kg for 250 mg 2 tablets BID - Continue Budesonde 9 mg daily - Continue pantoprazole 40 mg twice daily and famotidine 20 mg BID - CMP, IgG, CBC, AFP, PT/INR - To follow-up on the gastric ulcers: iron, ferritin, gastritis - MRI with [facility] to follow-up on her abnormal 06/2020 - Reviewed strategies to love the liver including abstinence from alcohol - EGD with EUS with Dr. Ardis Hughs (reviewed with him prior to this visit) - Referral to [facility] - Follow-up in 3 months, earlier as needed  Please see the "Patient  Instructions" section for addition details about the plan.   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 She has HTN, HL, IDA, GERD,LGSILwith positive HPV,formertobacco, HIV on BIKTARVY, cavitary lung lesion. History of substance abuse. Recent psychosocial stressors with the death of oldest brother and sister-in-law.   She has no new complaints or concerns at this time. Reports 100% adherence with medications.  No ongoing GI symptoms. Having one formed BM daily. No blood or mucous.  EGD 04/27/20 showed persistent gastric ulcers despite aggressive medical therapy. Biopsies  Ulcer margin biopsies show focal high-grade dysplasia arising in a chronic gastritis and intestinal metaplasia. No Helicobacter pylori.    Recent evaluation has included: - Labs 09/08/18: WBC 4.5, hgb 11.3, platelets 341, MCV 92.2, RDW 15.1 - Labs 03/01/2019: Normal CMP except for a calcium of 10.6, alk phos 532, total bilirubin 0.5, AST 51, ALT 41, albumin 4.6, IgG 1793, IgM 121, INR 1.0, ANA positive at 06-1278, ACE less than 5, AMA to 15.5 - MRI/MRCP 03/17/2019 showed cirrhosis without liver mass.  Spleen was normal.  No ascites. - Liver biopsy was performed 03/15/2019 showing PBC with features of overlapping autoimmune hepatitis.  Stage 2-3 out of 4 fibrosis. - Colonoscopy was performed 03/31/2019 given her history of ulcerative colitis.  She had pancolonic pseudopolyps and hemorrhoids.  Stool in the entire examined colon limited this from being a high quality screening colonoscopy.  Biopsies from the terminal ileum and throughout the colon were completely normal. - EGD 03/31/19 showed a small hiatal hernia, 12 mm cratered gastric antral ulcer.  Esophageal biopsies showed reflux without eosinophilic esophagitis or Barrett's.  She had chronic inactive gastritis without H. pylori.  Duodenal biopsies were normal.  - EGD 06/17/19: 10 mm antral ulcer,  No  portal hypertensive gastropathy. No esophageal or gastric varices. Gastric biopsies show active chronic gastritis with ulceration and prominent cytologic atypia, indefinite for dysplasia. There is moderate cytologic atypia with nuclear enlargement and hyperchromasia. Immunohistochemical stain for p53 shows focal overexpression but a definite clonal overexpression pattern is not seen. The changes are interpreted as indefinite for dysplasia but a reactive change may be favored due to presence of prominent acute inflammation.  - EGD 10/13/19: Two non-bleeding cratered gastric ulcers, the largest 49m, the smaller 139m Multiple antral erosions. Small hiatal hernia. Gastric biopsies with intestinal metaplasia and cytologic atypia. Intestinal metaplasia.  - Labs 11/02/19: IgG 1486, TB 0.6, AST 23, ALT 18, alk phos 202   Prior records from outside: Liver biopsy 04/20/14: chronic portal hepatitis pattern of injury with marked duct injury, consistent with PBC. Stage 2-3 of 4. [facility] Random colon biopsies 12/16/2007: diffuse active chronic colitis, possibly UC Random colon biopsies 12/23/13: diffuse chronic active colitis compatible with idiopathic inflammatory bowel disorder;  Past Medical History:  Diagnosis Date  . Cavities 02/02/2015  . Chronic gastric ulcer   . GERD (gastroesophageal reflux disease) Dx 2014  . GI bleed   . Grieving 05/04/2020  . HIV (human immunodeficiency virus infection) ([place]) Dx 2015  . Hyperlipidemia   . Hypertension Dx 2014  . Primary biliary cirrhosis ([place]) 07/03/2018  . Substance abuse ([place])    Quit 2008  . Ulcerative colitis [facility])     Past Surgical History:  Procedure Laterality Date  . ABDOMINAL HYSTERECTOMY  2001    done in [facility], patient unsure of indication  . BIOPSY  04/27/2020   Procedure: BIOPSY;  Surgeon: Thornton Park, MD;  Location: [facility];  Service: Gastroenterology;;  . COLONOSCOPY    .  ESOPHAGOGASTRODUODENOSCOPY    . ESOPHAGOGASTRODUODENOSCOPY (EGD) WITH PROPOFOL N/A 04/27/2020   Procedure: ESOPHAGOGASTRODUODENOSCOPY (EGD) WITH PROPOFOL;  Surgeon: Thornton Park, MD;  Location: [facility];  Service: Gastroenterology;  Laterality: N/A;  . TEE WITHOUT CARDIOVERSION N/A 05/02/2014   Procedure: TRANSESOPHAGEAL ECHOCARDIOGRAM (TEE);  Surgeon: Larey Dresser, MD;  Location: [place];  Service: Cardiovascular;  Laterality: N/A;  . VIDEO BRONCHOSCOPY Bilateral 04/27/2014   Procedure: VIDEO BRONCHOSCOPY WITH FLUORO;  Surgeon: Rigoberto Noel, MD;  Location: [facility];  Service: Cardiopulmonary;  Laterality: Bilateral;    Current Outpatient Medications  Medication Sig Dispense Refill  . amLODipine (NORVASC) 10 MG tablet TAKE 1 TABLET BY MOUTH DAILY (Patient taking differently: Take 10 mg by mouth daily. ) 90 tablet 2  . atorvastatin (LIPITOR) 10 MG tablet Take 1 tablet (10 mg total) by mouth daily. 90 tablet 0  . bictegravir-emtricitabine-tenofovir AF (BIKTARVY) 50-200-25 MG TABS tablet Take 1 tablet by mouth daily. 30 tablet 11  . budesonide (ENTOCORT EC) 3 MG 24 hr capsule Take 3 capsules (9 mg total) by mouth daily. (Patient taking differently: Take 3-6 mg by mouth See admin instructions. Take 2 capsules (6 mg) by mouth in the morning & take 1 capsule (3 mg) by mouth in the afternoon.) 270 capsule 0  . escitalopram (LEXAPRO) 10 MG tablet Take 0.5 tablets (5 mg total) by mouth daily. First month take half a tablet then one whole tablet daily 30 tablet 11  . famotidine (PEPCID) 20 MG tablet Take 1 tablet (20 mg total) by mouth 2 (two) times daily. 180 tablet 0  . FEROSUL 325 (65 Fe) MG tablet TAKE 1 TABLET BY MOUTH DAILY WITH BREAKFAST. 90 tablet 3  . hyoscyamine (LEVSIN, ANASPAZ) 0.125 MG tablet Take 1 tablet (0.125 mg total) by mouth every 6 (six) hours as needed. 120 tablet 0  . lisinopril (ZESTRIL) 5 MG tablet Take 1 tablet (5 mg total) by mouth daily. 90 tablet 0  .  pantoprazole (PROTONIX) 40 MG tablet Take 1 tablet (40 mg total) by mouth daily. 90 tablet 0  . sulfaSALAzine (AZULFIDINE) 500 MG tablet Take 2 tablets (1,000 mg total) by mouth 2 (two) times daily. 360 tablet 0  . ursodiol (ACTIGALL) 250 MG tablet TAKE 1 TABLET BY MOUTH  TWICE DAILY 180 tablet 3   No current facility-administered medications for this visit.    Allergies as of 05/16/2020  . (No Known Allergies)    Family History  Problem Relation Age of Onset  . Cancer Father        ?  Marland Kitchen Alcoholism Father   . Diabetes Mother   . Stroke Mother   . Pancreatitis Sister   . Diabetes Sister   . Colon cancer Paternal  Uncle 55  . Prostate cancer Brother   . Colon cancer Brother   . Esophageal cancer Neg Hx   . Rectal cancer Neg Hx   . Stomach cancer Neg Hx     Social History   Socioeconomic History  . Marital status: Single    Spouse name: Not on file  . Number of children: Not on file  . Years of education: Not on file  . Highest education level: Not on file  Occupational History  . Not on file  Tobacco Use  . Smoking status: Former Smoker    Packs/day: 0.20    Years: 35.00    Pack years: 7.00    Types: Cigarettes  . Smokeless tobacco: Never Used  Vaping Use  . Vaping Use: Never used  Substance and Sexual Activity  . Alcohol use: No    Alcohol/week: 0.0 standard drinks  . Drug use: Yes    Types: Marijuana    Comment: previous cocaine use, marijuana on 12/29  . Sexual activity: Not Currently    Birth control/protection: Other-see comments, Surgical    Comment: offered condoms; 03/2019  Other Topics Concern  . Not on file  Social History Narrative  . Not on file   Social Determinants of Health   Financial Resource Strain:   . Difficulty of Paying Living Expenses: Not on file  Food Insecurity:   . Worried About Charity fundraiser in the Last Year: Not on file  . Ran Out of Food in the Last Year: Not on file  Transportation Needs:   . Lack of Transportation  (Medical): Not on file  . Lack of Transportation (Non-Medical): Not on file  Physical Activity:   . Days of Exercise per Week: Not on file  . Minutes of Exercise per Session: Not on file  Stress:   . Feeling of Stress : Not on file  Social Connections:   . Frequency of Communication with Friends and Family: Not on file  . Frequency of Social Gatherings with Friends and Family: Not on file  . Attends Religious Services: Not on file  . Active Member of Clubs or Organizations: Not on file  . Attends Archivist Meetings: Not on file  . Marital Status: Not on file  Intimate Partner Violence:   . Fear of Current or Ex-Partner: Not on file  . Emotionally Abused: Not on file  . Physically Abused: Not on file  . Sexually Abused: Not on file    Physical Exam: General:   Alert,  well-nourished, pleasant and cooperative in NAD.  Head:  Normocephalic and atraumatic. Eyes:  Sclera clear, no icterus.   Conjunctiva pink. Heart:  Regular rate and rhythm; no murmurs. Abdomen:  Thin, soft, nontender, nondistended, normal bowel sounds, no rebound or guarding. No hepatosplenomegaly.  No fluid wave. No inguinal or umbilical LAD. Neurologic:  Alert and  oriented x4;  grossly nonfocal. No asterixis or clonus. Skin: No rash or bruise. No palmar erythema or spider angioma.  Psych:  Alert and cooperative. Tearful when speaking of her brother. Normal mood and affect.    Jachob Mcclean L. Tarri Glenn, MD, MPH 05/16/2020, 10:10 AM

## 2020-05-16 NOTE — Patient Instructions (Addendum)
I have not changed your medications today.  Continue to avoid all NSAIDs.   I have recommended some labs today to follow-up on your liver and the stomach ulcers.  LABS: Your provider has requested that you go to the basement level for lab work before leaving today. Press "B" on the elevator. The lab is located at the first door on the left as you exit the elevator.  HEALTHCARE LAWS AND MY CHART RESULTS: Due to recent changes in healthcare laws, you may see the results of your imaging and laboratory studies on MyChart before your provider has had a chance to review them.  We understand that in some cases there may be results that are confusing or concerning to you. Not all laboratory results come back in the same time frame and the provider may be waiting for multiple results in order to interpret others.  Please give [place] 48 hours in order for your provider to thoroughly review all the results before contacting the office for clarification of your results.   We will schedule an upper endoscopy with endoscopic ultrasound to further evaluate the ulcers because they aren't getting better despite medicines. I would like for you to see a surgeon after the ultrasound to see if you might need surgery for the ulcers.  You are due an MRI on January to follow-up on your liver.  You have been scheduled for an MRI at [facility] (1st Floor) on 06/21/20. Your appointment time is 11am. Please arrive at 10:30am for registration purposes. Please make certain not to have anything to eat or drink 4 hours prior to your test. In addition, if you have any metal in your body, have a pacemaker or defibrillator, please be sure to let your ordering physician know. This test typically takes 45 minutes to 1 hour to complete. Should you need to reschedule, please call [phone] to do so.  Continue to love your liver. It is important to keep your liver as healthy as possible and avoid anything that can further damage  your liver. Here are some important things you should do. - Don't drink too much alcohol. How much is too much remains controversial, but it's probably best to avoid alcohol completely. - Make sure that none of your medications, herbs, and supplements are toxic to the liver; you can crosscheck your list with [employer] (an online resource).  Even acetaminophen (the generic ingredient in Tylenol and some cold medicines) may be harmful if you take too much for too long, especially if you have liver disease or drink alcohol heavily. - Get vaccinated to protect against liver viruses hepatitis A and B if you have not already done so.  - There is growing evidence that coffee protects the liver.   We are referring you to [facility] for abnormal cells of the intestine. You will receive a call from their office regarding your appointment date and time.  If you are age 61 or younger, your body mass index should be between 19-25. Your Body mass index is 19.07 kg/m. If this is out of the aformentioned range listed, please consider follow up with your Primary Care Provider.   Thank you for trusting me with your gastrointestinal care!    Thornton Park, MD, MPH

## 2020-05-17 ENCOUNTER — Encounter: Payer: Self-pay | Admitting: Gastroenterology

## 2020-05-17 LAB — IGG: IgG (Immunoglobin G), Serum: 1561 mg/dL — ABNORMAL HIGH (ref 600–1540)

## 2020-05-17 LAB — AFP TUMOR MARKER: AFP-Tumor Marker: 2.5 ng/mL

## 2020-05-18 ENCOUNTER — Other Ambulatory Visit: Payer: Self-pay | Admitting: Gastroenterology

## 2020-05-18 ENCOUNTER — Telehealth: Payer: Self-pay

## 2020-05-18 NOTE — Telephone Encounter (Signed)
FAXED DOCUMENTS  Provider: [facility]  Document: Referral Other records: Imaging, op reports, path reports, ins cards, demos and office notes  All above information has been faxed successfully to [facility]. Documents and fax confirmation have been placed in the faxed file for future reference.

## 2020-05-19 ENCOUNTER — Ambulatory Visit: Payer: Medicare Other | Attending: Internal Medicine | Admitting: Internal Medicine

## 2020-05-19 ENCOUNTER — Other Ambulatory Visit: Payer: Self-pay

## 2020-05-19 ENCOUNTER — Encounter: Payer: Self-pay | Admitting: Internal Medicine

## 2020-05-19 VITALS — BP 138/66 | HR 75 | Temp 97.5°F | Ht 68.0 in | Wt 125.0 lb

## 2020-05-19 DIAGNOSIS — Z8742 Personal history of other diseases of the female genital tract: Secondary | ICD-10-CM

## 2020-05-19 DIAGNOSIS — K257 Chronic gastric ulcer without hemorrhage or perforation: Secondary | ICD-10-CM

## 2020-05-19 DIAGNOSIS — B20 Human immunodeficiency virus [HIV] disease: Secondary | ICD-10-CM | POA: Diagnosis not present

## 2020-05-19 DIAGNOSIS — E782 Mixed hyperlipidemia: Secondary | ICD-10-CM

## 2020-05-19 DIAGNOSIS — I10 Essential (primary) hypertension: Secondary | ICD-10-CM

## 2020-05-19 DIAGNOSIS — K743 Primary biliary cirrhosis: Secondary | ICD-10-CM

## 2020-05-19 DIAGNOSIS — F325 Major depressive disorder, single episode, in full remission: Secondary | ICD-10-CM

## 2020-05-19 LAB — EXTRA SPECIMEN

## 2020-05-19 LAB — GASTRIN

## 2020-05-19 NOTE — Patient Instructions (Signed)
I have referred you to the gynecologist for repeat PAP smear for monitoring.

## 2020-05-19 NOTE — Progress Notes (Signed)
Patient ID: [name] [name], female    DOB: Dec 02, 1960  MRN: [id number]  CC: Follow-up (Pt. is here to follow up on hypertension. )   Subjective: [name] [name] is a 59 y.o. female who presents for chronic ds management Her concerns today include:  Patient with history of HTN, HL, IDA, GERD,LGSILwith positive HPV(neg colpo 07/10/2017),formertobacco, HIV, ulcerative colitis and calf cramps  Depression: had few deaths in family since last visit.  Also a lot of stresses related with her health.  Doing well on Lexapro though and feels that her depression is in remission.  HYPERTENSION Currently taking: see medication list.  She is on amlodipine and lisinopril Med Adherence: [x]  Yes -but did not take as yet for the a.m. Medication side effects: [x]  Yes    []  No Adherence with salt restriction: [x]  Yes    []  No Home Monitoring?: [x]  Yes    []  No Monitoring Frequency:  Every day Home BP results range:  Gives range 130s/50-70s SOB? []  Yes    [x]  No Chest Pain?: []  Yes    [x]  No Leg swelling?: []  Yes    [x]  No Headaches?: []  Yes    [x]  No Dizziness? []  Yes    [x]  No Comments:   HL:  Tolerating Lipitor.  Most recent chemistry revealed normal AST and ALT.  She still has elevation in alkaline phosphatase  PBC/Gastric ulcer/UC/autoimm hep: Followed by Dr. Tarri Glenn.  She has slow healing gastric ulcer which is of concern.  GI plans to do another endoscopy with EUS and then refer her to a general surgeon.  Her ulcerative colitis is quiescent. Patient denies any abdominal pain.  Her weight has remained stable.  Moving her bowels okay.  HIV: Followed by ID.  Levels undetectable.  Compliant with meds.  History of abnormal Pap:  Had  colpo by Dr. Rip Harbour 04/2019 due to Pap showing LGSIL with HPV.  This colpo revealed LGSIL.  He spoke with GYN/Onc.  They recommend repeat Pap smear with HPV in 1 year.  Patient Active Problem List   Diagnosis Date Noted   Depression 05/04/2020   Grieving  05/04/2020   Chronic gastric ulcer with hemorrhage    Peptic ulcer disease 06/28/2019   History of colposcopy with cervical biopsy 06/28/2019   Primary biliary cirrhosis ([place]) 07/03/2018   LGSIL Pap smear of vagina 06/13/2017   Immunization due 04/07/2017   Calf cramp 12/17/2016   HIV disease ([place]) 02/02/2015   Current smoker 10/27/2014   Hyperlipidemia 08/24/2014   Vitamin D deficiency 08/24/2014   S/P hysterectomy 08/23/2014   Underweight 08/23/2014   Ulcerative colitis ([place]) 05/27/2014   HTN (hypertension) 05/27/2014   Anemia of chronic disease 05/27/2014   Insomnia 05/27/2014   Dental caries 05/10/2014   Cavitary lesion of lung 04/25/2014     Current Outpatient Medications on File Prior to Visit  Medication Sig Dispense Refill   amLODipine (NORVASC) 10 MG tablet TAKE 1 TABLET BY MOUTH DAILY (Patient taking differently: Take 10 mg by mouth daily. ) 90 tablet 2   atorvastatin (LIPITOR) 10 MG tablet Take 1 tablet (10 mg total) by mouth daily. 90 tablet 0   bictegravir-emtricitabine-tenofovir AF (BIKTARVY) 50-200-25 MG TABS tablet Take 1 tablet by mouth daily. 30 tablet 11   budesonide (ENTOCORT EC) 3 MG 24 hr capsule Take 3 capsules (9 mg total) by mouth daily. (Patient taking differently: Take 3-6 mg by mouth See admin instructions. Take 2 capsules (6 mg) by mouth in the morning &  take 1 capsule (3 mg) by mouth in the afternoon.) 270 capsule 0   escitalopram (LEXAPRO) 10 MG tablet Take 0.5 tablets (5 mg total) by mouth daily. First month take half a tablet then one whole tablet daily 30 tablet 11   famotidine (PEPCID) 20 MG tablet TAKE 1 TABLET BY MOUTH  TWICE DAILY 180 tablet 3   FEROSUL 325 (65 Fe) MG tablet TAKE 1 TABLET BY MOUTH DAILY WITH BREAKFAST. 90 tablet 3   hyoscyamine (LEVSIN, ANASPAZ) 0.125 MG tablet Take 1 tablet (0.125 mg total) by mouth every 6 (six) hours as needed. 120 tablet 0   lisinopril (ZESTRIL) 5 MG tablet Take 1 tablet (5 mg  total) by mouth daily. 90 tablet 0   pantoprazole (PROTONIX) 40 MG tablet TAKE 1 TABLET BY MOUTH  DAILY 90 tablet 3   sulfaSALAzine (AZULFIDINE) 500 MG tablet Take 2 tablets (1,000 mg total) by mouth 2 (two) times daily. 360 tablet 0   ursodiol (ACTIGALL) 250 MG tablet TAKE 1 TABLET BY MOUTH  TWICE DAILY 180 tablet 3   [DISCONTINUED] famotidine (PEPCID) 20 MG tablet Take 1 tablet (20 mg total) by mouth 2 (two) times daily. 180 tablet 0   [DISCONTINUED] pantoprazole (PROTONIX) 40 MG tablet Take 1 tablet (40 mg total) by mouth daily. 90 tablet 0   [DISCONTINUED] ursodiol (ACTIGALL) 250 MG tablet TAKE 1 TABLET (250 MG TOTAL) BY MOUTH 2 (TWO) TIMES DAILY. 180 tablet 0   [DISCONTINUED] ursodiol (ACTIGALL) 250 MG tablet Take 1 tablet (250 mg total) by mouth 2 (two) times daily. 180 tablet 0   No current facility-administered medications on file prior to visit.    No Known Allergies  Social History   Socioeconomic History   Marital status: Single    Spouse name: Not on file   Number of children: Not on file   Years of education: Not on file   Highest education level: Not on file  Occupational History   Not on file  Tobacco Use   Smoking status: Former Smoker    Packs/day: 0.20    Years: 35.00    Pack years: 7.00    Types: Cigarettes   Smokeless tobacco: Never Used  Vaping Use   Vaping Use: Never used  Substance and Sexual Activity   Alcohol use: No    Alcohol/week: 0.0 standard drinks   Drug use: Yes    Types: Marijuana    Comment: previous cocaine use, marijuana on 12/29   Sexual activity: Not Currently    Birth control/protection: Other-see comments, Surgical    Comment: offered condoms; 03/2019  Other Topics Concern   Not on file  Social History Narrative   Not on file   Social Determinants of Health   Financial Resource Strain:    Difficulty of Paying Living Expenses: Not on file  Food Insecurity:    Worried About Charity fundraiser in the Last  Year: Not on file   [employer] of Food in the Last Year: Not on file  Transportation Needs:    Lack of Transportation (Medical): Not on file   Lack of Transportation (Non-Medical): Not on file  Physical Activity:    Days of Exercise per Week: Not on file   Minutes of Exercise per Session: Not on file  Stress:    Feeling of Stress : Not on file  Social Connections:    Frequency of Communication with Friends and Family: Not on file   Frequency of Social Gatherings with Friends and Family:  Not on file   Attends Religious Services: Not on file   Active Member of Clubs or Organizations: Not on file   Attends Club or Organization Meetings: Not on file   Marital Status: Not on file  Intimate Partner Violence:    Fear of Current or Ex-Partner: Not on file   Emotionally Abused: Not on file   Physically Abused: Not on file   Sexually Abused: Not on file    Family History  Problem Relation Age of Onset   Cancer Father        ?   Alcoholism Father    Diabetes Mother    Stroke Mother    Pancreatitis Sister    Diabetes Sister    Colon cancer Paternal Uncle 42   Prostate cancer Brother    Colon cancer Brother    Esophageal cancer Neg Hx    Rectal cancer Neg Hx    Stomach cancer Neg Hx     Past Surgical History:  Procedure Laterality Date   ABDOMINAL HYSTERECTOMY  2001    done in [facility], patient unsure of indication   BIOPSY  04/27/2020   Procedure: BIOPSY;  Surgeon: Thornton Park, MD;  Location: [facility];  Service: Gastroenterology;;   COLONOSCOPY     ESOPHAGOGASTRODUODENOSCOPY     ESOPHAGOGASTRODUODENOSCOPY (EGD) WITH PROPOFOL N/A 04/27/2020   Procedure: ESOPHAGOGASTRODUODENOSCOPY (EGD) WITH PROPOFOL;  Surgeon: Thornton Park, MD;  Location: [facility];  Service: Gastroenterology;  Laterality: N/A;   TEE WITHOUT CARDIOVERSION N/A 05/02/2014   Procedure: TRANSESOPHAGEAL ECHOCARDIOGRAM (TEE);  Surgeon: Larey Dresser, MD;   Location: [facility];  Service: Cardiovascular;  Laterality: N/A;   VIDEO BRONCHOSCOPY Bilateral 04/27/2014   Procedure: VIDEO BRONCHOSCOPY WITH FLUORO;  Surgeon: Rigoberto Noel, MD;  Location: [facility];  Service: Cardiopulmonary;  Laterality: Bilateral;    ROS: Review of Systems Negative except as stated above  PHYSICAL EXAM: BP 138/66 (BP Location: Left Arm, Patient Position: Sitting, Cuff Size: Small)    Pulse 75    Temp (!) 97.5 F (36.4 C) (Temporal)    Ht 5' 8"  (1.727 m)    Wt 125 lb (56.7 kg)    SpO2 100%    BMI 19.01 kg/m   Wt Readings from Last 3 Encounters:  05/19/20 125 lb (56.7 kg)  05/16/20 125 lb 6.4 oz (56.9 kg)  05/04/20 128 lb (58.1 kg)  BP142/70  Physical Exam  General appearance - alert, well appearing, and in no distress Mental status - normal mood, behavior, speech, dress, motor activity, and thought processes Eyes - pupils equal and reactive, extraocular eye movements intact Mouth - mucous membranes moist, pharynx normal without lesions Neck - supple, no significant adenopathy Chest - clear to auscultation, no wheezes, rales or rhonchi, symmetric air entry Heart -regular rate and rhythm.  Soft systolic ejection murmur heard at the apex and the left upper sternal border.  No gallops.  Abdomen: Normal bowel sounds, soft nontender no palpable masses.  No organomegaly  Extremities -no lower extremity edema. Depression screen [facility] 2/9 05/19/2020 05/04/2020 06/28/2019  Decreased Interest 0 0 0  Down, Depressed, Hopeless 0 1 0  PHQ - 2 Score 0 1 0  Altered sleeping 0 - -  Tired, decreased energy 0 - -  Change in appetite 0 - -  Feeling bad or failure about yourself  0 - -  Trouble concentrating 0 - -  Moving slowly or fidgety/restless 0 - -  Suicidal thoughts 0 - -  PHQ-9 Score 0 - -  Some recent data might be hidden    CMP Latest Ref Rng & Units 05/16/2020 04/20/2020 11/02/2019  Glucose 70 - 99 mg/dL 94 100(H) 104(H)  BUN 6 - 23 mg/dL 14 16 13     Creatinine 0.40 - 1.20 mg/dL 0.78 0.81 0.78  Sodium 135 - 145 mEq/L 141 140 140  Potassium 3.5 - 5.1 mEq/L 3.9 4.1 3.7  Chloride 96 - 112 mEq/L 106 107 106  CO2 19 - 32 mEq/L 29 24 30   Calcium 8.4 - 10.5 mg/dL 9.9 9.9 9.5  Total Protein 6.0 - 8.3 g/dL 8.0 7.0 8.0  Total Bilirubin 0.2 - 1.2 mg/dL 0.7 0.4 0.6  Alkaline Phos 39 - 117 U/L 204(H) - 202(H)  AST 0 - 37 U/L 24 46(H) 23  ALT 0 - 35 U/L 24 36(H) 18   Lipid Panel     Component Value Date/Time   CHOL 223 (H) 04/20/2020 1042   TRIG 94 04/20/2020 1042   HDL 66 04/20/2020 1042   CHOLHDL 3.4 04/20/2020 1042   VLDL 16 10/28/2016 1006   LDLCALC 137 (H) 04/20/2020 1042    CBC    Component Value Date/Time   WBC 5.7 05/16/2020 1131   RBC 3.90 05/16/2020 1131   HGB 12.3 05/16/2020 1131   HGB 11.8 11/07/2017 1002   HCT 36.6 05/16/2020 1131   HCT 34.7 11/07/2017 1002   PLT 281.0 05/16/2020 1131   PLT 350 11/07/2017 1002   MCV 93.8 05/16/2020 1131   MCV 93 11/07/2017 1002   MCH 31.5 04/20/2020 1042   MCHC 33.6 05/16/2020 1131   RDW 13.9 05/16/2020 1131   RDW 16.6 (H) 11/07/2017 1002   LYMPHSABS 2,053 04/20/2020 1042   MONOABS 0.4 03/01/2019 1005   EOSABS 147 04/20/2020 1042   BASOSABS 29 04/20/2020 1042    ASSESSMENT AND PLAN: 1. Essential hypertension Not at goal but patient has not taken medication as yet for the morning.  She will continue current medications and low-salt diet  2. Mixed hyperlipidemia Not at goal based on last lipid profile.  However she was out off atorvastatin and refill given the end of October.  Encourage healthy eating habits  3. History of abnormal cervical Pap smear We will have her follow-up with Dr. Rip Harbour for repeat screening - Ambulatory referral to Gynecology  4. HIV disease ([place]) Followed by ID.  Encouraged her to remain compliant with medications.  5. Primary biliary cirrhosis ([place]) 6. Chronic gastric ulcer without hemorrhage and without perforation Followed by GI.  7.  Depression, major, in remission ([place]) Continue Lexapro    Patient was given the opportunity to ask questions.  Patient verbalized understanding of the plan and was able to repeat key elements of the plan.   Orders Placed This Encounter  Procedures   Ambulatory referral to Gynecology     Requested Prescriptions    No prescriptions requested or ordered in this encounter    Return in about 4 months (around 09/17/2020).  Karle Plumber, MD, FACP

## 2020-05-22 ENCOUNTER — Other Ambulatory Visit: Payer: Medicare Other

## 2020-05-23 NOTE — Telephone Encounter (Signed)
Called to f/u on status of referral. LVM with Lilia Pro - Referral Coordinator, asking that she provide a status update re: this pt referral and appt.

## 2020-05-24 ENCOUNTER — Ambulatory Visit (HOSPITAL_COMMUNITY): Payer: Medicare Other

## 2020-05-25 ENCOUNTER — Other Ambulatory Visit (INDEPENDENT_AMBULATORY_CARE_PROVIDER_SITE_OTHER): Payer: Medicare Other

## 2020-05-25 DIAGNOSIS — K754 Autoimmune hepatitis: Secondary | ICD-10-CM | POA: Diagnosis not present

## 2020-05-25 DIAGNOSIS — K743 Primary biliary cirrhosis: Secondary | ICD-10-CM | POA: Diagnosis not present

## 2020-05-25 DIAGNOSIS — K259 Gastric ulcer, unspecified as acute or chronic, without hemorrhage or perforation: Secondary | ICD-10-CM | POA: Diagnosis not present

## 2020-05-25 DIAGNOSIS — K31A29 Gastric intestinal metaplasia with dysplasia, unspecified: Secondary | ICD-10-CM

## 2020-05-25 LAB — CBC WITH DIFFERENTIAL/PLATELET
Basophils Absolute: 0 10*3/uL (ref 0.0–0.1)
Basophils Relative: 0.4 % (ref 0.0–3.0)
Eosinophils Absolute: 0.1 10*3/uL (ref 0.0–0.7)
Eosinophils Relative: 2.2 % (ref 0.0–5.0)
HCT: 37.1 % (ref 36.0–46.0)
Hemoglobin: 12.4 g/dL (ref 12.0–15.0)
Lymphocytes Relative: 39.7 % (ref 12.0–46.0)
Lymphs Abs: 1.8 10*3/uL (ref 0.7–4.0)
MCHC: 33.4 g/dL (ref 30.0–36.0)
MCV: 94.3 fl (ref 78.0–100.0)
Monocytes Absolute: 0.3 10*3/uL (ref 0.1–1.0)
Monocytes Relative: 5.9 % (ref 3.0–12.0)
Neutro Abs: 2.3 10*3/uL (ref 1.4–7.7)
Neutrophils Relative %: 51.8 % (ref 43.0–77.0)
Platelets: 269 10*3/uL (ref 150.0–400.0)
RBC: 3.93 Mil/uL (ref 3.87–5.11)
RDW: 13.7 % (ref 11.5–15.5)
WBC: 4.5 10*3/uL (ref 4.0–10.5)

## 2020-05-25 LAB — COMPREHENSIVE METABOLIC PANEL
ALT: 22 U/L (ref 0–35)
AST: 21 U/L (ref 0–37)
Albumin: 4.3 g/dL (ref 3.5–5.2)
Alkaline Phosphatase: 182 U/L — ABNORMAL HIGH (ref 39–117)
BUN: 12 mg/dL (ref 6–23)
CO2: 31 mEq/L (ref 19–32)
Calcium: 9.4 mg/dL (ref 8.4–10.5)
Chloride: 104 mEq/L (ref 96–112)
Creatinine, Ser: 0.85 mg/dL (ref 0.40–1.20)
GFR: 74.03 mL/min (ref >60.00)
Glucose, Bld: 147 mg/dL — ABNORMAL HIGH (ref 70–99)
Potassium: 4.3 mEq/L (ref 3.5–5.1)
Sodium: 141 mEq/L (ref 135–145)
Total Bilirubin: 0.6 mg/dL (ref 0.2–1.2)
Total Protein: 7.8 g/dL (ref 6.0–8.3)

## 2020-05-25 LAB — PROTIME-INR
INR: 1.1 ratio — ABNORMAL HIGH (ref 0.8–1.0)
Prothrombin Time: 11.9 s (ref 9.6–13.1)

## 2020-05-29 LAB — GASTRIN: Gastrin: 176 pg/mL — ABNORMAL HIGH (ref <=100)

## 2020-05-29 LAB — IGG: IgG (Immunoglobin G), Serum: 1427 mg/dL (ref 600–1540)

## 2020-06-01 ENCOUNTER — Other Ambulatory Visit: Payer: Self-pay | Admitting: Gastroenterology

## 2020-06-01 ENCOUNTER — Ambulatory Visit: Payer: Medicare Other

## 2020-06-01 DIAGNOSIS — K5190 Ulcerative colitis, unspecified, without complications: Secondary | ICD-10-CM

## 2020-06-01 MED FILL — ESCITALOPRAM 10 MG TABLET: 10 | 30 days supply | Qty: 30 | Fill #1

## 2020-06-01 MED FILL — BIKTARVY 50-200-25 MG TABS: 50-200-25 | 30 days supply | Qty: 30 | Fill #1

## 2020-06-05 ENCOUNTER — Other Ambulatory Visit: Payer: Self-pay

## 2020-06-05 ENCOUNTER — Encounter (HOSPITAL_COMMUNITY): Payer: Self-pay | Admitting: Gastroenterology

## 2020-06-07 ENCOUNTER — Other Ambulatory Visit: Payer: Self-pay | Admitting: Internal Medicine

## 2020-06-07 DIAGNOSIS — E785 Hyperlipidemia, unspecified: Secondary | ICD-10-CM

## 2020-06-07 DIAGNOSIS — I10 Essential (primary) hypertension: Secondary | ICD-10-CM

## 2020-06-12 ENCOUNTER — Other Ambulatory Visit (HOSPITAL_COMMUNITY)
Admission: RE | Admit: 2020-06-12 | Discharge: 2020-06-12 | Disposition: A | Discharge status: Home / Self Care | Payer: Medicare Other | Source: Ambulatory Visit | Attending: Gastroenterology | Admitting: Gastroenterology

## 2020-06-12 DIAGNOSIS — Z01812 Encounter for preprocedural laboratory examination: Secondary | ICD-10-CM | POA: Insufficient documentation

## 2020-06-12 DIAGNOSIS — Z20822 Contact with and (suspected) exposure to covid-19: Secondary | ICD-10-CM | POA: Insufficient documentation

## 2020-06-12 LAB — SARS CORONAVIRUS 2 (TAT 6-24 HRS): SARS Coronavirus 2: NEGATIVE

## 2020-06-13 MED FILL — LISINOPRIL 5 MG TABLET: 5 | 90 days supply | Qty: 90 | Fill #3

## 2020-06-14 MED FILL — sulfaSALAzine 500 MG TABS: 500 | 30 days supply | Qty: 120 | Fill #3

## 2020-06-15 ENCOUNTER — Other Ambulatory Visit: Payer: Self-pay

## 2020-06-15 ENCOUNTER — Ambulatory Visit (HOSPITAL_COMMUNITY)
Admission: RE | Admit: 2020-06-15 | Discharge: 2020-06-15 | Disposition: A | Discharge status: Home / Self Care | Payer: Medicare Other | Attending: Gastroenterology | Admitting: Gastroenterology

## 2020-06-15 ENCOUNTER — Ambulatory Visit (HOSPITAL_COMMUNITY): Payer: Medicare Other | Admitting: Certified Registered Nurse Anesthetist

## 2020-06-15 ENCOUNTER — Encounter (HOSPITAL_COMMUNITY)
Admission: RE | Disposition: A | Discharge status: Home / Self Care | Payer: Self-pay | Source: Home / Self Care | Attending: Gastroenterology

## 2020-06-15 ENCOUNTER — Telehealth: Payer: Self-pay

## 2020-06-15 ENCOUNTER — Encounter (HOSPITAL_COMMUNITY): Payer: Self-pay | Admitting: Gastroenterology

## 2020-06-15 DIAGNOSIS — Z79899 Other long term (current) drug therapy: Secondary | ICD-10-CM | POA: Diagnosis not present

## 2020-06-15 DIAGNOSIS — Z7952 Long term (current) use of systemic steroids: Secondary | ICD-10-CM | POA: Diagnosis not present

## 2020-06-15 DIAGNOSIS — K259 Gastric ulcer, unspecified as acute or chronic, without hemorrhage or perforation: Secondary | ICD-10-CM | POA: Diagnosis not present

## 2020-06-15 DIAGNOSIS — E785 Hyperlipidemia, unspecified: Secondary | ICD-10-CM | POA: Diagnosis not present

## 2020-06-15 DIAGNOSIS — K31A29 Gastric intestinal metaplasia with dysplasia, unspecified: Secondary | ICD-10-CM

## 2020-06-15 DIAGNOSIS — Z87891 Personal history of nicotine dependence: Secondary | ICD-10-CM | POA: Diagnosis not present

## 2020-06-15 DIAGNOSIS — K3189 Other diseases of stomach and duodenum: Secondary | ICD-10-CM | POA: Insufficient documentation

## 2020-06-15 DIAGNOSIS — E559 Vitamin D deficiency, unspecified: Secondary | ICD-10-CM | POA: Diagnosis not present

## 2020-06-15 DIAGNOSIS — K2970 Gastritis, unspecified, without bleeding: Secondary | ICD-10-CM | POA: Insufficient documentation

## 2020-06-15 DIAGNOSIS — Z8379 Family history of other diseases of the digestive system: Secondary | ICD-10-CM | POA: Diagnosis not present

## 2020-06-15 DIAGNOSIS — K754 Autoimmune hepatitis: Secondary | ICD-10-CM

## 2020-06-15 DIAGNOSIS — K31A0 Gastric intestinal metaplasia, unspecified: Secondary | ICD-10-CM | POA: Diagnosis not present

## 2020-06-15 DIAGNOSIS — I10 Essential (primary) hypertension: Secondary | ICD-10-CM | POA: Diagnosis not present

## 2020-06-15 DIAGNOSIS — K743 Primary biliary cirrhosis: Secondary | ICD-10-CM

## 2020-06-15 DIAGNOSIS — R857 Abnormal histological findings in specimens from digestive organs and abdominal cavity: Secondary | ICD-10-CM | POA: Diagnosis not present

## 2020-06-15 DIAGNOSIS — K254 Chronic or unspecified gastric ulcer with hemorrhage: Secondary | ICD-10-CM | POA: Diagnosis not present

## 2020-06-15 HISTORY — PX: BIOPSY: SHX5522

## 2020-06-15 HISTORY — PX: UPPER ESOPHAGEAL ENDOSCOPIC ULTRASOUND (EUS): SHX6562

## 2020-06-15 HISTORY — PX: ESOPHAGOGASTRODUODENOSCOPY (EGD) WITH PROPOFOL: SHX5813

## 2020-06-15 SURGERY — ESOPHAGOGASTRODUODENOSCOPY (EGD) WITH PROPOFOL
Anesthesia: Monitor Anesthesia Care

## 2020-06-15 MED ORDER — LIDOCAINE 2% (20 MG/ML) 5 ML SYRINGE
INTRAMUSCULAR | Status: DC | PRN
  Administered 2020-06-15: 80 mg via INTRAVENOUS

## 2020-06-15 MED ORDER — PROPOFOL 500 MG/50ML IV EMUL
INTRAVENOUS | Status: AC
  Filled 2020-06-15: qty 50

## 2020-06-15 MED ORDER — SODIUM CHLORIDE 0.9 % IV SOLN: INTRAVENOUS | Status: DC

## 2020-06-15 MED ORDER — PROPOFOL 500 MG/50ML IV EMUL
INTRAVENOUS | Status: DC | PRN
  Administered 2020-06-15: 125 ug/kg/min via INTRAVENOUS

## 2020-06-15 MED ORDER — PROPOFOL 10 MG/ML IV BOLUS
INTRAVENOUS | Status: DC | PRN
  Administered 2020-06-15 (×3): 10 mg via INTRAVENOUS
  Administered 2020-06-15: 30 mg via INTRAVENOUS
  Administered 2020-06-15: 10 mg via INTRAVENOUS
  Administered 2020-06-15: 20 mg via INTRAVENOUS

## 2020-06-15 MED ORDER — LACTATED RINGERS IV SOLN: INTRAVENOUS | Status: DC

## 2020-06-15 SURGICAL SUPPLY — 14 items

## 2020-06-15 NOTE — Discharge Instructions (Signed)
Upper Endoscopy, Adult, Care After  This sheet gives you information about how to care for yourself after your procedure. Your health care provider may also give you more specific instructions. If you have problems or questions, contact your health care provider.  What can I expect after the procedure?  After the procedure, it is common to have:  · A sore throat.  · Mild stomach pain or discomfort.  · Bloating.  · Nausea.  Follow these instructions at home:    · Follow instructions from your health care provider about what to eat or drink after your procedure.  · Return to your normal activities as told by your health care provider. Ask your health care provider what activities are safe for you.  · Take over-the-counter and prescription medicines only as told by your health care provider.  · Do not drive for 24 hours if you were given a sedative during your procedure.  · Keep all follow-up visits as told by your health care provider. This is important.  Contact a health care provider if you have:  · A sore throat that lasts longer than one day.  · Trouble swallowing.  Get help right away if:  · You vomit blood or your vomit looks like coffee grounds.  · You have:  ? A fever.  ? Bloody, black, or tarry stools.  ? A severe sore throat or you cannot swallow.  ? Difficulty breathing.  ? Severe pain in your chest or abdomen.  Summary  · After the procedure, it is common to have a sore throat, mild stomach discomfort, bloating, and nausea.  · Do not drive for 24 hours if you were given a sedative during the procedure.  · Follow instructions from your health care provider about what to eat or drink after your procedure.  · Return to your normal activities as told by your health care provider.  This information is not intended to replace advice given to you by your health care provider. Make sure you discuss any questions you have with your health care provider.  Document Revised: 11/25/2017 Document Reviewed:  11/03/2017  Elsevier Patient Education © 2020 [employer]vier Inc[employer]

## 2020-06-15 NOTE — Telephone Encounter (Signed)
-----   Message from Milus Banister, MD sent at 06/15/2020  9:18 AM EST ----- Joelene Millin, See full report in epic.  I'm also concerned about this non-healing ulcer.  I think distal gastrectomy is safest next move and we'll begin referral.  Await final path.  - Non healing antral ulcer noted originally 03/2019.  She has avoided NSAIDs strictly, there is no evidence of H. pylori and the ulcer persists despite BID PPI.  This examination does not show an obvious underlying tumor or any evidence of linitis plastica however I am still concerned that this may represent a neoplastic growth especially given focal HGD on recent EGD. If this is neoplastic it is early stage (T1 or early T2).  I repeated extensive mucosal biopsies.  I recommend referral to [place] to consider distal gastrectomy.  Our office will begin that process.   Annye Forrey, She needs referral to [facility] [place] for non-healing gastric ulcer, consider distal gastrectomy.    Thanks

## 2020-06-15 NOTE — Anesthesia Postprocedure Evaluation (Signed)
Anesthesia Post Note  Patient: Latarshia Jersey Butrum  Procedure(s) Performed: ESOPHAGOGASTRODUODENOSCOPY (EGD) WITH PROPOFOL (N/A ) UPPER ESOPHAGEAL ENDOSCOPIC ULTRASOUND (EUS) (N/A ) BIOPSY     Patient location during evaluation: Endoscopy Anesthesia Type: MAC Level of consciousness: awake and alert Pain management: pain level controlled Vital Signs Assessment: post-procedure vital signs reviewed and stable Respiratory status: spontaneous breathing, nonlabored ventilation and respiratory function stable Cardiovascular status: blood pressure returned to baseline and stable Postop Assessment: no apparent nausea or vomiting Anesthetic complications: no   No complications documented.  Last Vitals:  Vitals:   06/15/20 0910 06/15/20 0920  BP: 132/68 (!) 147/69  Pulse: 76 69  Resp: 16 15  Temp:    SpO2: 100% 99%    Last Pain:  Vitals:   06/15/20 0920  TempSrc:   PainSc: 0-No pain                 Lidia Collum

## 2020-06-15 NOTE — Transfer of Care (Signed)
Immediate Anesthesia Transfer of Care Note  Patient: [name] [name]  Procedure(s) Performed: ESOPHAGOGASTRODUODENOSCOPY (EGD) WITH PROPOFOL (N/A ) UPPER ESOPHAGEAL ENDOSCOPIC ULTRASOUND (EUS) (N/A ) BIOPSY  Patient Location: Endoscopy Unit  Anesthesia Type:MAC  Level of Consciousness: awake, alert , oriented and patient cooperative  Airway & Oxygen Therapy: Patient Spontanous Breathing and Patient connected to face mask  Post-op Assessment: Report given to RN and Post -op Vital signs reviewed and stable  Post vital signs: Reviewed and stable  Last Vitals:  Vitals Value Taken Time  BP    Temp    Pulse 86 06/15/20 0903  Resp    SpO2 99 % 06/15/20 0903  Vitals shown include unvalidated device data.  Last Pain:  Vitals:   06/15/20 0734  TempSrc: Oral  PainSc: 5          Complications: No complications documented.

## 2020-06-15 NOTE — Telephone Encounter (Signed)
The referral has been made and records faxed.

## 2020-06-15 NOTE — Anesthesia Preprocedure Evaluation (Signed)
Anesthesia Evaluation  Patient identified by MRN, date of birth, ID band Patient awake    Reviewed: Allergy & Precautions, NPO status , Patient's Chart, lab work & pertinent test results  History of Anesthesia Complications Negative for: history of anesthetic complications  Airway Mallampati: II  TM Distance: >3 FB Neck ROM: Full    Dental   Pulmonary neg pulmonary ROS, former smoker,    Pulmonary exam normal        Cardiovascular hypertension, Pt. on medications Normal cardiovascular exam     Neuro/Psych Depression negative neurological ROS     GI/Hepatic PUD, GERD  ,(+) Cirrhosis     substance abuse  , PBC UC   Endo/Other  negative endocrine ROS  Renal/GU negative Renal ROS  negative genitourinary   Musculoskeletal negative musculoskeletal ROS (+)   Abdominal   Peds  Hematology  (+) HIV,   Anesthesia Other Findings   Reproductive/Obstetrics                             Anesthesia Physical Anesthesia Plan  ASA: III  Anesthesia Plan: MAC   Post-op Pain Management:    Induction: Intravenous  PONV Risk Score and Plan: 2 and Propofol infusion, TIVA and Treatment may vary due to age or medical condition  Airway Management Planned: Natural Airway, Nasal Cannula and Simple Face Mask  Additional Equipment: None  Intra-op Plan:   Post-operative Plan:   Informed Consent: I have reviewed the patients History and Physical, chart, labs and discussed the procedure including the risks, benefits and alternatives for the proposed anesthesia with the patient or authorized representative who has indicated his/her understanding and acceptance.       Plan Discussed with:   Anesthesia Plan Comments:         Anesthesia Quick Evaluation

## 2020-06-15 NOTE — Op Note (Signed)
[facility] Patient Name: [name] [name] Procedure Date: 06/15/2020 MRN: [id number] Attending MD: Milus Banister , MD Date of Birth: Dec 13, 1960 CSN: [id number] Age: 59 Admit Type: Outpatient Procedure:                Upper EUS Indications:              Chronic antral ulcer since 03/2019: EGD 03/2019                            path showed goblet cell metaplasia, H. pylori                            negative; EGD 05/2019 path showed marked chronic                            active gastritis, neg for H. pylori, + cytologic                            atypia but indefinite for dysplasia; EGD 09/2019                            cytologic atypica with IM without dysplasia; EGD                            04/2020 negative for H. pylori, + for focal HGD in                            IM Providers:                Milus Banister, MD, Benay Pillow, RN, Laverda Sorenson, Technician, Laureen Abrahams, Dellie Catholic Referring MD:             Thornton Park, MD Medicines:                Monitored Anesthesia Care Complications:            No immediate complications. Estimated Blood Loss:     Estimated blood loss: none. Procedure:                Pre-Anesthesia Assessment:                           - Prior to the procedure, a History and Physical                            was performed, and patient medications and                            allergies were reviewed. The patient's tolerance of                            previous anesthesia was also reviewed. The risks  and benefits of the procedure and the sedation                            options and risks were discussed with the patient.                            All questions were answered, and informed consent                            was obtained. Prior Anticoagulants: The patient has                            taken no previous anticoagulant or antiplatelet                             agents. ASA Grade Assessment: II - A patient with                            mild systemic disease. After reviewing the risks                            and benefits, the patient was deemed in                            satisfactory condition to undergo the procedure.                           After obtaining informed consent, the endoscope was                            passed under direct vision. Throughout the                            procedure, the patient's blood pressure, pulse, and                            oxygen saturations were monitored continuously. The                            [id number] ([id number]) Olympus was introduced through                            the mouth, and advanced to the second part of                            duodenum. The upper EUS was accomplished without                            difficulty. The patient tolerated the procedure                            well. Scope In: Scope Out: Findings:      ENDOSCOPIC FINDING: :      1. Persistent antral  gastric ulcer that is clean based, 6-31m across       with associated puckered adjacent gastric mucosa. This is not clearly       neoplastic appearing. Following EUS evaluation I used forceps to       extensively biospy the ulcer site.      2. UGI tract was otherwise normal.      EUS findings:      1. The antral gastric ulcer described above correlated with       non-specifically thickened mucosa and deep mucosa layers. There was NOT       a clear mass or tumor. If this is a neoplastic process it is T1 or early       T2. The nearby gastric was was not irregularly thickened. I did not get       the impression of a linitis plastica process.      2. No perigastric adenopathy.      3. Limited views of the liver, pancreas, spleen, portal and splenic       vessels were all normal. Impression:               - Non healing antral ulcer noted originally                            03/2019. She has avoided NSAIDs  strictly, there is                            no evidence of H. pylori and the ulcer persists                            despite BID PPI. This examination does not show an                            obvious underlying tumor or any evidence of linitis                            plastica however I am still concerned that this may                            represent a neoplastic growth especially given                            focal HGD on recent EGD. If this is neoplastic it                            is early stage (T1 or early T2). I repeated                            extensive mucosal biopsies. I recommend referral to                            CToastto consider distal gastrectomy. Our                            office will begin that process. Moderate Sedation:  Not Applicable - Patient had care per Anesthesia. Recommendation:           - Discharge patient to home (ambulatory). Procedure Code(s):        --- Professional ---                           [phone], Esophagogastroduodenoscopy, flexible,                            transoral; with biopsy, single or multiple Diagnosis Code(s):        --- Professional ---                           K29.70, Gastritis, unspecified, without bleeding                           K31.89, Other diseases of stomach and duodenum CPT copyright 2019 [employer]. All rights reserved. The codes documented in this report are preliminary and upon coder review may  be revised to meet current compliance requirements. Milus Banister, MD 06/15/2020 9:17:56 AM This report has been signed electronically. Number of Addenda: 0

## 2020-06-15 NOTE — Interval H&P Note (Signed)
History and Physical Interval Note:  06/15/2020 7:24 AM  [name] [name]  has presented today for surgery, with the diagnosis of Gastric ulcer without hemorrhage or perforation.  The various methods of treatment have been discussed with the patient and family. After consideration of risks, benefits and other options for treatment, the patient has consented to  Procedure(s): ESOPHAGOGASTRODUODENOSCOPY (EGD) WITH PROPOFOL (N/A) UPPER ESOPHAGEAL ENDOSCOPIC ULTRASOUND (EUS) (N/A) as a surgical intervention.  The patient's history has been reviewed, patient examined, no change in status, stable for surgery.  I have reviewed the patient's chart and labs.  Questions were answered to the patient's satisfaction.     Milus Banister

## 2020-06-16 ENCOUNTER — Encounter (HOSPITAL_COMMUNITY): Payer: Self-pay | Admitting: Gastroenterology

## 2020-06-19 ENCOUNTER — Other Ambulatory Visit: Payer: Self-pay

## 2020-06-20 NOTE — Telephone Encounter (Signed)
Called to f/u on status of referral. States pt is scheduled for NP appt with Dr. Michaelle Birks on 07/05/20 @ 1:30pm.

## 2020-06-21 ENCOUNTER — Ambulatory Visit (HOSPITAL_COMMUNITY): Admission: RE | Admit: 2020-06-21 | Payer: Medicare Other | Source: Ambulatory Visit

## 2020-06-21 LAB — SURGICAL PATHOLOGY

## 2020-06-28 MED FILL — BIKTARVY 50-200-25 MG TABS: 50-200-25 | 30 days supply | Qty: 30 | Fill #2

## 2020-06-28 MED FILL — ESCITALOPRAM 10 MG TABLET: 10 | 30 days supply | Qty: 30 | Fill #2

## 2020-07-05 ENCOUNTER — Encounter: Payer: Medicare Other | Admitting: Obstetrics & Gynecology

## 2020-07-07 DIAGNOSIS — F17210 Nicotine dependence, cigarettes, uncomplicated: Secondary | ICD-10-CM | POA: Diagnosis not present

## 2020-07-10 ENCOUNTER — Other Ambulatory Visit: Payer: Self-pay | Admitting: Gastroenterology

## 2020-07-12 ENCOUNTER — Other Ambulatory Visit: Payer: Self-pay

## 2020-07-12 DIAGNOSIS — K743 Primary biliary cirrhosis: Secondary | ICD-10-CM

## 2020-07-24 MED FILL — BIKTARVY 50-200-25 MG TABS: 50-200-25 | 30 days supply | Qty: 30 | Fill #3

## 2020-07-24 MED FILL — ESCITALOPRAM 10 MG TABLET: 10 | 30 days supply | Qty: 30 | Fill #3

## 2020-07-26 ENCOUNTER — Ambulatory Visit (HOSPITAL_COMMUNITY): Payer: Medicare Other

## 2020-07-31 ENCOUNTER — Ambulatory Visit (HOSPITAL_COMMUNITY)
Admission: RE | Admit: 2020-07-31 | Discharge: 2020-07-31 | Disposition: A | Discharge status: Home / Self Care | Payer: Medicare Other | Source: Ambulatory Visit | Attending: Gastroenterology | Admitting: Gastroenterology

## 2020-07-31 ENCOUNTER — Other Ambulatory Visit: Payer: Self-pay

## 2020-07-31 DIAGNOSIS — K7460 Unspecified cirrhosis of liver: Secondary | ICD-10-CM | POA: Diagnosis not present

## 2020-07-31 DIAGNOSIS — N281 Cyst of kidney, acquired: Secondary | ICD-10-CM | POA: Diagnosis not present

## 2020-07-31 DIAGNOSIS — K743 Primary biliary cirrhosis: Secondary | ICD-10-CM | POA: Diagnosis not present

## 2020-08-17 ENCOUNTER — Telehealth: Payer: Self-pay | Admitting: Internal Medicine

## 2020-08-17 NOTE — Telephone Encounter (Signed)
Called patient and LVM advising patient that her upcoming appt for her medicare wellness visit had been cancelled due to Dr. Wynetta Emery not being in the office and only working virtually. Apologized for the late notice and advised patient to call [phone] to schedule.   If patient calls back she needs a medicare wellness visit with Dr. Wynetta Emery placed in a physical slot.

## 2020-08-21 ENCOUNTER — Ambulatory Visit: Payer: Medicare Other | Admitting: Pharmacist

## 2020-09-06 ENCOUNTER — Other Ambulatory Visit: Payer: Self-pay | Admitting: Internal Medicine

## 2020-09-06 DIAGNOSIS — I10 Essential (primary) hypertension: Secondary | ICD-10-CM

## 2020-09-06 MED FILL — BIKTARVY 50-200-25 MG TABS: 50-200-25 | 30 days supply | Qty: 30 | Fill #4

## 2020-09-06 NOTE — Telephone Encounter (Signed)
Requested medication (s) are due for refill today: Yes  Requested medication (s) are on the active medication list: Yes  Last refill:  06/28/19  Future visit scheduled: No  Notes to clinic:  Unable to refill per protocol, Rx expired.      Requested Prescriptions  Pending Prescriptions Disp Refills   amLODipine (NORVASC) 10 MG tablet [Pharmacy Med Name: AMLODIPINE BESYLATE 10 MG T 10 Tablet] 90 tablet 2    Sig: TAKE 1 TABLET BY MOUTH ONCE A DAY      Cardiovascular:  Calcium Channel Blockers Failed - 09/06/2020  4:40 PM      Failed - Last BP in normal range    BP Readings from Last 1 Encounters:  06/15/20 (!) 147/69          Passed - Valid encounter within last 6 months    Recent Outpatient Visits           3 months ago Essential hypertension   [place] Ladell Pier, MD   1 year ago Essential hypertension   [place] Ladell Pier, MD   1 year ago Essential hypertension   [place] Ladell Pier, MD   1 year ago Pap smear for cervical cancer screening   [place] Ladell Pier, MD   2 years ago Primary biliary cirrhosis [facility])   [facility] Ladell Pier, MD

## 2020-09-07 ENCOUNTER — Other Ambulatory Visit: Payer: Self-pay | Admitting: Internal Medicine

## 2020-09-07 MED FILL — AMLODIPINE BESYLATE 10 MG T: 10 | 90 days supply | Qty: 90 | Fill #0

## 2020-09-14 ENCOUNTER — Other Ambulatory Visit (HOSPITAL_COMMUNITY): Payer: Self-pay

## 2020-09-27 ENCOUNTER — Other Ambulatory Visit: Payer: Self-pay | Admitting: Internal Medicine

## 2020-09-27 ENCOUNTER — Other Ambulatory Visit (HOSPITAL_COMMUNITY): Payer: Self-pay

## 2020-09-27 DIAGNOSIS — I10 Essential (primary) hypertension: Secondary | ICD-10-CM

## 2020-09-27 MED ORDER — AMLODIPINE BESYLATE 10 MG PO TABS
10.0000 mg | ORAL_TABLET | Freq: Every day | ORAL | 0 refills | Status: DC
  Filled 2020-09-27 – 2021-01-08 (×2): qty 30, 30d supply, fill #0

## 2020-09-27 MED FILL — Bictegravir-Emtricitabine-Tenofovir AF Tab 50-200-25 MG: ORAL | 30 days supply | Qty: 30 | Fill #0 | Status: AC

## 2020-09-27 NOTE — Telephone Encounter (Signed)
Requested medication (s) are due for refill today:yes  Requested medication (s) are on the active medication list: yes  Last refill: 09/07/2020  Future visit scheduled:no  Notes to clinic:  last appt canceled by provider    Requested Prescriptions  Pending Prescriptions Disp Refills   amLODipine (NORVASC) 10 MG tablet 90 tablet 0    Sig: TAKE 1 TABLET BY MOUTH ONCE A DAY. MUST HAVE OFFICE VISIT FOR REFILLS      Cardiovascular:  Calcium Channel Blockers Failed - 09/27/2020  9:14 AM      Failed - Last BP in normal range    BP Readings from Last 1 Encounters:  06/15/20 (!) 147/69          Passed - Valid encounter within last 6 months    Recent Outpatient Visits           4 months ago Essential hypertension   [place] Ladell Pier, MD   1 year ago Essential hypertension   [place] Ladell Pier, MD   1 year ago Essential hypertension   [place] Ladell Pier, MD   1 year ago Pap smear for cervical cancer screening   [place] Ladell Pier, MD   2 years ago Primary biliary cirrhosis [facility])   [facility] Ladell Pier, MD

## 2020-10-02 ENCOUNTER — Other Ambulatory Visit (HOSPITAL_COMMUNITY): Payer: Self-pay

## 2020-10-16 ENCOUNTER — Other Ambulatory Visit: Payer: Self-pay | Admitting: Internal Medicine

## 2020-10-16 DIAGNOSIS — I10 Essential (primary) hypertension: Secondary | ICD-10-CM

## 2020-10-25 ENCOUNTER — Other Ambulatory Visit (HOSPITAL_COMMUNITY): Payer: Self-pay

## 2020-10-25 MED FILL — Bictegravir-Emtricitabine-Tenofovir AF Tab 50-200-25 MG: ORAL | 30 days supply | Qty: 30 | Fill #1 | Status: AC

## 2020-10-26 ENCOUNTER — Other Ambulatory Visit: Payer: Self-pay | Admitting: Gastroenterology

## 2020-10-27 ENCOUNTER — Other Ambulatory Visit (HOSPITAL_COMMUNITY): Payer: Self-pay

## 2020-10-30 ENCOUNTER — Telehealth: Payer: Self-pay

## 2020-10-30 ENCOUNTER — Other Ambulatory Visit: Payer: Self-pay

## 2020-10-30 DIAGNOSIS — K921 Melena: Secondary | ICD-10-CM

## 2020-10-30 NOTE — Telephone Encounter (Signed)
Spoke with pt and she will come in for lab later this week. Order in epic.

## 2020-10-30 NOTE — Telephone Encounter (Signed)
Called [facility] and per office pt cancelled 3 appts and no showed the 4th one. Spoke with pt and let her know she really needed to see the surgeon to address her ulcers. Pt states she did not understand that and will call the office to see the surgeon. She is seeing some blood when she has a bowel movement and she is having abd pain.

## 2020-10-30 NOTE — Telephone Encounter (Signed)
-----   Message from Thornton Park, MD sent at 10/30/2020  9:44 AM EDT ----- Good morning. Would you please call this patient with an update in her symptoms? Did she ever see surgery as recommended given her persistent ulcers? If necessary, we could try to repeat an EGD at the hospital this week. But, ultimately, she really needs to see the surgeons.  Thanks.  KLB ----- Message ----- From: Carol Ada, MD Sent: 10/28/2020  11:44 AM EDT To: Thornton Park, MD  Joelene Millin,   Your patient called complaining about hematochezia and abdominal pain.  She is concerned about the persistent gastric ulcers.    Please give her a call when you get a chance.  Saralyn Pilar

## 2020-11-02 ENCOUNTER — Telehealth: Payer: Self-pay | Admitting: Gastroenterology

## 2020-11-02 ENCOUNTER — Other Ambulatory Visit: Payer: Self-pay | Admitting: Gastroenterology

## 2020-11-02 NOTE — Telephone Encounter (Signed)
Called patient back and she states she is having pain again from her stomach ulcer. States she did reschedule her appt. With [facility] and it is 12/09/20 or 12/10/20 (that she has it written down). Says she will come into our lab tomorrow for the CBC that Dr. Tarri Glenn had requested. I reminded her to not take any NSAIDs for the pain, only Tylenol. She agreed.

## 2020-11-02 NOTE — Telephone Encounter (Signed)
Inbound call from patient. States she believes she is having stomach colitis and would like a call back [phone]

## 2020-11-12 ENCOUNTER — Inpatient Hospital Stay (HOSPITAL_COMMUNITY)
Admission: EM | Admit: 2020-11-12 | Discharge: 2020-11-15 | DRG: 386 | Disposition: A | Discharge status: Home / Self Care | Payer: Medicare Other | Attending: Internal Medicine | Admitting: Internal Medicine

## 2020-11-12 ENCOUNTER — Encounter (HOSPITAL_COMMUNITY): Payer: Self-pay | Admitting: Emergency Medicine

## 2020-11-12 ENCOUNTER — Telehealth (HOSPITAL_COMMUNITY): Payer: Self-pay | Admitting: Physician Assistant

## 2020-11-12 ENCOUNTER — Emergency Department (HOSPITAL_COMMUNITY): Payer: Medicare Other

## 2020-11-12 ENCOUNTER — Other Ambulatory Visit: Payer: Self-pay

## 2020-11-12 DIAGNOSIS — Z20822 Contact with and (suspected) exposure to covid-19: Secondary | ICD-10-CM | POA: Diagnosis present

## 2020-11-12 DIAGNOSIS — K6389 Other specified diseases of intestine: Secondary | ICD-10-CM | POA: Diagnosis not present

## 2020-11-12 DIAGNOSIS — K7400 Hepatic fibrosis, unspecified: Secondary | ICD-10-CM | POA: Diagnosis present

## 2020-11-12 DIAGNOSIS — K754 Autoimmune hepatitis: Secondary | ICD-10-CM | POA: Diagnosis not present

## 2020-11-12 DIAGNOSIS — Z800 Family history of malignant neoplasm of digestive organs: Secondary | ICD-10-CM

## 2020-11-12 DIAGNOSIS — K219 Gastro-esophageal reflux disease without esophagitis: Secondary | ICD-10-CM | POA: Diagnosis present

## 2020-11-12 DIAGNOSIS — K51311 Ulcerative (chronic) rectosigmoiditis with rectal bleeding: Secondary | ICD-10-CM | POA: Diagnosis not present

## 2020-11-12 DIAGNOSIS — I10 Essential (primary) hypertension: Secondary | ICD-10-CM | POA: Diagnosis present

## 2020-11-12 DIAGNOSIS — K921 Melena: Secondary | ICD-10-CM

## 2020-11-12 DIAGNOSIS — R1111 Vomiting without nausea: Secondary | ICD-10-CM | POA: Diagnosis not present

## 2020-11-12 DIAGNOSIS — K743 Primary biliary cirrhosis: Secondary | ICD-10-CM | POA: Diagnosis present

## 2020-11-12 DIAGNOSIS — K279 Peptic ulcer, site unspecified, unspecified as acute or chronic, without hemorrhage or perforation: Secondary | ICD-10-CM | POA: Diagnosis present

## 2020-11-12 DIAGNOSIS — E785 Hyperlipidemia, unspecified: Secondary | ICD-10-CM | POA: Diagnosis present

## 2020-11-12 DIAGNOSIS — B20 Human immunodeficiency virus [HIV] disease: Secondary | ICD-10-CM | POA: Diagnosis not present

## 2020-11-12 DIAGNOSIS — R7989 Other specified abnormal findings of blood chemistry: Secondary | ICD-10-CM | POA: Diagnosis present

## 2020-11-12 DIAGNOSIS — I700 Atherosclerosis of aorta: Secondary | ICD-10-CM | POA: Diagnosis present

## 2020-11-12 DIAGNOSIS — Z87891 Personal history of nicotine dependence: Secondary | ICD-10-CM

## 2020-11-12 DIAGNOSIS — K257 Chronic gastric ulcer without hemorrhage or perforation: Secondary | ICD-10-CM | POA: Diagnosis not present

## 2020-11-12 DIAGNOSIS — N200 Calculus of kidney: Secondary | ICD-10-CM | POA: Diagnosis not present

## 2020-11-12 DIAGNOSIS — M351 Other overlap syndromes: Secondary | ICD-10-CM | POA: Diagnosis not present

## 2020-11-12 DIAGNOSIS — D62 Acute posthemorrhagic anemia: Secondary | ICD-10-CM | POA: Diagnosis present

## 2020-11-12 DIAGNOSIS — R1084 Generalized abdominal pain: Secondary | ICD-10-CM | POA: Diagnosis not present

## 2020-11-12 DIAGNOSIS — K529 Noninfective gastroenteritis and colitis, unspecified: Secondary | ICD-10-CM | POA: Diagnosis present

## 2020-11-12 DIAGNOSIS — D638 Anemia in other chronic diseases classified elsewhere: Secondary | ICD-10-CM | POA: Diagnosis present

## 2020-11-12 DIAGNOSIS — K5150 Left sided colitis without complications: Principal | ICD-10-CM | POA: Diagnosis present

## 2020-11-12 DIAGNOSIS — K51911 Ulcerative colitis, unspecified with rectal bleeding: Secondary | ICD-10-CM | POA: Diagnosis not present

## 2020-11-12 DIAGNOSIS — R58 Hemorrhage, not elsewhere classified: Secondary | ICD-10-CM | POA: Diagnosis not present

## 2020-11-12 DIAGNOSIS — R110 Nausea: Secondary | ICD-10-CM | POA: Diagnosis not present

## 2020-11-12 DIAGNOSIS — K449 Diaphragmatic hernia without obstruction or gangrene: Secondary | ICD-10-CM | POA: Diagnosis present

## 2020-11-12 DIAGNOSIS — K51211 Ulcerative (chronic) proctitis with rectal bleeding: Secondary | ICD-10-CM | POA: Diagnosis not present

## 2020-11-12 DIAGNOSIS — R197 Diarrhea, unspecified: Secondary | ICD-10-CM | POA: Diagnosis not present

## 2020-11-12 DIAGNOSIS — Z79899 Other long term (current) drug therapy: Secondary | ICD-10-CM | POA: Diagnosis not present

## 2020-11-12 DIAGNOSIS — K31A29 Gastric intestinal metaplasia with dysplasia, unspecified: Secondary | ICD-10-CM | POA: Diagnosis not present

## 2020-11-12 LAB — COMPREHENSIVE METABOLIC PANEL
ALT: 22 U/L (ref 0–44)
AST: 30 U/L (ref 15–41)
Albumin: 3.4 g/dL — ABNORMAL LOW (ref 3.5–5.0)
Alkaline Phosphatase: 212 U/L — ABNORMAL HIGH (ref 38–126)
Anion gap: 6 (ref 5–15)
BUN: 10 mg/dL (ref 8–23)
CO2: 26 mmol/L (ref 22–32)
Calcium: 9.6 mg/dL (ref 8.9–10.3)
Chloride: 106 mmol/L (ref 98–111)
Creatinine, Ser: 0.87 mg/dL (ref 0.44–1.00)
GFR, Estimated: >60 mL/min (ref >60)
Glucose, Bld: 121 mg/dL — ABNORMAL HIGH (ref 70–99)
Potassium: 3.2 mmol/L — ABNORMAL LOW (ref 3.5–5.1)
Sodium: 138 mmol/L (ref 135–145)
Total Bilirubin: 0.8 mg/dL (ref 0.3–1.2)
Total Protein: 7.9 g/dL (ref 6.5–8.1)

## 2020-11-12 LAB — CBC WITH DIFFERENTIAL/PLATELET
Abs Immature Granulocytes: 0.01 10*3/uL (ref 0.00–0.07)
Basophils Absolute: 0 10*3/uL (ref 0.0–0.1)
Basophils Relative: 0 %
Eosinophils Absolute: 0 10*3/uL (ref 0.0–0.5)
Eosinophils Relative: 1 %
HCT: 34 % — ABNORMAL LOW (ref 36.0–46.0)
Hemoglobin: 11.9 g/dL — ABNORMAL LOW (ref 12.0–15.0)
Immature Granulocytes: 0 %
Lymphocytes Relative: 57 %
Lymphs Abs: 3.4 10*3/uL (ref 0.7–4.0)
MCH: 30.9 pg (ref 26.0–34.0)
MCHC: 35 g/dL (ref 30.0–36.0)
MCV: 88.3 fL (ref 80.0–100.0)
Monocytes Absolute: 0.6 10*3/uL (ref 0.1–1.0)
Monocytes Relative: 11 %
Neutro Abs: 1.9 10*3/uL (ref 1.7–7.7)
Neutrophils Relative %: 31 %
Platelets: 341 10*3/uL (ref 150–400)
RBC: 3.85 MIL/uL — ABNORMAL LOW (ref 3.87–5.11)
RDW: 14.1 % (ref 11.5–15.5)
WBC: 6 10*3/uL (ref 4.0–10.5)
nRBC: 0 % (ref 0.0–0.2)

## 2020-11-12 LAB — LIPASE, BLOOD: Lipase: 47 U/L (ref 11–51)

## 2020-11-12 MED ORDER — SODIUM CHLORIDE 0.9 % IV BOLUS
1000.0000 mL | Freq: Once | INTRAVENOUS | Status: AC
  Administered 2020-11-13: 1000 mL via INTRAVENOUS

## 2020-11-12 MED ORDER — METHYLPREDNISOLONE SODIUM SUCC 40 MG IJ SOLR
20.0000 mg | Freq: Once | INTRAMUSCULAR | Status: AC
  Administered 2020-11-13: 20 mg via INTRAVENOUS
  Filled 2020-11-12: qty 1

## 2020-11-12 MED ORDER — FENTANYL CITRATE (PF) 100 MCG/2ML IJ SOLN
50.0000 ug | Freq: Once | INTRAMUSCULAR | Status: AC
Start: 2020-11-12 — End: 2020-11-13
  Administered 2020-11-13: 50 ug via INTRAVENOUS
  Filled 2020-11-12: qty 2

## 2020-11-12 MED ORDER — ONDANSETRON HCL 4 MG/2ML IJ SOLN
4.0000 mg | Freq: Once | INTRAMUSCULAR | Status: AC
  Administered 2020-11-13: 4 mg via INTRAVENOUS
  Filled 2020-11-12: qty 2

## 2020-11-12 NOTE — Telephone Encounter (Signed)
Pt passing [facility] for ~ 2 weeks.   Has upcoming appt w general surgeon set within the next week re non-healing gastric ulcer.  Also has cirrhosis.   Bleeding more pronounced early this AM.  Feels weak  Advised pt to come to [place].  Dr Loletha Carrow aware.    Azucena Freed PA-c

## 2020-11-12 NOTE — ED Provider Notes (Signed)
[facility] EMERGENCY DEPARTMENT Provider Note   CSN: [id number] Arrival date & time: 11/12/20  1656     History No chief complaint on file.   [name] [name] is a 60 y.o. female.  60 year old female with a history of ulcerative colitis, esophageal reflux, HIV (CD4 count 528), hypertension, hyperlipidemia, chronic gastric ulcer, substance abuse presents to the emergency department for complaints of abdominal pain x2 weeks.  Pain is cramping, sharp and waxing and waning in severity.  It is worse prior to having a bowel movement and relieves slightly after defecation.  She reports that her stool has been consistent with diarrhea mixed with bright red blood.  She has noted hematochezia with every bowel movement since her pain began.  Also reports difficulty keeping down food or fluids due to sporadic vomiting.  Her emesis has been occasionally streaked with bright red blood, but she denies any coffee-ground emesis or persistent hematemesis.  She has been taking Tylenol for pain without relief.  Does report fever of 101F yesterday.  Has been out of her budesonide for the past 3 weeks.  Is followed by Dr. Tarri Glenn of GI.  Not on chronic anticoagulation.  The history is provided by the patient. No language interpreter was used.       Past Medical History:  Diagnosis Date  . Cavities 02/02/2015  . Chronic gastric ulcer   . GERD (gastroesophageal reflux disease) Dx 2014  . GI bleed   . Grieving 05/04/2020  . HIV (human immunodeficiency virus infection) ([place]) Dx 2015  . Hyperlipidemia   . Hypertension Dx 2014  . Primary biliary cirrhosis ([place]) 07/03/2018  . Substance abuse ([place])    Quit 2008  . Ulcerative colitis [facility])     Patient Active Problem List   Diagnosis Date Noted  . Ulcerative colitis with rectal bleeding ([place]) 11/13/2020  . Aortic atherosclerosis ([place]) 11/13/2020  . Depression 05/04/2020  . Grieving 05/04/2020  . Chronic gastric ulcer with hemorrhage    . Peptic ulcer disease 06/28/2019  . History of colposcopy with cervical biopsy 06/28/2019  . Primary biliary cirrhosis ([place]) 07/03/2018  . LGSIL Pap smear of vagina 06/13/2017  . Immunization due 04/07/2017  . Calf cramp 12/17/2016  . HIV disease ([place]) 02/02/2015  . Current smoker 10/27/2014  . Hyperlipidemia 08/24/2014  . Vitamin D deficiency 08/24/2014  . S/P hysterectomy 08/23/2014  . Underweight 08/23/2014  . Ulcerative colitis ([place]) 05/27/2014  . HTN (hypertension) 05/27/2014  . Anemia of chronic disease 05/27/2014  . Insomnia 05/27/2014  . Dental caries 05/10/2014  . Cavitary lesion of lung 04/25/2014    Past Surgical History:  Procedure Laterality Date  . ABDOMINAL HYSTERECTOMY  2001    done in [facility], patient unsure of indication  . BIOPSY  04/27/2020   Procedure: BIOPSY;  Surgeon: Thornton Park, MD;  Location: [facility];  Service: Gastroenterology;;  . BIOPSY  06/15/2020   Procedure: BIOPSY;  Surgeon: Milus Banister, MD;  Location: [facility];  Service: Gastroenterology;;  . COLONOSCOPY    . ESOPHAGOGASTRODUODENOSCOPY    . ESOPHAGOGASTRODUODENOSCOPY (EGD) WITH PROPOFOL N/A 04/27/2020   Procedure: ESOPHAGOGASTRODUODENOSCOPY (EGD) WITH PROPOFOL;  Surgeon: Thornton Park, MD;  Location: [facility];  Service: Gastroenterology;  Laterality: N/A;  . ESOPHAGOGASTRODUODENOSCOPY (EGD) WITH PROPOFOL N/A 06/15/2020   Procedure: ESOPHAGOGASTRODUODENOSCOPY (EGD) WITH PROPOFOL;  Surgeon: Milus Banister, MD;  Location: [facility];  Service: Gastroenterology;  Laterality: N/A;  . TEE WITHOUT CARDIOVERSION N/A 05/02/2014   Procedure: TRANSESOPHAGEAL ECHOCARDIOGRAM (TEE);  Surgeon: Larey Dresser, MD;  Location: [facility];  Service: Cardiovascular;  Laterality: N/A;  . UPPER ESOPHAGEAL ENDOSCOPIC ULTRASOUND (EUS) N/A 06/15/2020   Procedure: UPPER ESOPHAGEAL ENDOSCOPIC ULTRASOUND (EUS);  Surgeon: Milus Banister, MD;  Location: [facility];   Service: Gastroenterology;  Laterality: N/A;  . VIDEO BRONCHOSCOPY Bilateral 04/27/2014   Procedure: VIDEO BRONCHOSCOPY WITH FLUORO;  Surgeon: Rigoberto Noel, MD;  Location: [facility];  Service: Cardiopulmonary;  Laterality: Bilateral;     OB History    Gravida  2   Para  1   Term  1   Preterm      AB  1   Living  1     SAB  1   IAB      Ectopic      Multiple      Live Births              Family History  Problem Relation Age of Onset  . Cancer Father        ?  Marland Kitchen Alcoholism Father   . Diabetes Mother   . Stroke Mother   . Pancreatitis Sister   . Diabetes Sister   . Colon cancer Paternal Uncle 37  . Prostate cancer Brother   . Colon cancer Brother   . Esophageal cancer Neg Hx   . Rectal cancer Neg Hx   . Stomach cancer Neg Hx     Social History   Tobacco Use  . Smoking status: Former Smoker    Packs/day: 0.20    Years: 35.00    Pack years: 7.00    Types: Cigarettes  . Smokeless tobacco: Never Used  Vaping Use  . Vaping Use: Never used  Substance Use Topics  . Alcohol use: No    Alcohol/week: 0.0 standard drinks  . Drug use: Yes    Types: Marijuana    Comment: previous cocaine use, marijuana on 12/29    Home Medications Prior to Admission medications   Medication Sig Start Date End Date Taking? Authorizing Provider  atorvastatin (LIPITOR) 10 MG tablet TAKE 1 TABLET BY MOUTH  DAILY Patient taking differently: Take 10 mg by mouth daily. 06/07/20  Yes Ladell Pier, MD  bictegravir-emtricitabine-tenofovir AF (BIKTARVY) 50-200-25 MG TABS tablet TAKE 1 TABLET BY MOUTH DAILY. 05/04/20 05/04/21 Yes Truman Hayward, MD  budesonide (ENTOCORT EC) 3 MG 24 hr capsule TAKE 3 CAPSULES BY MOUTH  DAILY Patient taking differently: Take 9 mg by mouth daily. 11/02/20  Yes Thornton Park, MD  escitalopram (LEXAPRO) 10 MG tablet TAKE 1/2 TABLET BY MOUTH DAILY FOR 30 DAYS THEN INCREASE TO 1 TABLET DAILY AS DIRECTED Patient taking differently: Take  10 mg by mouth daily. 05/04/20 05/04/21 Yes Truman Hayward, MD  famotidine (PEPCID) 20 MG tablet TAKE 1 TABLET BY MOUTH  TWICE DAILY Patient taking differently: Take 20 mg by mouth 2 (two) times daily. 05/18/20  Yes Thornton Park, MD  FEROSUL 325 (65 Fe) MG tablet TAKE 1 TABLET BY MOUTH DAILY WITH BREAKFAST. Patient taking differently: Take 325 mg by mouth daily with breakfast. 05/28/17  Yes Ladell Pier, MD  hyoscyamine (LEVSIN, ANASPAZ) 0.125 MG tablet Take 1 tablet (0.125 mg total) by mouth every 6 (six) hours as needed. Patient taking differently: Take 0.125 mg by mouth daily. 06/26/17  Yes Ladell Pier, MD  lisinopril (ZESTRIL) 5 MG tablet TAKE 1 TABLET BY MOUTH  DAILY Patient taking differently: Take 5 mg by mouth daily. 10/16/20  Yes  Ladell Pier, MD  Multiple Vitamins-Minerals (MULTIVITAMIN WITH MINERALS) tablet Take 1 tablet by mouth daily.   Yes [provider]  pantoprazole (PROTONIX) 40 MG tablet TAKE 1 TABLET BY MOUTH  DAILY Patient taking differently: Take 40 mg by mouth daily. 05/18/20  Yes Thornton Park, MD  sulfaSALAzine (AZULFIDINE) 500 MG tablet TAKE 2 TABLETS BY MOUTH  TWICE DAILY Patient taking differently: Take 1,000 mg by mouth 2 (two) times daily. 06/01/20  Yes Thornton Park, MD  amLODipine (NORVASC) 10 MG tablet Take 1 tablet (10 mg total) by mouth daily. Must have office visit for refills 09/27/20 10/27/20  Ladell Pier, MD  ursodiol (ACTIGALL) 250 MG tablet TAKE 1 TABLET BY MOUTH  TWICE DAILY Patient taking differently: Take 250 mg by mouth 2 (two) times daily. 05/09/20   Thornton Park, MD    Allergies    Patient has no known allergies.  Review of Systems   Review of Systems  Ten systems reviewed and are negative for acute change, except as noted in the HPI.    Physical Exam Updated Vital Signs BP (!) 148/92   Pulse 70   Temp 98.9 F (37.2 C)   Resp 16   SpO2 100%   Physical Exam Vitals and nursing note  reviewed.  Constitutional:      General: She is not in acute distress.    Appearance: She is well-developed. She is not diaphoretic.     Comments: Nontoxic appearing and in NAD  HENT:     Head: Normocephalic and atraumatic.  Eyes:     General: No scleral icterus.    Conjunctiva/sclera: Conjunctivae normal.  Cardiovascular:     Rate and Rhythm: Normal rate and regular rhythm.     Pulses: Normal pulses.  Pulmonary:     Effort: Pulmonary effort is normal. No respiratory distress.     Breath sounds: No stridor. No wheezing.     Comments: Respirations even and unlabored Abdominal:     General: There is distension.     Palpations: Abdomen is soft. There is no mass.     Tenderness: There is abdominal tenderness (generalized, worse in the suprapubic abdomen, LLQ). There is no guarding.  Musculoskeletal:        General: Normal range of motion.     Cervical back: Normal range of motion.  Skin:    General: Skin is warm and dry.     Coloration: Skin is not pale.     Findings: No erythema or rash.  Neurological:     Mental Status: She is alert and oriented to person, place, and time.     Coordination: Coordination normal.  Psychiatric:        Behavior: Behavior normal.     ED Results / Procedures / Treatments   Labs (all labs ordered are listed, but only abnormal results are displayed) Labs Reviewed  CBC WITH DIFFERENTIAL/PLATELET - Abnormal; Notable for the following components:      Result Value   RBC 3.85 (*)    Hemoglobin 11.9 (*)    HCT 34.0 (*)    All other components within normal limits  COMPREHENSIVE METABOLIC PANEL - Abnormal; Notable for the following components:   Potassium 3.2 (*)    Glucose, Bld 121 (*)    Albumin 3.4 (*)    Alkaline Phosphatase 212 (*)    All other components within normal limits  URINALYSIS, ROUTINE W REFLEX MICROSCOPIC - Abnormal; Notable for the following components:   APPearance HAZY (*)  Hgb urine dipstick MODERATE (*)    Leukocytes,Ua  LARGE (*)    WBC, UA >50 (*)    Bacteria, UA FEW (*)    Non Squamous Epithelial 0-5 (*)    All other components within normal limits  SEDIMENTATION RATE - Abnormal; Notable for the following components:   Sed Rate 65 (*)    All other components within normal limits  C-REACTIVE PROTEIN - Abnormal; Notable for the following components:   CRP 3.0 (*)    All other components within normal limits  CBC - Abnormal; Notable for the following components:   RBC 3.25 (*)    Hemoglobin 10.0 (*)    HCT 28.8 (*)    All other components within normal limits  COMPREHENSIVE METABOLIC PANEL - Abnormal; Notable for the following components:   Glucose, Bld 103 (*)    BUN 6 (*)    Calcium 8.5 (*)    Albumin 2.9 (*)    Alkaline Phosphatase 204 (*)    Anion gap 4 (*)    All other components within normal limits  SARS CORONAVIRUS 2 (TAT 6-24 HRS)  LIPASE, BLOOD  MAGNESIUM  PHOSPHORUS    EKG None  Radiology CT ABDOMEN PELVIS WO CONTRAST  Result Date: 11/12/2020 CLINICAL DATA:  Pain EXAM: CT ABDOMEN AND PELVIS WITHOUT CONTRAST TECHNIQUE: Multidetector CT imaging of the abdomen and pelvis was performed following the standard protocol without IV contrast. COMPARISON:  Nov 13, 2017. December 27, 2019 FINDINGS: Evaluation is limited secondary to lack of IV contrast. Lower chest: LEFT lower lobe bronchiectasis with adjacent airspace opacity, mildly increased since 2019. It spans approximately 9 x 12 mm, previously 8 x 10 mm (series 3, image 8). This is overall decreased since 2017. This likely reflects an area of recurring mucoid impaction. Hepatobiliary: Similar appearance of hypodense mass in the LEFT anterior liver and RIGHT posterior liver, previously characterized as benign hepatic cysts. Gallbladder is unremarkable. Pancreas: No peripancreatic fat stranding. Spleen: Unremarkable. Adrenals/Urinary Tract: Adrenal glands are unremarkable. No hydronephrosis. No obstructing nephrolithiasis. Punctate nonobstructive  LEFT-sided nephrolithiasis. Bladder is unremarkable. Stomach/Bowel: Small to moderate hiatal hernia. No evidence of bowel obstruction. Contiguous circumferential bowel wall thickening of the descending and rectosigmoid colon. Appendix normal. Vascular/Lymphatic: Severe atherosclerotic calcifications of the aorta. No suspicious lymph nodes. Reproductive: Status post hysterectomy. No adnexal masses. Other: Trace free fluid. Musculoskeletal: No acute or significant osseous findings. IMPRESSION: 1. There is circumferential bowel wall thickening of the descending and rectosigmoid colon. This likely reflects patient's reported history of ulcerative colitis but could reflect an additional nonspecific colitis. Recommend correlation with colonoscopy history. 2. Recurrent mucoid impaction of the LEFT lower lobe. Aortic Atherosclerosis (ICD10-I70.0). Electronically Signed   By: Valentino Saxon MD   On: 11/12/2020 18:36    Procedures Procedures   Medications Ordered in ED Medications  acetaminophen (TYLENOL) tablet 650 mg (has no administration in time range)    Or  acetaminophen (TYLENOL) suppository 650 mg (has no administration in time range)  ondansetron (ZOFRAN) tablet 4 mg (has no administration in time range)    Or  ondansetron (ZOFRAN) injection 4 mg (has no administration in time range)  0.9 % NaCl with KCl 20 mEq/ L  infusion ( Intravenous New Bag/Given 11/13/20 0502)  pantoprazole (PROTONIX) injection 40 mg (40 mg Intravenous Given 11/13/20 0456)  potassium chloride 10 mEq in 100 mL IVPB (10 mEq Intravenous New Bag/Given 11/13/20 0507)  hyoscyamine (LEVSIN SL) SL tablet 0.125 mg (has no administration in time range)  atorvastatin (  LIPITOR) tablet 10 mg (has no administration in time range)  multivitamin with minerals tablet 1 tablet (has no administration in time range)  escitalopram (LEXAPRO) tablet 10 mg (has no administration in time range)  bictegravir-emtricitabine-tenofovir AF (BIKTARVY)  50-200-25 MG per tablet 1 tablet (has no administration in time range)  amLODipine (NORVASC) tablet 10 mg (has no administration in time range)  lisinopril (ZESTRIL) tablet 5 mg (has no administration in time range)  methylPREDNISolone sodium succinate (SOLU-MEDROL) 40 mg/mL injection 20 mg (has no administration in time range)  sodium chloride 0.9 % bolus 1,000 mL (0 mLs Intravenous Stopped 11/13/20 0249)  methylPREDNISolone sodium succinate (SOLU-MEDROL) 40 mg/mL injection 20 mg (20 mg Intravenous Given 11/13/20 0025)  fentaNYL (SUBLIMAZE) injection 50 mcg (50 mcg Intravenous Given 11/13/20 0022)  ondansetron (ZOFRAN) injection 4 mg (4 mg Intravenous Given 11/13/20 0021)  fentaNYL (SUBLIMAZE) injection 50 mcg (50 mcg Intravenous Given 11/13/20 0251)    ED Course  I have reviewed the triage vital signs and the nursing notes.  Pertinent labs & imaging results that were available during my care of the patient were reviewed by me and considered in my medical decision making (see chart for details).  Clinical Course as of 11/13/20 4967  Nancy Fetter Nov 12, 2020  2241 Work-up initiated in triage.  She has no leukocytosis today and hemoglobin is stable.  Vital signs reassuring without fever, tachycardia, tachypnea.  No hypotension.  She does not meet criteria for SIRS/sepsis.  Her CT scan, though a noncontrasted study, is consistent with descending colonic wall thickening as well as thickening of the rectosigmoid colon.  Her findings are concerning for ulcerative colitis flare.  This may be related to her noncompliance with budesonide x3 weeks as she has been unable to refill this medication.  Will manage with IV fluids, IV steroids, fentanyl, Zofran and reassess. [RF]  Mon Nov 13, 2020  0133 Patient reports improvement in pain; declines additional pain medication at this time.   CRP elevated at 3 which may correlate with UC flare. Patient does not feel comfortable with outpatient management given how long pain  and hematochezia have been ongoing. Will consult for admission with plan for GI consult in AM. [KH]  0252 Case discussed with Dr. Olevia Bowens who will admit. Dr. Loletha Carrow, on call for [facility], sent secure chat message requesting inpatient consultation.  [KH]    Clinical Course User Index [KH] Antonietta Breach, PA-C   MDM Rules/Calculators/A&P                          60 year old female with history of chronic gastric ulcer as well as ulcerative colitis presents to the emergency department for 2 weeks of abdominal pain with hematochezia.  Not chronically anticoagulated.  She has been out of her budesonide x3 weeks.  Her evaluation today is concerning for acute UC flare, likely related to medication noncompliance.    She is afebrile with stable vital signs.  No leukocytosis.  Does have elevated inflammatory markers.  Treated in the emergency department with IV pain medication, IV fluids, Solu-Medrol.  Will admit to hospitalist service for continued management.  GI to consult.   Final Clinical Impression(s) / ED Diagnoses Final diagnoses:  Colitis, acute  Hematochezia    Rx / DC Orders ED Discharge Orders    None       Antonietta Breach, PA-C 11/13/20 0615    Fatima Blank, MD 11/13/20 [phone]

## 2020-11-12 NOTE — ED Triage Notes (Signed)
Pt to triage via [facility] from home.  Reports history of gastric ulcers.  C/o bright red blood in stool that has been in increasing x 2 weeks with abd pain.  15 lb weight loss in 2 weeks.  States she received blood and iron infusion on her last visit.

## 2020-11-12 NOTE — ED Provider Notes (Signed)
Emergency Medicine Provider Triage Evaluation Note  [name] [name] , a 60 y.o. female  was evaluated in triage.  Pt complains of bright red blood per rectum, painful bowel movements, abdominal pain which has been ongoing for the last 2 weeks, worsening over the last several days.  Patient reports 10 out of 10 pain.  Struve ulcerative colitis and PUD.  Followed by Dr. Tarri Glenn with GI.  Reports lots of bright red blood in her stool when she is making bowel movements.  Denies any nausea or vomiting.  No hemoptysis.  History of prior blood transfusion in the past.  Review of Systems  Positive: As above Negative: As above  Physical Exam  There were no vitals taken for this visit. Gen:   Awake, no distress   Resp:  Normal effort  MSK:   Moves extremities without difficulty  Other:  Mild lower abdominal tenderness to palpation, no peritoneal signs  Medical Decision Making  Medically screening exam initiated at 4:53 PM.  Appropriate orders placed.  Otilio Carpen Tolley was informed that the remainder of the evaluation will be completed by another provider, this initial triage assessment does not replace that evaluation, and the importance of remaining in the ED until their evaluation is complete.     Garald Balding, PA-C 11/12/20 1656    Hayden Rasmussen, MD 11/13/20 1009

## 2020-11-13 ENCOUNTER — Encounter (HOSPITAL_COMMUNITY): Payer: Self-pay | Admitting: Internal Medicine

## 2020-11-13 DIAGNOSIS — K31A29 Gastric intestinal metaplasia with dysplasia, unspecified: Secondary | ICD-10-CM | POA: Diagnosis not present

## 2020-11-13 DIAGNOSIS — K257 Chronic gastric ulcer without hemorrhage or perforation: Secondary | ICD-10-CM

## 2020-11-13 DIAGNOSIS — D638 Anemia in other chronic diseases classified elsewhere: Secondary | ICD-10-CM | POA: Diagnosis not present

## 2020-11-13 DIAGNOSIS — K7400 Hepatic fibrosis, unspecified: Secondary | ICD-10-CM | POA: Diagnosis present

## 2020-11-13 DIAGNOSIS — K743 Primary biliary cirrhosis: Secondary | ICD-10-CM | POA: Diagnosis present

## 2020-11-13 DIAGNOSIS — Z20822 Contact with and (suspected) exposure to covid-19: Secondary | ICD-10-CM | POA: Diagnosis present

## 2020-11-13 DIAGNOSIS — B20 Human immunodeficiency virus [HIV] disease: Secondary | ICD-10-CM | POA: Diagnosis present

## 2020-11-13 DIAGNOSIS — I700 Atherosclerosis of aorta: Secondary | ICD-10-CM | POA: Diagnosis not present

## 2020-11-13 DIAGNOSIS — D62 Acute posthemorrhagic anemia: Secondary | ICD-10-CM

## 2020-11-13 DIAGNOSIS — K754 Autoimmune hepatitis: Secondary | ICD-10-CM | POA: Diagnosis present

## 2020-11-13 DIAGNOSIS — Z79899 Other long term (current) drug therapy: Secondary | ICD-10-CM | POA: Diagnosis not present

## 2020-11-13 DIAGNOSIS — K219 Gastro-esophageal reflux disease without esophagitis: Secondary | ICD-10-CM | POA: Diagnosis present

## 2020-11-13 DIAGNOSIS — K51911 Ulcerative colitis, unspecified with rectal bleeding: Secondary | ICD-10-CM | POA: Diagnosis not present

## 2020-11-13 DIAGNOSIS — K5150 Left sided colitis without complications: Secondary | ICD-10-CM | POA: Diagnosis present

## 2020-11-13 DIAGNOSIS — E785 Hyperlipidemia, unspecified: Secondary | ICD-10-CM | POA: Diagnosis present

## 2020-11-13 DIAGNOSIS — Z800 Family history of malignant neoplasm of digestive organs: Secondary | ICD-10-CM | POA: Diagnosis not present

## 2020-11-13 DIAGNOSIS — I10 Essential (primary) hypertension: Secondary | ICD-10-CM | POA: Diagnosis present

## 2020-11-13 DIAGNOSIS — M351 Other overlap syndromes: Secondary | ICD-10-CM | POA: Diagnosis present

## 2020-11-13 DIAGNOSIS — Z87891 Personal history of nicotine dependence: Secondary | ICD-10-CM | POA: Diagnosis not present

## 2020-11-13 DIAGNOSIS — K279 Peptic ulcer, site unspecified, unspecified as acute or chronic, without hemorrhage or perforation: Secondary | ICD-10-CM | POA: Diagnosis not present

## 2020-11-13 DIAGNOSIS — K921 Melena: Secondary | ICD-10-CM | POA: Diagnosis not present

## 2020-11-13 DIAGNOSIS — K449 Diaphragmatic hernia without obstruction or gangrene: Secondary | ICD-10-CM | POA: Diagnosis present

## 2020-11-13 DIAGNOSIS — R7989 Other specified abnormal findings of blood chemistry: Secondary | ICD-10-CM | POA: Diagnosis present

## 2020-11-13 DIAGNOSIS — K529 Noninfective gastroenteritis and colitis, unspecified: Secondary | ICD-10-CM | POA: Diagnosis present

## 2020-11-13 DIAGNOSIS — K51311 Ulcerative (chronic) rectosigmoiditis with rectal bleeding: Secondary | ICD-10-CM

## 2020-11-13 DIAGNOSIS — K51211 Ulcerative (chronic) proctitis with rectal bleeding: Secondary | ICD-10-CM | POA: Diagnosis not present

## 2020-11-13 LAB — COMPREHENSIVE METABOLIC PANEL
ALT: 21 U/L (ref 0–44)
AST: 33 U/L (ref 15–41)
Albumin: 2.9 g/dL — ABNORMAL LOW (ref 3.5–5.0)
Alkaline Phosphatase: 204 U/L — ABNORMAL HIGH (ref 38–126)
Anion gap: 4 — ABNORMAL LOW (ref 5–15)
BUN: 6 mg/dL — ABNORMAL LOW (ref 8–23)
CO2: 23 mmol/L (ref 22–32)
Calcium: 8.5 mg/dL — ABNORMAL LOW (ref 8.9–10.3)
Chloride: 111 mmol/L (ref 98–111)
Creatinine, Ser: 0.7 mg/dL (ref 0.44–1.00)
GFR, Estimated: >60 mL/min (ref >60)
Glucose, Bld: 103 mg/dL — ABNORMAL HIGH (ref 70–99)
Potassium: 3.7 mmol/L (ref 3.5–5.1)
Sodium: 138 mmol/L (ref 135–145)
Total Bilirubin: 0.6 mg/dL (ref 0.3–1.2)
Total Protein: 6.9 g/dL (ref 6.5–8.1)

## 2020-11-13 LAB — URINALYSIS, ROUTINE W REFLEX MICROSCOPIC
Bilirubin Urine: NEGATIVE
Glucose, UA: NEGATIVE mg/dL
Ketones, ur: NEGATIVE mg/dL
Nitrite: NEGATIVE
Protein, ur: NEGATIVE mg/dL
Specific Gravity, Urine: 1.01 (ref 1.005–1.030)
WBC, UA: >50 WBC/hpf — ABNORMAL HIGH (ref 0–5)
pH: 6 (ref 5.0–8.0)

## 2020-11-13 LAB — CBC
HCT: 28.8 % — ABNORMAL LOW (ref 36.0–46.0)
Hemoglobin: 10 g/dL — ABNORMAL LOW (ref 12.0–15.0)
MCH: 30.8 pg (ref 26.0–34.0)
MCHC: 34.7 g/dL (ref 30.0–36.0)
MCV: 88.6 fL (ref 80.0–100.0)
Platelets: 290 10*3/uL (ref 150–400)
RBC: 3.25 MIL/uL — ABNORMAL LOW (ref 3.87–5.11)
RDW: 14.2 % (ref 11.5–15.5)
WBC: 5.1 10*3/uL (ref 4.0–10.5)
nRBC: 0 % (ref 0.0–0.2)

## 2020-11-13 LAB — HEMOGLOBIN AND HEMATOCRIT, BLOOD
HCT: 34.6 % — ABNORMAL LOW (ref 36.0–46.0)
Hemoglobin: 11.9 g/dL — ABNORMAL LOW (ref 12.0–15.0)

## 2020-11-13 LAB — SARS CORONAVIRUS 2 (TAT 6-24 HRS): SARS Coronavirus 2: NEGATIVE

## 2020-11-13 LAB — SEDIMENTATION RATE: Sed Rate: 65 mm/hr — ABNORMAL HIGH (ref 0–22)

## 2020-11-13 LAB — C-REACTIVE PROTEIN: CRP: 3 mg/dL — ABNORMAL HIGH (ref <1.0)

## 2020-11-13 LAB — MAGNESIUM: Magnesium: 1.7 mg/dL (ref 1.7–2.4)

## 2020-11-13 LAB — PHOSPHORUS: Phosphorus: 3.3 mg/dL (ref 2.5–4.6)

## 2020-11-13 MED ORDER — FENTANYL CITRATE (PF) 100 MCG/2ML IJ SOLN
50.0000 ug | Freq: Once | INTRAMUSCULAR | Status: AC
Start: 2020-11-13 — End: 2020-11-13
  Administered 2020-11-13: 50 ug via INTRAVENOUS
  Filled 2020-11-13: qty 2

## 2020-11-13 MED ORDER — HYDROMORPHONE HCL 1 MG/ML IJ SOLN: 0.7500 mg | INTRAMUSCULAR | Status: DC | PRN

## 2020-11-13 MED ORDER — POTASSIUM CHLORIDE IN NACL 20-0.9 MEQ/L-% IV SOLN
INTRAVENOUS | Status: DC
  Filled 2020-11-13 (×6): qty 1000

## 2020-11-13 MED ORDER — ONDANSETRON HCL 4 MG PO TABS
4.0000 mg | ORAL_TABLET | Freq: Four times a day (QID) | ORAL | Status: DC | PRN
Start: 2020-11-13 — End: 2020-11-15

## 2020-11-13 MED ORDER — HYOSCYAMINE SULFATE 0.125 MG SL SUBL
0.1250 mg | SUBLINGUAL_TABLET | Freq: Four times a day (QID) | SUBLINGUAL | Status: DC | PRN
  Filled 2020-11-13: qty 1

## 2020-11-13 MED ORDER — ESCITALOPRAM OXALATE 10 MG PO TABS
10.0000 mg | ORAL_TABLET | Freq: Every day | ORAL | Status: DC
  Administered 2020-11-13 – 2020-11-15 (×3): 10 mg via ORAL
  Filled 2020-11-13 (×4): qty 1

## 2020-11-13 MED ORDER — METHYLPREDNISOLONE SODIUM SUCC 40 MG IJ SOLR: 40.0000 mg | Freq: Two times a day (BID) | INTRAMUSCULAR | Status: DC

## 2020-11-13 MED ORDER — HYDROCODONE-ACETAMINOPHEN 5-325 MG PO TABS
1.0000 | ORAL_TABLET | Freq: Four times a day (QID) | ORAL | Status: DC | PRN
Start: 2020-11-13 — End: 2020-11-15
  Administered 2020-11-13 – 2020-11-15 (×5): 1 via ORAL
  Filled 2020-11-13 (×5): qty 1

## 2020-11-13 MED ORDER — LISINOPRIL 5 MG PO TABS
5.0000 mg | ORAL_TABLET | Freq: Every day | ORAL | Status: DC
  Administered 2020-11-13 – 2020-11-15 (×3): 5 mg via ORAL
  Filled 2020-11-13 (×4): qty 1

## 2020-11-13 MED ORDER — PANTOPRAZOLE SODIUM 40 MG IV SOLR
40.0000 mg | Freq: Two times a day (BID) | INTRAVENOUS | Status: DC
Start: 2020-11-13 — End: 2020-11-15
  Administered 2020-11-13 – 2020-11-15 (×6): 40 mg via INTRAVENOUS
  Filled 2020-11-13 (×6): qty 40

## 2020-11-13 MED ORDER — ADULT MULTIVITAMIN W/MINERALS CH
1.0000 | ORAL_TABLET | Freq: Every day | ORAL | Status: DC
  Administered 2020-11-13 – 2020-11-15 (×3): 1 via ORAL
  Filled 2020-11-13 (×4): qty 1

## 2020-11-13 MED ORDER — ACETAMINOPHEN 325 MG PO TABS
650.0000 mg | ORAL_TABLET | Freq: Four times a day (QID) | ORAL | Status: DC | PRN
  Administered 2020-11-13: 650 mg via ORAL
  Filled 2020-11-13: qty 2

## 2020-11-13 MED ORDER — MORPHINE SULFATE (PF) 2 MG/ML IV SOLN: 2.0000 mg | INTRAVENOUS | Status: DC | PRN

## 2020-11-13 MED ORDER — BICTEGRAVIR-EMTRICITAB-TENOFOV 50-200-25 MG PO TABS
1.0000 | ORAL_TABLET | Freq: Every day | ORAL | Status: DC
  Administered 2020-11-13 – 2020-11-15 (×3): 1 via ORAL
  Filled 2020-11-13 (×4): qty 1

## 2020-11-13 MED ORDER — ONDANSETRON HCL 4 MG/2ML IJ SOLN
4.0000 mg | Freq: Four times a day (QID) | INTRAMUSCULAR | Status: DC | PRN
  Administered 2020-11-13: 4 mg via INTRAVENOUS
  Filled 2020-11-13: qty 2

## 2020-11-13 MED ORDER — POTASSIUM CHLORIDE 10 MEQ/100ML IV SOLN
10.0000 meq | INTRAVENOUS | Status: AC
  Administered 2020-11-13 (×2): 10 meq via INTRAVENOUS
  Filled 2020-11-13 (×2): qty 100

## 2020-11-13 MED ORDER — METHYLPREDNISOLONE SODIUM SUCC 40 MG IJ SOLR
20.0000 mg | Freq: Three times a day (TID) | INTRAMUSCULAR | Status: DC
  Administered 2020-11-13 – 2020-11-14 (×4): 20 mg via INTRAVENOUS
  Filled 2020-11-13 (×4): qty 1

## 2020-11-13 MED ORDER — AMLODIPINE BESYLATE 10 MG PO TABS
10.0000 mg | ORAL_TABLET | Freq: Every day | ORAL | Status: DC
  Administered 2020-11-13 – 2020-11-15 (×3): 10 mg via ORAL
  Filled 2020-11-13 (×4): qty 1

## 2020-11-13 MED ORDER — ATORVASTATIN CALCIUM 10 MG PO TABS
10.0000 mg | ORAL_TABLET | Freq: Every day | ORAL | Status: DC
  Administered 2020-11-13 – 2020-11-15 (×3): 10 mg via ORAL
  Filled 2020-11-13 (×4): qty 1

## 2020-11-13 MED ORDER — ACETAMINOPHEN 650 MG RE SUPP: 650.0000 mg | Freq: Four times a day (QID) | RECTAL | Status: DC | PRN

## 2020-11-13 NOTE — ED Notes (Signed)
Hospitalist at bedside 

## 2020-11-13 NOTE — H&P (Signed)
History and Physical    [name] [name] KVQ:[id number] DOB: 1961-02-07 DOA: 11/12/2020  PCP: [name] Pier, MD   Patient coming from: Home.  I have personally briefly reviewed patient's old medical records in [place]  Chief Complaint: Abdominal pain, vomiting and diarrhea.  HPI: [name] [name] is a 60 y.o. female with medical history significant of cavities, chronic gastric ulcer, GERD, history of GI bleed, grieving (3 close relatives passed away in the last year), history of HIV on antiretrovirals, hyperlipidemia, hypertension, primary biliary cirrhosis, ulcerative colitis who is coming to the emergency department with complaints of 2-week history of progressively worse abdominal pain associated with loose stools with hematochezia then followed by emesis with bloody streaks content over the last 3 days.  Denies flank pain, dysuria, frequency or hematuria.  She denies fever, but has felt chills, fatigue and malaise.  No sore throat, rhinorrhea, wheezing or hemoptysis.  States she had an episode of burning chest pain over a week ago that subsided on its own.  Denies palpitations, dizziness, diaphoresis, PND, orthopnea or pitting edema of the lower extremities.  No polyuria, polydipsia, polyphagia or blurred vision.  ED Course: Initial vital signs were temperature 99.1 F, pulse 73, respirations 16, BP 141/83 mmHg O2 sat 100% on room air.  The patient received IV fluids, Solu-Medrol 20 mg IVPB, Zofran and fentanyl 50 mcg in the emergency department.  Labwork: Urinalysis showed large leukocyte esterase, WBC of more than 50 with a few bacteria and present WBC clumps.  ESR was 65 mm/h.  CBC showed a white count of 6.0 with 57% lymphocytes and 31% neutrophils.  Lipase was 47.  CRP 3.0 mg/dL.  CMP shows a potassium of 3.2 mmol/L, glucose of 121 mg/dL, albumin of 3.4 g/dL and alkaline phosphatase of 212 units/L.  The rest of the CMP results are within expected values.  Imaging: CT  abdomen/pelvis without contrast shows circumferential bowel wall thickening of the descending and rectosigmoid colon.  Recurrent mucoid impaction of the left lower lobe and aortic atherosclerosis.  Please see images and full regular report for further detail.  Review of Systems: As per HPI otherwise all other systems reviewed and are negative.  Past Medical History:  Diagnosis Date  . Cavities 02/02/2015  . Chronic gastric ulcer   . GERD (gastroesophageal reflux disease) Dx 2014  . GI bleed   . Grieving 05/04/2020  . HIV (human immunodeficiency virus infection) ([place]) Dx 2015  . Hyperlipidemia   . Hypertension Dx 2014  . Primary biliary cirrhosis ([place]) 07/03/2018  . Substance abuse ([place])    Quit 2008  . Ulcerative colitis [facility])    Past Surgical History:  Procedure Laterality Date  . ABDOMINAL HYSTERECTOMY  2001    done in [facility], patient unsure of indication  . BIOPSY  04/27/2020   Procedure: BIOPSY;  Surgeon: [name] Park, MD;  Location: [facility];  Service: Gastroenterology;;  . BIOPSY  06/15/2020   Procedure: BIOPSY;  Surgeon: [name] Banister, MD;  Location: [facility];  Service: Gastroenterology;;  . COLONOSCOPY    . ESOPHAGOGASTRODUODENOSCOPY    . ESOPHAGOGASTRODUODENOSCOPY (EGD) WITH PROPOFOL N/A 04/27/2020   Procedure: ESOPHAGOGASTRODUODENOSCOPY (EGD) WITH PROPOFOL;  Surgeon: [name] Park, MD;  Location: [facility];  Service: Gastroenterology;  Laterality: N/A;  . ESOPHAGOGASTRODUODENOSCOPY (EGD) WITH PROPOFOL N/A 06/15/2020   Procedure: ESOPHAGOGASTRODUODENOSCOPY (EGD) WITH PROPOFOL;  Surgeon: [name] Banister, MD;  Location: [facility];  Service: Gastroenterology;  Laterality: N/A;  . TEE WITHOUT CARDIOVERSION N/A 05/02/2014  Procedure: TRANSESOPHAGEAL ECHOCARDIOGRAM (TEE);  Surgeon: Larey Dresser, MD;  Location: [place];  Service: Cardiovascular;  Laterality: N/A;  . UPPER ESOPHAGEAL ENDOSCOPIC ULTRASOUND (EUS) N/A 06/15/2020    Procedure: UPPER ESOPHAGEAL ENDOSCOPIC ULTRASOUND (EUS);  Surgeon: [name] Banister, MD;  Location: [facility];  Service: Gastroenterology;  Laterality: N/A;  . VIDEO BRONCHOSCOPY Bilateral 04/27/2014   Procedure: VIDEO BRONCHOSCOPY WITH FLUORO;  Surgeon: [name] Noel, MD;  Location: [facility];  Service: Cardiopulmonary;  Laterality: Bilateral;   Social History  reports that she has quit smoking. Her smoking use included cigarettes. She has a 7.00 pack-year smoking history. She has never used smokeless tobacco. She reports current drug use. Drugs:  and Marijuana. She reports that she does not drink alcohol.  No Known Allergies  Family History  Problem Relation Age of Onset  . Cancer Father        ?  Marland Kitchen Alcoholism Father   . Diabetes Mother   . Stroke Mother   . Pancreatitis Sister   . Diabetes Sister   . Colon cancer Paternal Uncle 46  . Prostate cancer Brother   . Colon cancer Brother   . Esophageal cancer Neg Hx   . Rectal cancer Neg Hx   . Stomach cancer Neg Hx    Prior to Admission medications   Medication Sig Start Date End Date Taking? Authorizing Provider  amLODipine (NORVASC) 10 MG tablet Take 1 tablet (10 mg total) by mouth daily. Must have office visit for refills 09/27/20 10/27/20  [name] Pier, MD  atorvastatin (LIPITOR) 10 MG tablet TAKE 1 TABLET BY MOUTH  DAILY Patient taking differently: Take 10 mg by mouth daily. 06/07/20   [name] Pier, MD  bictegravir-emtricitabine-tenofovir AF (BIKTARVY) 50-200-25 MG TABS tablet TAKE 1 TABLET BY MOUTH DAILY. 05/04/20 05/04/21  [name] Hayward, MD  budesonide (ENTOCORT EC) 3 MG 24 hr capsule TAKE 3 CAPSULES BY MOUTH  DAILY Patient taking differently: Take 9 mg by mouth daily. 11/02/20   [name] Park, MD  escitalopram (LEXAPRO) 10 MG tablet TAKE 1/2 TABLET BY MOUTH DAILY FOR 30 DAYS THEN INCREASE TO 1 TABLET DAILY AS DIRECTED Patient taking differently: Take 10 mg by mouth daily. 05/04/20 05/04/21  [name] Hayward, MD  famotidine (PEPCID) 20 MG tablet TAKE 1 TABLET BY MOUTH  TWICE DAILY Patient taking differently: Take 20 mg by mouth 2 (two) times daily. 05/18/20   [name] Park, MD  FEROSUL 325 (65 Fe) MG tablet TAKE 1 TABLET BY MOUTH DAILY WITH BREAKFAST. Patient taking differently: Take 325 mg by mouth daily with breakfast. 05/28/17   [name] Pier, MD  hyoscyamine (LEVSIN, ANASPAZ) 0.125 MG tablet Take 1 tablet (0.125 mg total) by mouth every 6 (six) hours as needed. Patient taking differently: Take 0.125 mg by mouth daily. 06/26/17   [name] Pier, MD  lisinopril (ZESTRIL) 5 MG tablet TAKE 1 TABLET BY MOUTH  DAILY Patient taking differently: Take 5 mg by mouth daily. 10/16/20   [name] Pier, MD  Multiple Vitamins-Minerals (MULTIVITAMIN WITH MINERALS) tablet Take 1 tablet by mouth daily.    [provider]  pantoprazole (PROTONIX) 40 MG tablet TAKE 1 TABLET BY MOUTH  DAILY Patient taking differently: Take 40 mg by mouth daily. 05/18/20   [name] Park, MD  sulfaSALAzine (AZULFIDINE) 500 MG tablet TAKE 2 TABLETS BY MOUTH  TWICE DAILY Patient taking differently: Take 1,000 mg by mouth 2 (two) times daily. 06/01/20   [name] Park, MD  ursodiol (ACTIGALL)  250 MG tablet TAKE 1 TABLET BY MOUTH  TWICE DAILY Patient taking differently: Take 250 mg by mouth 2 (two) times daily. 05/09/20   [name] Park, MD   Physical Exam: Vitals:   11/12/20 2330 11/13/20 0000 11/13/20 0145 11/13/20 0215  BP: (!) 145/73 (!) 150/74 (!) 166/75 (!) 152/78  Pulse: 72 73 66 66  Resp: (!) 22 17 (!) 24 18  Temp:      TempSrc:      SpO2: 100% 100% 100% 99%   Constitutional: Looks chronically ill.  NAD, calm, comfortable Eyes: PERRL.  Sclerae, lids and conjunctivae mildly injected. ENMT: Mucous membranes are mildly dry. Posterior pharynx clear of any exudate or lesions. Neck: normal, supple, no masses, no thyromegaly Respiratory: clear to auscultation bilaterally, no wheezing,  no crackles. Normal respiratory effort. No accessory muscle use.  Cardiovascular: Regular rate and rhythm, no murmurs / rubs / gallops. No extremity edema. 2+ pedal pulses. No carotid bruits.  Abdomen: No distention.  Bowel sounds positive.  Soft, positive for gastric and LLQ tenderness, no peritoneal signs, no masses palpated. No hepatosplenomegaly. Bowel sounds positive.  Musculoskeletal: no clubbing / cyanosis.  Good ROM, no contractures. Normal muscle tone.  Skin: no acute rashes, lesions, ulcers on very limited dermatological examination. Neurologic: CN 2-12 grossly intact. Sensation intact, DTR normal. Strength 5/5 in all 4.  Psychiatric: Normal judgment and insight. Alert and oriented x 3. Normal mood.   Labs on Admission: I have personally reviewed following labs and imaging studies  CBC: Recent Labs  Lab 11/12/20 1752  WBC 6.0  NEUTROABS 1.9  HGB 11.9*  HCT 34.0*  MCV 88.3  PLT 283    Basic Metabolic Panel: Recent Labs  Lab 11/12/20 1752  NA 138  K 3.2*  CL 106  CO2 26  GLUCOSE 121*  BUN 10  CREATININE 0.87  CALCIUM 9.6    GFR: CrCl cannot be calculated (Unknown ideal weight.).  Liver Function Tests: Recent Labs  Lab 11/12/20 1752  AST 30  ALT 22  ALKPHOS 212*  BILITOT 0.8  PROT 7.9  ALBUMIN 3.4*   Radiological Exams on Admission: CT ABDOMEN PELVIS WO CONTRAST  Result Date: 11/12/2020 CLINICAL DATA:  Pain EXAM: CT ABDOMEN AND PELVIS WITHOUT CONTRAST TECHNIQUE: Multidetector CT imaging of the abdomen and pelvis was performed following the standard protocol without IV contrast. COMPARISON:  Nov 13, 2017. December 27, 2019 FINDINGS: Evaluation is limited secondary to lack of IV contrast. Lower chest: LEFT lower lobe bronchiectasis with adjacent airspace opacity, mildly increased since 2019. It spans approximately 9 x 12 mm, previously 8 x 10 mm (series 3, image 8). This is overall decreased since 2017. This likely reflects an area of recurring mucoid impaction.  Hepatobiliary: Similar appearance of hypodense mass in the LEFT anterior liver and RIGHT posterior liver, previously characterized as benign hepatic cysts. Gallbladder is unremarkable. Pancreas: No peripancreatic fat stranding. Spleen: Unremarkable. Adrenals/Urinary Tract: Adrenal glands are unremarkable. No hydronephrosis. No obstructing nephrolithiasis. Punctate nonobstructive LEFT-sided nephrolithiasis. Bladder is unremarkable. Stomach/Bowel: Small to moderate hiatal hernia. No evidence of bowel obstruction. Contiguous circumferential bowel wall thickening of the descending and rectosigmoid colon. Appendix normal. Vascular/Lymphatic: Severe atherosclerotic calcifications of the aorta. No suspicious lymph nodes. Reproductive: Status post hysterectomy. No adnexal masses. Other: Trace free fluid. Musculoskeletal: No acute or significant osseous findings. IMPRESSION: 1. There is circumferential bowel wall thickening of the descending and rectosigmoid colon. This likely reflects patient's reported history of ulcerative colitis but could reflect an additional nonspecific colitis. Recommend  correlation with colonoscopy history. 2. Recurrent mucoid impaction of the LEFT lower lobe. Aortic Atherosclerosis (ICD10-I70.0). Electronically Signed   By: Valentino Saxon MD   On: 11/12/2020 18:36    EKG: Independently reviewed.  Assessment/Plan Principal Problem:   Ulcerative colitis with rectal bleeding (HCC) Observation/telemetry. Keep NPO. Continue IV fluids. Continue Solu-Medrol 40 mg IVP every 12 hours. Avoid antimotility agents as possible. Pantoprazole 40 mg IVPB every 12 hours. Antiemetics as needed. Follow-up CBC and CMP. Consult gastroenterology. Holding sulfadiazine pending GI evaluation.  Active Problems:   HTN (hypertension) Continue amlodipine 10 mg p.o. daily. Continue lisinopril 5 mg p.o. daily. Monitor BP, heart rate, renal function electrolytes.    Anemia of chronic disease Monitor  hematocrit and hemoglobin. Transfuse as needed.    HIV disease ([place]) Continue antiretrovirals.    Peptic ulcer disease Continue PPI.    Aortic atherosclerosis (HCC)   Hyperlipidemia On atorvastatin.    DVT prophylaxis: SCDs. Code Status:   Full code. Family Communication: Disposition Plan:   Patient is from:  Home.  Anticipated DC to:  Home.  Anticipated DC date:  11/14/2020 or 11/15/2020.  Anticipated DC barriers: Clinical status and consultant sign a fair  Consults called:  Message left for [facility]. Admission status:  Observation/telemetry.   Severity of Illness: High severity due to presenting with a streak hematemesis and hematochezia in the setting of ulcerative colitis exacerbation.  The patient will need to remain in the hospital for further evaluation and treatment.  Reubin Milan MD [facility]  How to contact the [facility] Attending or Consulting provider [place] or covering provider during after hours [place], for this patient?   1. Check the care team in [facility] and look for a) attending/consulting [facility] provider listed and b) the [facility] team listed 2. Log into [email or web address] and use [place]'s universal password to access. If you do not have the password, please contact the hospital operator. 3. Locate the [facility] provider you are looking for under [facility] and page to a number that you can be directly reached. 4. If you still have difficulty reaching the provider, please page the [facility] (Director on Call) for the Hospitalists listed on amion for assistance.  11/13/2020, 3:10 AM   This document was prepared in Dragon voice recognition software and may contain some unintended transcription errors.

## 2020-11-13 NOTE — Progress Notes (Signed)
Patient's chart reviewed.  Appears to be admitted secondary to active colitis in the presence of ulcerative colitis.  She has had a documented non-healing gastric antral ulcer with concerns for metaplasia, but most recent EUS in December did not show a mass necessarily.  She has been referred to our office and has cancelled 3 appointments and no showed one.  She has another appointment set up for 6/22 with Dr. Zenia Resides.  She is on protonix as home, could add carafate if ok with GI.  It has been 6 months since last evaluation so state of ulcer is unclear.  Given, she is here for active colitis and started on steroids, we would not pursue any intervention for this chronic problem while here.  Discussed with primary service.  The patient can keep her outpatient appointment set up with Dr. Zenia Resides to address further plans of care, especially as she is not necessary a low risk surgical candidate given her cirrhosis and multiple other medical problems.  Henreitta Cea 11:22 AM 11/13/2020

## 2020-11-13 NOTE — Consult Note (Addendum)
[facility] Consult: 8:07 AM 11/13/2020  LOS: 0 days    Referring Provider: Dr Olevia Bowens  Primary Care Physician:  Ladell Pier, MD Primary Gastroenterologist:  Dr. Tarri Glenn.  Last office visit/encounter 04/2020.    Reason for Consultation:  Hematochezia, abdominal pain, blood-streaked emesis.Marland Kitchen     HPI: [name] [name] is a 60 y.o. female.  PMH HIV on Biktarvy.  Ulcerative colitis.  GERD.  Hiatal hernia.  Cyst and gastric ulcer despite maximum medical therapy.  12/2007 colonoscopy: Random biopsies with diffuse, active, chronic colitis, possible UC. 04/2014 colonoscopy.  Random biopsies with diffuse chronic active colitis, idiopathic IBD. 03/2019 colonoscopy with pancolonic pseudopolyps, no active colitis. CT in 10/2017 with enteritis, gastritis, hepatomegaly, liver lesions. 04/2018 ultrasound with solid mass in left liver, hyperechoic mass in right liver cyst in right liver. 02/2019 MRI, MRCP over recent years showing suspected cirrhosis without liver masses as seen on ultrasound. 12/2019 MRI with mild cirrhosis, no masses.  Small liver and kidney cysts. 03/2019, 05/2019, 09/2019 EGDs with persistent gastric antral ulcer.  No H. pylori.  Intestinal metaplasia in 10/05/2019. 04/2020 EGD: Focal high-grade dysplasia in persistent gastric ulcer 05/2020 EGD/EUS: Persistent clean-based 6 to 8 mm gastric ulcer, not clearly neoplastic.  Ultrasound with nonspecific thickening of mucosa not clearly a mass or tumor.  If this is neoplastic it is T1 or early T2.  Nearby gastric area not irregularly thickened, no impression of lienitis plastica.  No perigastric adenopathy.  Limited but normal views of the liver, pancreas, spleen, portal/splenic vessels.  Extensive biopsies With intestinal metaplasia and atypia in a background  of inflammation.  Findings indefinite for dysplasia but overall favored to be reactive. PBC with autoimmune hepatitis overlap, abnormal LFTs.    - Liver biopsy 04/20/14: chronic portal hepatitis with marked duct injury c/w PBC    - 2015: Stage 2-3 PBC of 4. Reviewed at [place] 03/01/2019: ANA 1:1280, IgG 1793, AMA 215.5, IgM 121    - Liver biopsy 03/15/2019 showed 2-3 fibrosis    - Abdominal imaging has suggested cirrhosis    - FIB-4 1.56 and APRI 0.450 do not support a diagnosis of cirrhosis    - Labs 11/02/19: IgM 1486, alk phos 202   On sulfasalazine, ursodiol, budesonide, Protonix bid, Pepcid  Bid, po iron.  Referred for surgery to address persistent ulcer.  Due to multiple deaths of close family members, she got depressed, anxious and way laid follow-up with medical providers but has been mostly compliant with meds.  She has upcoming gen surgery appt coming up in June.   Ran out of budesonide at the beginning of May.  2 weeks of hematochezia, lower abdominal pain.  Nausea, vomiting with some episodes of minor to moderate hematemesis.  Bleeding, pain, N/V accelerated over the last few days.  For almost a week is vomiting up her meds.  No fever no chills.  No trouble breathing.  Estimates 14 pounds weight loss in less than a month.  Using Tylenol Extra Strength for pain, no NSAIDs, no aspirin products.  Presented to ED. Normotensive to hypertensive, no tach twice daily, bradycardia.  Excellent room air sats.  No fever. Hgb 11.9 >> 10 overnight.  Was 12.4 in early 05/2020, 6 months ago.  MCV 88.  Platelets normal. No current PT/INR. No elevated BUN. T bili, transaminases normal.  Alk phos 204. CRP 3. CTAP w/o contrast: Circumferential thickening in the descending and rectosigmoid colon.  Recurrent mucoid impaction left lower lobe lung.  Family history gastric cancer in her father.  Lives with brother and daughter.  No alcohol.  No tobacco products.  Occasional marijuana  smoking.    Past Medical History:  Diagnosis Date  . Cavities 02/02/2015  . Chronic gastric ulcer   . GERD (gastroesophageal reflux disease) Dx 2014  . GI bleed   . Grieving 05/04/2020  . HIV (human immunodeficiency virus infection) ([place] ) Dx 2015  . Hyperlipidemia   . Hypertension Dx 2014  . Primary biliary cirrhosis ([place]) 07/03/2018  . Substance abuse ([place])    Quit 2008  . Ulcerative colitis [facility])     Past Surgical History:  Procedure Laterality Date  . ABDOMINAL HYSTERECTOMY  2001    done in [facility], patient unsure of indication  . BIOPSY  04/27/2020   Procedure: BIOPSY;  Surgeon: Thornton Park, MD;  Location: [facility];  Service: Gastroenterology;;  . BIOPSY  06/15/2020   Procedure: BIOPSY;  Surgeon: Milus Banister, MD;  Location: [facility];  Service: Gastroenterology;;  . COLONOSCOPY    . ESOPHAGOGASTRODUODENOSCOPY    . ESOPHAGOGASTRODUODENOSCOPY (EGD) WITH PROPOFOL N/A 04/27/2020   Procedure: ESOPHAGOGASTRODUODENOSCOPY (EGD) WITH PROPOFOL;  Surgeon: Thornton Park, MD;  Location: [facility];  Service: Gastroenterology;  Laterality: N/A;  . ESOPHAGOGASTRODUODENOSCOPY (EGD) WITH PROPOFOL N/A 06/15/2020   Procedure: ESOPHAGOGASTRODUODENOSCOPY (EGD) WITH PROPOFOL;  Surgeon: Milus Banister, MD;  Location: [facility];  Service: Gastroenterology;  Laterality: N/A;  . TEE WITHOUT CARDIOVERSION N/A 05/02/2014   Procedure: TRANSESOPHAGEAL ECHOCARDIOGRAM (TEE);  Surgeon: Larey Dresser, MD;  Location: [facility];  Service: Cardiovascular;  Laterality: N/A;  . UPPER ESOPHAGEAL ENDOSCOPIC ULTRASOUND (EUS) N/A 06/15/2020   Procedure: UPPER ESOPHAGEAL ENDOSCOPIC ULTRASOUND (EUS);  Surgeon: Milus Banister, MD;  Location: [facility];  Service: Gastroenterology;  Laterality: N/A;  . VIDEO BRONCHOSCOPY Bilateral 04/27/2014   Procedure: VIDEO BRONCHOSCOPY WITH FLUORO;  Surgeon: Rigoberto Noel, MD;  Location: [facility];  Service: Cardiopulmonary;   Laterality: Bilateral;    Prior to Admission medications   Medication Sig Start Date End Date Taking? Authorizing Provider  amLODipine (NORVASC) 10 MG tablet Take 1 tablet (10 mg total) by mouth daily. Must have office visit for refills 09/27/20 10/27/20 Yes Ladell Pier, MD  atorvastatin (LIPITOR) 10 MG tablet TAKE 1 TABLET BY MOUTH  DAILY Patient taking differently: Take 10 mg by mouth daily. 06/07/20  Yes Ladell Pier, MD  bictegravir-emtricitabine-tenofovir AF (BIKTARVY) 50-200-25 MG TABS tablet TAKE 1 TABLET BY MOUTH DAILY. 05/04/20 05/04/21 Yes Truman Hayward, MD  budesonide (ENTOCORT EC) 3 MG 24 hr capsule TAKE 3 CAPSULES BY MOUTH  DAILY Patient taking differently: Take 9 mg by mouth daily. 11/02/20  Yes Thornton Park, MD  escitalopram (LEXAPRO) 10 MG tablet TAKE 1/2 TABLET BY MOUTH DAILY FOR 30 DAYS THEN INCREASE TO 1 TABLET DAILY AS DIRECTED Patient taking differently: Take 10 mg by mouth daily. 05/04/20 05/04/21 Yes Truman Hayward, MD  famotidine (PEPCID) 20 MG tablet TAKE 1 TABLET BY MOUTH  TWICE DAILY Patient taking differently: Take 20 mg  by mouth 2 (two) times daily. 05/18/20  Yes Thornton Park, MD  FEROSUL 325 (65 Fe) MG tablet TAKE 1 TABLET BY MOUTH DAILY WITH BREAKFAST. Patient taking differently: Take 325 mg by mouth daily with breakfast. 05/28/17  Yes Ladell Pier, MD  hyoscyamine (LEVSIN, ANASPAZ) 0.125 MG tablet Take 1 tablet (0.125 mg total) by mouth every 6 (six) hours as needed. Patient taking differently: Take 0.125 mg by mouth every 4 (four) hours as needed for bladder spasms or cramping. 06/26/17  Yes Ladell Pier, MD  lisinopril (ZESTRIL) 5 MG tablet TAKE 1 TABLET BY MOUTH  DAILY Patient taking differently: Take 5 mg by mouth daily. 10/16/20  Yes Ladell Pier, MD  Multiple Vitamins-Minerals (MULTIVITAMIN WITH MINERALS) tablet Take 1 tablet by mouth daily.   Yes [provider]  pantoprazole (PROTONIX) 40 MG tablet  TAKE 1 TABLET BY MOUTH  DAILY Patient taking differently: Take 40 mg by mouth daily. 05/18/20  Yes Thornton Park, MD  sulfaSALAzine (AZULFIDINE) 500 MG tablet TAKE 2 TABLETS BY MOUTH  TWICE DAILY Patient taking differently: Take 1,000 mg by mouth 2 (two) times daily. 06/01/20  Yes Thornton Park, MD  ursodiol (ACTIGALL) 250 MG tablet TAKE 1 TABLET BY MOUTH  TWICE DAILY Patient taking differently: Take 250 mg by mouth 2 (two) times daily. 05/09/20  Yes Thornton Park, MD    Scheduled Meds: . amLODipine  10 mg Oral Daily  . atorvastatin  10 mg Oral Daily  . bictegravir-emtricitabine-tenofovir AF  1 tablet Oral Daily  . escitalopram  10 mg Oral Daily  . lisinopril  5 mg Oral Daily  . methylPREDNISolone (SOLU-MEDROL) injection  20 mg Intravenous Q8H  . multivitamin with minerals  1 tablet Oral Daily  . pantoprazole (PROTONIX) IV  40 mg Intravenous Q12H   Infusions: . 0.9 % NaCl with KCl 20 mEq / L 100 mL/hr at 11/13/20 0502   PRN Meds: acetaminophen **OR** acetaminophen, hyoscyamine, ondansetron **OR** ondansetron (ZOFRAN) IV   Allergies as of 11/12/2020  . (No Known Allergies)    Family History  Problem Relation Age of Onset  . Cancer Father        ?  Marland Kitchen Alcoholism Father   . Diabetes Mother   . Stroke Mother   . Pancreatitis Sister   . Diabetes Sister   . Colon cancer Paternal Uncle 72  . Prostate cancer Brother   . Colon cancer Brother   . Esophageal cancer Neg Hx   . Rectal cancer Neg Hx   . Stomach cancer Neg Hx     Social History   Socioeconomic History  . Marital status: Single    Spouse name: Not on file  . Number of children: Not on file  . Years of education: Not on file  . Highest education level: Not on file  Occupational History  . Not on file  Tobacco Use  . Smoking status: Former Smoker    Packs/day: 0.20    Years: 35.00    Pack years: 7.00    Types: Cigarettes  . Smokeless tobacco: Never Used  Vaping Use  . Vaping Use: Never used   Substance and Sexual Activity  . Alcohol use: No    Alcohol/week: 0.0 standard drinks  . Drug use: Yes    Types: Marijuana    Comment: previous cocaine use, marijuana on 12/29  . Sexual activity: Not Currently    Birth control/protection: Other-see comments, Surgical    Comment: offered condoms; 03/2019  Other Topics Concern  .  Not on file  Social History Narrative  . Not on file   Social Determinants of Health   Financial Resource Strain: Not on file  Food Insecurity: Not on file  Transportation Needs: Not on file  Physical Activity: Not on file  Stress: Not on file  Social Connections: Not on file  Intimate Partner Violence: Not on file    REVIEW OF SYSTEMS: Constitutional: Some dizziness, not profound.  Fatigue and weakness. ENT: Poor dentition.  She is aware that she needs extensive dental intervention, probably complete extraction of remaining teeth but she has put it off due to other social and medical issues.  No nose bleeds Pulm: No shortness of breath or cough. CV:  No palpitations, no LE edema.  No angina. GU:  No hematuria, no frequency.  No dysuria GI: See HPI. Heme: Other than bleeding described in HPI, i.e. hematemesis and hematochezia, no other unusual or excessive bleeding/bruising Transfusions: PRBCs in 2015. Neuro:  No headaches, no peripheral tingling or numbness Derm:  No itching, no rash or sores.  Endocrine:  No sweats or chills.  No polyuria or dysuria Immunization: Completed Pfizer vaccines x2, did not inquire about boosters.  Up-to-date on multiple vaccines. Travel:  None beyond local counties in last few months.    PHYSICAL EXAM: Vital signs in last 24 hours: Vitals:   11/13/20 0315 11/13/20 0618  BP: (!) 148/92 (!) 152/83  Pulse: 70 70  Resp: 16 19  Temp:    SpO2: 100% 99%   Wt Readings from Last 3 Encounters:  06/15/20 56.2 kg  05/19/20 56.7 kg  05/16/20 56.9 kg    General: Cachectic, comfortable, alert.  Because she is so thin  she looks chronically ill. Head: No facial asymmetry or swelling.  No signs of head trauma. Eyes: No conjunctival pallor or scleral icterus Ears: No hearing deficit Nose: No congestion or discharge Mouth: Mucosa is pink, moist, clear.  Tongue is midline.  Foul odor emanating from mouth, several missing teeth, lots of dental caries and decay. Neck: No JVD, no masses, no thyromegaly Lungs: No labored breathing or cough.  Lungs clear bilaterally. Heart: RRR.  No MRG.  S1, S2 present Abdomen: Soft.  Diffusely tender, more pronounced in the lower quadrants bilaterally.  No guarding or rebound.  No distention.  Bowel sounds active..   Rectal: DRE with no masses.  Scant blood on exam finger. Musc/Skeltl: No joint redness, swelling or gross deformity.  Overall thin limbs. Extremities: No CCE. Neurologic: Alert.  Oriented x3.  Appropriate.  Provides good history.  Moves all 4 limbs without weakness or tremor. Skin: No rash, no sores, no telangiectasia. Nodes: No cervical adenopathy Psych: Cooperative, calm, pleasant.  Intake/Output from previous day: 05/29 0701 - 05/30 0700 In: 1100 [IV Piggyback:1100] Out: -  Intake/Output this shift: No intake/output data recorded.  LAB RESULTS: Recent Labs    11/12/20 1752 11/13/20 0500  WBC 6.0 5.1  HGB 11.9* 10.0*  HCT 34.0* 28.8*  PLT 341 290   BMET Lab Results  Component Value Date   NA 138 11/13/2020   NA 138 11/12/2020   NA 141 05/25/2020   K 3.7 11/13/2020   K 3.2 (L) 11/12/2020   K 4.3 05/25/2020   CL 111 11/13/2020   CL 106 11/12/2020   CL 104 05/25/2020   CO2 23 11/13/2020   CO2 26 11/12/2020   CO2 31 05/25/2020   GLUCOSE 103 (H) 11/13/2020   GLUCOSE 121 (H) 11/12/2020   GLUCOSE 147 (H)  05/25/2020   BUN 6 (L) 11/13/2020   BUN 10 11/12/2020   BUN 12 05/25/2020   CREATININE 0.70 11/13/2020   CREATININE 0.87 11/12/2020   CREATININE 0.85 05/25/2020   CALCIUM 8.5 (L) 11/13/2020   CALCIUM 9.6 11/12/2020   CALCIUM 9.4  05/25/2020   LFT Recent Labs    11/12/20 1752 11/13/20 0500  PROT 7.9 6.9  ALBUMIN 3.4* 2.9*  AST 30 33  ALT 22 21  ALKPHOS 212* 204*  BILITOT 0.8 0.6   PT/INR Lab Results  Component Value Date   INR 1.1 (H) 05/25/2020   INR 0.9 03/15/2019   INR 1.0 03/01/2019   Hepatitis Panel No results for input(s): HEPBSAG, HCVAB, HEPAIGM, HEPBIGM in the last 72 hours. C-Diff No components found for: CDIFF Lipase     Component Value Date/Time   LIPASE 47 11/12/2020 1752    Drugs of Abuse  No results found for: LABOPIA, COCAINSCRNUR, LABBENZ, AMPHETMU, THCU, LABBARB   RADIOLOGY STUDIES: CT ABDOMEN PELVIS WO CONTRAST  Result Date: 11/12/2020 CLINICAL DATA:  Pain EXAM: CT ABDOMEN AND PELVIS WITHOUT CONTRAST TECHNIQUE: Multidetector CT imaging of the abdomen and pelvis was performed following the standard protocol without IV contrast. COMPARISON:  Nov 13, 2017. December 27, 2019 FINDINGS: Evaluation is limited secondary to lack of IV contrast. Lower chest: LEFT lower lobe bronchiectasis with adjacent airspace opacity, mildly increased since 2019. It spans approximately 9 x 12 mm, previously 8 x 10 mm (series 3, image 8). This is overall decreased since 2017. This likely reflects an area of recurring mucoid impaction. Hepatobiliary: Similar appearance of hypodense mass in the LEFT anterior liver and RIGHT posterior liver, previously characterized as benign hepatic cysts. Gallbladder is unremarkable. Pancreas: No peripancreatic fat stranding. Spleen: Unremarkable. Adrenals/Urinary Tract: Adrenal glands are unremarkable. No hydronephrosis. No obstructing nephrolithiasis. Punctate nonobstructive LEFT-sided nephrolithiasis. Bladder is unremarkable. Stomach/Bowel: Small to moderate hiatal hernia. No evidence of bowel obstruction. Contiguous circumferential bowel wall thickening of the descending and rectosigmoid colon. Appendix normal. Vascular/Lymphatic: Severe atherosclerotic calcifications of the aorta.  No suspicious lymph nodes. Reproductive: Status post hysterectomy. No adnexal masses. Other: Trace free fluid. Musculoskeletal: No acute or significant osseous findings. IMPRESSION: 1. There is circumferential bowel wall thickening of the descending and rectosigmoid colon. This likely reflects patient's reported history of ulcerative colitis but could reflect an additional nonspecific colitis. Recommend correlation with colonoscopy history. 2. Recurrent mucoid impaction of the LEFT lower lobe. Aortic Atherosclerosis (ICD10-I70.0). Electronically Signed   By: Valentino Saxon MD   On: 11/12/2020 18:36     IMPRESSION:   *   Colitis, active.  Hx UC.  At most recent colonoscopy biopsies showed inactive colitis.  2 weeks of bloody stool, abdominal pain and CT confirmation of left sided colitis.  Acute symptoms began 7 to 10 days after running out of budesonide.  *    nonhealing gastric ulcer.  Patient has not yet seen surgery despite referral. Recent hematemesis w streaks of blood in emesis.  Compliant with max PPI and famotidine at home.    *     PBC, autoimmune hepatitis overlap, liver fibrosis and possible cirrhosis.  *     HIV, at last viral RNA studies in 04/2020 this was well controlled.  *    Manele anemia.  Drop of nearly 1 g over less than 24 hours, suspect some of this is from IV fluids/delusional effect as well as the GI bleeding.    PLAN:     *    Attending  physician has initiated Solu-Medrol 20 mg every 8 hours, Protonix 40 mg every 12 hours.  *   Restart Budesonide 9 mg now?   *   Allow clears as tolerated  *   ? EGD?     *   Get inpt surgical referral, rather than waiting for pndg outpt appt set for June   Sarah Gribbin  11/13/2020, 8:07 AM Phone [phone]  I have reviewed the entire case in detail with the above APP and discussed the plan in detail.  Therefore, I agree with the diagnoses recorded above. In addition,  I have personally interviewed and examined the  patient and have personally reviewed any abdominal/pelvic CT scan images.  My additional thoughts are as follows:  Left-sided ulcerative colitis with flare, manifested as abdominal pain, hematochezia worsening over the last few weeks.  She has been out without her budesonide several weeks, which she blames on her pharmacy and the fact that she moved.  No recent antibiotic use that we know of, C. difficile stool checked.  She looks well, has similar histrionic diffuse tenderness to very light palpation of the abdominal wall, but has a nonsurgical abdomen.  Anemic but not in need of transfusion.  She also has a nonhealing gastric ulcer with dysplasia and has not followed up with his surgeons as outlined in their brief note from earlier today.  She says she had multiple deaths in the family was unable to make those visits, but intends to make the upcoming visit.  She is currently on IV Solu-Medrol, and clinically stable says she feels better than when she came in. GI consultation will see her tomorrow, anticipate likelihood that this patient might then be ready to change to prednisone and perhaps be discharged home soon for outpatient follow-up with Dr. Tarri Glenn soon. I will have our primary consult team communicate with Dr. Tarri Glenn to see if she would prefer an inpatient upper endoscopy to get done since it is been about 6 months since last endoscopic assessment and the patient will be seeing the surgeon soon.  Lastly, to clarify because of what I saw on the surgeon's note, while this patient does have primary biliary cholangitis/AIH overlap syndrome, previous testing and Dr. Jacqualyn Posey documentation indicates this patient does not have cirrhosis.  Nevertheless, I agree that it would not be appropriate for her to have inpatient gastric surgery in the setting of a acute colitis flare.  [facility] L Dan[facility] III [phone]

## 2020-11-13 NOTE — Progress Notes (Addendum)
  PROGRESS NOTE    [name] [name]  MDY:[id number] DOB: [birth year] DOA: 11/12/2020  PCP: Ladell Pier, MD    LOS - 0    Patient admitted earlier this AM with flare of ulcerative colitis with bloody stools.   Interval subjective: Pt feeling little better.  Still some abdominal pain at times.  Had another red bloody BM earlier this AM.  N/V is better, may want to try eating later.  No fever or chills or other acute complaints.   Exam: awake, alert, NAD Heart RRR, Lungs CTAB Abdomen tender on palpation without guarding or rebound tenderness Moves all extremities, normal tone, no edema Skin dry, intact Neuro grossly non-focal with normal speech   Principal Problem:   Ulcerative colitis with rectal bleeding (HCC) Active Problems:   HTN (hypertension)   Anemia of chronic disease   Hyperlipidemia   HIV disease (HCC)   Peptic ulcer disease   Aortic atherosclerosis ([place])    I have reviewed the full H&P by Dr. Olevia Bowens in detail, and I agree with the assessment and plan as outlined therein.  In addition:  Clinically, her bleeding seems related to UC flare, unlikely upper GI bleed with known ulcer.    --Discussed with general surgery, recommend keeping outpatient appointment with them for ulcer --GI recs noted, appreciated --Advance diet as tolerated, NPO after midnight until GI sees tomorrow.   --Repeat Hbg level this afternoon    No Charge    Ezekiel Slocumb, DO [facility]   If 7PM-7AM, please contact night-coverage [email or web address] 11/13/2020, 7:05 AM

## 2020-11-13 NOTE — ED Notes (Signed)
Pt's daughter asked that patient give her a call once she wakes up in the morning.

## 2020-11-14 DIAGNOSIS — D638 Anemia in other chronic diseases classified elsewhere: Secondary | ICD-10-CM

## 2020-11-14 DIAGNOSIS — I700 Atherosclerosis of aorta: Secondary | ICD-10-CM

## 2020-11-14 DIAGNOSIS — K51211 Ulcerative (chronic) proctitis with rectal bleeding: Secondary | ICD-10-CM

## 2020-11-14 DIAGNOSIS — K279 Peptic ulcer, site unspecified, unspecified as acute or chronic, without hemorrhage or perforation: Secondary | ICD-10-CM | POA: Diagnosis not present

## 2020-11-14 DIAGNOSIS — B20 Human immunodeficiency virus [HIV] disease: Secondary | ICD-10-CM

## 2020-11-14 LAB — BASIC METABOLIC PANEL
Anion gap: 5 (ref 5–15)
BUN: 9 mg/dL (ref 8–23)
CO2: 22 mmol/L (ref 22–32)
Calcium: 8.3 mg/dL — ABNORMAL LOW (ref 8.9–10.3)
Chloride: 107 mmol/L (ref 98–111)
Creatinine, Ser: 0.78 mg/dL (ref 0.44–1.00)
GFR, Estimated: >60 mL/min (ref >60)
Glucose, Bld: 150 mg/dL — ABNORMAL HIGH (ref 70–99)
Potassium: 4.6 mmol/L (ref 3.5–5.1)
Sodium: 134 mmol/L — ABNORMAL LOW (ref 135–145)

## 2020-11-14 LAB — CBC
HCT: 30.9 % — ABNORMAL LOW (ref 36.0–46.0)
Hemoglobin: 10.6 g/dL — ABNORMAL LOW (ref 12.0–15.0)
MCH: 30.2 pg (ref 26.0–34.0)
MCHC: 34.3 g/dL (ref 30.0–36.0)
MCV: 88 fL (ref 80.0–100.0)
Platelets: 306 10*3/uL (ref 150–400)
RBC: 3.51 MIL/uL — ABNORMAL LOW (ref 3.87–5.11)
RDW: 14 % (ref 11.5–15.5)
WBC: 5.3 10*3/uL (ref 4.0–10.5)
nRBC: 0 % (ref 0.0–0.2)

## 2020-11-14 MED ORDER — PREDNISONE 20 MG PO TABS
40.0000 mg | ORAL_TABLET | Freq: Every day | ORAL | Status: DC
  Administered 2020-11-14 – 2020-11-15 (×2): 40 mg via ORAL
  Filled 2020-11-14 (×2): qty 2

## 2020-11-14 NOTE — Progress Notes (Addendum)
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    # Left sided UC, admitted with flare symptoms manifested as abdomina pain and hematochezia. CT scan shows circumferential bowel wall thickening of the descending and rectosigmoid colon. She has been off budesonide for several weeks ( she moved, had pharmacy issues, etc)  --Lower abdominal cramping and bleeding improving but still haivng ~ 10 loose BMs a day. Tolerating solid diet.  --Need to rule out C-diff.  --Currently on Solumedrol 20 mg TID. Will change to Prednisone 40 mg daily. --Maybe home tomorrow if C-diff negative and she is improving.    # [place] anemia. Hgb stable at 10.6.   # Non-healing antral ulcer with dysplasia. Focal HGD on biopsies in November 2021. EUS December 2021 showed persistent antral ulcer.  Biopsies c/w ulcer with intestinal metaplasia and focal atypia in background of inflammation not indefinite for HGD. She has not followed up with Surgery ( multiple family deaths) but does have appt in June.  --? need inpatient EGD since it has been 6 months since last one and she will be seeing Surgery soon.    # PBC / AIH overlap syndrome without cirrhosis.     SUBJECTIVE:   Not having as much bleeding. Still having several loose BMs a day. Pre-defecatory cramping improving.    OBJECTIVE:    Scheduled inpatient medications:  . amLODipine  10 mg Oral Daily  . atorvastatin  10 mg Oral Daily  . bictegravir-emtricitabine-tenofovir AF  1 tablet Oral Daily  . escitalopram  10 mg Oral Daily  . lisinopril  5 mg Oral Daily  . methylPREDNISolone (SOLU-MEDROL) injection  20 mg Intravenous Q8H  . multivitamin with minerals  1 tablet Oral Daily  . pantoprazole (PROTONIX) IV  40 mg Intravenous Q12H   Continuous inpatient infusions:  . 0.9 % NaCl with KCl 20 mEq / L 100 mL/hr  at 11/14/20 0512   PRN inpatient medications: acetaminophen **OR** acetaminophen, HYDROcodone-acetaminophen, hyoscyamine, morphine injection, ondansetron **OR** ondansetron (ZOFRAN) IV  Vital signs in last 24 hours: Temp:  [98.9 F (37.2 C)-99 F (37.2 C)] 99 F (37.2 C) (05/31 0346) Pulse Rate:  [64-71] 64 (05/31 1010) Resp:  [15-17] 17 (05/31 1010) BP: (145-162)/(83-89) 162/85 (05/31 1010) SpO2:  [99 %-100 %] 100 % (05/31 1010) Last BM Date: 11/14/20  Intake/Output Summary (Last 24 hours) at 11/14/2020 1222 Last data filed at 11/14/2020 6962 Gross per 24 hour  Intake 2406.68 ml  Output --  Net 2406.68 ml     Physical Exam:  . General: Alert thin female in NAD . Heart:  Regular rate and rhythm. No lower extremity edema . Pulmonary: Normal respiratory effort . Abdomen: Soft, nondistended, mild diffuse tenderness to mild palpation. Normal bowel sounds.  . Neurologic: Alert and oriented . Psych: Pleasant. Cooperative.   There were no vitals filed for this visit.  Intake/Output from previous day: 05/30 0701 - 05/31 0700 In: 3316.7 [P.O.:840; I.V.:2376.7; IV Piggyback:100] Out: -  Intake/Output this shift: No intake/output data recorded.    Lab Results: Recent Labs    11/12/20 1752 11/13/20 0500 11/13/20 1619 11/14/20 0104  WBC 6.0 5.1  --  5.3  HGB 11.9* 10.0* 11.9* 10.6*  HCT 34.0* 28.8* 34.6* 30.9*  PLT 341 290  --  306   BMET  Recent Labs    11/12/20 1752 11/13/20 0500 11/14/20 0104  NA 138 138 134*  K 3.2* 3.7 4.6  CL 106 111 107  CO2 26 23 22   GLUCOSE 121* 103* 150*  BUN 10 6* 9  CREATININE 0.87 0.70 0.78  CALCIUM 9.6 8.5* 8.3*   LFT Recent Labs    11/13/20 0500  PROT 6.9  ALBUMIN 2.9*  AST 33  ALT 21  ALKPHOS 204*  BILITOT 0.6   PT/INR No results for input(s): LABPROT, INR in the last 72 hours. Hepatitis Panel No results for input(s): HEPBSAG, HCVAB, HEPAIGM, HEPBIGM in the last 72 hours.  CT ABDOMEN PELVIS WO CONTRAST  Result  Date: 11/12/2020 CLINICAL DATA:  Pain EXAM: CT ABDOMEN AND PELVIS WITHOUT CONTRAST TECHNIQUE: Multidetector CT imaging of the abdomen and pelvis was performed following the standard protocol without IV contrast. COMPARISON:  Nov 13, 2017. December 27, 2019 FINDINGS: Evaluation is limited secondary to lack of IV contrast. Lower chest: LEFT lower lobe bronchiectasis with adjacent airspace opacity, mildly increased since 2019. It spans approximately 9 x 12 mm, previously 8 x 10 mm (series 3, image 8). This is overall decreased since 2017. This likely reflects an area of recurring mucoid impaction. Hepatobiliary: Similar appearance of hypodense mass in the LEFT anterior liver and RIGHT posterior liver, previously characterized as benign hepatic cysts. Gallbladder is unremarkable. Pancreas: No peripancreatic fat stranding. Spleen: Unremarkable. Adrenals/Urinary Tract: Adrenal glands are unremarkable. No hydronephrosis. No obstructing nephrolithiasis. Punctate nonobstructive LEFT-sided nephrolithiasis. Bladder is unremarkable. Stomach/Bowel: Small to moderate hiatal hernia. No evidence of bowel obstruction. Contiguous circumferential bowel wall thickening of the descending and rectosigmoid colon. Appendix normal. Vascular/Lymphatic: Severe atherosclerotic calcifications of the aorta. No suspicious lymph nodes. Reproductive: Status post hysterectomy. No adnexal masses. Other: Trace free fluid. Musculoskeletal: No acute or significant osseous findings. IMPRESSION: 1. There is circumferential bowel wall thickening of the descending and rectosigmoid colon. This likely reflects patient's reported history of ulcerative colitis but could reflect an additional nonspecific colitis. Recommend correlation with colonoscopy history. 2. Recurrent mucoid impaction of the LEFT lower lobe. Aortic Atherosclerosis (ICD10-I70.0). Electronically Signed   By: Valentino Saxon MD   On: 11/12/2020 18:36        Principal Problem:    Ulcerative colitis with rectal bleeding ([place]) Active Problems:   HTN (hypertension)   Anemia of chronic disease   Hyperlipidemia   HIV disease ([place])   Peptic ulcer disease   Aortic atherosclerosis ([place])   Colitis     LOS: 1 day   Tye Savoy ,NP 11/14/2020, 12:22 PM   GI ATTENDING  Interval history data reviewed.  Initial hospital course reviewed with Dr. Loletha Carrow.  Agree with interval progress note as outlined above.  Patient with ulcerative colitis flare.  Improving on steroids.  C. difficile testing pending.  History of nonhealing gastric ulcer with dysplasia as noted above.  I agree that she should be able to be discharged home tomorrow on steroids with GI follow-up.  No plans for repeat EGD at this point, if surgery is inevitable.  She has been told the importance of keeping that appointment.  Docia Chuck. Geri Seminole., M.D. [facility] Division of Gastroenterology

## 2020-11-14 NOTE — Progress Notes (Signed)
PROGRESS NOTE    [name] [name]  HYW:[id number] DOB: March 29, 1961 DOA: 11/12/2020 PCP: Ladell Pier, MD  Outpatient Specialists:   Brief Narrative:  As per H&P done on admission: "[name] [name] is a 60 y.o. female with medical history significant of cavities, chronic gastric ulcer, GERD, history of GI bleed, grieving (3 close relatives passed away in the last year), history of HIV on antiretrovirals, hyperlipidemia, hypertension, primary biliary cirrhosis, ulcerative colitis who is coming to the emergency department with complaints of 2-week history of progressively worse abdominal pain associated with loose stools with hematochezia then followed by emesis with bloody streaks content over the last 3 days.  Denies flank pain, dysuria, frequency or hematuria.  She denies fever, but has felt chills, fatigue and malaise.  No sore throat, rhinorrhea, wheezing or hemoptysis.  States she had an episode of burning chest pain over a week ago that subsided on its own.  Denies palpitations, dizziness, diaphoresis, PND, orthopnea or pitting edema of the lower extremities.  No polyuria, polydipsia, polyphagia or blurred vision.  ED Course: Initial vital signs were temperature 99.1 F, pulse 73, respirations 16, BP 141/83 mmHg O2 sat 100% on room air.  The patient received IV fluids, Solu-Medrol 20 mg IVPB, Zofran and fentanyl 50 mcg in the emergency department.  Labwork: Urinalysis showed large leukocyte esterase, WBC of more than 50 with a few bacteria and present WBC clumps.  ESR was 65 mm/h.  CBC showed a white count of 6.0 with 57% lymphocytes and 31% neutrophils.  Lipase was 47.  CRP 3.0 mg/dL.  CMP shows a potassium of 3.2 mmol/L, glucose of 121 mg/dL, albumin of 3.4 g/dL and alkaline phosphatase of 212 units/L.  The rest of the CMP results are within expected values.  Imaging: CT abdomen/pelvis without contrast shows circumferential bowel wall thickening of the descending and rectosigmoid  colon.  Recurrent mucoid impaction of the left lower lobe and aortic atherosclerosis.  Please see images and full regular report for further detail".  11/14/2020: Patient seen.  GI input is appreciated.  Patient tells me that the abdominal pain has improved.  Patient continues to have bloody stool, said to be soft, about 10 times daily.  Assessment & Plan:   Principal Problem:   Ulcerative colitis with rectal bleeding (H[facility]) Active Problems:   HTN (hypertension)   Anemia of chronic disease   Hyperlipidemia   HIV disease ([place])   Peptic ulcer disease   Aortic atherosclerosis ([place])   Colitis  Ulcerative colitis with rectal bleeding ([place]) -GI team is directing care. -IV Solu-Medrol has been changed to oral prednisone 40 Mg p.o. once daily. -Abdominal pain has improved. -Loose/diarrheal stools persist.   -GI team is checking stool for C. difficile as well.  HTN (hypertension) Continue amlodipine 10 mg p.o. daily. Continue lisinopril 5 mg p.o. daily. Monitor BP, heart rate, renal function electrolytes.  Anemia of chronic disease Monitor hematocrit and hemoglobin. Transfuse as needed.  HIV disease ([place]) Continue antiretrovirals.  Peptic ulcer disease Continue PPI. Patient will need to follow with the surgical team as patient will may need repeat EGD. History of metaplasia.    Aortic atherosclerosis (HCC) Hyperlipidemia On atorvastatin.    DVT prophylaxis: SCD. Code Status: Full code Family Communication:  Disposition Plan: Home eventually   Consultants:   GI.  Surgery  Procedures:   None  Antimicrobials:   None   Subjective: Abdominal pain is improving. Loose/diarrheal stools persist. No fever or chills.  Objective: Vitals:   11/13/20  1956 11/14/20 0346 11/14/20 1010 11/14/20 1358  BP: (!) 145/83 (!) 147/89 (!) 162/85 136/82  Pulse: 71 64 64 69  Resp: 15 17 17 16   Temp: 98.9 F (37.2 C) 99 F (37.2 C)  98.3 F (36.8 C)  TempSrc: Oral Oral   Oral  SpO2: 99% 100% 100% 99%    Intake/Output Summary (Last 24 hours) at 11/14/2020 2021 Last data filed at 11/14/2020 1741 Gross per 24 hour  Intake 3106.25 ml  Output --  Net 3106.25 ml   There were no vitals filed for this visit.  Examination:  General exam: Appears calm and comfortable.  Patient is cachectic. Respiratory system: Clear to auscultation.  Cardiovascular system: S1 & S2  Gastrointestinal system: Abdomen is nondistended, soft and nontender. No organomegaly or masses felt. Normal bowel sounds heard. Central nervous system: Alert and oriented.  Patient moves all extremities.   Extremities: No leg edema.  Data Reviewed: I have personally reviewed following labs and imaging studies  CBC: Recent Labs  Lab 11/12/20 1752 11/13/20 0500 11/13/20 1619 11/14/20 0104  WBC 6.0 5.1  --  5.3  NEUTROABS 1.9  --   --   --   HGB 11.9* 10.0* 11.9* 10.6*  HCT 34.0* 28.8* 34.6* 30.9*  MCV 88.3 88.6  --  88.0  PLT 341 290  --  213   Basic Metabolic Panel: Recent Labs  Lab 11/12/20 1752 11/13/20 0500 11/14/20 0104  NA 138 138 134*  K 3.2* 3.7 4.6  CL 106 111 107  CO2 26 23 22   GLUCOSE 121* 103* 150*  BUN 10 6* 9  CREATININE 0.87 0.70 0.78  CALCIUM 9.6 8.5* 8.3*  MG  --  1.7  --   PHOS  --  3.3  --    GFR: CrCl cannot be calculated (Unknown ideal weight.). Liver Function Tests: Recent Labs  Lab 11/12/20 1752 11/13/20 0500  AST 30 33  ALT 22 21  ALKPHOS 212* 204*  BILITOT 0.8 0.6  PROT 7.9 6.9  ALBUMIN 3.4* 2.9*   Recent Labs  Lab 11/12/20 1752  LIPASE 47   No results for input(s): AMMONIA in the last 168 hours. Coagulation Profile: No results for input(s): INR, PROTIME in the last 168 hours. Cardiac Enzymes: No results for input(s): CKTOTAL, CKMB, CKMBINDEX, TROPONINI in the last 168 hours. BNP (last 3 results) No results for input(s): PROBNP in the last 8760 hours. HbA1C: No results for input(s): HGBA1C in the last 72 hours. CBG: No results  for input(s): GLUCAP in the last 168 hours. Lipid Profile: No results for input(s): CHOL, HDL, LDLCALC, TRIG, CHOLHDL, LDLDIRECT in the last 72 hours. Thyroid Function Tests: No results for input(s): TSH, T4TOTAL, FREET4, T3FREE, THYROIDAB in the last 72 hours. Anemia Panel: No results for input(s): VITAMINB12, FOLATE, FERRITIN, TIBC, IRON, RETICCTPCT in the last 72 hours. Urine analysis:    Component Value Date/Time   COLORURINE YELLOW 11/13/2020 0327   APPEARANCEUR HAZY (A) 11/13/2020 0327   LABSPEC 1.010 11/13/2020 0327   PHURINE 6.0 11/13/2020 0327   GLUCOSEU NEGATIVE 11/13/2020 0327   HGBUR MODERATE (A) 11/13/2020 0327   BILIRUBINUR NEGATIVE 11/13/2020 0327   BILIRUBINUR negative 12/17/2016 1140   KETONESUR NEGATIVE 11/13/2020 0327   PROTEINUR NEGATIVE 11/13/2020 0327   UROBILINOGEN 0.2 06/13/2017 1044   NITRITE NEGATIVE 11/13/2020 0327   LEUKOCYTESUR LARGE (A) 11/13/2020 0327   Sepsis Labs: @LABRCNTIP (procalcitonin:4,lacticidven:4)  ) Recent Results (from the past 240 hour(s))  SARS CORONAVIRUS 2 (TAT 6-24 HRS) Nasopharyngeal Nasopharyngeal  Swab     Status: None   Collection Time: 11/13/20  2:54 AM   Specimen: Nasopharyngeal Swab  Result Value Ref Range Status   SARS Coronavirus 2 NEGATIVE NEGATIVE Final    Comment: (NOTE) SARS-CoV-2 target nucleic acids are NOT DETECTED.  The SARS-CoV-2 RNA is generally detectable in upper and lower respiratory specimens during the acute phase of infection. Negative results do not preclude SARS-CoV-2 infection, do not rule out co-infections with other pathogens, and should not be used as the sole basis for treatment or other patient management decisions. Negative results must be combined with clinical observations, patient history, and epidemiological information. The expected result is Negative.  Fact Sheet for Patients: SugarRoll.be  Fact Sheet for Healthcare  Providers: https://www.woods-mathews.com/  This test is not yet approved or cleared by the [place] FDA and  has been authorized for detection and/or diagnosis of SARS-CoV-2 by FDA under an Emergency Use Authorization (EUA). This EUA will remain  in effect (meaning this test can be used) for the duration of the COVID-19 declaration under Se ction 564(b)(1) of the Act, 21 U.S.C. section 360bbb-3(b)(1), unless the authorization is terminated or revoked sooner.  Performed at [facility], [place] [address], [place], [place] [address]          Radiology Studies: No results found.      Scheduled Meds: . amLODipine  10 mg Oral Daily  . atorvastatin  10 mg Oral Daily  . bictegravir-emtricitabine-tenofovir AF  1 tablet Oral Daily  . escitalopram  10 mg Oral Daily  . lisinopril  5 mg Oral Daily  . multivitamin with minerals  1 tablet Oral Daily  . pantoprazole (PROTONIX) IV  40 mg Intravenous Q12H  . predniSONE  40 mg Oral Q breakfast   Continuous Infusions: . 0.9 % NaCl with KCl 20 mEq / L 100 mL/hr at 11/14/20 1226     LOS: 1 day    Time spent: 25 minutes    Dana Allan, MD  [facility] Pager #: [phone] 7PM-7AM contact night coverage as above

## 2020-11-15 ENCOUNTER — Other Ambulatory Visit (HOSPITAL_COMMUNITY): Payer: Self-pay

## 2020-11-15 DIAGNOSIS — K51911 Ulcerative colitis, unspecified with rectal bleeding: Secondary | ICD-10-CM | POA: Diagnosis not present

## 2020-11-15 MED ORDER — PANTOPRAZOLE SODIUM 40 MG PO TBEC: 40.0000 mg | DELAYED_RELEASE_TABLET | Freq: Two times a day (BID) | ORAL | Status: DC

## 2020-11-15 MED ORDER — PREDNISONE 10 MG PO TABS
ORAL_TABLET | ORAL | 1 refills | Status: DC
  Filled 2020-11-15: qty 60, 20d supply, fill #0
  Filled 2020-12-07: qty 60, 20d supply, fill #1

## 2020-11-15 NOTE — Progress Notes (Addendum)
Progress Note  Chief Complaint:  Rectal bleeding. 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    # Left sided UC, admitted with flare symptoms manifested as abdomina pain and hematochezia. CT scan shows circumferential bowel wall thickening of the descending and rectosigmoid colon. She has been off budesonide for several weeks ( she moved, had pharmacy issues, etc)  --Yesterday she was transitioned to Prednisone. Lower abdominal cramping and bleeding continue to improve. Loose stools have improved in frequency and consistency. C-diff not collected since stools are near solid now. She is stable from GI standpoint for discharge home. She will need Prednisone taper of 40 mg daily x 7 days, 30 mg daily x 7 days, 25 mg x 7 days then 20 mg daily until follow up appointment with Dr. Tarri Glenn on 7/20/at 8:30am.  --Will send message to Dr Tarri Glenn to see if she wants to resume Budesonide prior to follow up clinic appointment. .   # [place] anemia. Hgb stable at 10.6.   # Non-healing antral ulcer with dysplasia. Focal HGD on biopsies in November 2021. EUS December 2021 showed persistent antral ulcer.  Biopsies c/w ulcer with intestinal metaplasia and focal atypia in background of inflammation not indefinite for HGD. She has not followed up with Surgery ( multiple family deaths) but does have appt in June.  --? need inpatient EGD since it has been 6 months since last one and she will be seeing Surgery soon.    # PBC / AIH overlap syndrome without cirrhosis.  Followed by Dr. Tarri Glenn         SUBJECTIVE:   Diarrhea resolving. No abdominal pain     OBJECTIVE:    Scheduled inpatient medications:  . amLODipine  10 mg Oral Daily  . atorvastatin  10 mg Oral Daily  . bictegravir-emtricitabine-tenofovir AF  1 tablet Oral Daily  . escitalopram  10 mg Oral Daily  . lisinopril  5 mg  Oral Daily  . multivitamin with minerals  1 tablet Oral Daily  . pantoprazole  40 mg Oral BID  . predniSONE  40 mg Oral Q breakfast   Continuous inpatient infusions:  . 0.9 % NaCl with KCl 20 mEq / L 100 mL/hr at 11/15/20 0843   PRN inpatient medications: acetaminophen **OR** acetaminophen, HYDROcodone-acetaminophen, hyoscyamine, morphine injection, ondansetron **OR** ondansetron (ZOFRAN) IV  Vital signs in last 24 hours: Temp:  [98 F (36.7 C)-98.9 F (37.2 C)] 98 F (36.7 C) (06/01 0606) Pulse Rate:  [62-71] 62 (06/01 0606) Resp:  [16-17] 17 (06/01 0606) BP: (136-160)/(77-88) 160/77 (06/01 0606) SpO2:  [99 %-100 %] 100 % (06/01 0606) Last BM Date: 11/14/20  Intake/Output Summary (Last 24 hours) at 11/15/2020 1033 Last data filed at 11/14/2020 1741 Gross per 24 hour  Intake 1446.39 ml  Output --  Net 1446.39 ml     Physical Exam:  . General: Alert female in NAD . Heart:  Regular rate and rhythm. No lower extremity edema . Pulmonary: Normal respiratory effort . Abdomen: Soft, nondistended, nontender. Normal bowel sounds.  . Neurologic: Alert and oriented . Psych: Pleasant. Cooperative.   There were no vitals filed for this visit.  Intake/Output from previous day: 05/31 0701 - 06/01 0700 In: 1686.4 [P.O.:720; I.V.:966.4] Out: -  Intake/Output this shift: No intake/output data recorded.    Lab Results: Recent Labs    11/12/20  1752 11/13/20 0500 11/13/20 1619 11/14/20 0104  WBC 6.0 5.1  --  5.3  HGB 11.9* 10.0* 11.9* 10.6*  HCT 34.0* 28.8* 34.6* 30.9*  PLT 341 290  --  306   BMET Recent Labs    11/12/20 1752 11/13/20 0500 11/14/20 0104  NA 138 138 134*  K 3.2* 3.7 4.6  CL 106 111 107  CO2 26 23 22   GLUCOSE 121* 103* 150*  BUN 10 6* 9  CREATININE 0.87 0.70 0.78  CALCIUM 9.6 8.5* 8.3*   LFT Recent Labs    11/13/20 0500  PROT 6.9  ALBUMIN 2.9*  AST 33  ALT 21  ALKPHOS 204*  BILITOT 0.6   PT/INR No results for input(s): LABPROT, INR in the  last 72 hours. Hepatitis Panel No results for input(s): HEPBSAG, HCVAB, HEPAIGM, HEPBIGM in the last 72 hours.  No results found.    Principal Problem:   Ulcerative colitis with rectal bleeding (HCC) Active Problems:   HTN (hypertension)   Anemia of chronic disease   Hyperlipidemia   HIV disease ([place])   Peptic ulcer disease   Aortic atherosclerosis ([place])   Colitis     LOS: 2 days   Tye Savoy ,NP 11/15/2020, 10:33 AM  GI ATTENDING  Interval history data reviewed.  Agree with interval progress note as outlined above.  This patient is appropriate for discharge from GI standpoint.  Prednisone taper has been outlined above.  As well, GI follow-up has been arranged.  We will sign off.  Docia Chuck. Geri Seminole., M.D. [facility] Division of Gastroenterology

## 2020-11-15 NOTE — Discharge Summary (Signed)
Physician Discharge Summary  Alyzabeth Pontillo Ridings LZJ:[id number] DOB: [birth year]  PCP: Ladell Pier, MD  Admitted from: Home Discharged to: Home  Admit date: 11/12/2020 Discharge date: 11/15/2020  Recommendations for Outpatient Follow-up:    Follow-up Information    Thornton Park, MD Follow up on 01/03/2021.   Specialty: Gastroenterology Why: at 8:30 am. Please arrive 10 minutes early Contact information: [place] [address] [phone]        Ladell Pier, MD. Schedule an appointment as soon as possible for a visit in 1 week(s).   Specialty: Internal Medicine Why: To be seen with repeat labs (CBC & BMP).  Kindly ensure that patient follows up with general surgery as outpatient.   Contact information: [place] [address] [phone]        Dwan Bolt, MD. Go on 12/06/2020.   Specialty: General Surgery Why: 1:30 PM. Please arrive 30 min prior to appointment time. Bring photo ID and insurance information.  Contact information: [place] [place] [address] [phone]        Tommy Medal, Lavell Islam, MD. Schedule an appointment as soon as possible for a visit in 2 week(s).   Specialty: Infectious Diseases Contact information: [address] [place] [address] [phone]                Home Health: None    Equipment/Devices: None    Discharge Condition: Improved and stable   Code Status: Full Code Diet recommendation:  Discharge Diet Orders (From admission, onward)    Start     Ordered   11/15/20 0000  Diet - low sodium heart healthy        11/15/20 1512           Discharge Diagnoses:  Principal Problem:   Ulcerative colitis with rectal bleeding ([place]) Active Problems:   HTN (hypertension)   Anemia of chronic disease   Hyperlipidemia   HIV disease ([place])   Peptic ulcer disease   Aortic atherosclerosis ([place])   Colitis   Brief Summary: As per H&P done on  admission "  [name] [name] is a 60 y.o. female with medical history significant of cavities, chronic gastric ulcer, GERD, history of GI bleed, grieving (3 close relatives passed away in the last year), history of HIV on antiretrovirals, hyperlipidemia, hypertension, primary biliary cirrhosis, ulcerative colitis who is coming to the emergency department with complaints of 2-week history of progressively worse abdominal pain associated with loose stools with hematochezia then followed by emesis with bloody streaks content over the last 3 days.  Denies flank pain, dysuria, frequency or hematuria.  She denies fever, but has felt chills, fatigue and malaise.  No sore throat, rhinorrhea, wheezing or hemoptysis.  States she had an episode of burning chest pain over a week ago that subsided on its own.  Denies palpitations, dizziness, diaphoresis, PND, orthopnea or pitting edema of the lower extremities.  No polyuria, polydipsia, polyphagia or blurred vision.  ED Course: Initial vital signs were temperature 99.1 F, pulse 73, respirations 16, BP 141/83 mmHg O2 sat 100% on room air.  The patient received IV fluids, Solu-Medrol 20 mg IVPB, Zofran and fentanyl 50 mcg in the emergency department.  Labwork: Urinalysis showed large leukocyte esterase, WBC of more than 50 with a few bacteria and present WBC clumps.  ESR was 65 mm/h.  CBC showed a white count of 6.0 with 57% lymphocytes and 31% neutrophils.  Lipase  was 47.  CRP 3.0 mg/dL.  CMP shows a potassium of 3.2 mmol/L, glucose of 121 mg/dL, albumin of 3.4 g/dL and alkaline phosphatase of 212 units/L.  The rest of the CMP results are within expected values.  Imaging: CT abdomen/pelvis without contrast shows circumferential bowel wall thickening of the descending and rectosigmoid colon.  Recurrent mucoid impaction of the left lower lobe and aortic atherosclerosis.  Please see images and full regular report for further detail."  She was admitted for flare of  left-sided ulcerative colitis with associated abdominal pain and hematochezia.  [facility] consulted and assisted with evaluation and management.   Assessment and plan:  Left-sided ulcerative colitis exacerbation with GI bleeding: [facility] was consulted and assisted with evaluation and management.  She manifested as abdominal pain and hematochezia.  CT abdomen showed circumferential bowel wall thickening of the descending and rectosigmoid colon.  Although budesonide/Entocort is listed on her home med list, she reportedly has not been on it for several weeks.  She was treated supportively with IV steroids and bowel rest.  With this she clinically improved and she was transitioned to oral prednisone on 5/31.  She barely has any abdominal discomfort or pain.  She has minimal streak of blood in stool.  Although she reported up to 6 BMs in the last 24 hours, the stools are extremely low volume/squirts with minimal blood and some mucus as confirmed with RN who was even unable to send a sample for C. difficile for GI panel testing and these have since been canceled due to low index of suspicion and formed stools.  Discussed with GI who recommend prednisone taper 40 mg daily x1 week, 30 mg daily x1 week followed by 20 mg daily until outpatient follow-up with Dr. Tarri Glenn.  As per my discussion with Ms. Tye Savoy, although the appointment is far off, their office will call patient with an early appointment and will also address duration of steroid use/taper etc.  She also recommended continuing patient's prior home dose of sulfasalazine, ursodiol for her PBC and iron supplements.  Since she was not taking budesonide, Ms. Renaee Munda advised to remove it from her med list and Dr. Tarri Glenn will decide regarding resumption during office follow-up.  If patient is going to remain on long-term steroids, may need to consider prophylactic antibiotics for opportunistic infections.  She can follow-up with not only GI but also  her primary ID MD for this.  Nonhealing antral ulcer with dysplasia: As per GI input, focal high-grade dysplasia on biopsies in November 2021.  EUS December 2021 showed persistent antral ulcer.  Biopsies consistent with ulcer with intestinal metaplasia and focal atypia in background of inflammation not indefinite for HGD.  She has not followed up with surgery (multiple family deaths).  I contacted the surgical team and was able to confirm that she does have an appointment which has been reentered in her AVS and patient has been reminded to keep up that appointment and she verbalizes understanding.  GI will determine during outpatient visit if there is need for repeat EGD.  Primary biliary cirrhosis/AIH overlap syndrome without cirrhosis: Followed outpatient by Dr. Tarri Glenn with [facility].  As per GI recommendations, continue ursodiol.  Anemia of acute blood loss and chronic disease: Hemoglobin stable in the 10-11 range.  Outpatient follow-up with repeat CBCs.  Essential hypertension: Mildly uncontrolled at times.  Continue home dose of amlodipine and lisinopril.  HIV disease: Continue Biktarvy.  Outpatient follow-up with Dr. Drucilla Schmidt.  Also to review need  for prophylactic antibiotics for opportunistic infections if long-term steroids are considered.  Peptic ulcer disease: Continue PPI, H2 blockers and rest of management as noted above.  Aortic atherosclerosis/hyperlipidemia Continue statins.  Microscopic hematuria/asymptomatic pyuria Unclear etiology.  No signs and symptoms suggestive of UTI.  May consider repeating urine microscopy in 2 to 3 weeks and if hematuria persists, then may need further evaluation and management as outpatient which may include urology consultation.  This can be pursued with PCP during follow-up   Consultations:  [facility]  Procedures:  None   Discharge Instructions  Discharge Instructions    Call MD for:   Complete by: As directed    Diarrhea or rectal  bleeding.   Call MD for:  difficulty breathing, headache or visual disturbances   Complete by: As directed    Call MD for:  extreme fatigue   Complete by: As directed    Call MD for:  persistant dizziness or light-headedness   Complete by: As directed    Call MD for:  persistant nausea and vomiting   Complete by: As directed    Call MD for:  severe uncontrolled pain   Complete by: As directed    Call MD for:  temperature >100.4   Complete by: As directed    Diet - low sodium heart healthy   Complete by: As directed    Increase activity slowly   Complete by: As directed        Medication List    STOP taking these medications   budesonide 3 MG 24 hr capsule Commonly known as: ENTOCORT EC     TAKE these medications   amLODipine 10 MG tablet Commonly known as: NORVASC Take 1 tablet (10 mg total) by mouth daily. Must have office visit for refills   atorvastatin 10 MG tablet Commonly known as: LIPITOR TAKE 1 TABLET BY MOUTH  DAILY   Biktarvy 50-200-25 MG Tabs tablet Generic drug: bictegravir-emtricitabine-tenofovir AF TAKE 1 TABLET BY MOUTH DAILY.   escitalopram 10 MG tablet Commonly known as: LEXAPRO TAKE 1/2 TABLET BY MOUTH DAILY FOR 30 DAYS THEN INCREASE TO 1 TABLET DAILY AS DIRECTED What changed:   how much to take  how to take this  when to take this   famotidine 20 MG tablet Commonly known as: PEPCID TAKE 1 TABLET BY MOUTH  TWICE DAILY   FeroSul 325 (65 FE) MG tablet Generic drug: ferrous sulfate TAKE 1 TABLET BY MOUTH DAILY WITH BREAKFAST. What changed: how much to take   hyoscyamine 0.125 MG tablet Commonly known as: LEVSIN Take 1 tablet (0.125 mg total) by mouth every 6 (six) hours as needed. What changed:   when to take this  reasons to take this   lisinopril 5 MG tablet Commonly known as: ZESTRIL TAKE 1 TABLET BY MOUTH  DAILY   multivitamin with minerals tablet Take 1 tablet by mouth daily.   pantoprazole 40 MG tablet Commonly known  as: PROTONIX TAKE 1 TABLET BY MOUTH  DAILY   predniSONE 10 MG tablet Commonly known as: DELTASONE Take 4 tabs daily for 1 week, then 3 tabs daily for 1 week, then 2 tabs daily until follow-up with your GI doctor.   sulfaSALAzine 500 MG tablet Commonly known as: AZULFIDINE TAKE 2 TABLETS BY MOUTH  TWICE DAILY   ursodiol 250 MG tablet Commonly known as: ACTIGALL TAKE 1 TABLET BY MOUTH  TWICE DAILY      No Known Allergies    Procedures/Studies: CT ABDOMEN PELVIS WO CONTRAST  Result Date: 11/12/2020 CLINICAL DATA:  Pain EXAM: CT ABDOMEN AND PELVIS WITHOUT CONTRAST TECHNIQUE: Multidetector CT imaging of the abdomen and pelvis was performed following the standard protocol without IV contrast. COMPARISON:  Nov 13, 2017. December 27, 2019 FINDINGS: Evaluation is limited secondary to lack of IV contrast. Lower chest: LEFT lower lobe bronchiectasis with adjacent airspace opacity, mildly increased since 2019. It spans approximately 9 x 12 mm, previously 8 x 10 mm (series 3, image 8). This is overall decreased since 2017. This likely reflects an area of recurring mucoid impaction. Hepatobiliary: Similar appearance of hypodense mass in the LEFT anterior liver and RIGHT posterior liver, previously characterized as benign hepatic cysts. Gallbladder is unremarkable. Pancreas: No peripancreatic fat stranding. Spleen: Unremarkable. Adrenals/Urinary Tract: Adrenal glands are unremarkable. No hydronephrosis. No obstructing nephrolithiasis. Punctate nonobstructive LEFT-sided nephrolithiasis. Bladder is unremarkable. Stomach/Bowel: Small to moderate hiatal hernia. No evidence of bowel obstruction. Contiguous circumferential bowel wall thickening of the descending and rectosigmoid colon. Appendix normal. Vascular/Lymphatic: Severe atherosclerotic calcifications of the aorta. No suspicious lymph nodes. Reproductive: Status post hysterectomy. No adnexal masses. Other: Trace free fluid. Musculoskeletal: No acute or  significant osseous findings. IMPRESSION: 1. There is circumferential bowel wall thickening of the descending and rectosigmoid colon. This likely reflects patient's reported history of ulcerative colitis but could reflect an additional nonspecific colitis. Recommend correlation with colonoscopy history. 2. Recurrent mucoid impaction of the LEFT lower lobe. Aortic Atherosclerosis (ICD10-I70.0). Electronically Signed   By: Valentino Saxon MD   On: 11/12/2020 18:36      Subjective: Patient reports that barely has any abdominal discomfort or pain.  She has minimal streak of blood in stool.  Although she reported up to 6 BMs in the last 24 hours, the stools are extremely low volume/squirts of soft/loose stool with minimal blood and some mucus as confirmed with RN who was even unable to send a sample for C. difficile for GI panel testing.  Tolerating diet.  Asking if she can go home.  Discharge Exam:  Vitals:   11/14/20 1358 11/14/20 2041 11/15/20 0606 11/15/20 1359  BP: 136/82 (!) 160/88 (!) 160/77 (!) 147/82  Pulse: 69 71 62 74  Resp: 16 17 17 17   Temp: 98.3 F (36.8 C) 98.9 F (37.2 C) 98 F (36.7 C) 98.4 F (36.9 C)  TempSrc: Oral Oral Oral Oral  SpO2: 99% 100% 100% 100%    General: Pt lying comfortably in bed & appears in no obvious distress. Cardiovascular: S1 & S2 heard, RRR, S1/S2 +. No murmurs, rubs, gallops or clicks. No JVD or pedal edema.  Telemetry personally reviewed: Sinus rhythm. Respiratory: Clear to auscultation without wheezing, rhonchi or crackles. No increased work of breathing. Abdominal:  Non distended, non tender & soft. No organomegaly or masses appreciated. Normal bowel sounds heard. CNS: Alert and oriented. No focal deficits. Extremities: no edema, no cyanosis    The results of significant diagnostics from this hospitalization (including imaging, microbiology, ancillary and laboratory) are listed below for reference.     Microbiology: Recent Results (from  the past 240 hour(s))  SARS CORONAVIRUS 2 (TAT 6-24 HRS) Nasopharyngeal Nasopharyngeal Swab     Status: None   Collection Time: 11/13/20  2:54 AM   Specimen: Nasopharyngeal Swab  Result Value Ref Range Status   SARS Coronavirus 2 NEGATIVE NEGATIVE Final    Comment: (NOTE) SARS-CoV-2 target nucleic acids are NOT DETECTED.  The SARS-CoV-2 RNA is generally detectable in upper and lower respiratory specimens during the acute phase of infection. Negative  results do not preclude SARS-CoV-2 infection, do not rule out co-infections with other pathogens, and should not be used as the sole basis for treatment or other patient management decisions. Negative results must be combined with clinical observations, patient history, and epidemiological information. The expected result is Negative.  Fact Sheet for Patients: SugarRoll.be  Fact Sheet for Healthcare Providers: https://www.woods-mathews.com/  This test is not yet approved or cleared by the [place] FDA and  has been authorized for detection and/or diagnosis of SARS-CoV-2 by FDA under an Emergency Use Authorization (EUA). This EUA will remain  in effect (meaning this test can be used) for the duration of the COVID-19 declaration under Se ction 564(b)(1) of the Act, 21 U.S.C. section 360bbb-3(b)(1), unless the authorization is terminated or revoked sooner.  Performed at [facility], [place] [address], [place], [place] [address]      Labs: CBC: Recent Labs  Lab 11/12/20 1752 11/13/20 0500 11/13/20 1619 11/14/20 0104  WBC 6.0 5.1  --  5.3  NEUTROABS 1.9  --   --   --   HGB 11.9* 10.0* 11.9* 10.6*  HCT 34.0* 28.8* 34.6* 30.9*  MCV 88.3 88.6  --  88.0  PLT 341 290  --  902    Basic Metabolic Panel: Recent Labs  Lab 11/12/20 1752 11/13/20 0500 11/14/20 0104  NA 138 138 134*  K 3.2* 3.7 4.6  CL 106 111 107  CO2 26 23 22   GLUCOSE 121* 103* 150*  BUN 10 6* 9  CREATININE  0.87 0.70 0.78  CALCIUM 9.6 8.5* 8.3*  MG  --  1.7  --   PHOS  --  3.3  --     Liver Function Tests: Recent Labs  Lab 11/12/20 1752 11/13/20 0500  AST 30 33  ALT 22 21  ALKPHOS 212* 204*  BILITOT 0.8 0.6  PROT 7.9 6.9  ALBUMIN 3.4* 2.9*     Urinalysis    Component Value Date/Time   COLORURINE YELLOW 11/13/2020 0327   APPEARANCEUR HAZY (A) 11/13/2020 0327   LABSPEC 1.010 11/13/2020 0327   PHURINE 6.0 11/13/2020 0327   GLUCOSEU NEGATIVE 11/13/2020 0327   HGBUR MODERATE (A) 11/13/2020 0327   BILIRUBINUR NEGATIVE 11/13/2020 0327   BILIRUBINUR negative 12/17/2016 1140   KETONESUR NEGATIVE 11/13/2020 0327   PROTEINUR NEGATIVE 11/13/2020 0327   UROBILINOGEN 0.2 06/13/2017 1044   NITRITE NEGATIVE 11/13/2020 0327   LEUKOCYTESUR LARGE (A) 11/13/2020 0327      Time coordinating discharge: 35 minutes  SIGNED:  Vernell Leep, MD, FACP, [facility]. [facility]  To contact the attending provider between 7A-7P or the covering provider during after hours 7P-7A, please log into the web site [email or web address] and access using universal [place] password for that web site. If you do not have the password, please call the hospital operator.

## 2020-11-15 NOTE — Telephone Encounter (Signed)
Pt scheduled for follow up with you as follows:  Next Appt With Gastroenterology Thornton Park, MD) 01/03/2021 at 8:30 AM  Please advise if you would prefer I work her in upon your return.

## 2020-11-15 NOTE — Progress Notes (Signed)
Patient discharged to home with instructions. 

## 2020-11-15 NOTE — Discharge Instructions (Signed)

## 2020-11-16 ENCOUNTER — Telehealth: Payer: Self-pay

## 2020-11-16 NOTE — Telephone Encounter (Signed)
Transition Care Management Follow-up Telephone Call  Date of discharge and from where:[facility] on 11/15/2020 How have you been since you were released from the hospital? Feeling great  Any questions or concerns? No questions/concerns reported.  Items Reviewed: Did the pt receive and understand the discharge instructions provided? have the instructions and have no questions.  Medications obtained and verified? She said  have the medication list  and the hospital staff reviewed them in detail prior to discharge. She said that she has all of the medications and they have no questions.  Any new allergies since your discharge? None reported  Do you have support at home? Yes, daughter  Other (ie: DME, Home Health, etc)       Functional Questionnaire: (I = Independent and D = Dependent) ADL's:  Independent.      Follow up appointments reviewed:   PCP Hospital f/u appt confirmed? Dr Wynetta Emery on 12/19/2020@ 1050.  [facility] f/u appt confirmed? None scheduled at this time  Are transportation arrangements needed? have transportation   If their condition worsens, is the pt aware to call  their PCP or go to the ED? Yes.Made pt aware if condition worsen or start experiencing rapid weight gain, chest pain, diff breathing, SOB, high fevers, or bleading to refer imediately to ED for further evaluation.  Was the patient provided with contact information for the PCP's office or ED? He has the phone number  Was the pt encouraged to call back with questions or concerns?yes

## 2020-11-21 ENCOUNTER — Other Ambulatory Visit (HOSPITAL_COMMUNITY): Payer: Self-pay

## 2020-11-27 ENCOUNTER — Other Ambulatory Visit (HOSPITAL_COMMUNITY): Payer: Self-pay

## 2020-11-27 ENCOUNTER — Telehealth: Payer: Self-pay

## 2020-11-27 NOTE — Telephone Encounter (Signed)
Attempted to call pt, unable to reach her or leave a message. States pts voicemail is full and cannot accept new messages. Will try again.  11/29/20 attempted to call pt again and received same message as above.  11/30/20 attempted to call pt again and same message received.

## 2020-11-27 NOTE — Telephone Encounter (Signed)
-----   Message from Thornton Park, MD sent at 11/27/2020  8:20 AM EDT ----- Please have her resume budesonide 75m daily while she tapers the prednisone.  Thank you.  KLB ----- Message ----- From: GWillia Craze NP Sent: 11/15/2020  10:54 AM EDT To: KThornton Park MD  Hi,  Patient being discharged after admission for UC flare. Going home on prednisone taper ( please see my hospital note). Has office appt with you 7/20. Please let her know if you want to resume Budesonide before office follow up. She stopped it a few months back.      Regarding chronic gastric ulcer, she has appt with surgery mid June. If surgery is done she will hopefully be off steroids by then  Thanks,  PG

## 2020-11-28 ENCOUNTER — Other Ambulatory Visit (HOSPITAL_COMMUNITY): Payer: Self-pay

## 2020-11-30 ENCOUNTER — Ambulatory Visit: Payer: Medicare Other | Admitting: Infectious Disease

## 2020-12-05 NOTE — Telephone Encounter (Signed)
Left message for pt to call back  °

## 2020-12-06 DIAGNOSIS — K31A0 Gastric intestinal metaplasia, unspecified: Secondary | ICD-10-CM | POA: Diagnosis not present

## 2020-12-06 NOTE — Telephone Encounter (Signed)
Have attempted to reach pt by phone multiple times and left multiple messages. Letter mailed requesting pt to call the office.

## 2020-12-07 ENCOUNTER — Other Ambulatory Visit (HOSPITAL_COMMUNITY): Payer: Self-pay

## 2020-12-08 ENCOUNTER — Other Ambulatory Visit (HOSPITAL_COMMUNITY): Payer: Self-pay

## 2020-12-08 MED FILL — Bictegravir-Emtricitabine-Tenofovir AF Tab 50-200-25 MG: ORAL | 30 days supply | Qty: 30 | Fill #2 | Status: AC

## 2020-12-12 ENCOUNTER — Other Ambulatory Visit (HOSPITAL_COMMUNITY): Payer: Self-pay

## 2020-12-13 ENCOUNTER — Other Ambulatory Visit: Payer: Self-pay

## 2020-12-13 NOTE — Progress Notes (Signed)
The proposed treatment discussed in conference is for discussion purpose only and is not a binding recommendation.  The patients have not been physically examined, or presented with their treatment options.  Therefore, final treatment plans cannot be decided.  

## 2020-12-15 ENCOUNTER — Other Ambulatory Visit (HOSPITAL_COMMUNITY): Payer: Self-pay

## 2020-12-19 ENCOUNTER — Ambulatory Visit: Payer: Medicare Other | Attending: Internal Medicine | Admitting: Internal Medicine

## 2020-12-19 ENCOUNTER — Other Ambulatory Visit: Payer: Self-pay

## 2020-12-19 DIAGNOSIS — Z91199 Patient's noncompliance with other medical treatment and regimen due to unspecified reason: Secondary | ICD-10-CM

## 2020-12-19 DIAGNOSIS — Z5329 Procedure and treatment not carried out because of patient's decision for other reasons: Secondary | ICD-10-CM

## 2020-12-19 NOTE — Progress Notes (Signed)
Pt no-showed for this appt.

## 2020-12-21 ENCOUNTER — Other Ambulatory Visit: Payer: Self-pay

## 2020-12-21 ENCOUNTER — Other Ambulatory Visit (HOSPITAL_COMMUNITY): Payer: Self-pay

## 2020-12-21 ENCOUNTER — Telehealth: Payer: Self-pay | Admitting: Gastroenterology

## 2020-12-21 MED ORDER — BUDESONIDE 3 MG PO CPEP
9.0000 mg | ORAL_CAPSULE | Freq: Every day | ORAL | 2 refills | Status: DC
  Filled 2020-12-21: qty 90, 30d supply, fill #0
  Filled 2021-02-20: qty 90, 30d supply, fill #1
  Filled 2021-05-16: qty 90, 30d supply, fill #2

## 2020-12-21 NOTE — Telephone Encounter (Signed)
Discussed with pt that she probably passed some blood due to her UC. Pt was mailed a letter to contact the office as we had been unable to reach her by phone. Discussed with pt that she needed to start on budesonide 65m daily. Script sent to pharmacy and pt aware.

## 2020-12-21 NOTE — Telephone Encounter (Signed)
Patient called states she is highly concerned because she went to the bathroom and squirted out something that looked like a piece of meat with blood or a blood clot or something.

## 2020-12-25 ENCOUNTER — Other Ambulatory Visit (HOSPITAL_COMMUNITY): Payer: Self-pay

## 2020-12-28 ENCOUNTER — Other Ambulatory Visit (HOSPITAL_COMMUNITY): Payer: Self-pay

## 2020-12-28 MED FILL — Bictegravir-Emtricitabine-Tenofovir AF Tab 50-200-25 MG: ORAL | 30 days supply | Qty: 30 | Fill #3 | Status: AC

## 2021-01-02 ENCOUNTER — Other Ambulatory Visit (HOSPITAL_COMMUNITY): Payer: Self-pay

## 2021-01-03 ENCOUNTER — Encounter: Payer: Self-pay | Admitting: Gastroenterology

## 2021-01-03 ENCOUNTER — Other Ambulatory Visit (INDEPENDENT_AMBULATORY_CARE_PROVIDER_SITE_OTHER): Payer: Medicare Other

## 2021-01-03 ENCOUNTER — Ambulatory Visit (INDEPENDENT_AMBULATORY_CARE_PROVIDER_SITE_OTHER): Payer: Medicare Other | Admitting: Gastroenterology

## 2021-01-03 VITALS — BP 120/60 | HR 80 | Ht 67.0 in | Wt 113.1 lb

## 2021-01-03 DIAGNOSIS — K5100 Ulcerative (chronic) pancolitis without complications: Secondary | ICD-10-CM

## 2021-01-03 DIAGNOSIS — R932 Abnormal findings on diagnostic imaging of liver and biliary tract: Secondary | ICD-10-CM | POA: Diagnosis not present

## 2021-01-03 DIAGNOSIS — K259 Gastric ulcer, unspecified as acute or chronic, without hemorrhage or perforation: Secondary | ICD-10-CM | POA: Diagnosis not present

## 2021-01-03 DIAGNOSIS — K743 Primary biliary cirrhosis: Secondary | ICD-10-CM

## 2021-01-03 DIAGNOSIS — K921 Melena: Secondary | ICD-10-CM

## 2021-01-03 LAB — PROTIME-INR
INR: 1 ratio (ref 0.8–1.0)
Prothrombin Time: 11.3 s (ref 9.6–13.1)

## 2021-01-03 LAB — COMPREHENSIVE METABOLIC PANEL
ALT: 15 U/L (ref 0–35)
AST: 21 U/L (ref 0–37)
Albumin: 4.1 g/dL (ref 3.5–5.2)
Alkaline Phosphatase: 135 U/L — ABNORMAL HIGH (ref 39–117)
BUN: 19 mg/dL (ref 6–23)
CO2: 28 mEq/L (ref 19–32)
Calcium: 9.5 mg/dL (ref 8.4–10.5)
Chloride: 106 mEq/L (ref 96–112)
Creatinine, Ser: 0.82 mg/dL (ref 0.40–1.20)
GFR: 76.96 mL/min (ref >60.00)
Glucose, Bld: 102 mg/dL — ABNORMAL HIGH (ref 70–99)
Potassium: 3.9 mEq/L (ref 3.5–5.1)
Sodium: 141 mEq/L (ref 135–145)
Total Bilirubin: 0.5 mg/dL (ref 0.2–1.2)
Total Protein: 7.5 g/dL (ref 6.0–8.3)

## 2021-01-03 LAB — IBC + FERRITIN
Ferritin: 12.3 ng/mL (ref 10.0–291.0)
Iron: 58 ug/dL (ref 42–145)
Saturation Ratios: 13.1 % — ABNORMAL LOW (ref 20.0–50.0)
Transferrin: 317 mg/dL (ref 212.0–360.0)

## 2021-01-03 LAB — CBC
HCT: 26 % — ABNORMAL LOW (ref 36.0–46.0)
Hemoglobin: 8.8 g/dL — ABNORMAL LOW (ref 12.0–15.0)
MCHC: 34 g/dL (ref 30.0–36.0)
MCV: 92.8 fl (ref 78.0–100.0)
Platelets: 513 10*3/uL — ABNORMAL HIGH (ref 150.0–400.0)
RBC: 2.8 Mil/uL — ABNORMAL LOW (ref 3.87–5.11)
RDW: 15.8 % — ABNORMAL HIGH (ref 11.5–15.5)
WBC: 7.6 10*3/uL (ref 4.0–10.5)

## 2021-01-03 LAB — IRON: Iron: 58 ug/dL (ref 42–145)

## 2021-01-03 NOTE — Progress Notes (Signed)
Following message sent to Rhys Martini and April Pait:  [facility] Phone: [phone] Fax: [phone]   Imaging Ordered: MRI with EOVIST NEEDED AUGUST 2022  Diagnosis: Biliary Cirrhosis  Ordering Provider: Dr. Tarri Glenn  Is a Prior Authorization needed? We are in the process of obtaining it now  Is the patient Diabetic? No  Does the patient have Hypertension? No  Does the patient have any implanted devices or hardware? No  Date of last BUN/Creat, if needed? Ordered today  Patient Weight? 113#  Is the patient able to get on the table? Yes  Has the patient been diagnosed with COVID? No  Is the patient waiting on COVID testing results? No  Thank you for your assistance! [facility]

## 2021-01-03 NOTE — Patient Instructions (Addendum)
It was my pleasure to provide care to you today. Based on our discussion, I am providing you with my recommendations below:  RECOMMENDATION(S):   Continue to avoid all NSAIDs Continue Budesonide, Pantoprazole and Famotidine as prescribed. At your request, we did not send in refills of these medications today  OVER THE COUNTER MEDICATION(S):   Please purchase the following medications over the counter and take as directed:  Miralax - please dissolve 1 capful in 8 ounces of water or juice every day beginning today and up until your procedure  IMAGING:  You will be contacted by [facility] (Your caller ID will indicate phone # [phone]) within the next business 7-10 business days to schedule your MRI. If you have not heard from them within 7-10 business days, please call [facility] at [phone] to follow up on the status of your appointment.     LABS:   Please proceed to the basement level for lab work before leaving today. Press "B" on the elevator. The lab is located at the first door on the left as you exit the elevator.  HEALTHCARE LAWS AND MY CHART RESULTS:   Due to recent changes in healthcare laws, you may see results of your imaging and/or laboratory studies on MyChart before I have had a chance to review them.  I understand that in some cases there may be results that are confusing or concerning to you. Please understand that not all results are received at same time and often I may need to interpret multiple results in order to provide you with the best plan of care or course of treatment. Therefore, I ask that you please give me 48 hours to thoroughly review all your results before contacting my office for clarification.   COLONOSCOPY AND ENDOSCOPY:   You have been scheduled for an endoscopy and a colonoscopy. Please follow the written instructions given to you at your visit today.  PREP:   Please pick up your prep supplies at the  pharmacy within the next 1-3 days.  INHALERS:   If you use inhalers (even only as needed), please bring them with you on the day of your procedure.  COLONOSCOPY TIPS:  To reduce nausea and dehydration, stay well hydrated for 3-4 days prior to the exam.  To prevent skin/hemorrhoid irritation - prior to wiping, put A&Dointment or vaseline on the toilet paper. Keep a towel or pad on the bed.  BEFORE STARTING YOUR PREP, drink  64oz of clear liquids in the morning. This will help to flush the colon and will ensure you are well hydrated!!!!  NOTE - This is in addition to the fluids required for to complete your prep. Use of a flavored hard candy, such as grape Anise Salvo, can counteract some of the flavor of the prep and may prevent some nausea.   FOLLOW UP:  After your procedure, you will receive a call from my office staff regarding my recommendation for follow up.  BMI:  If you are age 30 or younger, your body mass index should be between 19-25. Your Body mass index is 17.72 kg/m. If this is out of the aformentioned range listed, please consider follow up with your Primary Care Provider.   MY CHART:  The [facility] providers would like to encourage you to use [facility] to communicate with providers for non-urgent requests or questions.  Due to long hold times on the telephone, sending your provider a message by [facility] may be a  faster and more efficient way to get a response.  Please allow 48 business hours for a response.  Please remember that this is for non-urgent requests.   Thank you for trusting me with your gastrointestinal care!    Thornton Park, MD, MPH

## 2021-01-03 NOTE — Progress Notes (Signed)
Referring Provider: Ladell Pier, MD Primary Care Physician:  Ladell Pier, MD  Chief complaint: gastric ulcer, recent hospitalization for UC   IMPRESSION:  Nonhealing gastric antral ulcer with dysplasia and focal atypia    - Initially diagnosed on EGD 03/31/19    - biopsies negative for H pylori    - gastrin 148 on PPI 11/02/19    - gastrcin 176 on PPI 05/25/21    - gastrin 238 on PPI 01/03/2021 Intestinal metaplasia on EGD 4/21 and confirmed on EUS 12/21 Left-sided ulcerative colitis with recent hospitalization for flaire    - Random colon biopsies 12/16/2007: diffuse active chronic colitis, possibly UC    - Random colon biopsies 12/23/13: diffuse chronic active colitis - idiopathic IBD    - Abnormal CT scan 04/2014: thickened left colon    - hospitalized for colitis in [place] 05/2014    - Colonoscopy 03/31/2019 shows pancolonic pseudopolyps but no active colitis    - CT scan 10/2020 showed active colitis PBC with autoimmune hepatitis overlap with abnormal liver enzymes    -  Alk phos elevated dating back to 2015      - magnitude of elevation has increased in the last couple of years.          - Transaminases are mildly elevated. Total bilirubin is normal.     - Liver biopsy 04/20/14: chronic portal hepatitis with marked duct injury c/w PBC    - 2015: Stage 2-3 PBC of 4. Reviewed at [facility] 03/01/2019: ANA 1:1280, IgG 1793, AMA 215.5, IgM 121    - Liver biopsy 03/15/2019 showed 2-3 fibrosis    - Abdominal imaging has suggested cirrhosis    - FIB-4 1.56 and APRI 0.450 do not support a diagnosis of cirrhosis    - Labs 11/02/19: IgM 1486, alk phos 202 GERD with a small hiatal hernia Abnormal ultrasound and liver MRI    - CT a/p with contrast at [facility] 11/13/17:        - enteritis, mild gastritis, mild hepatomegaly, liver lesions    - ultrasound 05/08/18: 1.3 cm solid mass in the left liver lobe       - 0.6 cm irregular hypoechoic mass in the right liver  lobe       - 1.2 cm benign appearing cyst in the right liver lobe    - MRI 05/30/18: suspected cirrhosis       - 5 small lesion in the liver, likely small cysts    - MRI/MRCP 03/17/2019: cirrhosis, no liver mass, normal spleen, no ascites    - MRI 12/27/19: Very mild changes of cirrhosis. No mass. Small cysts in the liver and kidneys HIV on BIKTARVY  Family history of stomach cancer (Father)  Nonhealing gastric ulcer with intestinal metaplasia and now dysplasia, family history of gastric cancer: No improvement in gastric ulcer despite maximum medical therapy for many months. Gastric biopsies negative for syphilis, TB, CMV, IgG4 sclerosing disease.  She denied the use of NSAIDs. Gastrin level has been slowly increasing, although always obtained on PPI therapy.  New dysplasia is worrisome for malignancy. Her care was discussed at tumor board. Per Dr. Ardis Hughs, who attended the conference, "There is concern by pathology about a possible over read of high-grade dysplasia and they admitted with significant inflammation present this is quite hard to call definitively.  Dr. Michaelle Birks and pathology both wonder about upper tract IBD and perhaps she has Crohn's, not ulcerative colitis.  I think she is a scheduled to see you again in follow-up and seems worthwhile to at least look into her stomach again at the ulcer, possibly colon and get a handle on whethershe has active inflammation there."  No IBD seen on prior upper endoscopy despite multiple biopsies, but, will plan an additional EGD and concurrent colonoscopy.   Cirrhosis without history of decompensation: Labs recommended to monitor response to ursodeoxycholic acid and budesonide for PBC with autoimmune hepatitis overlap.  There is some fibrosis but no evidence on biopsy for advanced fibrosis.   PBC and autoimmune hepatitis: IgG has is increasing after her hiatus off budesonide. Continue ursodeoxycholic acid, resume budesonide. Continue to follow IgG  levels.    Ulcerative colitis: Colonoscopy to restage. Continue budesonide. May need to consider addition of biologic therapy.   Abnormal MRI: Given her prior abnormal MRI and suspected underlying advanced fibrosis/early cirrhosis, will need high quality cross-sectional imaging every 6 months given the risk of liver cancer. Follow serial AFP levels.   PLAN: - Continue to avoid all NSAIDs - Continue UDCA at a total daily dose of 13 to 15 mg/kg for 250 mg 2 tablets BID - Continue Budesonde 9 mg daily - Continue pantoprazole 40 mg twice daily and famotidine 20 mg BID - CMP, IgG, CBC, AFP, PT/INR - Iron, ferritin, gastrin - QuantiFERON TB Gold, HBsAg, HbcAb, HCV Ab - MRI with [facility] in August 2022 to follow-up on her abnormal 06/2020 - Reviewed strategies to love the liver including abstinence from alcohol - EGD and colonoscopy with a 2 day bowel prep - Consider referral to tertiary care center if we are unable to make progress in treating her gastric ulcer  Please see the "Patient Instructions" section for addition details about the plan.   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 She has HTN, HL, IDA, GERD, LGSIL with positive HPV, former tobacco, HIV on BIKTARVY, cavitary lung lesion. History of substance abuse. Recent psychosocial stressors with the death of oldest brother and sister-in-law.   She was hospitalized in May for an ulcerative colitis flare after she ran out of her budesonide..  Symptoms improved with Solumedrol and she was discharged on oral prendisone. . She did not have a colonoscopy. No fecal calprotectin or stool studies obtained.   CT was hospitalized showed colitis involving the descending and rectosigmoid colon.  Bowel habits have returned to normal. She is having 3-4 BM daily. Bleeding has subsided.   EGD 04/27/20 showed persistent gastric ulcers despite aggressive medical therapy. Biopsies  Ulcer margin biopsies show  focal high-grade dysplasia arising in a chronic gastritis and intestinal metaplasia. No Helicobacter pylori.   EUS 06/15/20 to evaluate the non-healing gastric ulcer: Persistent clean based gastric ulcer measuring 6-28m with associated puckered adjacent gastric mucosa. No clearly neoplastic appearing. No linitis plastica. No perigastric adenopathy. Gastric biopsies show intestinal metaplasia and atypia.   Based on EUS, she was referred to general surgery to consider distal gastrectomy for nonhealing antral ulcer.    Recent endoscopic history:  - Colonoscopy was performed 03/31/2019 given her history of ulcerative colitis.  She had pancolonic pseudopolyps and hemorrhoids.  Stool in the entire examined colon limited this from being a high quality screening colonoscopy.  Biopsies from the terminal ileum and throughout the colon were completely normal. - EGD 03/31/19 showed a small hiatal hernia, 12 mm cratered gastric antral ulcer.  Esophageal biopsies showed reflux without eosinophilic esophagitis or Barrett's.  She had chronic  inactive gastritis without H. pylori.  Duodenal biopsies were normal.  - EGD 06/17/19: 10 mm antral ulcer,  No portal hypertensive gastropathy. No esophageal or gastric varices. Gastric biopsies show active chronic gastritis with ulceration and prominent cytologic atypia, indefinite for dysplasia. There is moderate cytologic atypia with nuclear enlargement and hyperchromasia. Immunohistochemical stain for p53 shows focal overexpression but a definite clonal overexpression pattern is not seen. The changes are interpreted as indefinite for dysplasia but a reactive change may be favored due to presence of prominent acute inflammation.  - EGD 10/13/19: Two non-bleeding cratered gastric ulcers, the largest 55m, the smaller 133m Multiple antral erosions. Small hiatal hernia. Gastric biopsies with intestinal metaplasia and cytologic atypia. Intestinal metaplasia.  - EGD 04/27/20 showed  persistent gastric ulcers despite aggressive medical therapy. Biopsies  Ulcer margin biopsies show focal high-grade dysplasia arising in a chronic gastritis and intestinal metaplasia. No Helicobacter pylori.  - EUS 06/15/20 to evaluate the non-healing gastric ulcer: Persistent clean based gastric ulcer measuring 6-64m80mith associated puckered adjacent gastric mucosa. No clearly neoplastic appearing. No linitis plastica. No perigastric adenopathy. Gastric biopsies show intestinal metaplasia and atypia.   Recent abdominal imaging:  - MRI/MRCP 03/17/2019 showed cirrhosis without liver mass.  Spleen was normal.  No ascites. - CT abd/pelvis without contrast 11/12/20: colitis involving the descending and rectosigmoid colon  Prior records from outside: Liver biopsy 04/20/14: chronic portal hepatitis pattern of injury with marked duct injury, consistent with PBC. Stage 2-3 of [facility]. [facility]: diffuse active chronic colitis, possibly UC Random colon biopsies 12/23/13: diffuse chronic active colitis compatible with idiopathic inflammatory bowel disorder;    Labs obtained today: Labs 01/03/2021: Sodium 141, glucose 102, albumin 4.1, AST 21, ALT 15, alk phos 135, gastrin 238, iron 58, ferritin 12.3, IgG 1657, white count 7.6, hemoglobin 8.8, platelets 513, INR 1.0   Past Medical History:  Diagnosis Date   Cavities 02/02/2015   Chronic gastric ulcer    GERD (gastroesophageal reflux disease) Dx 2014   GI bleed    Grieving 05/04/2020   [place]   Hyperlipidemia    Hypertension Dx 2014   [place] cirrhosis ([place]17/[place]   [place] ([place]  Quit 2008   [facility]   Past Surgical History:  Procedure Laterality Date   [facility]DOMINAL [facility]Y  2001    [facility]   BIOPSY  04/27/2020   Procedure: BIOPSY;  Surgeon: BeaThornton ParkMD;  [facility];  Service: Gastroenterology;;   BIOPSY  06/15/2020   Procedure: BIOPSY;  Surgeon: JacMilus BanisterD;  [facility];  Service: Gastroenterology;;   COLONOSCOPY     ESOPHAGOGASTRODUODENOSCOPY     ESOPHAGOGASTRODUODENOSCOPY (EGD) WITH PROPOFOL N/A 04/27/2020   Procedure: ESOPHAGOGASTRODUODENOSCOPY (EGD) WITH PROPOFOL;  Surgeon: BeaThornton ParkD;  [facility];  Service: Gastroenterology;  Laterality: N/A;   ESOPHAGOGASTRODUODENOSCOPY (EGD) WITH PROPOFOL N/A 06/15/2020   Procedure: ESOPHAGOGASTRODUODENOSCOPY (EGD) WITH PROPOFOL;  Surgeon: JacMilus BanisterD;  [facility];  Service: Gastroenterology;  Laterality: N/A;   TEE WITHOUT CARDIOVERSION N/A 05/02/2014   Procedure: TRANSESOPHAGEAL ECHOCARDIOGRAM (TEE);  Surgeon: DalLarey DresserD;  [facility];  Laterality: N/A;   UPPER ESOPHAGEAL ENDOSCOPIC ULTRASOUND (EUS) N/A 06/15/2020   Procedure: UPPER ESOPHAGEAL ENDOSCOPIC ULTRASOUND (EUS);  Surgeon: JacMilus BanisterD;  [facility];  Service: Gastroenterology;  Laterality: N/A;   VIDEO BRONCHOSCOPY Bilateral 04/27/2014   Procedure: VIDEO BRONCHOSCOPY WITH FLUORO;  Surgeon: Rigoberto Noel, MD;  [facility];  Service: Cardiopulmonary;  Laterality: Bilateral;    Current Outpatient Medications  Medication Sig Dispense Refill   amLODipine (NORVASC) 10 MG tablet Take 1 tablet (10 mg total) by mouth daily. Must have office visit for refills 30 tablet 0   atorvastatin (LIPITOR) 10 MG tablet TAKE 1 TABLET BY MOUTH  DAILY (Patient taking differently: Take 10 mg by mouth daily.) 90 tablet 1   bictegravir-emtricitabine-tenofovir AF (BIKTARVY) 50-200-25 MG TABS tablet TAKE 1 TABLET BY MOUTH DAILY. 30 tablet 11   budesonide (ENTOCORT EC) 3 MG 24 hr capsule Take 3 capsules (9 mg total) by mouth daily. 90 capsule 2   escitalopram (LEXAPRO) 10 MG tablet TAKE 1/2 TABLET BY MOUTH DAILY FOR  30 DAYS THEN INCREASE TO 1 TABLET DAILY AS DIRECTED (Patient taking differently: Take 10 mg by mouth daily.) 30 tablet 11   famotidine (PEPCID) 20 MG tablet TAKE 1 TABLET BY MOUTH  TWICE DAILY (Patient taking differently: Take 20 mg by mouth 2 (two) times daily.) 180 tablet 3   FEROSUL 325 (65 Fe) MG tablet TAKE 1 TABLET BY MOUTH DAILY WITH BREAKFAST. (Patient taking differently: Take 325 mg by mouth daily with breakfast.) 90 tablet 3   hyoscyamine (LEVSIN, ANASPAZ) 0.125 MG tablet Take 1 tablet (0.125 mg total) by mouth every 6 (six) hours as needed. (Patient taking differently: Take 0.125 mg by mouth every 4 (four) hours as needed for bladder spasms or cramping.) 120 tablet 0   lisinopril (ZESTRIL) 5 MG tablet TAKE 1 TABLET BY MOUTH  DAILY (Patient taking differently: Take 5 mg by mouth daily.) 90 tablet 1   Multiple Vitamins-Minerals (MULTIVITAMIN WITH MINERALS) tablet Take 1 tablet by mouth daily.     pantoprazole (PROTONIX) 40 MG tablet TAKE 1 TABLET BY MOUTH  DAILY (Patient taking differently: Take 40 mg by mouth daily.) 90 tablet 3   predniSONE (DELTASONE) 10 MG tablet Take 4 tabs daily for 1 week, then 3 tabs daily for 1 week, then 2 tabs daily until follow-up with your GI doctor. 60 tablet 1   sulfaSALAzine (AZULFIDINE) 500 MG tablet TAKE 2 TABLETS BY MOUTH  TWICE DAILY (Patient taking differently: Take 1,000 mg by mouth 2 (two) times daily.) 360 tablet 3   ursodiol (ACTIGALL) 250 MG tablet TAKE 1 TABLET BY MOUTH  TWICE DAILY (Patient taking differently: Take 250 mg by mouth 2 (two) times daily.) 180 tablet 3   No current facility-administered medications for this visit.    Allergies as of 01/03/2021   (No Known Allergies)    Family History  Problem Relation Age of Onset   Diabetes Mother    Stroke Mother    Cancer Father        ?   Alcoholism Father    Pancreatitis Sister    Diabetes Sister    Prostate cancer Brother    Colon cancer Brother    Colon cancer Paternal Uncle 27    Bipolar disorder Daughter    Esophageal cancer Neg Hx    Rectal cancer Neg Hx    Stomach cancer Neg Hx        Physical Exam: General:   Alert,  well-nourished, pleasant and cooperative in NAD.  Head:  Normocephalic and atraumatic. Eyes:  Sclera clear, no icterus.   Conjunctiva pink. Heart:  Regular rate and rhythm; no murmurs. Abdomen:  Thin, soft, nontender,  nondistended, normal bowel sounds, no rebound or guarding. No hepatosplenomegaly.  No fluid wave. No inguinal or umbilical LAD. Neurologic:  Alert and  oriented x4;  grossly nonfocal. No asterixis or clonus. Skin: No rash or bruise. No palmar erythema or spider angioma.  Psych:  Alert and cooperative. Tearful when speaking of her brother. Normal mood and affect.     L. Tarri Glenn, MD, MPH 01/03/2021, 8:42 AM

## 2021-01-04 ENCOUNTER — Telehealth: Payer: Self-pay | Admitting: Gastroenterology

## 2021-01-04 NOTE — Telephone Encounter (Signed)
Good afternoon Dr. Tarri Glenn, patient called stating they had a family emergency come up so they rescheduled their procedure from 01/08/21 to 02/01/21.

## 2021-01-05 NOTE — Telephone Encounter (Signed)
Called pt to review sooner availabilities. Pt declined offer to move up appt to a sooner date/time. Asked to keep procedure as scheduled

## 2021-01-08 ENCOUNTER — Other Ambulatory Visit: Payer: Self-pay | Admitting: Internal Medicine

## 2021-01-08 ENCOUNTER — Other Ambulatory Visit (HOSPITAL_COMMUNITY): Payer: Self-pay

## 2021-01-08 ENCOUNTER — Encounter: Payer: Medicare Other | Admitting: Gastroenterology

## 2021-01-09 ENCOUNTER — Other Ambulatory Visit (HOSPITAL_COMMUNITY): Payer: Self-pay

## 2021-01-10 ENCOUNTER — Other Ambulatory Visit: Payer: Self-pay

## 2021-01-10 ENCOUNTER — Other Ambulatory Visit (HOSPITAL_COMMUNITY): Payer: Self-pay

## 2021-01-10 NOTE — Progress Notes (Signed)
Appointment Information  Name: [name], [name] MRN: [id number]  Date: 01/18/2021 Status: Sch  Time: 9:00 AM Length: 60  Visit Type: MR [place] [[id number]] Copay: $0.00  Provider: [facility] Department: [facility]  Referring Provider: Thornton Park CSN: [id number]  Notes: S/w pt arrive @ 830am ,NPO 4hrs prior... LC  Made On: 01/09/2021 2:19 PM By: Maryann Conners

## 2021-01-11 ENCOUNTER — Other Ambulatory Visit: Payer: Self-pay | Admitting: *Deleted

## 2021-01-11 ENCOUNTER — Encounter: Payer: Self-pay | Admitting: *Deleted

## 2021-01-11 ENCOUNTER — Other Ambulatory Visit (HOSPITAL_COMMUNITY): Payer: Self-pay

## 2021-01-11 ENCOUNTER — Other Ambulatory Visit: Payer: Self-pay | Admitting: Gastroenterology

## 2021-01-11 DIAGNOSIS — K51919 Ulcerative colitis, unspecified with unspecified complications: Secondary | ICD-10-CM

## 2021-01-11 LAB — HEPATITIS C RNA QUANTITATIVE
HCV Quantitative Log: <1.18 Log IU/mL
HCV RNA, PCR, QN: <15 IU/mL

## 2021-01-11 LAB — HEPATITIS C ANTIBODY
Hepatitis C Ab: REACTIVE — AB
SIGNAL TO CUT-OFF: 3.16 — ABNORMAL HIGH (ref <1.00)

## 2021-01-11 LAB — ANTI-SMOOTH MUSCLE ANTIBODY, IGG: Actin (Smooth Muscle) Antibody (IGG): <20 U (ref <20)

## 2021-01-11 LAB — GASTRIN: Gastrin: 238 pg/mL — ABNORMAL HIGH (ref <=100)

## 2021-01-11 LAB — QUANTIFERON-TB GOLD PLUS
Mitogen-NIL: >10 IU/mL
NIL: 0.08 IU/mL
QuantiFERON-TB Gold Plus: NEGATIVE
TB1-NIL: <0 IU/mL
TB2-NIL: <0 IU/mL

## 2021-01-11 LAB — AFP TUMOR MARKER: AFP-Tumor Marker: 2.9 ng/mL

## 2021-01-11 LAB — IGG: IgG (Immunoglobin G), Serum: 1657 mg/dL — ABNORMAL HIGH (ref 600–1540)

## 2021-01-11 LAB — HEPATITIS B SURFACE ANTIBODY,QUALITATIVE: Hep B S Ab: NONREACTIVE

## 2021-01-11 LAB — HEPATITIS B CORE ANTIBODY, TOTAL: Hep B Core Total Ab: NONREACTIVE

## 2021-01-11 LAB — HEPATITIS B SURFACE ANTIGEN: Hepatitis B Surface Ag: NONREACTIVE

## 2021-01-11 NOTE — Patient Outreach (Signed)
[facility]) Care Management  [place]  01/11/2021   [name] [name] February 21, 1961 [id number]   Referral Date: 7/27 Referral Source: EMMI Referral Reason: Chronic care/disease management Insurance: [place] attempt #1, successful.  Identity verified.  This care manager introduced self and stated purpose of call.  [facility] care management services explained.    Social: Member reports living with daughter and grandchildren.  She is independent in all ADL's/IADL's.  State she is concerned about needing affordable housing as they are currently living in her brother's home.  Report there has been some very stressful situations lately and finding another home would help with that.    Conditions: Per chart, has history of UC, HLD, HIV, and depression.  Recently admitted to hospital 5/29-6/1 for UC with rectal bleeding.  Medications: Reviewed with member, state she is taking as instructed.  Denies need for financial assistance.  She is concerned about her prednisone, was advised to take 2 tablets until follow up with GI.  She has taken her last dose yesterday.  She called to pharmacy, has not received follow up.  This care manager called to pharmacy, advised that they sent refill request to provider, however it was not GI provider.  They will send to [facility] GI specialist.  Appointments: Has MRI scheduled for 8/4, Colonoscopy on 8/18, and General Surgery on 8/24.  She missed PCP appointment on 7/5, will call to reschedule this as well as her ID appointment.  Denies need for transportation assistance, aware of [employer] benefit.  Advance Directives: Does not have paperwork but request copy so she and daughter can complete.  Consent: Agrees to [facility] involvement.   Encounter Medications:  Outpatient Encounter Medications as of 01/11/2021  Medication Sig   amLODipine (NORVASC) 10 MG tablet Take 1 tablet (10 mg total) by mouth daily. Must have office visit for refills    atorvastatin (LIPITOR) 10 MG tablet TAKE 1 TABLET BY MOUTH  DAILY (Patient taking differently: Take 10 mg by mouth daily.)   bictegravir-emtricitabine-tenofovir AF (BIKTARVY) 50-200-25 MG TABS tablet TAKE 1 TABLET BY MOUTH DAILY.   budesonide (ENTOCORT EC) 3 MG 24 hr capsule Take 3 capsules (9 mg total) by mouth daily.   escitalopram (LEXAPRO) 10 MG tablet TAKE 1/2 TABLET BY MOUTH DAILY FOR 30 DAYS THEN INCREASE TO 1 TABLET DAILY AS DIRECTED (Patient taking differently: Take 10 mg by mouth daily.)   famotidine (PEPCID) 20 MG tablet TAKE 1 TABLET BY MOUTH  TWICE DAILY (Patient taking differently: Take 20 mg by mouth 2 (two) times daily.)   FEROSUL 325 (65 Fe) MG tablet TAKE 1 TABLET BY MOUTH DAILY WITH BREAKFAST. (Patient taking differently: Take 325 mg by mouth daily with breakfast.)   hyoscyamine (LEVSIN, ANASPAZ) 0.125 MG tablet Take 1 tablet (0.125 mg total) by mouth every 6 (six) hours as needed. (Patient taking differently: Take 0.125 mg by mouth every 4 (four) hours as needed for bladder spasms or cramping.)   lisinopril (ZESTRIL) 5 MG tablet TAKE 1 TABLET BY MOUTH  DAILY (Patient taking differently: Take 5 mg by mouth daily.)   Multiple Vitamins-Minerals (MULTIVITAMIN WITH MINERALS) tablet Take 1 tablet by mouth daily.   pantoprazole (PROTONIX) 40 MG tablet TAKE 1 TABLET BY MOUTH  DAILY (Patient taking differently: Take 40 mg by mouth daily.)   predniSONE (DELTASONE) 10 MG tablet Take 4 tabs daily for 1 week, then 3 tabs daily for 1 week, then 2 tabs daily until follow-up with your GI doctor.  sulfaSALAzine (AZULFIDINE) 500 MG tablet TAKE 2 TABLETS BY MOUTH  TWICE DAILY (Patient taking differently: Take 1,000 mg by mouth 2 (two) times daily.)   ursodiol (ACTIGALL) 250 MG tablet TAKE 1 TABLET BY MOUTH  TWICE DAILY (Patient taking differently: Take 250 mg by mouth 2 (two) times daily.)   No facility-administered encounter medications on file as of 01/11/2021.    Functional Status:  No  flowsheet data found.  Fall/Depression Screening: Fall Risk  01/11/2021 04/12/2019 03/22/2019  Falls in the past year? 0 0 0  Number falls in past yr: 0 - -  Injury with Fall? 0 - -  Risk Factor Category  - - -  Risk for fall due to : No Fall Risks - -  Follow up - Falls evaluation completed -   PHQ 2/9 Scores 05/19/2020 05/04/2020 06/28/2019 04/12/2019 03/22/2019 12/17/2018 09/08/2018  PHQ - 2 Score 0 1 0 0 0 0 0  PHQ- 9 Score 0 - - - - - -    Assessment:   Care Plan Care Plan : Irritable Bowel Syndrome (Adult)  Updates made by Valente David, RN since 01/11/2021 12:00 AM     Problem: Disorder Identification (Irritable Bowel Syndrome)      Goal: Disorder Identified evidenced by member's ability to describe condition and management in the next 2 months   Start Date: 01/11/2021  Expected End Date: 03/12/2021  Priority: Medium  Note:   7/28 - Reviewed medical history, laxative use, other over-the-counter products use, stressors, changes in lifestyle, travel and physical exam results.   Advised member to anticipate additional testing to rule out alternative diagnoses based on presenting (unexplained weight loss, rectal bleeding, a family history of bowel or ovarian cancer, anemia, abdominal or rectal mass).     Task: Identify IBS symptoms and Facilitate Diagnosis   Note:   Care Management Activities:    - activity and nutrition screen reviewed - privacy maintained    Notes:     Problem: Symptoms (Irritable Bowel Syndrome)      Long-Range Goal: Symptoms Managed evidenced by no hospitalizations in the next 3 months   Start Date: 01/11/2021  Expected End Date: 04/11/2021  Priority: High  Note:   7/28 - Partnered with patient to begin a symptom diary to assist with initial assessment of nutrition adequacy and possible food intolerance; include timing and severity of symptoms (including pain).   Assessed eating pattern and food choices; promoted an individualized healthy eating  pattern, keeping cultural and personal food preferences in mind.     Task: Alleviate Barriers to IBS Treatment   Note:   Care Management Activities:    - medication-adherence assessment performed - response to pharmacologic therapy monitored - self-awareness of symptom triggers encouraged    Notes:       Goals Addressed             This Visit's Progress    [facility] - Make and Keep All Appointments       Timeframe:  Short-Term Goal Priority:  High Start Date:        7/28                     Expected End Date:      9/28  Barriers: Health Behaviors                  Follow Up Date 01/25/2021    - ask family or friend for a ride - call to cancel if needed -  keep a calendar with appointment dates    Why is this important?   Part of staying healthy is seeing the doctor for follow-up care.  If you forget your appointments, there are some things you can do to stay on track.    Notes:   7/28 - Has MRI scheduled for 8/4, Colonoscopy on 8/18, and General Surgery on 8/24.  She missed PCP appointment on 7/5, will call to reschedule this as well as her ID appointment.     [facility] - Track My Symptoms-Irritable Bowel Syndrome       Timeframe:  Long-Range Goal Priority:  Medium Start Date:         7/28                    Expected End Date:      11/28                 Barriers: Health Behaviors   Follow Up Date 01/25/2021    - begin a symptom diary - track what makes symptoms worse and what makes them better    Why is this important?   Keeping track of symptoms that you have helps you to understand your condition. You will also learn what works to manage it.  You and your doctor will then be able to come up with the best treatment plan for you.    Notes:   7/28 - Discussed signs/symptoms of exacerbation, encouraged to take meds as instructed. Call placed to pharmacy to help with Steroid refill.  UC education sent to member.        Plan:  Follow-up: Patient agrees to Care  Plan and Follow-up. Follow-up in 2 week(s).  Will notify PCP of involvement.  Will call pharmacy regarding prednisone refill.  Will place referral to Care Guide for affordable housing options.  Will send Advance Directive paperwork.  Valente David, [place], MSN [place] [phone]

## 2021-01-12 ENCOUNTER — Other Ambulatory Visit (HOSPITAL_COMMUNITY): Payer: Self-pay

## 2021-01-12 MED ORDER — PREDNISONE 10 MG PO TABS
ORAL_TABLET | ORAL | 1 refills | Status: DC
  Filled 2021-01-12: qty 60, 20d supply, fill #0
  Filled 2021-02-20: qty 60, 20d supply, fill #1

## 2021-01-15 ENCOUNTER — Other Ambulatory Visit (HOSPITAL_COMMUNITY): Payer: Self-pay

## 2021-01-16 ENCOUNTER — Telehealth: Payer: Self-pay

## 2021-01-16 NOTE — Telephone Encounter (Signed)
   Telephone encounter was:  Successful.  01/16/2021 Name: [name] [name] MRN: [id number] DOB: 11-Feb-1961  [name] [name] is a 60 y.o. year old female who is a primary care patient of Ladell Pier, MD . The community resource team was consulted for assistance with  low income housing list.  Care guide performed the following interventions: Spoke with patient confirmed need for low-income housing list. Verified email [email or web address] sent information. Letter saved in Epic.  Follow Up Plan:  Care guide will follow up with patient by phone over the next 7-days  Dyshawn Cangelosi, AAS Paralegal, [place] [facility]  [address], [place] [address] ??[email or web address]  ?? [phone]   [email or web address]

## 2021-01-18 ENCOUNTER — Other Ambulatory Visit: Payer: Self-pay | Admitting: Gastroenterology

## 2021-01-18 ENCOUNTER — Ambulatory Visit (HOSPITAL_COMMUNITY): Admission: RE | Admit: 2021-01-18 | Payer: Medicare Other | Source: Ambulatory Visit

## 2021-01-18 DIAGNOSIS — K259 Gastric ulcer, unspecified as acute or chronic, without hemorrhage or perforation: Secondary | ICD-10-CM

## 2021-01-18 DIAGNOSIS — K31A29 Gastric intestinal metaplasia with dysplasia, unspecified: Secondary | ICD-10-CM

## 2021-01-18 DIAGNOSIS — K743 Primary biliary cirrhosis: Secondary | ICD-10-CM

## 2021-01-18 DIAGNOSIS — K754 Autoimmune hepatitis: Secondary | ICD-10-CM

## 2021-01-19 ENCOUNTER — Telehealth: Payer: Self-pay

## 2021-01-19 NOTE — Telephone Encounter (Signed)
   Telephone encounter was:  Unsuccessful.  01/19/2021 Name: [name] [name] MRN: [id number] DOB: 1961-06-11  Unsuccessful outbound call made today to assist with:   Left message on patient's voicemail to return my call regarding low-income housing list emailed on 01/15/21.   Outreach Attempt:  2nd Attempt  A HIPAA compliant voice message was left requesting a return call.  Instructed patient to call back at [phone].  [name], AAS Paralegal, [place] [facility]  [address], [place] [address] ??[name]  ?? [phone]   [email or web address]

## 2021-01-23 ENCOUNTER — Other Ambulatory Visit: Payer: Self-pay | Admitting: Gastroenterology

## 2021-01-23 ENCOUNTER — Other Ambulatory Visit (HOSPITAL_COMMUNITY): Payer: Self-pay

## 2021-01-23 DIAGNOSIS — K5190 Ulcerative colitis, unspecified, without complications: Secondary | ICD-10-CM

## 2021-01-23 MED ORDER — SULFASALAZINE 500 MG PO TABS
1000.0000 mg | ORAL_TABLET | Freq: Two times a day (BID) | ORAL | 3 refills | Status: DC
  Filled 2021-01-23: qty 360, 90d supply, fill #0

## 2021-01-24 ENCOUNTER — Other Ambulatory Visit (HOSPITAL_COMMUNITY): Payer: Self-pay

## 2021-01-24 ENCOUNTER — Telehealth: Payer: Self-pay

## 2021-01-24 NOTE — Telephone Encounter (Signed)
   Telephone encounter was:  Unsuccessful.  01/24/2021 Name: [name] [name] MRN: [id number] DOB: [birth year]  Unsuccessful outbound call made today to assist with:  Unable to leave message voicemail is full.   Outreach Attempt:  3rd Attempt.  Referral closed unable to contact patient.   Lisa-Marie Rueger, AAS Paralegal, [place] [facility]  [address], [place] [address] ??[email or web address]  ?? [phone]   [email or web address]

## 2021-01-25 ENCOUNTER — Other Ambulatory Visit: Payer: Self-pay | Admitting: *Deleted

## 2021-01-25 ENCOUNTER — Other Ambulatory Visit (HOSPITAL_COMMUNITY): Payer: Self-pay

## 2021-01-25 NOTE — Patient Outreach (Signed)
[facility]) Care Management  01/25/2021  [name] [name] [birth year] [id number]   Outgoing all placed to member, state she is doing well. Denies any urgent concerns, encouraged to contact this care manager with questions.  Agrees to follow up within the next month.   Goals Addressed             This Visit's Progress    [facility] - Make and Keep All Appointments   Not on track    Timeframe:  Short-Term Goal Priority:  High Start Date:        7/28                     Expected End Date:      9/28  Barriers: Health Behaviors                  - ask family or friend for a ride - call to cancel if needed - keep a calendar with appointment dates    Why is this important?   Part of staying healthy is seeing the doctor for follow-up care.  If you forget your appointments, there are some things you can do to stay on track.    Notes:   7/28 - Has MRI scheduled for 8/4, Colonoscopy on 8/18, and General Surgery on 8/24.  She missed PCP appointment on 7/5, will call to reschedule this as well as her ID appointment.  8/11 - Still has not scheduled appointments with PCP or ID, state she will call today.  Educated on importance of follow up and management of chronic conditions     [facility] - Track My Symptoms-Irritable Bowel Syndrome   On track    Timeframe:  Long-Range Goal Priority:  Medium Start Date:         7/28                    Expected End Date:      11/28                 Barriers: Health Behaviors   - begin a symptom diary - track what makes symptoms worse and what makes them better    Why is this important?   Keeping track of symptoms that you have helps you to understand your condition. You will also learn what works to manage it.  You and your doctor will then be able to come up with the best treatment plan for you.    Notes:   7/28 - Discussed signs/symptoms of exacerbation, encouraged to take meds as instructed. Call placed to pharmacy to help with  Steroid refill.  UC education sent to member.  8/11 - Reminded of colonoscopy scheduled for next week.  Denies any questions regarding procedure       Valente David, RN, MSN [place] [phone]

## 2021-01-26 ENCOUNTER — Other Ambulatory Visit (HOSPITAL_COMMUNITY): Payer: Self-pay

## 2021-01-26 MED FILL — Bictegravir-Emtricitabine-Tenofovir AF Tab 50-200-25 MG: ORAL | 30 days supply | Qty: 30 | Fill #4 | Status: AC

## 2021-01-30 ENCOUNTER — Other Ambulatory Visit (HOSPITAL_COMMUNITY): Payer: Self-pay

## 2021-02-01 ENCOUNTER — Encounter: Payer: Medicare Other | Admitting: Gastroenterology

## 2021-02-01 ENCOUNTER — Ambulatory Visit (HOSPITAL_COMMUNITY): Admission: RE | Admit: 2021-02-01 | Payer: Medicare Other | Source: Ambulatory Visit

## 2021-02-07 ENCOUNTER — Other Ambulatory Visit: Payer: Self-pay | Admitting: Internal Medicine

## 2021-02-07 DIAGNOSIS — E785 Hyperlipidemia, unspecified: Secondary | ICD-10-CM

## 2021-02-07 NOTE — Telephone Encounter (Signed)
Requested medication (s) are due for refill today: no  Requested medication (s) are on the active medication list: yes   Last refill:  12/07/2020  Future visit scheduled: no  Notes to clinic:  Patient was a no show to appt on 12/19/2020   Requested Prescriptions  Pending Prescriptions Disp Refills   atorvastatin (LIPITOR) 10 MG tablet [Pharmacy Med Name: Atorvastatin Calcium 10 MG Oral Tablet] 90 tablet 3    Sig: TAKE 1 TABLET BY MOUTH  DAILY     Cardiovascular:  Antilipid - Statins Failed - 02/07/2021  9:38 AM      Failed - Total Cholesterol in normal range and within 360 days    Cholesterol  Date Value Ref Range Status  04/20/2020 223 (H) <200 mg/dL Final          Failed - LDL in normal range and within 360 days    LDL Cholesterol (Calc)  Date Value Ref Range Status  04/20/2020 137 (H) mg/dL (calc) Final    Comment:    Reference range: <100 . Desirable range <100 mg/dL for primary prevention;   <70 mg/dL for patients with CHD or diabetic patients  with > or = 2 CHD risk factors. Marland Kitchen LDL-C is now calculated using the Martin-Hopkins  calculation, which is a validated novel method providing  better accuracy than the Friedewald equation in the  estimation of LDL-C.  Cresenciano Genre et al. Annamaria Helling. 0045;997(74): 2061-2068  (http://education.[facility].com/faq/FAQ164)           Passed - HDL in normal range and within 360 days    HDL  Date Value Ref Range Status  04/20/2020 66 > OR = 50 mg/dL Final          Passed - Triglycerides in normal range and within 360 days    Triglycerides  Date Value Ref Range Status  04/20/2020 94 <150 mg/dL Final          Passed - Patient is not pregnant      Passed - Valid encounter within last 12 months    Recent Outpatient Visits           1 month ago No-show for appointment   [place] Ladell Pier, MD   8 months ago Essential hypertension   [place],  Deborah B, MD   1 year ago Essential hypertension   [place], Deborah B, MD   1 year ago Essential hypertension   [place], MD   2 years ago Pap smear for cervical cancer screening   [place], Deborah B, MD       Future Appointments             In 6 days Tommy Medal, Lavell Islam, MD [facility]   In 1 month Wynetta Emery Dalbert Batman, MD [place]

## 2021-02-11 ENCOUNTER — Telehealth: Payer: Self-pay

## 2021-02-11 NOTE — Telephone Encounter (Signed)
Called pt to schedule Annual Wellness Visit, No answer left message to call [place] to schedule at [phone]

## 2021-02-13 ENCOUNTER — Ambulatory Visit: Payer: Medicare Other | Admitting: Infectious Disease

## 2021-02-20 ENCOUNTER — Other Ambulatory Visit: Payer: Self-pay | Admitting: Internal Medicine

## 2021-02-20 ENCOUNTER — Other Ambulatory Visit (HOSPITAL_COMMUNITY): Payer: Self-pay

## 2021-02-20 DIAGNOSIS — I10 Essential (primary) hypertension: Secondary | ICD-10-CM

## 2021-02-21 ENCOUNTER — Other Ambulatory Visit (HOSPITAL_COMMUNITY): Payer: Self-pay

## 2021-02-21 NOTE — Telephone Encounter (Signed)
Requested medications are due for refill today.  yes  Requested medications are on the active medications list.  yes  Last refill. 09/27/2020  Future visit scheduled.   yes  Notes to clinic.  Courtesy refill given. Pt is more than 3 months overdue for OV.

## 2021-02-22 ENCOUNTER — Other Ambulatory Visit (HOSPITAL_COMMUNITY): Payer: Self-pay

## 2021-02-22 MED ORDER — AMLODIPINE BESYLATE 10 MG PO TABS
10.0000 mg | ORAL_TABLET | Freq: Every day | ORAL | 0 refills | Status: DC
  Filled 2021-02-22: qty 30, 30d supply, fill #0

## 2021-02-22 MED FILL — Bictegravir-Emtricitabine-Tenofovir AF Tab 50-200-25 MG: ORAL | 30 days supply | Qty: 30 | Fill #5 | Status: AC

## 2021-02-26 ENCOUNTER — Other Ambulatory Visit (HOSPITAL_COMMUNITY): Payer: Self-pay

## 2021-02-27 ENCOUNTER — Telehealth: Payer: Self-pay

## 2021-02-27 NOTE — Telephone Encounter (Signed)
Called pt to schedule AWV, scheduled for 9/15 at 3pm

## 2021-03-01 ENCOUNTER — Other Ambulatory Visit: Payer: Self-pay | Admitting: *Deleted

## 2021-03-01 ENCOUNTER — Encounter (HOSPITAL_BASED_OUTPATIENT_CLINIC_OR_DEPARTMENT_OTHER): Payer: Medicare Other

## 2021-03-01 ENCOUNTER — Telehealth: Payer: Self-pay

## 2021-03-01 DIAGNOSIS — Z0000 Encounter for general adult medical examination without abnormal findings: Secondary | ICD-10-CM

## 2021-03-01 DIAGNOSIS — F1911 Other psychoactive substance abuse, in remission: Secondary | ICD-10-CM | POA: Insufficient documentation

## 2021-03-01 DIAGNOSIS — B1920 Unspecified viral hepatitis C without hepatic coma: Secondary | ICD-10-CM | POA: Insufficient documentation

## 2021-03-01 NOTE — Progress Notes (Signed)
Unable to complete note in timely manner.

## 2021-03-01 NOTE — Patient Outreach (Signed)
[facility]) Care Management  03/01/2021  Cydnee Fuquay Gerhart 1960/07/26 [id number]   Outgoing call placed to member, no answer, HIPAA compliant voice message left.  Will follow up within the next 3-4 business days.  Valente David, [place], MSN [place] [phone]

## 2021-03-01 NOTE — Progress Notes (Deleted)
Subjective:   [name] [name] is a 60 y.o. female who presents for an Initial Medicare Annual Wellness Visit.I connected with  Otilio Carpen Denz on 03/01/21 by a Audio enabled telemedicine application and verified that I am speaking with the correct person using two identifiers.   I discussed the limitations of evaluation and management by telemedicine. The patient expressed understanding and agreed to proceed.  Location of patient:Home Location of provider: Office  Person participating in visit: [name] [name](patient) Quintella Baton [facility]  Review of Systems    Defer to PCP       Objective:    There were no vitals filed for this visit. There is no height or weight on file to calculate BMI.  Advanced Directives 03/01/2021 01/11/2021 06/15/2020 04/27/2020 03/15/2019 04/07/2017 12/17/2016  Does Patient Have a Medical Advance Directive? No No No No No No No  Would patient like information on creating a medical advance directive? No - Patient declined Yes (MAU/Ambulatory/Procedural Areas - Information given) Yes (MAU/Ambulatory/Procedural Areas - Information given) Yes (MAU/Ambulatory/Procedural Areas - Information given) No - Patient declined No - Patient declined -    Current Medications (verified) Outpatient Encounter Medications as of 03/01/2021  Medication Sig   amLODipine (NORVASC) 10 MG tablet Take 1 tablet by mouth daily. ***Must have office visit for refills   atorvastatin (LIPITOR) 10 MG tablet TAKE 1 TABLET BY MOUTH  DAILY   bictegravir-emtricitabine-tenofovir AF (BIKTARVY) 50-200-25 MG TABS tablet TAKE 1 TABLET BY MOUTH DAILY.   budesonide (ENTOCORT EC) 3 MG 24 hr capsule Take 3 capsules (9 mg total) by mouth daily.   escitalopram (LEXAPRO) 10 MG tablet TAKE 1/2 TABLET BY MOUTH DAILY FOR 30 DAYS THEN INCREASE TO 1 TABLET DAILY AS DIRECTED (Patient taking differently: Take 10 mg by mouth daily.)   famotidine (PEPCID) 20 MG tablet TAKE 1 TABLET BY MOUTH  TWICE DAILY (Patient  taking differently: Take 20 mg by mouth 2 (two) times daily.)   FEROSUL 325 (65 Fe) MG tablet TAKE 1 TABLET BY MOUTH DAILY WITH BREAKFAST. (Patient taking differently: Take 325 mg by mouth daily with breakfast.)   hyoscyamine (LEVSIN, ANASPAZ) 0.125 MG tablet Take 1 tablet (0.125 mg total) by mouth every 6 (six) hours as needed. (Patient taking differently: Take 0.125 mg by mouth every 4 (four) hours as needed for bladder spasms or cramping.)   lisinopril (ZESTRIL) 5 MG tablet TAKE 1 TABLET BY MOUTH  DAILY (Patient taking differently: Take 5 mg by mouth daily.)   Multiple Vitamins-Minerals (MULTIVITAMIN WITH MINERALS) tablet Take 1 tablet by mouth daily.   pantoprazole (PROTONIX) 40 MG tablet TAKE 1 TABLET BY MOUTH  DAILY (Patient taking differently: Take 40 mg by mouth daily.)   predniSONE (DELTASONE) 10 MG tablet Take 4 tablets daily for 1 week, then 3 tablets daily for 1 week, then 2 tablets daily until follow-up with your GI doctor.   sulfaSALAzine (AZULFIDINE) 500 MG tablet TAKE 2 TABLETS BY MOUTH  TWICE DAILY   ursodiol (ACTIGALL) 250 MG tablet TAKE 1 TABLET BY MOUTH  TWICE DAILY (Patient taking differently: Take 250 mg by mouth 2 (two) times daily.)   No facility-administered encounter medications on file as of 03/01/2021.    Allergies (verified) Patient has no known allergies.   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   Substance abuse ([place])    Quit 2008   Ulcerative colitis [facility])    Past Surgical History:  Procedure Laterality Date   ABDOMINAL HYST[facility]CTOMY  [facility]    [facility] patient unsure of indication   BIOPSY  04/27/2020   Procedure: BIOPSY;  Surgeon: Thornton Park, MD;   Location: [facility];  Service: Gastroenterology;;   BIOPSY  06/15/2020   Procedure: BIOPSY;  Surgeon: Milus Banister, MD;  Location: [facility];  Service: Gastroenterology;;   COLONOSCOPY     ESOPHAGOGASTRODUODENOSCOPY     ESOPHAGOGASTRODUODENOSCOPY (EGD) WITH PROPOFOL N/A 04/27/2020   Procedure: ESOPHAGOGASTRODUODENOSCOPY (EGD) WITH PROPOFOL;  Surgeon: Thornton Park, MD;  Location: [facility];  Service: Gastroenterology;  Laterality: N/A;   ESOPHAGOGASTRODUODENOSCOPY (EGD) WITH PROPOFOL N/A 06/15/2020   Procedure: ESOPHAGOGASTRODUODENOSCOPY (EGD) WITH PROPOFOL;  Surgeon: Milus Banister, MD;  Location: [facility];  Service: Gastroenterology;  Laterality: N/A;   TEE WITHOUT CARDIOVERSION N/A 05/02/2014   Procedure: TRANSESOPHAGEAL ECHOCARDIOGRAM (TEE);  Surgeon: Larey Dresser, MD;  Location: [place];  Service: Cardiovascular;  Laterality: N/A;   UPPER ESOPHAGEAL ENDOSCOPIC ULTRASOUND (EUS) N/A 06/15/2020   Procedure: UPPER ESOPHAGEAL ENDOSCOPIC ULTRASOUND (EUS);  Surgeon: Milus Banister, MD;  Location: [facility];  Service: Gastroenterology;  Laterality: N/A;   VIDEO BRONCHOSCOPY Bilateral 04/27/2014   Procedure: VIDEO BRONCHOSCOPY WITH FLUORO;  Surgeon: Rigoberto Noel, MD;  Location: [facility];  Service: Cardiopulmonary;  Laterality: Bilateral;   Family History  Problem Relation Age of Onset   Diabetes Mother    Stroke Mother    Cancer Father        ?   Alcoholism Father    Pancreatitis Sister    Diabetes Sister    Prostate cancer Brother    Colon cancer Brother    Colon cancer Paternal Uncle 52   Bipolar disorder Daughter    Esophageal cancer Neg Hx    Rectal cancer Neg Hx    Stomach cancer Neg Hx    Social History   Socioeconomic History   Marital status: Single    Spouse name: Not on file   Number of children: Not on file   Years of education: Not on file   Highest education level: Not on file  Occupational History   Not on file  Tobacco  Use   Smoking status: Former    Packs/day: 0.20    Years: 35.00    Pack years: 7.00    Types: Cigarettes   Smokeless tobacco: Never  Vaping Use   Vaping Use: Never used  Substance and Sexual Activity   Alcohol use: No    Alcohol/week: 0.0 standard drinks   Drug use: Yes    Types: Marijuana    Comment: previous cocaine use, marijuana on 12/29   Sexual activity: Not Currently    Birth control/protection: Other-see comments, Surgical    Comment: offered condoms; 03/2019  Other Topics Concern   Not on file  Social History Narrative   Not on file   Social Determinants of Health   Financial Resource Strain: Low Risk    Difficulty of Paying Living Expenses: Not very hard  Food Insecurity: No Food Insecurity   Worried About Charity fundraiser in the Last Year: Never true   Ran Out of Food in the Last Year: Never true  Transportation Needs: No Transportation Needs   Lack of  Transportation (Medical): No   Lack of Transportation (Non-Medical): No  Physical Activity: Sufficiently Active   Days of Exercise per Week: 7 days   Minutes of Exercise per Session: 30 min  Stress: No Stress Concern Present   Feeling of Stress : Not at all  Social Connections: Moderately Integrated   Frequency of Communication with Friends and Family: More than three times a week   Frequency of Social Gatherings with Friends and Family: More than three times a week   Attends Religious Services: More than 4 times per year   Active Member of [employer] or Organizations: Yes   Attends Archivist Meetings: More than 4 times per year   Marital Status: Widowed    Tobacco Counseling Counseling given: Not Answered   Clinical Intake:  Pre-visit preparation completed: Yes  Pain : No/denies pain     Nutritional Risks: Unintentional weight loss (ulcer that will be removed) Diabetes: No  How often do you need to have someone help you when you read instructions, pamphlets, or other written materials  from your doctor or pharmacy?: 1 - Never  Diabetic?NO  Interpreter Needed?: No      Activities of Daily Living In your present state of health, do you have any difficulty performing the following activities: 03/01/2021  Hearing? N  Vision? Y  Difficulty concentrating or making decisions? N  Walking or climbing stairs? N  Dressing or bathing? N  Doing errands, shopping? N  Preparing Food and eating ? N  Using the Toilet? N  In the past six months, have you accidently leaked urine? N  Do you have problems with loss of bowel control? N  Managing your Medications? N  Managing your Finances? N  Housekeeping or managing your Housekeeping? N  Some recent data might be hidden    Patient Care Team: Ladell Pier, MD as PCP - General (Internal Medicine) Valente David, RN as [place] any recent Medical Services you may have received from other than [facility] providers in the past year (date may be approximate).     Assessment:   This is a routine wellness examination for [name] [name].  Hearing/Vision screen No results found.  Dietary issues and exercise activities discussed:     Goals Addressed   None    Depression Screen PHQ 2/9 Scores 03/01/2021 01/11/2021 05/19/2020 05/04/2020 06/28/2019 04/12/2019 03/22/2019  PHQ - 2 Score 0 0 0 1 0 0 0  PHQ- 9 Score - - 0 - - - -    Fall Risk Fall Risk  03/01/2021 01/11/2021 04/12/2019 03/22/2019 12/17/2018  Falls in the past year? 0 0 0 0 0  Number falls in past yr: 0 0 - - -  Injury with Fall? 0 0 - - -  Risk Factor Category  - - - - -  Risk for fall due to : No Fall Risks No Fall Risks - - -  Follow up Falls evaluation completed - Falls evaluation completed - -    FALL RISK PREVENTION PERTAINING TO THE HOME:  Any stairs in or around the home? No  If so, are there any without handrails? No  Home free of loose throw rugs in walkways, pet beds, electrical cords, etc? No  Adequate lighting in your  home to reduce risk of falls? Yes   ASSISTIVE DEVICES UTILIZED TO PREVENT FALLS:  Life alert? Yes  Use of a cane, walker or w/c? No  Grab bars in the bathroom? Yes  Shower  chair or bench in shower? No  Elevated toilet seat or a handicapped toilet? No   TIMED UP AND GO:  Was the test performed? No .     Cognitive Function:     6CIT Screen 03/01/2021  What Year? 0 points  What month? 0 points  What time? 0 points  Count back from 20 0 points  Months in reverse 0 points  Repeat phrase 0 points  Total Score 0    Immunizations Immunization History  Administered Date(s) Administered   Hepatitis B 04/15/2008   Influenza, Seasonal, Injecte, Preservative Fre 03/01/2014   Influenza,inj,Quad PF,6+ Mos 03/13/2015, 04/12/2016, 04/07/2017, 04/23/2018, 03/25/2019, 05/04/2020   PFIZER(Purple Top)SARS-COV-2 Vaccination 10/04/2019, 10/25/2019   Pneumococcal Conjugate-13 07/03/2018   Pneumococcal Polysaccharide-23 03/01/2014   Tdap 09/22/2015    TDAP status: Up to date  Flu Vaccine status: Due, Education has been provided regarding the importance of this vaccine. Advised may receive this vaccine at local pharmacy or Health Dept. Aware to provide a copy of the vaccination record if obtained from local pharmacy or Health Dept. Verbalized acceptance and understanding.  Pneumococcal vaccine status: Due, Education has been provided regarding the importance of this vaccine. Advised may receive this vaccine at local pharmacy or Health Dept. Aware to provide a copy of the vaccination record if obtained from local pharmacy or Health Dept. Verbalized acceptance and understanding.  Covid-19 vaccine status: Completed vaccines  Qualifies for Shingles Vaccine? Yes   Zostavax completed No   Shingrix Completed?: No.    Education has been provided regarding the importance of this vaccine. Patient has been advised to call insurance company to determine out of pocket expense if they have not yet received  this vaccine. Advised may also receive vaccine at local pharmacy or Health Dept. Verbalized acceptance and understanding.  Screening Tests Health Maintenance  Topic Date Due   Zoster Vaccines- Shingrix (1 of 2) Never done   Pneumococcal Vaccine 19-28 Years old (3 - PPSV23 or PCV20) 07/04/2019   COVID-19 Vaccine (3 - Pfizer risk series) 11/22/2019   PAP SMEAR-Modifier  12/17/2019   INFLUENZA VACCINE  03/12/2021 (Originally 01/15/2021)   MAMMOGRAM  03/24/2021   TETANUS/TDAP  09/21/2025   COLONOSCOPY (Pts 45-28yr Insurance coverage will need to be confirmed)  03/30/2029   Hepatitis C Screening  Completed   HIV Screening  Completed   HPV VACCINES  Aged Out    Health Maintenance  Health Maintenance Due  Topic Date Due   Zoster Vaccines- Shingrix (1 of 2) Never done   Pneumococcal Vaccine 045632Years old (3 - PPSV23 or PCV20) 07/04/2019   COVID-19 Vaccine (3 - Pfizer risk series) 11/22/2019   PAP SMEAR-Modifier  12/17/2019    Colorectal cancer screening: Type of screening: Colonoscopy. Completed 03/31/2019. Repeat every 10 years  Mammogram status: Completed 03/25/2019. Repeat every year  Bone Density status: Ordered n/a. Pt provided with contact info and advised to call to schedule appt.  Lung Cancer Screening: (Low Dose CT Chest recommended if Age 62-80years, 30 pack-year currently smoking OR have quit w/in 15years.) does not qualify.   Lung Cancer Screening Referral: n/a  Additional Screening:  Hepatitis C Screening: does qualify; Completed 01/03/21  Vision Screening: Recommended annual ophthalmology exams for early detection of glaucoma and other disorders of the eye. Is the patient up to date with their annual eye exam?  No  Who is the provider or what is the name of the office in which the patient attends annual eye exams? No If pt  is not established with a provider, would they like to be referred to a provider to establish care? Yes .   Dental Screening: Recommended annual  dental exams for proper oral hygiene  Community Resource Referral / Chronic Care Management: CRR required this visit?  No   CCM required this visit?  No      Plan:     I have personally reviewed and noted the following in the patient's chart:   Medical and social history Use of alcohol, tobacco or illicit drugs  Current medications and supplements including opioid prescriptions. Patient is not currently taking opioid prescriptions. Functional ability and status Nutritional status Physical activity Advanced directives List of other physicians Hospitalizations, surgeries, and ER visits in previous 12 months Vitals Screenings to include cognitive, depression, and falls Referrals and appointments  In addition, I have reviewed and discussed with patient certain preventive protocols, quality metrics, and best practice recommendations. A written personalized care plan for preventive services as well as general preventive health recommendations were provided to patient.     Beatriz Stallion   03/01/2021   Nurse Notes:60 minute Non Face to Face visit. [name] [name] , Thank you for taking time to come for your Medicare Wellness Visit. I appreciate your ongoing commitment to your health goals. Please review the following plan we discussed and let me know if I can assist you in the future.   These are the goals we discussed:  Goals      Blood Pressure < 140/90     Quit smoking / using tobacco     [facility] - Make and Keep All Appointments     Timeframe:  Short-Term Goal Priority:  High Start Date:        7/28                     Expected End Date:      9/28  Barriers: Health Behaviors                  - ask family or friend for a ride - call to cancel if needed - keep a calendar with appointment dates    Why is this important?   Part of staying healthy is seeing the doctor for follow-up care.  If you forget your appointments, there are some things you can do to stay on track.     Notes:   7/28 - Has MRI scheduled for 8/4, Colonoscopy on 8/18, and General Surgery on 8/24.  She missed PCP appointment on 7/5, will call to reschedule this as well as her ID appointment.  8/11 - Still has not scheduled appointments with PCP or ID, state she will call today.  Educated on importance of follow up and management of chronic conditions     [facility] - Track My Symptoms-Irritable Bowel Syndrome     Timeframe:  Long-Range Goal Priority:  Medium Start Date:         7/28                    Expected End Date:      11/28                 Barriers: Health Behaviors   - begin a symptom diary - track what makes symptoms worse and what makes them better    Why is this important?   Keeping track of symptoms that you have helps you to understand your condition. You will also learn  what works to manage it.  You and your doctor will then be able to come up with the best treatment plan for you.    Notes:   7/28 - Discussed signs/symptoms of exacerbation, encouraged to take meds as instructed. Call placed to pharmacy to help with Steroid refill.  UC education sent to member.  8/11 - Reminded of colonoscopy scheduled for next week.  Denies any questions regarding procedure        This is a list of the screening recommended for you and due dates:  Health Maintenance  Topic Date Due   Zoster (Shingles) Vaccine (1 of 2) Never done   Pneumococcal Vaccination (3 - PPSV23 or PCV20) 07/04/2019   COVID-19 Vaccine (3 - Pfizer risk series) 11/22/2019   Pap Smear  12/17/2019   Flu Shot  03/12/2021*   Mammogram  03/24/2021   Tetanus Vaccine  09/21/2025   Colon Cancer Screening  03/30/2029   Hepatitis C Screening: USPSTF Recommendation to screen - Ages 18-79 yo.  Completed   HIV Screening  Completed   HPV Vaccine  Aged Out  *Topic was postponed. The date shown is not the original due date.

## 2021-03-01 NOTE — Telephone Encounter (Signed)
Called pt twice to complete annual wellness visit. No answer, left message for her to call back. [phone]

## 2021-03-01 NOTE — Patient Instructions (Addendum)
Health Maintenance, Female Adopting a healthy lifestyle and getting preventive care are important in promoting health and wellness. Ask your health care provider about: The right schedule for you to have regular tests and exams. Things you can do on your own to prevent diseases and keep yourself healthy. What should I know about diet, weight, and exercise? Eat a healthy diet  Eat a diet that includes plenty of vegetables, fruits, low-fat dairy products, and lean protein. Do not eat a lot of foods that are high in solid fats, added sugars, or sodium. Maintain a healthy weight Body mass index (BMI) is used to identify weight problems. It estimates body fat based on height and weight. Your health care provider can help determine your BMI and help you achieve or maintain a healthy weight. Get regular exercise Get regular exercise. This is one of the most important things you can do for your health. Most adults should: Exercise for at least 150 minutes each week. The exercise should increase your heart rate and make you sweat (moderate-intensity exercise). Do strengthening exercises at least twice a week. This is in addition to the moderate-intensity exercise. Spend less time sitting. Even light physical activity can be beneficial. Watch cholesterol and blood lipids Have your blood tested for lipids and cholesterol at 62 years of age, then have this test every 5 years. Have your cholesterol levels checked more often if: Your lipid or cholesterol levels are high. You are older than 62 years of age. You are at high risk for heart disease. What should I know about cancer screening? Depending on your health history and family history, you may need to have cancer screening at various ages. This may include screening for: Breast cancer. Cervical cancer. Colorectal cancer. Skin cancer. Lung cancer. What should I know about heart disease, diabetes, and high blood pressure? Blood pressure and heart  disease High blood pressure causes heart disease and increases the risk of stroke. This is more likely to develop in people who have high blood pressure readings, are of African descent, or are overweight. Have your blood pressure checked: Every 3-5 years if you are 56-60 years of age. Every year if you are 73 years old or older. Diabetes Have regular diabetes screenings. This checks your fasting blood sugar level. Have the screening done: Once every three years after age 54 if you are at a normal weight and have a low risk for diabetes. More often and at a younger age if you are overweight or have a high risk for diabetes. What should I know about preventing infection? Hepatitis B If you have a higher risk for hepatitis B, you should be screened for this virus. Talk with your health care provider to find out if you are at risk for hepatitis B infection. Hepatitis C Testing is recommended for: Everyone born from [birth year] through 1965. Anyone with known risk factors for hepatitis C. Sexually transmitted infections (STIs) Get screened for STIs, including gonorrhea and chlamydia, if: You are sexually active and are younger than 62 years of age. You are older than 62 years of age and your health care provider tells you that you are at risk for this type of infection. Your sexual activity has changed since you were last screened, and you are at increased risk for chlamydia or gonorrhea. Ask your health care provider if you are at risk. Ask your health care provider about whether you are at high risk for HIV. Your health care provider may recommend a prescription medicine  to help prevent HIV infection. If you choose to take medicine to prevent HIV, you should first get tested for HIV. You should then be tested every 3 months for as long as you are taking the medicine. Pregnancy If you are about to stop having your period (premenopausal) and you may become pregnant, seek counseling before you get  pregnant. Take 400 to 800 micrograms (mcg) of folic acid every day if you become pregnant. Ask for birth control (contraception) if you want to prevent pregnancy. Osteoporosis and menopause Osteoporosis is a disease in which the bones lose minerals and strength with aging. This can result in bone fractures. If you are 44 years old or older, or if you are at risk for osteoporosis and fractures, ask your health care provider if you should: Be screened for bone loss. Take a calcium or vitamin D supplement to lower your risk of fractures. Be given hormone replacement therapy (HRT) to treat symptoms of menopause. Follow these instructions at home: Lifestyle Do not use any products that contain nicotine or tobacco, such as cigarettes, e-cigarettes, and chewing tobacco. If you need help quitting, ask your health care provider. Do not use street drugs. Do not share needles. Ask your health care provider for help if you need support or information about quitting drugs. Alcohol use Do not drink alcohol if: Your health care provider tells you not to drink. You are pregnant, may be pregnant, or are planning to become pregnant. If you drink alcohol: Limit how much you use to 0-1 drink a day. Limit intake if you are breastfeeding. Be aware of how much alcohol is in your drink. In the [place] one drink equals one 12 oz bottle of beer (355 mL), one 5 oz glass of wine (148 mL), or one 1 oz glass of hard liquor (44 mL). General instructions Schedule regular health, dental, and eye exams. Stay current with your vaccines. Tell your health care provider if: You often feel depressed. You have ever been abused or do not feel safe at home. Summary Adopting a healthy lifestyle and getting preventive care are important in promoting health and wellness. Follow your health care provider's instructions about healthy diet, exercising, and getting tested or screened for diseases. Follow your health care provider's  instructions on monitoring your cholesterol and blood pressure. This information is not intended to replace advice given to you by your health care provider. Make sure you discuss any questions you have with your health care provider. Document Revised: 08/11/2020 Document Reviewed: 05/27/2018 Elsevier Patient Education  2022 [place] Maintenance, Female Adopting a healthy lifestyle and getting preventive care are important in promoting health and wellness. Ask your health care provider about: The right schedule for you to have regular tests and exams. Things you can do on your own to prevent diseases and keep yourself healthy. What should I know about diet, weight, and exercise? Eat a healthy diet  Eat a diet that includes plenty of vegetables, fruits, low-fat dairy products, and lean protein. Do not eat a lot of foods that are high in solid fats, added sugars, or sodium. Maintain a healthy weight Body mass index (BMI) is used to identify weight problems. It estimates body fat based on height and weight. Your health care provider can help determine your BMI and help you achieve or maintain a healthy weight. Get regular exercise Get regular exercise. This is one of the most important things you can do for your health. Most adults should: Exercise for  at least 150 minutes each week. The exercise should increase your heart rate and make you sweat (moderate-intensity exercise). Do strengthening exercises at least twice a week. This is in addition to the moderate-intensity exercise. Spend less time sitting. Even light physical activity can be beneficial. Watch cholesterol and blood lipids Have your blood tested for lipids and cholesterol at 62 years of age, then have this test every 5 years. Have your cholesterol levels checked more often if: Your lipid or cholesterol levels are high. You are older than 62 years of age. You are at high risk for heart disease. What should I know  about cancer screening? Depending on your health history and family history, you may need to have cancer screening at various ages. This may include screening for: Breast cancer. Cervical cancer. Colorectal cancer. Skin cancer. Lung cancer. What should I know about heart disease, diabetes, and high blood pressure? Blood pressure and heart disease High blood pressure causes heart disease and increases the risk of stroke. This is more likely to develop in people who have high blood pressure readings, are of African descent, or are overweight. Have your blood pressure checked: Every 3-5 years if you are 64-39 years of age. Every year if you are 56 years old or older. Diabetes Have regular diabetes screenings. This checks your fasting blood sugar level. Have the screening done: Once every three years after age 48 if you are at a normal weight and have a low risk for diabetes. More often and at a younger age if you are overweight or have a high risk for diabetes. What should I know about preventing infection? Hepatitis B If you have a higher risk for hepatitis B, you should be screened for this virus. Talk with your health care provider to find out if you are at risk for hepatitis B infection. Hepatitis C Testing is recommended for: Everyone born from [birth year] through 1965. Anyone with known risk factors for hepatitis C. Sexually transmitted infections (STIs) Get screened for STIs, including gonorrhea and chlamydia, if: You are sexually active and are younger than 62 years of age. You are older than 62 years of age and your health care provider tells you that you are at risk for this type of infection. Your sexual activity has changed since you were last screened, and you are at increased risk for chlamydia or gonorrhea. Ask your health care provider if you are at risk. Ask your health care provider about whether you are at high risk for HIV. Your health care provider may recommend a prescription  medicine to help prevent HIV infection. If you choose to take medicine to prevent HIV, you should first get tested for HIV. You should then be tested every 3 months for as long as you are taking the medicine. Pregnancy If you are about to stop having your period (premenopausal) and you may become pregnant, seek counseling before you get pregnant. Take 400 to 800 micrograms (mcg) of folic acid every day if you become pregnant. Ask for birth control (contraception) if you want to prevent pregnancy. Osteoporosis and menopause Osteoporosis is a disease in which the bones lose minerals and strength with aging. This can result in bone fractures. If you are 56 years old or older, or if you are at risk for osteoporosis and fractures, ask your health care provider if you should: Be screened for bone loss. Take a calcium or vitamin D supplement to lower your risk of fractures. Be given hormone replacement therapy (HRT) to  treat symptoms of menopause. Follow these instructions at home: Lifestyle Do not use any products that contain nicotine or tobacco, such as cigarettes, e-cigarettes, and chewing tobacco. If you need help quitting, ask your health care provider. Do not use street drugs. Do not share needles. Ask your health care provider for help if you need support or information about quitting drugs. Alcohol use Do not drink alcohol if: Your health care provider tells you not to drink. You are pregnant, may be pregnant, or are planning to become pregnant. If you drink alcohol: Limit how much you use to 0-1 drink a day. Limit intake if you are breastfeeding. Be aware of how much alcohol is in your drink. In the [place] one drink equals one 12 oz bottle of beer (355 mL), one 5 oz glass of wine (148 mL), or one 1 oz glass of hard liquor (44 mL). General instructions Schedule regular health, dental, and eye exams. Stay current with your vaccines. Tell your health care provider if: You often feel  depressed. You have ever been abused or do not feel safe at home. Summary Adopting a healthy lifestyle and getting preventive care are important in promoting health and wellness. Follow your health care provider's instructions about healthy diet, exercising, and getting tested or screened for diseases. Follow your health care provider's instructions on monitoring your cholesterol and blood pressure. This information is not intended to replace advice given to you by your health care provider. Make sure you discuss any questions you have with your health care provider. Document Revised: 08/11/2020 Document Reviewed: 05/27/2018 Elsevier Patient Education  2022 [employer].

## 2021-03-06 ENCOUNTER — Other Ambulatory Visit: Payer: Self-pay | Admitting: *Deleted

## 2021-03-06 NOTE — Patient Outreach (Signed)
[place] [facility]) Care Management  03/06/2021  [name] [name] 1961-01-04 [id number]   Outreach attempt #2, unsuccessful, unable to leave voice message as mailbox is full.  Will send outreach letter and follow up within the next 3-4 business days.  Valente David, [place], MSN [place] [phone]

## 2021-03-08 ENCOUNTER — Other Ambulatory Visit: Payer: Self-pay | Admitting: Internal Medicine

## 2021-03-08 DIAGNOSIS — I10 Essential (primary) hypertension: Secondary | ICD-10-CM

## 2021-03-09 NOTE — Telephone Encounter (Signed)
Requested medications are due for refill today yes (mail order)  Requested medications are on the active medication list yes  Last refill 01/12/21 (mail order)  Last visit 05/19/20, NO SHOW 12/19/20  Future visit scheduled 03/29/21  Notes to clinic Already given a curtesy refill, does have upcoming appt, please assess.

## 2021-03-12 ENCOUNTER — Other Ambulatory Visit: Payer: Self-pay | Admitting: *Deleted

## 2021-03-12 NOTE — Patient Outreach (Signed)
[facility]) Care Management  03/12/2021  Bryannah Boston [birth year] [id number]   Outreach attempt #3, successful however member report this is not a good time to talk, state she will call this care manager back. Will await call, if no call back will make 4th attempt within the next 3-4 weeks.  Valente David, [place], MSN [place] [phone]

## 2021-03-14 ENCOUNTER — Telehealth: Payer: Self-pay

## 2021-03-14 NOTE — Telephone Encounter (Signed)
Attempted to reach pat for PV but unable to reach.  Spoke to daughter, [name] [name] who stated her mother was not around but gone to hospital to see sick brother.  Asked daughter to have [name] [name] to call back to office by 5:00 today to r/s PV and prevent procedure from being cancelled.  She voiced her understanding.

## 2021-03-14 NOTE — Telephone Encounter (Signed)
Pt called to r/s PV for 11/9 with procedure 11/16

## 2021-03-23 ENCOUNTER — Other Ambulatory Visit (HOSPITAL_COMMUNITY): Payer: Self-pay

## 2021-03-26 ENCOUNTER — Other Ambulatory Visit (HOSPITAL_COMMUNITY): Payer: Self-pay

## 2021-03-26 MED FILL — Bictegravir-Emtricitabine-Tenofovir AF Tab 50-200-25 MG: ORAL | 30 days supply | Qty: 30 | Fill #6 | Status: AC

## 2021-03-28 ENCOUNTER — Encounter: Payer: Medicare Other | Admitting: Gastroenterology

## 2021-03-29 ENCOUNTER — Ambulatory Visit: Payer: Medicare Other | Admitting: Internal Medicine

## 2021-04-03 ENCOUNTER — Other Ambulatory Visit: Payer: Self-pay | Admitting: Gastroenterology

## 2021-04-03 ENCOUNTER — Other Ambulatory Visit (HOSPITAL_COMMUNITY): Payer: Self-pay

## 2021-04-05 ENCOUNTER — Other Ambulatory Visit: Payer: Self-pay | Admitting: *Deleted

## 2021-04-05 NOTE — Patient Outreach (Signed)
[facility]) Care Management  04/05/2021  [name] [name] June 04, 1961 [id number]   Outreach attempt #4, unsuccessful.  Female answering phone state member not available, request made for member to call this care manager back.  No response from member after multiple unsuccessful outreach attempts and letter sent.  Will close case at this time due to inability to maintain contact.  Will notify member and primary MD of case closure.  Valente David, [place], MSN [place] [phone]

## 2021-04-13 ENCOUNTER — Ambulatory Visit (HOSPITAL_COMMUNITY): Admission: RE | Admit: 2021-04-13 | Payer: Medicare Other | Source: Ambulatory Visit

## 2021-04-19 ENCOUNTER — Other Ambulatory Visit (HOSPITAL_COMMUNITY): Payer: Self-pay

## 2021-04-19 ENCOUNTER — Other Ambulatory Visit: Payer: Self-pay | Admitting: Infectious Disease

## 2021-04-19 DIAGNOSIS — B20 Human immunodeficiency virus [HIV] disease: Secondary | ICD-10-CM

## 2021-04-19 MED ORDER — BIKTARVY 50-200-25 MG PO TABS
1.0000 | ORAL_TABLET | Freq: Every day | ORAL | 0 refills | Status: DC
  Filled 2021-04-19 (×2): qty 30, 30d supply, fill #0

## 2021-04-20 ENCOUNTER — Other Ambulatory Visit (HOSPITAL_COMMUNITY): Payer: Self-pay

## 2021-04-24 ENCOUNTER — Other Ambulatory Visit (HOSPITAL_COMMUNITY): Payer: Self-pay

## 2021-04-25 ENCOUNTER — Other Ambulatory Visit: Payer: Self-pay | Admitting: Gastroenterology

## 2021-04-25 ENCOUNTER — Ambulatory Visit (AMBULATORY_SURGERY_CENTER): Payer: Medicare Other

## 2021-04-25 ENCOUNTER — Other Ambulatory Visit (HOSPITAL_COMMUNITY): Payer: Self-pay

## 2021-04-25 ENCOUNTER — Other Ambulatory Visit: Payer: Self-pay

## 2021-04-25 VITALS — Ht 68.0 in | Wt 125.0 lb

## 2021-04-25 DIAGNOSIS — K921 Melena: Secondary | ICD-10-CM

## 2021-04-25 DIAGNOSIS — K259 Gastric ulcer, unspecified as acute or chronic, without hemorrhage or perforation: Secondary | ICD-10-CM

## 2021-04-25 DIAGNOSIS — K5100 Ulcerative (chronic) pancolitis without complications: Secondary | ICD-10-CM

## 2021-04-25 MED ORDER — PLENVU 140 G PO SOLR
1.0000 | ORAL | 0 refills | Status: DC
  Filled 2021-04-25: qty 3, 1d supply, fill #0

## 2021-04-25 NOTE — Progress Notes (Signed)
Pre visit completed via phone call; Patient verified name, DOB, and address;  No egg or soy allergy known to patient  No issues known to pt with past sedation with any surgeries or procedures Patient denies ever being told they had issues or difficulty with intubation  No FH of Malignant Hyperthermia Pt is not on diet pills Pt is not on home 02  Pt is not on blood thinners  Pt denies issues with constipation at this time;  No A fib or A flutter Pt is fully vaccinated for Covid x 2 Medicare Coupon given to pt in PV today and NO PA's for preps discussed with pt In PV today  Discussed with pt there will be an out-of-pocket cost for prep and that varies from $0 to 70 +  dollars - pt verbalized understanding  Due to the COVID-19 pandemic we are asking patients to follow certain guidelines in PV and the [place]   Pt aware of COVID protocols and [facility] guidelines

## 2021-04-26 ENCOUNTER — Other Ambulatory Visit (HOSPITAL_COMMUNITY): Payer: Self-pay

## 2021-04-26 ENCOUNTER — Other Ambulatory Visit: Payer: Self-pay | Admitting: Gastroenterology

## 2021-04-26 DIAGNOSIS — K921 Melena: Secondary | ICD-10-CM

## 2021-04-26 DIAGNOSIS — K259 Gastric ulcer, unspecified as acute or chronic, without hemorrhage or perforation: Secondary | ICD-10-CM

## 2021-04-26 DIAGNOSIS — K5100 Ulcerative (chronic) pancolitis without complications: Secondary | ICD-10-CM

## 2021-04-27 ENCOUNTER — Other Ambulatory Visit (HOSPITAL_COMMUNITY): Payer: Self-pay

## 2021-04-27 MED ORDER — NA SULFATE-K SULFATE-MG SULF 17.5-3.13-1.6 GM/177ML PO SOLN
1.0000 | Freq: Once | ORAL | 0 refills | Status: AC
  Filled 2021-04-27: qty 354, 1d supply, fill #0

## 2021-04-27 NOTE — Telephone Encounter (Signed)
Changed to  2 day Suprep - sent to [place] and new instructions Via My chart  Sent pt a My chart message with changes

## 2021-05-01 ENCOUNTER — Ambulatory Visit: Payer: Medicare Other | Admitting: Infectious Disease

## 2021-05-02 ENCOUNTER — Encounter: Payer: Self-pay | Admitting: Gastroenterology

## 2021-05-02 ENCOUNTER — Ambulatory Visit (AMBULATORY_SURGERY_CENTER): Payer: Medicare Other | Admitting: Gastroenterology

## 2021-05-02 ENCOUNTER — Other Ambulatory Visit: Payer: Self-pay

## 2021-05-02 VITALS — BP 159/87 | HR 64 | Temp 98.0°F | Resp 9 | Ht 68.0 in | Wt 125.0 lb

## 2021-05-02 DIAGNOSIS — K2970 Gastritis, unspecified, without bleeding: Secondary | ICD-10-CM | POA: Diagnosis not present

## 2021-05-02 DIAGNOSIS — K259 Gastric ulcer, unspecified as acute or chronic, without hemorrhage or perforation: Secondary | ICD-10-CM | POA: Diagnosis not present

## 2021-05-02 DIAGNOSIS — K529 Noninfective gastroenteritis and colitis, unspecified: Secondary | ICD-10-CM | POA: Diagnosis not present

## 2021-05-02 DIAGNOSIS — K51214 Ulcerative (chronic) proctitis with abscess: Secondary | ICD-10-CM | POA: Diagnosis not present

## 2021-05-02 DIAGNOSIS — K921 Melena: Secondary | ICD-10-CM

## 2021-05-02 DIAGNOSIS — K5100 Ulcerative (chronic) pancolitis without complications: Secondary | ICD-10-CM

## 2021-05-02 DIAGNOSIS — K3189 Other diseases of stomach and duodenum: Secondary | ICD-10-CM | POA: Diagnosis not present

## 2021-05-02 DIAGNOSIS — K254 Chronic or unspecified gastric ulcer with hemorrhage: Secondary | ICD-10-CM | POA: Diagnosis not present

## 2021-05-02 DIAGNOSIS — E785 Hyperlipidemia, unspecified: Secondary | ICD-10-CM | POA: Diagnosis not present

## 2021-05-02 DIAGNOSIS — I10 Essential (primary) hypertension: Secondary | ICD-10-CM | POA: Diagnosis not present

## 2021-05-02 DIAGNOSIS — K2951 Unspecified chronic gastritis with bleeding: Secondary | ICD-10-CM | POA: Diagnosis not present

## 2021-05-02 DIAGNOSIS — K5150 Left sided colitis without complications: Secondary | ICD-10-CM | POA: Diagnosis not present

## 2021-05-02 MED ORDER — SODIUM CHLORIDE 0.9 % IV SOLN: 500.0000 mL | Freq: Once | INTRAVENOUS | Status: DC

## 2021-05-02 NOTE — Op Note (Signed)
[name] Patient Name: [name] [name] Procedure Date: 05/02/2021 9:22 AM MRN: [id number] Endoscopist: Thornton Park MD, MD Age: 60 Referring MD:  Date of Birth: 05-18-1961 Gender: Female Account #: [id number] Procedure:                Upper GI endoscopy Indications:              Chronic antral ulcer since 03/2019: EGD 03/2019                            path showed goblet cell metaplasia, H.                           pylori negative; EGD 05/2019 path showed marked                            chronic active gastritis, neg for H.                           pylori, + cytologic atypia but indefinite for                            dysplasia; EGD 09/2019 cytologic atypica with IM                           without dysplasia; EGD 04/2020 negative for H.                            pylori, + for focal HGD in IM; EUS 05/2020 showed                            ulcer with intestinal metaplasia, no obvious                            malignancy; surgical consultation raised                            possibility of Crohn's ulcer and repeat EGD                            recommended Medicines:                Monitored Anesthesia Care Procedure:                Pre-Anesthesia Assessment:                           - Prior to the procedure, a History and Physical                            was performed, and patient medications and                            allergies were reviewed. The patient's tolerance of  previous anesthesia was also reviewed. The risks                            and benefits of the procedure and the sedation                            options and risks were discussed with the patient.                            All questions were answered, and informed consent                            was obtained. Prior Anticoagulants: The patient has                            taken no previous anticoagulant or antiplatelet                            agents.  ASA Grade Assessment: III - A patient with                            severe systemic disease. After reviewing the risks                            and benefits, the patient was deemed in                            satisfactory condition to undergo the procedure.                           After obtaining informed consent, the endoscope was                            passed under direct vision. Throughout the                            procedure, the patient's blood pressure, pulse, and                            oxygen saturations were monitored continuously. The                            Endoscope was introduced through the mouth, and                            advanced to the third part of duodenum. The upper                            GI endoscopy was accomplished without difficulty.                            The patient tolerated the procedure well. Scope In: Scope Out: Findings:  The examined esophagus was normal. The z-line is                            located 36 cm from the incisors.                           One 845m non-bleeding deeply-cratered gastric                            ulcer with no stigmata of bleeding was found in the                            gastric antrum. The margins around the ulcer are                            heaped. The remainder of the gastric mucosa had                            slightly increased vascularity. Biopsies were taken                            from the ulcer margins as well as for gastric                            mapping with a cold forceps for histology.                            Estimated blood loss was minimal.                           The examined duodenum was normal. Complications:            No immediate complications. Estimated blood loss:                            Minimal. Estimated Blood Loss:     Estimated blood loss was minimal. Impression:               - Normal esophagus.                           -  Chronic, non-bleeding gastric ulcer with no                            stigmata of bleeding. Biopsied.                           - Normal examined duodenum. Recommendation:           - Patient has a contact number available for                            emergencies. The signs and symptoms of potential                            delayed complications were discussed  with the                            patient. Return to normal activities tomorrow.                            Written discharge instructions were provided to the                            patient.                           - Resume previous diet.                           - Continue present medications.                           - Await pathology results.                           - Follow-up with Dr. Michaelle Birks after pathology                            results are available Thornton Park MD, MD 05/02/2021 10:14:32 AM This report has been signed electronically.

## 2021-05-02 NOTE — Op Note (Addendum)
[place] Patient Name: [name] [name] Procedure Date: 05/02/2021 9:17 AM MRN: [id number] Endoscopist: Thornton Park MD, MD Age: 60 Referring MD:  Date of Birth: 1960-09-28 Gender: Female Account #: [id number] Procedure:                Colonoscopy Indications:              Follow-up of left-sided chronic ulcerative colitis,                            recent diarrhea Medicines:                Monitored Anesthesia Care Procedure:                Pre-Anesthesia Assessment:                           - Prior to the procedure, a History and Physical                            was performed, and patient medications and                            allergies were reviewed. The patient's tolerance of                            previous anesthesia was also reviewed. The risks                            and benefits of the procedure and the sedation                            options and risks were discussed with the patient.                            All questions were answered, and informed consent                            was obtained. Prior Anticoagulants: The patient has                            taken no previous anticoagulant or antiplatelet                            agents. ASA Grade Assessment: III - A patient with                            severe systemic disease. After reviewing the risks                            and benefits, the patient was deemed in                            satisfactory condition to undergo the procedure.  After obtaining informed consent, the colonoscope                            was passed under direct vision. Throughout the                            procedure, the patient's blood pressure, pulse, and                            oxygen saturations were monitored continuously. The                            CF [id number] #[id number] was introduced through the anus                            and advanced to the 5 cm into the  ileum. The                            colonoscopy was performed without difficulty. The                            patient tolerated the procedure well. The quality                            of the bowel preparation was adequate to identify                            polyps 6 mm and larger in size. The terminal ileum,                            ileocecal valve, appendiceal orifice, and rectum                            were photographed. Scope In: 9:47:34 AM Scope Out: 10:02:53 AM Scope Withdrawal Time: 0 hours 10 minutes 34 seconds  Total Procedure Duration: 0 hours 15 minutes 19 seconds  Findings:                 The perianal and digital rectal examinations were                            normal.                           Small pseudopolyps were found throughout entire                            colon.                           Focal erythema found in the distal sigmoid colon.                            Biopsies were taken with a cold forceps for  histology. Estimated blood loss was minimal.                           The remainder of the examined colonic mucosa                            apepared normal. However, stool is present                            throughout the colon limiting a complete evaluation                            for small or flat polyps. Biopsies were taken with                            a cold forceps for histology. Estimated blood loss                            was minimal.                           The terminal ileum appeared normal. Biopsies were                            taken with a cold forceps for histology. Estimated                            blood loss was minimal.                           The exam was otherwise without abnormality on                            direct and retroflexion views. Complications:            No immediate complications. Estimated blood loss:                            Minimal. Estimated Blood  Loss:     Estimated blood loss was minimal. Impression:               - Pseudopolyps in the entire examined colon.                           - Erythematous mucosa in the distal sigmoid colon.                            Biopsied.                           - No other endoscopic evidence for colitis.                            Biopsied.                           -  Retained stool limiting a full evaluation.                           - The examined portion of the ileum was normal.                            Biopsied.                           - The examination was otherwise normal on direct                            and retroflexion views. Recommendation:           - Patient has a contact number available for                            emergencies. The signs and symptoms of potential                            delayed complications were discussed with the                            patient. Return to normal activities tomorrow.                            Written discharge instructions were provided to the                            patient.                           - Resume previous diet.                           - Continue present medications.                           - Await pathology results.                           - Repeat colonoscopy date to be determined after                            pending pathology results are reviewed for                            surveillance.                           - Emerging evidence supports eating a diet of                            fruits, vegetables, grains, calcium, and yogurt                            while reducing red meat and alcohol may  reduce the                            risk of colon cancer. Thornton Park MD, MD 05/02/2021 10:19:23 AM This report has been signed electronically.

## 2021-05-02 NOTE — Progress Notes (Signed)
Pt's states no medical or surgical changes since previsit or office visit. 

## 2021-05-02 NOTE — Patient Instructions (Signed)
YOU HAD AN ENDOSCOPIC PROCEDURE TODAY AT [facility]:   Refer to the procedure report that was given to you for any specific questions about what was found during the examination.  If the procedure report does not answer your questions, please call your gastroenterologist to clarify.  If you requested that your care partner not be given the details of your procedure findings, then the procedure report has been included in a sealed envelope for you to review at your convenience later.  Read all of the handouts given to you by your recovery room nurse.  YOU SHOULD EXPECT: Some feelings of bloating in the abdomen. Passage of more gas than usual.  Walking can help get rid of the air that was put into your GI tract during the procedure and reduce the bloating. If you had a lower endoscopy (such as a colonoscopy or flexible sigmoidoscopy) you may notice spotting of blood in your stool or on the toilet paper. If you underwent a bowel prep for your procedure, you may not have a normal bowel movement for a few days.  Please Note:  You might notice some irritation and congestion in your nose or some drainage.  This is from the oxygen used during your procedure.  There is no need for concern and it should clear up in a day or so.  SYMPTOMS TO REPORT IMMEDIATELY:  Following lower endoscopy (colonoscopy or flexible sigmoidoscopy):  Excessive amounts of blood in the stool  Significant tenderness or worsening of abdominal pains  Swelling of the abdomen that is new, acute  Fever of 100F or higher  Following upper endoscopy (EGD)  Vomiting of blood or coffee ground material  New chest pain or pain under the shoulder blades  Painful or persistently difficult swallowing  New shortness of breath  Fever of 100F or higher  Black, tarry-looking stools  For urgent or emergent issues, a gastroenterologist can be reached at any hour by calling [phone]. Do not use MyChart messaging for urgent  concerns.    DIET:  We do recommend a small meal at first, but then you may proceed to your regular diet.  Drink plenty of fluids but you should avoid alcoholic beverages for 24 hours.  ACTIVITY:  You should plan to take it easy for the rest of today and you should NOT DRIVE or use heavy machinery until tomorrow (because of the sedation medicines used during the test).    FOLLOW UP: Our staff will call the number listed on your records 48-72 hours following your procedure to check on you and address any questions or concerns that you may have regarding the information given to you following your procedure. If we do not reach you, we will leave a message.  We will attempt to reach you two times.  During this call, we will ask if you have developed any symptoms of COVID 19. If you develop any symptoms (ie: fever, flu-like symptoms, shortness of breath, cough etc.) before then, please call [phone].  If you test positive for Covid 19 in the 2 weeks post procedure, please call and report this information to [place].    If any biopsies were taken you will be contacted by phone or by letter within the next 1-3 weeks.  Please call us at [phone] if you have not heard about the biopsies in 3 weeks.    SIGNATURES/CONFIDENTIALITY: You and/or your care partner have signed paperwork which will be entered into your electronic medical record.  These signatures attest to the fact that that the information above on your After Visit Summary has been reviewed and is understood.  Full responsibility of the confidentiality of this discharge information lies with you and/or your care-partner.

## 2021-05-02 NOTE — Progress Notes (Signed)
Referring Provider: Ladell Pier, MD Primary Care Physician:  Ladell Pier, MD  Reason for Procedure:  Gastric ulcer   IMPRESSION:  Non-healing gastric ulcer with intestinal metaplasia and dysplasia Family history of gastric cancer Ulcerative colitis  PLAN: EGD and Colonoscopy in the [place] today   HPI: [name] [name] is a 60 y.o. female presents for endoscopic reevaluation of a gastric ulcer with last biopsies showing dysplasia. Surgery concerned about possible upper tract IBD.  Colonoscopy recommended to restage her ulcerative colitis with concerns for possible Crohn's. Please see the last office note from 01/03/21. The procedure have been rescheduled by the patient multiple times.     Past Medical History:  Diagnosis Date   Cavities 02/02/2015   Chronic gastric ulcer    Depression    hx of- no meds at this time   GERD (gastroesophageal reflux disease) Dx 2014   on meds   GI bleed    Grieving 05/04/2020   HIV (human immunodeficiency virus infection) ([place]) Dx 2015   on meds   Hyperlipidemia    on meds   Hypertension Dx 2014   on meds   Primary biliary cirrhosis ([place]) 07/03/2018   Substance abuse ([place])    Quit 2008   Ulcerative colitis [facility])     Past Surgical History:  Procedure Laterality Date   ABDOMINAL HYSTERECTOMY  2001   done in [facility], patient unsure of indication   BIOPSY  04/27/2020   Procedure: BIOPSY;  Surgeon: Thornton Park, MD;  Location: [facility];  Service: Gastroenterology;;   BIOPSY  06/15/2020   Procedure: BIOPSY;  Surgeon: Milus Banister, MD;  Location: [facility];  Service: Gastroenterology;;   COLONOSCOPY  2020   KB-MAC-suprep (adeq)   ESOPHAGOGASTRODUODENOSCOPY     ESOPHAGOGASTRODUODENOSCOPY (EGD) WITH PROPOFOL N/A 04/27/2020   Procedure: ESOPHAGOGASTRODUODENOSCOPY (EGD) WITH PROPOFOL;  Surgeon: Thornton Park, MD;  Location: [facility];  Service: Gastroenterology;  Laterality: N/A;    ESOPHAGOGASTRODUODENOSCOPY (EGD) WITH PROPOFOL N/A 06/15/2020   Procedure: ESOPHAGOGASTRODUODENOSCOPY (EGD) WITH PROPOFOL;  Surgeon: Milus Banister, MD;  Location: [facility];  Service: Gastroenterology;  Laterality: N/A;   TEE WITHOUT CARDIOVERSION N/A 05/02/2014   Procedure: TRANSESOPHAGEAL ECHOCARDIOGRAM (TEE);  Surgeon: Larey Dresser, MD;  Location: [facility];  Service: Cardiovascular;  Laterality: N/A;   UPPER ESOPHAGEAL ENDOSCOPIC ULTRASOUND (EUS) N/A 06/15/2020   Procedure: UPPER ESOPHAGEAL ENDOSCOPIC ULTRASOUND (EUS);  Surgeon: Milus Banister, MD;  Location: [facility];  Service: Gastroenterology;  Laterality: N/A;   VIDEO BRONCHOSCOPY Bilateral 04/27/2014   Procedure: VIDEO BRONCHOSCOPY WITH FLUORO;  Surgeon: Rigoberto Noel, MD;  Location: [facility];  Service: Cardiopulmonary;  Laterality: Bilateral;       Allergies as of 05/02/2021   (No Known Allergies)    Family History  Problem Relation Age of Onset   Diabetes Mother    Stroke Mother    Cancer Father        ?   Alcoholism Father    Pancreatitis Sister    Diabetes Sister    Prostate cancer Brother    Colon cancer Paternal Uncle 43   Bipolar disorder Daughter    Esophageal cancer Neg Hx    Rectal cancer Neg Hx    Stomach cancer Neg Hx    Colon polyps Neg Hx      Physical Exam: General:   Alert,  well-nourished, pleasant and cooperative in NAD Head:  Normocephalic and atraumatic. Eyes:  Sclera clear, no icterus.   Conjunctiva pink. Mouth:  No deformity or lesions.   Neck:  Supple; no masses or thyromegaly. Lungs:  Clear throughout to auscultation.   No wheezes. Heart:  Regular rate and rhythm; no murmurs. Abdomen:  Soft, non-tender, nondistended, normal bowel sounds, no rebound or guarding.  Msk:  Symmetrical. No boney deformities LAD: No inguinal or umbilical LAD Extremities:  No clubbing or edema. Neurologic:  Alert and  oriented x4;  grossly nonfocal Skin:  No obvious rash or  bruise. Psych:  Alert and cooperative. Normal mood and affect.    Permelia Bamba L. Tarri Glenn, MD, MPH 05/02/2021, 9:24 AM

## 2021-05-02 NOTE — Progress Notes (Signed)
Report to PACU, RN, vss, BBS= Clear.  

## 2021-05-02 NOTE — Progress Notes (Signed)
Called to room to assist during endoscopic procedure.  Patient ID and intended procedure confirmed with present staff. Received instructions for my participation in the procedure from the performing physician.  

## 2021-05-03 ENCOUNTER — Telehealth: Payer: Self-pay

## 2021-05-03 NOTE — Telephone Encounter (Signed)
-----   Message from Aleatha Borer, LPN sent at 97/47/1855 12:12 PM EST ----- Your next OV is 12/12. She has been scheduled as last appt of the day with notes included in the appt (serve as a reminder should the appt be rescheduled). Would need to overbook if needing sooner.  LVM for pt to return my call. ----- Message ----- From: Thornton Park, MD Sent: 05/02/2021  10:20 AM EST To: Aleatha Borer, LPN  Ms Lorenz needs office follow-up with me. Ideally, the last appointment as the day as this appointment will take awhile.  She also needs to follow-up with Dr. Michaelle Birks.  Thank you.  KLB

## 2021-05-03 NOTE — Telephone Encounter (Signed)
SECOND ATTEMPT:  Called pt to make her aware of appt date/time. Advised we are awaiting path. Will ensure she has an appt with Dr. Zenia Resides once we have received results. Verbalized acceptance and understanding.  From 05/02/21 procedure note recommendations:  - Await pathology results. - Follow-up with Dr. Michaelle Birks after pathology results are available

## 2021-05-04 ENCOUNTER — Telehealth: Payer: Self-pay

## 2021-05-04 ENCOUNTER — Telehealth: Payer: Self-pay | Admitting: *Deleted

## 2021-05-04 NOTE — Telephone Encounter (Signed)
  Follow up Call-  Call back number 05/02/2021 10/13/2019 06/17/2019 03/31/2019  Post procedure Call Back phone  # [phone]  Permission to leave phone message Yes Yes Yes Yes  Some recent data might be hidden     Patient questions:  Do you have a fever, pain , or abdominal swelling? No. Pain Score  0 *  Have you tolerated food without any problems? Yes.    Have you been able to return to your normal activities? Yes.    Do you have any questions about your discharge instructions: Diet   No. Medications  No. Follow up visit  No.  Do you have questions or concerns about your Care? No.  Actions: * If pain score is 4 or above: No action needed, pain <4.  Have you developed a fever since your procedure? no  2.   Have you had an respiratory symptoms (SOB or cough) since your procedure? no  3.   Have you tested positive for COVID 19 since your procedure no  4.   Have you had any family members/close contacts diagnosed with the COVID 19 since your procedure?  no   If yes to any of these questions please route to Joylene John, RN and Joella Prince, RN

## 2021-05-04 NOTE — Telephone Encounter (Signed)
Second post procedure follow up call, no answer.

## 2021-05-08 ENCOUNTER — Telehealth: Payer: Self-pay | Admitting: Gastroenterology

## 2021-05-09 NOTE — Telephone Encounter (Signed)
Returned pt call to inquire further about her symptoms. States her abd pain has remained stable and unchanged since OV and colonoscopy. Pt did express concern about blood she had noticed in the toilet. Pt states it was a small amount and only noticed after 1 defecation. Has not noticed any further episodes of blood since. Pt confirms she is taking Sulfasalazine and Budesonide as Rx'd. No longer on Prednisone taper. MAR updated to reflect. Pt states, overall she is feeling pretty good but was just concerned with the 1 episode of blood in the toilet. Advised this would be r/t UC. However, since she is not having any fevers, chills, worsening abd pain/cramping/bloating, worsening diarrhea or worsening rectal bleeding or blood in stool, fatigue, loss of appetite, wt loss, advised she continue her medication regimen as prescribed. Should symptoms become worse over the holiday and holiday weekend, advised she proceed to ED for further eval and tx't. If symptoms remain unchanged AND without need for proceeding to ED, requested that pt call me on Monday with an update of her symptoms to determine if Prednisone taper should be considered. In the meantime, advised pt remain well hydrated with 64 ounces or more of water. Also advised, during the holiday she may want to consider a low residue diet to reduce risk for flare. Reminded to refrain from consuming dairy, alcohol, caffeine, spicy foods, artifical sweeteners and dairy. Pt is aware that we are still awaiting path results. Once Dr. Tarri Glenn has reviewed with Dr. Zenia Resides, we will call with further recommendations. Verbalized acceptance and understanding. Routing to Dr. Tarri Glenn for further review and advice.

## 2021-05-11 NOTE — Telephone Encounter (Signed)
I agree with your records. Rowasa enemas recommended for left-sided activity. Please see the path results comments for additional details if needed. Thanks.

## 2021-05-15 ENCOUNTER — Other Ambulatory Visit (HOSPITAL_COMMUNITY): Payer: Self-pay

## 2021-05-16 ENCOUNTER — Other Ambulatory Visit (HOSPITAL_COMMUNITY): Payer: Self-pay

## 2021-05-16 ENCOUNTER — Other Ambulatory Visit: Payer: Self-pay

## 2021-05-16 ENCOUNTER — Other Ambulatory Visit: Payer: Self-pay | Admitting: Internal Medicine

## 2021-05-16 DIAGNOSIS — K5100 Ulcerative (chronic) pancolitis without complications: Secondary | ICD-10-CM

## 2021-05-16 DIAGNOSIS — I10 Essential (primary) hypertension: Secondary | ICD-10-CM

## 2021-05-16 MED ORDER — MESALAMINE 4 G RE ENEM
ENEMA | RECTAL | 3 refills | Status: AC
  Filled 2021-05-16: qty 3600, 60d supply, fill #0
  Filled 2021-05-16: qty 3360, 56d supply, fill #0
  Filled 2021-05-16: qty 3360, 42d supply, fill #0
  Filled 2022-04-16: qty 3360, 56d supply, fill #1

## 2021-05-16 NOTE — Telephone Encounter (Signed)
Refer to pathology encounter. Pt to call Dr. Ayesha Rumpf office to self schedule.

## 2021-05-17 ENCOUNTER — Other Ambulatory Visit (HOSPITAL_COMMUNITY): Payer: Self-pay

## 2021-05-18 ENCOUNTER — Other Ambulatory Visit (HOSPITAL_COMMUNITY): Payer: Self-pay

## 2021-05-18 ENCOUNTER — Other Ambulatory Visit: Payer: Self-pay | Admitting: Infectious Disease

## 2021-05-18 DIAGNOSIS — B20 Human immunodeficiency virus [HIV] disease: Secondary | ICD-10-CM

## 2021-05-18 MED ORDER — AMLODIPINE BESYLATE 10 MG PO TABS
10.0000 mg | ORAL_TABLET | Freq: Every day | ORAL | 0 refills | Status: DC
  Filled 2021-05-18: qty 30, 30d supply, fill #0

## 2021-05-18 NOTE — Telephone Encounter (Signed)
Patient scheduled for 12/9 and still has enough Biktarvy until her appt.

## 2021-05-20 ENCOUNTER — Other Ambulatory Visit (HOSPITAL_COMMUNITY): Payer: Self-pay

## 2021-05-20 ENCOUNTER — Other Ambulatory Visit: Payer: Self-pay | Admitting: Infectious Disease

## 2021-05-20 DIAGNOSIS — B20 Human immunodeficiency virus [HIV] disease: Secondary | ICD-10-CM

## 2021-05-21 ENCOUNTER — Other Ambulatory Visit (HOSPITAL_COMMUNITY): Payer: Self-pay

## 2021-05-21 ENCOUNTER — Other Ambulatory Visit: Payer: Self-pay | Admitting: Gastroenterology

## 2021-05-21 DIAGNOSIS — K5190 Ulcerative colitis, unspecified, without complications: Secondary | ICD-10-CM

## 2021-05-21 MED ORDER — BIKTARVY 50-200-25 MG PO TABS
1.0000 | ORAL_TABLET | Freq: Every day | ORAL | 0 refills | Status: AC
  Filled 2021-05-21: qty 30, 30d supply, fill #0

## 2021-05-22 ENCOUNTER — Other Ambulatory Visit (HOSPITAL_COMMUNITY): Payer: Self-pay

## 2021-05-25 ENCOUNTER — Ambulatory Visit: Payer: Medicare Other | Admitting: Infectious Disease

## 2021-05-28 ENCOUNTER — Ambulatory Visit: Payer: Medicare Other | Admitting: Gastroenterology

## 2021-06-01 ENCOUNTER — Other Ambulatory Visit: Payer: Self-pay | Admitting: Internal Medicine

## 2021-06-01 DIAGNOSIS — I10 Essential (primary) hypertension: Secondary | ICD-10-CM

## 2021-06-02 NOTE — Telephone Encounter (Signed)
pt has enough refill to last until upcoming appt 06/05/21 Last RF 03/09/21 #90   Requested Prescriptions  Refused Prescriptions Disp Refills   lisinopril (ZESTRIL) 5 MG tablet [Pharmacy Med Name: Lisinopril 5 MG Oral Tablet] 90 tablet 3    Sig: TAKE 1 TABLET BY MOUTH  DAILY     Cardiovascular:  ACE Inhibitors Failed - 06/01/2021 10:56 PM      Failed - Last BP in normal range    BP Readings from Last 1 Encounters:  05/02/21 (!) 159/87         Passed - Cr in normal range and within 180 days    Creat  Date Value Ref Range Status  04/20/2020 0.81 0.50 - 0.99 mg/dL Final    Comment:    For patients >66 years of age, the reference limit for Creatinine is approximately 13% higher for people identified as African-American. .    Creatinine, Ser  Date Value Ref Range Status  01/03/2021 0.82 0.40 - 1.20 mg/dL Final   Creatinine, Urine  Date Value Ref Range Status  05/06/2018 154 20 - 275 mg/dL Final         Passed - K in normal range and within 180 days    Potassium  Date Value Ref Range Status  01/03/2021 3.9 3.5 - 5.1 mEq/L Final         Passed - Patient is not pregnant      Passed - Valid encounter within last 6 months    Recent Outpatient Visits          5 months ago No-show for appointment   [place], Deborah B, MD   1 year ago Essential hypertension   [place], Deborah B, MD   1 year ago Essential hypertension   [place], Deborah B, MD   2 years ago Essential hypertension   [place], Deborah B, MD   2 years ago Pap smear for cervical cancer screening   [place], MD      Future Appointments            In 3 days Ladell Pier, MD [place]

## 2021-06-05 ENCOUNTER — Ambulatory Visit: Payer: Medicare Other | Admitting: Internal Medicine

## 2021-06-18 ENCOUNTER — Other Ambulatory Visit (HOSPITAL_COMMUNITY): Payer: Self-pay

## 2021-06-28 ENCOUNTER — Other Ambulatory Visit: Payer: Self-pay | Admitting: Gastroenterology

## 2021-06-28 ENCOUNTER — Other Ambulatory Visit: Payer: Self-pay | Admitting: Infectious Disease

## 2021-06-28 ENCOUNTER — Ambulatory Visit: Payer: Medicare Other | Admitting: Gastroenterology

## 2021-06-28 ENCOUNTER — Other Ambulatory Visit (HOSPITAL_COMMUNITY): Payer: Self-pay

## 2021-06-28 DIAGNOSIS — B20 Human immunodeficiency virus [HIV] disease: Secondary | ICD-10-CM

## 2021-06-29 ENCOUNTER — Other Ambulatory Visit (HOSPITAL_COMMUNITY): Payer: Self-pay

## 2021-07-17 ENCOUNTER — Other Ambulatory Visit (HOSPITAL_COMMUNITY): Payer: Self-pay

## 2021-07-17 ENCOUNTER — Telehealth: Payer: Self-pay

## 2021-07-17 NOTE — Telephone Encounter (Signed)
Patient last seen 04/2020 - called to offer appointment, no answer. Left HIPAA compliant voicemail requesting callback.   Beryle Flock, RN

## 2021-07-19 ENCOUNTER — Other Ambulatory Visit: Payer: Self-pay | Admitting: Gastroenterology

## 2021-07-19 ENCOUNTER — Other Ambulatory Visit: Payer: Self-pay | Admitting: Internal Medicine

## 2021-07-19 DIAGNOSIS — I10 Essential (primary) hypertension: Secondary | ICD-10-CM

## 2021-07-19 NOTE — Telephone Encounter (Signed)
Requested medication (s) are due for refill today - yes  Requested medication (s) are on the active medication list -yes  Future visit scheduled -yes  Last refill: 03/09/21 #90  Notes to clinic: Request RF: Patient has had more than one courtesy RF- sent for review   Requested Prescriptions  Pending Prescriptions Disp Refills   lisinopril (ZESTRIL) 5 MG tablet [Pharmacy Med Name: Lisinopril 5 MG Oral Tablet] 90 tablet 3    Sig: TAKE 1 TABLET BY MOUTH  DAILY     Cardiovascular:  ACE Inhibitors Failed - 07/19/2021  2:01 PM      Failed - Cr in normal range and within 180 days    Creat  Date Value Ref Range Status  04/20/2020 0.81 0.50 - 0.99 mg/dL Final    Comment:    For patients >17 years of age, the reference limit for Creatinine is approximately 13% higher for people identified as African-American. .    Creatinine, Ser  Date Value Ref Range Status  01/03/2021 0.82 0.40 - 1.20 mg/dL Final   Creatinine, Urine  Date Value Ref Range Status  05/06/2018 154 20 - 275 mg/dL Final          Failed - K in normal range and within 180 days    Potassium  Date Value Ref Range Status  01/03/2021 3.9 3.5 - 5.1 mEq/L Final          Failed - Last BP in normal range    BP Readings from Last 1 Encounters:  05/02/21 (!) 159/87          Failed - Valid encounter within last 6 months    Recent Outpatient Visits           7 months ago No-show for appointment   [place], Deborah B, MD   1 year ago Essential hypertension   [place], Deborah B, MD   2 years ago Essential hypertension   [place], Deborah B, MD   2 years ago Essential hypertension   [place], MD   2 years ago Pap smear for cervical cancer screening   [place], MD       Future Appointments              In 1 month Ladell Pier, MD [place] - Patient is not pregnant         Requested Prescriptions  Pending Prescriptions Disp Refills   lisinopril (ZESTRIL) 5 MG tablet [Pharmacy Med Name: Lisinopril 5 MG Oral Tablet] 90 tablet 3    Sig: TAKE 1 TABLET BY MOUTH  DAILY     Cardiovascular:  ACE Inhibitors Failed - 07/19/2021  2:01 PM      Failed - Cr in normal range and within 180 days    Creat  Date Value Ref Range Status  04/20/2020 0.81 0.50 - 0.99 mg/dL Final    Comment:    For patients >4 years of age, the reference limit for Creatinine is approximately 13% higher for people identified as African-American. .    Creatinine, Ser  Date Value Ref Range Status  01/03/2021 0.82 0.40 - 1.20 mg/dL Final   Creatinine, Urine  Date Value Ref Range Status  05/06/2018 154 20 - 275  mg/dL Final          Failed - K in normal range and within 180 days    Potassium  Date Value Ref Range Status  01/03/2021 3.9 3.5 - 5.1 mEq/L Final          Failed - Last BP in normal range    BP Readings from Last 1 Encounters:  05/02/21 (!) 159/87          Failed - Valid encounter within last 6 months    Recent Outpatient Visits           7 months ago No-show for appointment   [place], Deborah B, MD   1 year ago Essential hypertension   [place], MD   2 years ago Essential hypertension   [place], Deborah B, MD   2 years ago Essential hypertension   [place], Deborah B, MD   2 years ago Pap smear for cervical cancer screening   [place], MD       Future Appointments             In 1 month Wynetta Emery Dalbert Batman, MD [place] - Patient  is not pregnant

## 2021-07-30 ENCOUNTER — Telehealth: Payer: Self-pay

## 2021-07-30 NOTE — Telephone Encounter (Signed)
Received faxed refill request for patient's Biktarvy. Called [facility] to deny prescription as patient has not been seen since 04/2020. Asked pharmacist to please have patient call our office to schedule appointment if they're able to contact her.   Beryle Flock, RN

## 2021-08-09 ENCOUNTER — Telehealth: Payer: Self-pay | Admitting: *Deleted

## 2021-08-09 ENCOUNTER — Ambulatory Visit: Payer: Commercial Managed Care - HMO | Admitting: Gastroenterology

## 2021-08-09 NOTE — Telephone Encounter (Signed)
Called patient to reschedule her appointment for today. Unable to leave a message as her mailbox is full.

## 2021-08-16 ENCOUNTER — Other Ambulatory Visit (HOSPITAL_COMMUNITY): Payer: Self-pay

## 2021-08-22 ENCOUNTER — Other Ambulatory Visit (HOSPITAL_COMMUNITY): Payer: Self-pay

## 2021-08-24 ENCOUNTER — Other Ambulatory Visit (HOSPITAL_COMMUNITY): Payer: Self-pay

## 2021-09-05 ENCOUNTER — Other Ambulatory Visit (HOSPITAL_COMMUNITY): Payer: Self-pay

## 2021-09-05 ENCOUNTER — Other Ambulatory Visit: Payer: Self-pay | Admitting: Internal Medicine

## 2021-09-05 DIAGNOSIS — I10 Essential (primary) hypertension: Secondary | ICD-10-CM

## 2021-09-05 MED ORDER — AMLODIPINE BESYLATE 10 MG PO TABS
10.0000 mg | ORAL_TABLET | Freq: Every day | ORAL | 0 refills | Status: DC
  Filled 2021-09-05: qty 30, 30d supply, fill #0

## 2021-09-06 ENCOUNTER — Other Ambulatory Visit (HOSPITAL_COMMUNITY): Payer: Self-pay

## 2021-09-11 ENCOUNTER — Other Ambulatory Visit (HOSPITAL_COMMUNITY): Payer: Self-pay

## 2021-09-12 DIAGNOSIS — Z20822 Contact with and (suspected) exposure to covid-19: Secondary | ICD-10-CM | POA: Diagnosis not present

## 2021-09-13 ENCOUNTER — Ambulatory Visit: Payer: Medicare Other | Admitting: Pharmacist

## 2021-09-13 ENCOUNTER — Ambulatory Visit: Payer: Commercial Managed Care - HMO | Admitting: Internal Medicine

## 2021-09-13 ENCOUNTER — Other Ambulatory Visit (HOSPITAL_COMMUNITY): Payer: Self-pay

## 2021-09-14 ENCOUNTER — Other Ambulatory Visit (HOSPITAL_COMMUNITY): Payer: Self-pay

## 2021-09-17 ENCOUNTER — Other Ambulatory Visit (HOSPITAL_COMMUNITY): Payer: Self-pay

## 2021-09-17 MED ORDER — SERTRALINE HCL 50 MG PO TABS
ORAL_TABLET | ORAL | 0 refills | Status: DC
  Filled 2021-09-17: qty 30, 30d supply, fill #0

## 2021-10-09 ENCOUNTER — Ambulatory Visit: Payer: Medicare Other | Admitting: Gastroenterology

## 2021-10-11 ENCOUNTER — Telehealth: Payer: Self-pay | Admitting: Gastroenterology

## 2021-10-11 NOTE — Telephone Encounter (Signed)
Patient dismissed from [facility] and all providers practicing at this clinic 10/09/21 ?

## 2021-10-24 ENCOUNTER — Ambulatory Visit: Payer: Medicare Other | Admitting: Infectious Disease

## 2021-10-24 ENCOUNTER — Ambulatory Visit: Payer: Medicare Other | Admitting: Physician Assistant

## 2021-10-25 ENCOUNTER — Other Ambulatory Visit (HOSPITAL_COMMUNITY): Payer: Self-pay

## 2021-10-25 ENCOUNTER — Other Ambulatory Visit: Payer: Self-pay | Admitting: Gastroenterology

## 2021-10-25 ENCOUNTER — Other Ambulatory Visit: Payer: Self-pay | Admitting: Internal Medicine

## 2021-10-25 DIAGNOSIS — I10 Essential (primary) hypertension: Secondary | ICD-10-CM

## 2021-10-25 MED ORDER — AMLODIPINE BESYLATE 10 MG PO TABS
10.0000 mg | ORAL_TABLET | Freq: Every day | ORAL | 0 refills | Status: DC
  Filled 2021-10-25: qty 30, 30d supply, fill #0

## 2021-10-25 MED ORDER — BUDESONIDE 3 MG PO CPEP
9.0000 mg | ORAL_CAPSULE | Freq: Every day | ORAL | 0 refills | Status: AC
Start: 2021-10-25 — End: ?
  Filled 2021-10-25: qty 90, 30d supply, fill #0

## 2021-10-25 NOTE — Telephone Encounter (Signed)
Can I refill this 1 more time? Patient was discharged.  ?

## 2021-11-13 ENCOUNTER — Other Ambulatory Visit (HOSPITAL_COMMUNITY): Payer: Self-pay

## 2021-11-13 ENCOUNTER — Ambulatory Visit: Payer: Medicare Other | Admitting: Infectious Disease

## 2021-11-14 ENCOUNTER — Ambulatory Visit: Payer: Medicare Other | Admitting: Physician Assistant

## 2021-11-19 ENCOUNTER — Ambulatory Visit: Payer: Medicare Other | Admitting: Infectious Disease

## 2021-11-23 ENCOUNTER — Other Ambulatory Visit: Payer: Self-pay | Admitting: Pharmacist

## 2021-11-23 DIAGNOSIS — I10 Essential (primary) hypertension: Secondary | ICD-10-CM

## 2021-11-23 MED ORDER — LISINOPRIL 5 MG PO TABS: 5.0000 mg | ORAL_TABLET | Freq: Every day | ORAL | 0 refills | Status: DC

## 2021-12-08 ENCOUNTER — Other Ambulatory Visit: Payer: Self-pay | Admitting: Internal Medicine

## 2021-12-08 DIAGNOSIS — I10 Essential (primary) hypertension: Secondary | ICD-10-CM

## 2021-12-10 ENCOUNTER — Other Ambulatory Visit (HOSPITAL_COMMUNITY): Payer: Self-pay

## 2021-12-10 ENCOUNTER — Ambulatory Visit: Payer: Medicare Other | Attending: Internal Medicine | Admitting: Internal Medicine

## 2021-12-10 ENCOUNTER — Encounter: Payer: Self-pay | Admitting: Internal Medicine

## 2021-12-10 VITALS — BP 148/84 | HR 75 | Temp 98.3°F | Resp 16 | Wt 113.0 lb

## 2021-12-10 DIAGNOSIS — E441 Mild protein-calorie malnutrition: Secondary | ICD-10-CM

## 2021-12-10 DIAGNOSIS — R87619 Unspecified abnormal cytological findings in specimens from cervix uteri: Secondary | ICD-10-CM

## 2021-12-10 DIAGNOSIS — B20 Human immunodeficiency virus [HIV] disease: Secondary | ICD-10-CM

## 2021-12-10 DIAGNOSIS — F325 Major depressive disorder, single episode, in full remission: Secondary | ICD-10-CM | POA: Insufficient documentation

## 2021-12-10 DIAGNOSIS — K5100 Ulcerative (chronic) pancolitis without complications: Secondary | ICD-10-CM | POA: Diagnosis not present

## 2021-12-10 DIAGNOSIS — F321 Major depressive disorder, single episode, moderate: Secondary | ICD-10-CM | POA: Diagnosis not present

## 2021-12-10 DIAGNOSIS — F1990 Other psychoactive substance use, unspecified, uncomplicated: Secondary | ICD-10-CM

## 2021-12-10 DIAGNOSIS — R21 Rash and other nonspecific skin eruption: Secondary | ICD-10-CM | POA: Diagnosis not present

## 2021-12-10 DIAGNOSIS — I10 Essential (primary) hypertension: Secondary | ICD-10-CM | POA: Diagnosis not present

## 2021-12-10 DIAGNOSIS — K743 Primary biliary cirrhosis: Secondary | ICD-10-CM

## 2021-12-10 MED ORDER — AMLODIPINE BESYLATE 10 MG PO TABS
10.0000 mg | ORAL_TABLET | Freq: Every day | ORAL | 3 refills | Status: AC
  Filled 2021-12-10: qty 30, 30d supply, fill #0
  Filled 2022-04-16 – 2022-04-17 (×2): qty 30, 30d supply, fill #1

## 2021-12-10 MED ORDER — TRIAMCINOLONE ACETONIDE 0.1 % EX CREA
1.0000 | TOPICAL_CREAM | Freq: Two times a day (BID) | CUTANEOUS | 0 refills | Status: AC
  Filled 2021-12-10: qty 30, 15d supply, fill #0

## 2021-12-10 MED ORDER — ESCITALOPRAM OXALATE 10 MG PO TABS
10.0000 mg | ORAL_TABLET | Freq: Every day | ORAL | 3 refills | Status: AC
Start: 2021-12-10 — End: ?
  Filled 2021-12-10: qty 30, 30d supply, fill #0
  Filled 2022-02-25: qty 30, 30d supply, fill #1
  Filled 2022-04-16: qty 30, 30d supply, fill #2

## 2021-12-10 MED ORDER — LISINOPRIL 5 MG PO TABS
5.0000 mg | ORAL_TABLET | Freq: Every day | ORAL | 4 refills | Status: AC
  Filled 2021-12-10: qty 30, 30d supply, fill #0
  Filled 2022-02-25: qty 30, 30d supply, fill #1
  Filled 2022-04-16: qty 30, 30d supply, fill #2

## 2021-12-10 MED ORDER — ATORVASTATIN CALCIUM 10 MG PO TABS
10.0000 mg | ORAL_TABLET | Freq: Every day | ORAL | 3 refills | Status: DC
  Filled 2021-12-10: qty 30, 30d supply, fill #0

## 2021-12-10 NOTE — Progress Notes (Signed)
Patient ID: [name] [name], female    DOB: [birth year]  MRN: [id number]  CC: Hypertension   Subjective: [name] [name] is a 61 y.o. female who presents for chronic ds management.  Last seen 05/2020. Her concerns today include:  Patient with history of HTN, HL, IDA, GERD, LGSIL with positive HPV (neg colpo 07/10/2017), former tobacco, HIV, ulcerative colitis and calf cramps  Pt tells me she was in [facility] for 3 mths visiting family. Complains of itching especially on her arms which she attributes to bites from bedbugs that she sustained while she was visiting family.  Did not bring meds with her today but tells me she is taking everything HTN: out of Norvasc for over a wk.  Took Lisinopril today.  Limits salt.  No CP/SOB/LE edema/HA  PBC and UC:  no diarrhea or blood in stools.  Reports compliance with Entocort, SA and Ursodiol.  Reports Rowasa enema was d/c by GI Has f/u appt with Dr. Orvan Falconer next mth.  However I do not see any appointments in the system. Referred to [facility] by Dr. Orvan Falconer for nonhealing gastric ulcers.  Reports she was seen and also has f/u appt there.  Denies any abdominal pain.  No blood in the stools.  Admitted to [facility] for psychiatric admission 08/2021 for MDD, polysubst abuse - UDS positive for  cocaine and THC Reports she was living with others who were using cocaine but she was not using Goes to NA meetings 3 days a wk Not plugged in with [facility] Reports taking Lexapro, helpful.  Requests refills. No SI.   HL:  reports compliance with Lipitor.   Hx of abn PAP:   Had  colpo by Dr. Alysia Penna 04/2019 due to Pap showing LGSIL with HPV.  This colpo revealed LGSIL.  He spoke with GYN/Onc.  They recommend repeat Pap smear with HPV in 1 year.  She is overdue.  HIV: no-showed appt with Daiva Eves early this mth.  Has appt this Friday.  Reports compliance with taking Biktarvy.   Patient Active Problem List   Diagnosis Date Noted   Depression, major, in remission (HCC)  12/10/2021   Hepatitis C 03/01/2021   Hx of substance abuse (HCC) 03/01/2021   Ulcerative colitis with rectal bleeding (HCC) 11/13/2020   Aortic atherosclerosis (HCC) 11/13/2020   Colitis 11/13/2020   Depression 05/04/2020   Grieving 05/04/2020   Chronic gastric ulcer with hemorrhage    Peptic ulcer disease 06/28/2019   History of colposcopy with cervical biopsy 06/28/2019   Primary biliary cirrhosis (HCC) 07/03/2018   LGSIL Pap smear of vagina 06/13/2017   Immunization due 04/07/2017   Calf cramp 12/17/2016   HIV disease (HCC) 02/02/2015   Current smoker 10/27/2014   Hyperlipidemia 08/24/2014   Vitamin D deficiency 08/24/2014   S/P hysterectomy 08/23/2014   Underweight 08/23/2014   Ulcerative colitis (HCC) 05/27/2014   HTN (hypertension) 05/27/2014   Anemia of chronic disease 05/27/2014   Insomnia 05/27/2014   Dental caries 05/10/2014   Cavitary lesion of lung 04/25/2014     Current Outpatient Medications on File Prior to Visit  Medication Sig Dispense Refill   bictegravir-emtricitabine-tenofovir AF (BIKTARVY) 50-200-25 MG TABS tablet TAKE 1 TABLET BY MOUTH DAILY. 30 tablet 0   budesonide (ENTOCORT EC) 3 MG 24 hr capsule Take 3 capsules (9 mg total) by mouth daily. 90 capsule 0   budesonide (ENTOCORT EC) 3 MG 24 hr capsule Take 3 capsules (9 mg total) by mouth daily. 90 capsule 0  famotidine (PEPCID) 20 MG tablet TAKE 1 TABLET BY MOUTH  TWICE DAILY (Patient taking differently: Take 20 mg by mouth 2 (two) times daily.) 180 tablet 3   FEROSUL 325 (65 Fe) MG tablet TAKE 1 TABLET BY MOUTH DAILY WITH BREAKFAST. (Patient taking differently: Take 325 mg by mouth daily with breakfast.) 90 tablet 3   mesalamine (ROWASA) 4 g enema Insert enema rectally twice daily for 2 weeks, then reduce to once every day at bedtime. 3600 mL 3   Multiple Vitamins-Minerals (MULTIVITAMIN WITH MINERALS) tablet Take 1 tablet by mouth daily.     pantoprazole (PROTONIX) 40 MG tablet TAKE 1 TABLET BY MOUTH   DAILY (Patient taking differently: Take 40 mg by mouth daily.) 90 tablet 3   sulfaSALAzine (AZULFIDINE) 500 MG tablet TAKE 2 TABLETS BY MOUTH  TWICE DAILY 360 tablet 3   ursodiol (ACTIGALL) 250 MG tablet TAKE 1 TABLET BY MOUTH  TWICE DAILY (Patient taking differently: Take 250 mg by mouth 2 (two) times daily.) 180 tablet 3   No current facility-administered medications on file prior to visit.    No Known Allergies  Social History   Socioeconomic History   Marital status: Single    Spouse name: Not on file   Number of children: Not on file   Years of education: Not on file   Highest education level: Not on file  Occupational History   Not on file  Tobacco Use   Smoking status: Former    Packs/day: 0.20    Years: 35.00    Total pack years: 7.00    Types: Cigarettes   Smokeless tobacco: Never  Vaping Use   Vaping Use: Never used  Substance and Sexual Activity   Alcohol use: No    Alcohol/week: 0.0 standard drinks of alcohol   Drug use: Yes    Types: Marijuana    Comment: previous cocaine use, marijuana on 12/29   Sexual activity: Not Currently    Birth control/protection: Other-see comments, Surgical    Comment: offered condoms; 03/2019  Other Topics Concern   Not on file  Social History Narrative   Not on file   Social Determinants of Health   Financial Resource Strain: Low Risk  (03/01/2021)   Overall Financial Resource Strain (CARDIA)    Difficulty of Paying Living Expenses: Not hard at all  Food Insecurity: No Food Insecurity (03/01/2021)   Hunger Vital Sign    Worried About Running Out of Food in the Last Year: Never true    Ran Out of Food in the Last Year: Never true  Transportation Needs: No Transportation Needs (03/01/2021)   PRAPARE - Administrator, Civil Service (Medical): No    Lack of Transportation (Non-Medical): No  Physical Activity: Sufficiently Active (03/01/2021)   Exercise Vital Sign    Days of Exercise per Week: 7 days    Minutes of  Exercise per Session: 30 min  Stress: No Stress Concern Present (03/01/2021)   [employer] of Occupational Health - Occupational Stress Questionnaire    Feeling of Stress : Not at all  Social Connections: Moderately Integrated (03/01/2021)   Social Connection and Isolation Panel [NHANES]    Frequency of Communication with Friends and Family: More than three times a week    Frequency of Social Gatherings with Friends and Family: More than three times a week    Attends Religious Services: More than 4 times per year    Active Member of [employer] or Organizations: Yes  Attends Banker Meetings: More than 4 times per year    Marital Status: Widowed  Intimate Partner Violence: Not At Risk (03/01/2021)   Humiliation, Afraid, Rape, and Kick questionnaire    Fear of Current or Ex-Partner: No    Emotionally Abused: No    Physically Abused: No    Sexually Abused: No    Family History  Problem Relation Age of Onset   Diabetes Mother    Stroke Mother    Cancer Father        ?   Alcoholism Father    Pancreatitis Sister    Diabetes Sister    Prostate cancer Brother    Colon cancer Paternal Uncle 81   Bipolar disorder Daughter    Esophageal cancer Neg Hx    Rectal cancer Neg Hx    Stomach cancer Neg Hx    Colon polyps Neg Hx     Past Surgical History:  Procedure Laterality Date   ABDOMINAL HYSTERECTOMY  2001   done in [facility], patient unsure of indication   BIOPSY  04/27/2020   Procedure: BIOPSY;  Surgeon: Tressia Danas, MD;  Location: [facility];  Service: Gastroenterology;;   BIOPSY  06/15/2020   Procedure: BIOPSY;  Surgeon: Rachael Fee, MD;  Location: [facility];  Service: Gastroenterology;;   COLONOSCOPY  2020   KB-MAC-suprep (adeq)   ESOPHAGOGASTRODUODENOSCOPY     ESOPHAGOGASTRODUODENOSCOPY (EGD) WITH PROPOFOL N/A 04/27/2020   Procedure: ESOPHAGOGASTRODUODENOSCOPY (EGD) WITH PROPOFOL;  Surgeon: Tressia Danas, MD;  Location: [facility]  ENDOSCOPY;  Service: Gastroenterology;  Laterality: N/A;   ESOPHAGOGASTRODUODENOSCOPY (EGD) WITH PROPOFOL N/A 06/15/2020   Procedure: ESOPHAGOGASTRODUODENOSCOPY (EGD) WITH PROPOFOL;  Surgeon: Rachael Fee, MD;  Location: [facility];  Service: Gastroenterology;  Laterality: N/A;   TEE WITHOUT CARDIOVERSION N/A 05/02/2014   Procedure: TRANSESOPHAGEAL ECHOCARDIOGRAM (TEE);  Surgeon: Laurey Morale, MD;  Location: [facility] ENDOSCOPY;  Service: Cardiovascular;  Laterality: N/A;   UPPER ESOPHAGEAL ENDOSCOPIC ULTRASOUND (EUS) N/A 06/15/2020   Procedure: UPPER ESOPHAGEAL ENDOSCOPIC ULTRASOUND (EUS);  Surgeon: Rachael Fee, MD;  Location: [facility];  Service: Gastroenterology;  Laterality: N/A;   VIDEO BRONCHOSCOPY Bilateral 04/27/2014   Procedure: VIDEO BRONCHOSCOPY WITH FLUORO;  Surgeon: Oretha Milch, MD;  Location: [facility] ENDOSCOPY;  Service: Cardiopulmonary;  Laterality: Bilateral;    ROS: Review of Systems Negative except as stated above  PHYSICAL EXAM: BP (!) 148/84 (BP Location: Left Arm, Patient Position: Sitting, Cuff Size: Normal)   Pulse 75   Temp 98.3 F (36.8 C) (Oral)   Resp 16   Wt 113 lb (51.3 kg)   SpO2 97%   BMI 17.18 kg/m   Wt Readings from Last 3 Encounters:  12/10/21 113 lb (51.3 kg)  05/02/21 125 lb (56.7 kg)  04/25/21 125 lb (56.7 kg)    Physical Exam  General appearance -older African-American female who appears underweight for height with some temporal wasting. Mental status - normal mood, behavior, speech, dress, motor activity, and thought processes Mouth - mucous membranes moist, pharynx normal without lesions Neck - supple, no significant adenopathy Chest - clear to auscultation, no wheezes, rales or rhonchi, symmetric air entry Heart - normal rate, regular rhythm, normal S1, S2, no murmurs, rubs, clicks or gallops Extremities - peripheral pulses normal, no pedal edema, no clubbing or cyanosis Skin -excoriated markings on both forearms left worse than  right.     03/01/2021    5:42 PM 03/01/2021    5:41 PM 03/01/2021    3:35 PM  Depression screen PHQ 2/9  Decreased Interest 0 0 0  Down, Depressed, Hopeless 0 0 0  PHQ - 2 Score 0 0 0       Latest Ref Rng & Units 01/03/2021    9:40 AM 11/14/2020    1:04 AM 11/13/2020    5:00 AM  CMP  Glucose 70 - 99 mg/dL 409  811  914   BUN 6 - 23 mg/dL 19  9  6    Creatinine 0.40 - 1.20 mg/dL 7.82  9.56  2.13   Sodium 135 - 145 mEq/L 141  134  138   Potassium 3.5 - 5.1 mEq/L 3.9  4.6  3.7   Chloride 96 - 112 mEq/L 106  107  111   CO2 19 - 32 mEq/L 28  22  23    Calcium 8.4 - 10.5 mg/dL 9.5  8.3  8.5   Total Protein 6.0 - 8.3 g/dL 7.5   6.9   Total Bilirubin 0.2 - 1.2 mg/dL 0.5   0.6   Alkaline Phos 39 - 117 U/L 135   204   [name] 0 - 37 U/L 21   33   ALT 0 - 35 U/L 15   21    Lipid Panel     Component Value Date/Time   CHOL 223 (H) 04/20/2020 1042   TRIG 94 04/20/2020 1042   HDL 66 04/20/2020 1042   CHOLHDL 3.4 04/20/2020 1042   VLDL 16 10/28/2016 1006   LDLCALC 137 (H) 04/20/2020 1042    CBC    Component Value Date/Time   WBC 7.6 01/03/2021 0940   RBC 2.80 (L) 01/03/2021 0940   HGB 8.8 Repeated and verified X2. (L) 01/03/2021 0940   HGB 11.8 11/07/2017 1002   HCT 26.0 Repeated and verified X2. (L) 01/03/2021 0940   HCT 34.7 11/07/2017 1002   PLT 513.0 (H) 01/03/2021 0940   PLT 350 11/07/2017 1002   MCV 92.8 01/03/2021 0940   MCV 93 11/07/2017 1002   MCH 30.2 11/14/2020 0104   MCHC 34.0 01/03/2021 0940   RDW 15.8 (H) 01/03/2021 0940   RDW 16.6 (H) 11/07/2017 1002   LYMPHSABS 3.4 11/12/2020 1752   MONOABS 0.6 11/12/2020 1752   EOSABS 0.0 11/12/2020 1752   BASOSABS 0.0 11/12/2020 1752    ASSESSMENT AND PLAN:  1. Essential hypertension Not at goal.  She has been out of lisinopril.  Refill given on lisinopril and amlodipine.  Encourage compliance. - amLODipine (NORVASC) 10 MG tablet; Take 1 tablet (10 mg total) by mouth daily.  Dispense: 30 tablet; Refill: 3 - lisinopril  (ZESTRIL) 5 MG tablet; Take 1 tablet (5 mg total) by mouth daily.  Dispense: 30 tablet; Refill: 4  2. Substance use disorder Strongly encouraged her to seek help.  She is in denial about use stating that she was around people who was using but she herself was not using.  Currently going to NA meetings.  3. Moderate major depression (HCC) Continue Lexapro.  She is agreeable to being plugged into behavioral health services. - Ambulatory referral to Psychiatry  4. Human immunodeficiency virus (HIV) disease (HCC) Strongly encourage compliance with Biktarvy and to keep her upcoming appointment with ID.  5. Primary biliary cirrhosis (HCC) We will get her back in with Dr. Orvan Falconer for follow-up.  Currently on Ursodiol - Ambulatory referral to Gastroenterology  6. Ulcerative (chronic) pancolitis without complications (HCC) - Ambulatory referral to Gastroenterology  7. Rash - triamcinolone cream (KENALOG) 0.1 %; Apply 1 application topically  2 (two) times daily.  Dispense: 30 g; Refill: 0  8. Abnormal cervical Papanicolaou smear, unspecified abnormal pap finding Refer for follow-up with GYN  9.  Mild protein malnutrition -Encouraged her to stay away from street drugs. Try to eat 3 meals a day. Stressed compliance with [employer]. Labs [facility] on [facility].  CBC was normal.  Patient was given the opportunity to ask questions.  Patient verbalized understanding of the plan and was able to repeat key elements of the plan.   This documentation was completed using Paediatric nurse.  Any transcriptional errors are unintentional.  Orders Placed This Encounter  Procedures   Ambulatory referral to Gastroenterology   Ambulatory referral to Psychiatry     Requested Prescriptions   Signed Prescriptions Disp Refills   amLODipine (NORVASC) 10 MG tablet 30 tablet 3    Sig: Take 1 tablet (10 mg total) by mouth daily.    atorvastatin (LIPITOR) 10 MG tablet 30 tablet 3    Sig: Take 1 tablet (10 mg total) by mouth daily.   escitalopram (LEXAPRO) 10 MG tablet 30 tablet 3    Sig: Take 1 tablet (10 mg total) by mouth daily.   lisinopril (ZESTRIL) 5 MG tablet 30 tablet 4    Sig: Take 1 tablet (5 mg total) by mouth daily.   triamcinolone cream (KENALOG) 0.1 % 30 g 0    Sig: Apply 1 application topically 2 (two) times daily.    Return in about 1 month (around 01/09/2022) for for Medicare Wellness Visit.  Jonah Blue, MD, FACP

## 2021-12-14 ENCOUNTER — Ambulatory Visit: Payer: Medicare Other | Admitting: Pharmacist

## 2021-12-17 ENCOUNTER — Other Ambulatory Visit (HOSPITAL_COMMUNITY): Payer: Self-pay

## 2021-12-21 ENCOUNTER — Telehealth: Payer: Self-pay | Admitting: Internal Medicine

## 2021-12-21 NOTE — Telephone Encounter (Signed)
Has patient seen PCP for this complaint? Yes     *If NO, is insurance requiring patient see PCP for this issue before PCP can refer them?    Referral for which specialty: Gertie Fey     Preferred provider/office:     Reason for referral: Pt was with [facility] but she is not longer with then due to missing appts / pt needs a new referral to another [facility] provider / please advise asap

## 2021-12-24 ENCOUNTER — Other Ambulatory Visit: Payer: Self-pay | Admitting: Pharmacist

## 2021-12-24 DIAGNOSIS — B20 Human immunodeficiency virus [HIV] disease: Secondary | ICD-10-CM

## 2021-12-24 MED ORDER — BIKTARVY 50-200-25 MG PO TABS: 1.0000 | ORAL_TABLET | Freq: Every day | ORAL | 0 refills | Status: AC

## 2021-12-24 NOTE — Telephone Encounter (Signed)
Pt is needing new referral due to her no show rate.

## 2021-12-24 NOTE — Progress Notes (Signed)
Medication Samples have been provided to the patient.  Drug name: Biktarvy        Strength: 50/200/25 mg       Qty: 7 tablets (1 bottle)   LOT: [id number]   Exp.Date: 02/2024  Dosing instructions: Take one tablet by mouth once daily  The patient has been instructed regarding the correct time, dose, and frequency of taking this medication, including desired effects and most common side effects.   Deneisha Dade L. Eber Hong, PharmD, BCIDP, AAHIVP, CPP Clinical Pharmacist Practitioner Infectious Diseases [place] [facility] 05/29/2020, 10:07 AM.

## 2021-12-26 ENCOUNTER — Other Ambulatory Visit: Payer: Self-pay | Admitting: Internal Medicine

## 2021-12-26 NOTE — Telephone Encounter (Signed)
Pt was called and message was left informing patient to return phone call.

## 2022-01-07 ENCOUNTER — Telehealth: Payer: Self-pay | Admitting: Pharmacist

## 2022-01-07 NOTE — Telephone Encounter (Signed)
Patient attempted to be outreached by Park Liter, PharmD Candidate on 01/07/22 to discuss hypertension. Call could not be completed with listed mobile number.   Catie Hedwig Morton, PharmD, [facility]  [phone]

## 2022-01-10 ENCOUNTER — Other Ambulatory Visit: Payer: Self-pay

## 2022-01-10 ENCOUNTER — Encounter: Payer: Self-pay | Admitting: Internal Medicine

## 2022-01-10 ENCOUNTER — Ambulatory Visit: Payer: Medicare Other | Attending: Internal Medicine | Admitting: Internal Medicine

## 2022-01-10 ENCOUNTER — Ambulatory Visit (INDEPENDENT_AMBULATORY_CARE_PROVIDER_SITE_OTHER): Payer: Medicare Other | Admitting: Pharmacist

## 2022-01-10 ENCOUNTER — Other Ambulatory Visit (HOSPITAL_COMMUNITY): Payer: Self-pay

## 2022-01-10 VITALS — BP 151/92 | Temp 98.1°F | Ht 69.0 in | Wt 106.4 lb

## 2022-01-10 DIAGNOSIS — G4700 Insomnia, unspecified: Secondary | ICD-10-CM

## 2022-01-10 DIAGNOSIS — Z0001 Encounter for general adult medical examination with abnormal findings: Secondary | ICD-10-CM

## 2022-01-10 DIAGNOSIS — Z7189 Other specified counseling: Secondary | ICD-10-CM

## 2022-01-10 DIAGNOSIS — B20 Human immunodeficiency virus [HIV] disease: Secondary | ICD-10-CM

## 2022-01-10 DIAGNOSIS — K529 Noninfective gastroenteritis and colitis, unspecified: Secondary | ICD-10-CM | POA: Diagnosis not present

## 2022-01-10 DIAGNOSIS — I10 Essential (primary) hypertension: Secondary | ICD-10-CM

## 2022-01-10 DIAGNOSIS — R634 Abnormal weight loss: Secondary | ICD-10-CM | POA: Diagnosis not present

## 2022-01-10 DIAGNOSIS — R011 Cardiac murmur, unspecified: Secondary | ICD-10-CM

## 2022-01-10 DIAGNOSIS — Z1231 Encounter for screening mammogram for malignant neoplasm of breast: Secondary | ICD-10-CM

## 2022-01-10 DIAGNOSIS — Z0000 Encounter for general adult medical examination without abnormal findings: Secondary | ICD-10-CM

## 2022-01-10 DIAGNOSIS — Z23 Encounter for immunization: Secondary | ICD-10-CM

## 2022-01-10 DIAGNOSIS — K029 Dental caries, unspecified: Secondary | ICD-10-CM

## 2022-01-10 DIAGNOSIS — Z79899 Other long term (current) drug therapy: Secondary | ICD-10-CM | POA: Diagnosis not present

## 2022-01-10 MED ORDER — ZOSTER VAC RECOMB ADJUVANTED 50 MCG/0.5ML IM SUSR: 0.5000 mL | Freq: Once | INTRAMUSCULAR | 0 refills | Status: AC

## 2022-01-10 MED ORDER — BICTEGRAVIR-EMTRICITAB-TENOFOV 50-200-25 MG PO TABS
1.0000 | ORAL_TABLET | Freq: Every day | ORAL | 2 refills | Status: AC
  Filled 2022-01-10 – 2022-02-25 (×2): qty 30, 30d supply, fill #0
  Filled 2022-04-16: qty 30, 30d supply, fill #1

## 2022-01-10 NOTE — Progress Notes (Signed)
Subjective:   [name] [name] is a 61 y.o. female who presents for an Initial Medicare Annual Wellness Visit. Patient with history of HTN, HL, IDA, GERD, LGSIL with positive HPV (neg colpo 07/10/2017), former tobacco, HIV, ulcerative colitis and calf cramps, polysubstance abuse (psychiatric admission 08/2021 at [facility])  Review of Systems    GI: Patient complains of abdominal cramping and diarrhea that started yesterday evening.  Loose stools every 30 minutes last night.  She has had 3 soft stools this morning.  No blood in the stools.  Denies any fever.  Had 1 episode of vomiting before coming in this morning.  She had just taken her medications including blood pressure pills and all were regurgitated.  Denies any recent sick contacts.  Yesterday she was eating a lot of fruits including strawberries, watermelon, peaches and bananas before her symptoms began.  She has been drinking water and Gatorade. -Referred for follow-up with her gastroenterologist Dr. Tarri Glenn on last visit for known history of ulcerative colitis and PBS.  However she has been dismissed from Dr. Tarri Glenn practice.  She tells me she has an upcoming appointment with Dr. Cristina Gong with [facility]. -I note that she has had ongoing weight loss.  Down 7 pounds since I saw her 1 month ago.  Down 19 pounds since November of last year.  She has been supplementing meals with Ensure 3 times a day for the past few weeks.  GU: Referred back to gynecology on last visit for her repeat Pap smear.  She has not been called this yet.  Psy: Complains of problems sleeping.  Gets in bed around 9 PM but unable to fall asleep until after 11.  Keeps the TV on for about 30 minutes after she gets in bed.  She does drink a 16 ounce bottle of Coca-Cola every day around 6 to 7 PM.  Once she falls asleep she is able to stay asleep.  ID: Has an appointment later this morning with Dr. Tommy Medal for follow-up on HIV.    Objective:    Today's Vitals    01/10/22 1010  BP: (!) 151/92  Temp: 98.1 F (36.7 C)  TempSrc: Oral  SpO2: 97%  Weight: 106 lb 6.4 oz (48.3 kg)  Height: _0  (1.753 m)  PainSc: 8    Body mass index is 15.71 kg/m. Wt Readings from Last 3 Encounters:  01/10/22 106 lb 6.4 oz (48.3 kg)  12/10/21 113 lb (51.3 kg)  05/02/21 125 lb (56.7 kg)   General: Patient appears underweight. Neck: No cervical lymphadenopathy Chest: Clear to auscultation bilaterally CVS: 3/6 systolic ejection murmur heard along the left sternal border and at the apex.  Occasional ectopy Mouth: Poor dental hygiene with plaque buildup and numerous rotted teeth broken off in the gum Abdomen: No abdominal masses felt. Skin: Rash that was seen on last visit on the forearms have dried up but has left skin discoloration    01/10/2022   10:14 AM 03/01/2021    5:42 PM 03/01/2021    3:38 PM 01/11/2021   10:25 AM 06/15/2020    7:24 AM 04/27/2020    9:08 AM 03/15/2019   11:22 AM  Advanced Directives  Does Patient Have a Medical Advance Directive? _1  No No  Would patient like information on creating a medical advance directive? Yes (ED - Information included in AVS) No - Patient declined No - Patient declined Yes (MAU/Ambulatory/Procedural Areas - Information given) Yes (MAU/Ambulatory/Procedural Areas - Information given)  Yes (MAU/Ambulatory/Procedural Areas - Information given) No - Patient declined    Current Medications (verified) Outpatient Encounter Medications as of 01/10/2022  Medication Sig   amLODipine (NORVASC) 10 MG tablet Take 1 tablet (10 mg total) by mouth daily.   atorvastatin (LIPITOR) 10 MG tablet TAKE 1 TABLET BY MOUTH  DAILY   bictegravir-emtricitabine-tenofovir AF (BIKTARVY) 50-200-25 MG TABS tablet TAKE 1 TABLET BY MOUTH DAILY.   bictegravir-emtricitabine-tenofovir AF (BIKTARVY) 50-200-25 MG TABS tablet Take 1 tablet by mouth daily.   budesonide (ENTOCORT EC) 3 MG 24 hr capsule Take 3 capsules (9 mg total) by mouth daily.    budesonide (ENTOCORT EC) 3 MG 24 hr capsule Take 3 capsules (9 mg total) by mouth daily.   escitalopram (LEXAPRO) 10 MG tablet Take 1 tablet (10 mg total) by mouth daily.   famotidine (PEPCID) 20 MG tablet TAKE 1 TABLET BY MOUTH  TWICE DAILY (Patient taking differently: Take 20 mg by mouth 2 (two) times daily.)   FEROSUL 325 (65 Fe) MG tablet TAKE 1 TABLET BY MOUTH DAILY WITH BREAKFAST. (Patient taking differently: Take 325 mg by mouth daily with breakfast.)   lisinopril (ZESTRIL) 5 MG tablet Take 1 tablet (5 mg total) by mouth daily.   Multiple Vitamins-Minerals (MULTIVITAMIN WITH MINERALS) tablet Take 1 tablet by mouth daily.   pantoprazole (PROTONIX) 40 MG tablet TAKE 1 TABLET BY MOUTH  DAILY (Patient taking differently: Take 40 mg by mouth daily.)   sulfaSALAzine (AZULFIDINE) 500 MG tablet TAKE 2 TABLETS BY MOUTH  TWICE DAILY   triamcinolone cream (KENALOG) 0.1 % Apply 1 application topically 2 (two) times daily.   ursodiol (ACTIGALL) 250 MG tablet TAKE 1 TABLET BY MOUTH  TWICE DAILY (Patient taking differently: Take 250 mg by mouth 2 (two) times daily.)   mesalamine (ROWASA) 4 g enema Insert enema rectally twice daily for 2 weeks, then reduce to once every day at bedtime. (Patient not taking: Reported on 01/10/2022)   No facility-administered encounter medications on file as of 01/10/2022.    Allergies (verified) Patient has no known allergies.   History: Past Medical History:  Diagnosis Date   Cavities 02/02/2015   Chronic gastric ulcer    Depression    hx of- no meds at this time   GERD (gastroesophageal reflux disease) Dx 2014   on meds   GI bleed    Grieving 05/04/2020   HIV (human immunodeficiency virus infection) ([place]) Dx 2015   on meds   Hyperlipidemia    on meds   Hypertension Dx 2014   on meds   Primary biliary cirrhosis ([place]) 07/03/2018   Substance abuse ([place])    Quit 2008   Ulcerative colitis [facility])    Past Surgical History:  Procedure Laterality Date    ABDOMINAL HYSTERECTOMY  2001   done in [facility], patient unsure of indication   BIOPSY  04/27/2020   Procedure: BIOPSY;  Surgeon: Thornton Park, MD;  Location: [facility];  Service: Gastroenterology;;   BIOPSY  06/15/2020   Procedure: BIOPSY;  Surgeon: Milus Banister, MD;  Location: [facility];  Service: Gastroenterology;;   COLONOSCOPY  2020   KB-MAC-suprep (adeq)   ESOPHAGOGASTRODUODENOSCOPY     ESOPHAGOGASTRODUODENOSCOPY (EGD) WITH PROPOFOL N/A 04/27/2020   Procedure: ESOPHAGOGASTRODUODENOSCOPY (EGD) WITH PROPOFOL;  Surgeon: Thornton Park, MD;  Location: [facility];  Service: Gastroenterology;  Laterality: N/A;   ESOPHAGOGASTRODUODENOSCOPY (EGD) WITH PROPOFOL N/A 06/15/2020   Procedure: ESOPHAGOGASTRODUODENOSCOPY (EGD) WITH PROPOFOL;  Surgeon: Milus Banister, MD;  Location: [facility];  Service: Gastroenterology;  Laterality: N/A;   TEE WITHOUT CARDIOVERSION N/A 05/02/2014   Procedure: TRANSESOPHAGEAL ECHOCARDIOGRAM (TEE);  Surgeon: Larey Dresser, MD;  Location: [place];  Service: Cardiovascular;  Laterality: N/A;   UPPER ESOPHAGEAL ENDOSCOPIC ULTRASOUND (EUS) N/A 06/15/2020   Procedure: UPPER ESOPHAGEAL ENDOSCOPIC ULTRASOUND (EUS);  Surgeon: Milus Banister, MD;  Location: [facility];  Service: Gastroenterology;  Laterality: N/A;   VIDEO BRONCHOSCOPY Bilateral 04/27/2014   Procedure: VIDEO BRONCHOSCOPY WITH FLUORO;  Surgeon: Rigoberto Noel, MD;  Location: [facility];  Service: Cardiopulmonary;  Laterality: Bilateral;   Family History  Problem Relation Age of Onset   Diabetes Mother    Stroke Mother    Cancer Father        ?   Alcoholism Father    Pancreatitis Sister    Diabetes Sister    Prostate cancer Brother    Colon cancer Paternal Uncle 65   Bipolar disorder Daughter    Esophageal cancer Neg Hx    Rectal cancer Neg Hx    Stomach cancer Neg Hx    Colon polyps Neg Hx    Social History   Socioeconomic History   Marital status:  Single    Spouse name: Not on file   Number of children: Not on file   Years of education: Not on file   Highest education level: Not on file  Occupational History   Not on file  Tobacco Use   Smoking status: Former    Packs/day: 0.20    Years: 35.00    Total pack years: 7.00    Types: Cigarettes   Smokeless tobacco: Never  Vaping Use   Vaping Use: Never used  Substance and Sexual Activity   Alcohol use: No    Alcohol/week: 0.0 standard drinks of alcohol   Drug use: Yes    Types: Marijuana    Comment: previous cocaine use, marijuana on 12/29   Sexual activity: Not Currently    Birth control/protection: Other-see comments, Surgical    Comment: offered condoms; 03/2019  Other Topics Concern   Not on file  Social History Narrative   Not on file   Social Determinants of Health   Financial Resource Strain: Low Risk  (03/01/2021)   Overall Financial Resource Strain (CARDIA)    Difficulty of Paying Living Expenses: Not hard at all  Food Insecurity: No Food Insecurity (03/01/2021)   Hunger Vital Sign    Worried About Running Out of Food in the Last Year: Never true    Ran Out of Food in the Last Year: Never true  Transportation Needs: No Transportation Needs (03/01/2021)   PRAPARE - Hydrologist (Medical): No    Lack of Transportation (Non-Medical): No  Physical Activity: Sufficiently Active (03/01/2021)   Exercise Vital Sign    Days of Exercise per Week: 7 days    Minutes of Exercise per Session: 30 min  Stress: No Stress Concern Present (03/01/2021)   [place]    Feeling of Stress : Not at all  Social Connections: Moderately Integrated (03/01/2021)   Social Connection and Isolation Panel [NHANES]    Frequency of Communication with Friends and Family: More than three times a week    Frequency of Social Gatherings with Friends and Family: More than three times a week    Attends  Religious Services: More than 4 times per year    Active Member of [employer] or Organizations: Yes  Attends Archivist Meetings: More than 4 times per year    Marital Status: Widowed    Tobacco Counseling Pt quit smoking over 2 yrs ago.  Smoked for 42 yrs, on average about 3 cigarettes a day.  Never 1/2 pk a day  Clinical Intake:     Pain : 0-10 Pain Score: 8  Pain Type: Other (Comment) Pain Location: Epigastric Pain Orientation: Upper Pain Descriptors / Indicators: Sharp, Cramping Pain Onset: Today Pain Frequency: Constant     How often do you need to have someone help you when you read instructions, pamphlets, or other written materials from your doctor or pharmacy?: 1 - Never  Diabetic?no  Interpreter Needed?: No      Activities of Daily Living    01/10/2022   10:15 AM 03/01/2021    5:42 PM  In your present state of health, do you have any difficulty performing the following activities:  Hearing? 0 0  Vision? 0 1  Difficulty concentrating or making decisions? 0 0  Walking or climbing stairs? 0 0  Dressing or bathing? 0 0  Doing errands, shopping? 0 0  Preparing Food and eating ? N N  Using the Toilet? N N  In the past six months, have you accidently leaked urine? N N  Do you have problems with loss of bowel control? N N  Managing your Medications? N N  Managing your Finances? N N  Housekeeping or managing your Housekeeping? N N    Patient Care Team: Ladell Pier, MD as PCP - General (Internal Medicine) ID: Dr. Tommy Medal Gastroenterologist: About to get established with Dr. Claudie Leach any recent Medical Services you may have received from other than [facility] providers in the past year (date may be approximate).     Assessment:   This is a routine wellness examination for [name] [name].  Hearing/Vision screen Whisper test normal bilaterally  Dietary issues and exercise activities discussed: Current Exercise Habits: Home exercise routine,  Type of exercise: walking;calisthenics, Time (Minutes): 45, Frequency (Times/Week): 7, Weekly Exercise (Minutes/Week): 315, Intensity: Mild, Exercise limited by: None identified   Goals Addressed   None   Depression Screen    01/10/2022   10:15 AM 03/01/2021    5:42 PM 03/01/2021    5:41 PM 03/01/2021    3:35 PM 01/11/2021    4:10 PM 05/19/2020    8:34 AM 05/04/2020   10:40 AM  PHQ 2/9 Scores  PHQ - 2 Score 0 0 0 0 0 0 1  PHQ- 9 Score      0     Fall Risk    01/10/2022   10:14 AM 03/01/2021    5:42 PM 03/01/2021    3:39 PM 01/11/2021   10:21 AM 04/12/2019   11:02 AM  [place] in the past year? 0 0 0 0 0  Number falls in past yr: 0 0 0 0   Injury with Fall? 0 0 0 0   Risk for fall due to : No Fall Risks  No Fall Risks No Fall Risks   Follow up Falls evaluation completed  Falls evaluation completed  Falls evaluation completed    [place]:  Any stairs in or around the home? No  If so, are there any without handrails?  NA Home free of loose throw rugs in walkways, pet beds, electrical cords, etc? Yes  Adequate lighting in your home to reduce risk of falls? Yes  ASSISTIVE DEVICES UTILIZED TO PREVENT FALLS:  Life alert? Yes  Use of a cane, walker or w/c? No  Grab bars in the bathroom? Yes  Shower chair or bench in shower? No  Elevated toilet seat or a handicapped toilet? Yes   TIMED UP AND GO:  Was the test performed? Yes .  Length of time to ambulate 10 feet: 8 sec.   Gait steady and fast without use of assistive device  Cognitive Function:        03/01/2021    5:43 PM 03/01/2021    3:40 PM  6CIT Screen  What Year? 0 points 0 points  What month? 0 points 0 points  What time? 0 points 0 points  Count back from 20 0 points 0 points  Months in reverse 0 points 0 points  Repeat phrase 0 points 0 points  Total Score 0 points 0 points    Immunizations Immunization History  Administered Date(s) Administered   Hepatitis  B 04/15/2008   Influenza, Seasonal, Injecte, Preservative Fre 03/01/2014   Influenza,inj,Quad PF,6+ Mos 03/13/2015, 04/12/2016, 04/07/2017, 04/23/2018, 03/25/2019, 05/04/2020   PFIZER(Purple Top)SARS-COV-2 Vaccination 10/04/2019, 10/25/2019   Pneumococcal Conjugate-13 07/03/2018   Pneumococcal Polysaccharide-23 03/01/2014   Tdap 09/22/2015    TDAP status: Up to date  Flu Vaccine status: Up to date  Pneumococcal vaccine status: Up to date  Covid-19 vaccine status: Completed vaccines Encouraged her to get the COVID-19 by Valent booster at her local pharmacy.  Qualifies for Shingles Vaccine? Yes   Zostavax completed No   Shingrix Completed?: No.    Education has been provided regarding the importance of this vaccine. Patient has been advised to call insurance company to determine out of pocket expense if they have not yet received this vaccine. Advised may also receive vaccine at local pharmacy or Health Dept. Verbalized acceptance and understanding. Patient advised to find out from her infectious disease specialist whether it is okay for her to receive the Shingrix vaccine.  I have printed the prescription and given it to her so that she can receive it at her outside pharmacy if okay with ID.  Screening Tests Health Maintenance  Topic Date Due   Zoster Vaccines- Shingrix (1 of 2) Never done   COVID-19 Vaccine (3 - Pfizer risk series) 11/22/2019   PAP SMEAR-Modifier  12/17/2019   MAMMOGRAM  03/24/2021   INFLUENZA VACCINE  01/15/2022   COLONOSCOPY (Pts 45-77yr Insurance coverage will need to be confirmed)  05/02/2022   TETANUS/TDAP  09/21/2025   Hepatitis C Screening  Completed   HIV Screening  Completed   HPV VACCINES  Aged Out    Health Maintenance  Health Maintenance Due  Topic Date Due   Zoster Vaccines- Shingrix (1 of 2) Never done   COVID-19 Vaccine (3 - Pfizer risk series) 11/22/2019   PAP SMEAR-Modifier  12/17/2019   MAMMOGRAM  03/24/2021    Colorectal cancer  screening: Type of screening: Colonoscopy. Completed 04/2021. Repeat every 1 years  Mammogram status: Ordered 01/10/2022. Pt provided with contact info and advised to call to schedule appt.    Lung Cancer Screening: (Low Dose CT Chest recommended if Age 63-80years, 30 pack-year currently smoking OR have quit w/in 15years.) does not qualify.   Lung Cancer Screening Referral: Patient does not qualify based on her pack-year history.  She is quit smoking 2 years ago.  Encouraged her to remain tobacco free  Additional Screening:  Hepatitis C Screening: does qualify; Completed 03/01/2021  Vision Screening: Recommended annual ophthalmology  exams for early detection of glaucoma and other disorders of the eye. Is the patient up to date with their annual eye exam?  No  Who is the provider or what is the name of the office in which the patient attends annual eye exams? NA If pt is not established with a provider, would they like to be referred to a provider to establish care? Yes .   Dental Screening: Recommended annual dental exams for proper oral hygiene.  She does not have a dentist but plans to establish with one.  I have encouraged her to do so as she has numerous cavities with some of the teeth broken off in the gum  Community Resource Referral / Chronic Care Management: CRR required this visit?  No   CCM required this visit?  No      Plan:    1. Encounter for Medicare annual wellness exam   2. Advance directive discussed with patient Discussed advanced directive, living will and healthcare power of attorney with her.  Patient is indicating that if she develops a severe illness where her chance of recovering is low, she would not want to be kept alive with aggressive measures.  Patient given a packet on advanced directive.  I went through and showed her how to complete the form for living will and healthcare power of attorney.  Advised that she gets it notarized after she completes it then  bring a copy for our record.  She expressed understanding.  3. Gastroenteritis Likely viral gastroenteritis.  However given her underlying history of HIV, I will pursue stool cultures and C. Difficile Recommend that she push fluids. BRAT diet for a few days recommended to help settle her stomach Purchase Imodium over-the-counter and use it. - Stool culture - Clostridium Difficile by PCR([facility] only )  4. Weight loss Likely multifactorial including history of HIV, history of polysubstance use (not sure if she is still using). Encouraged her to eat smaller more frequent meals.  Continue the Ensure for supplement.  5. Insomnia, unspecified type Good sleep hygiene discussed and encouraged. Patient advised not to drink any caffeinated beverages or excessive alcohol use within several hours of bedtime.  Advised to get in bed around about the same time every night.  Once in bed, turn off all lights and sounds.  If unable to fall asleep within 30 to 45 minutes of getting in bed, patient should get up and try to do something until she feels sleepy again.  At that time try getting back in bed. -Strongly recommend that she not drink a Coca-Cola within 4 to 5 hours of bedtime.  6. Essential hypertension Elevated today.  However patient states that she threw up her medications shortly after taking them this morning.  Advised to continue amlodipine and lisinopril.  Check blood pressure routinely with goal being 130/80 or lower  7. Systolic murmur Recommend getting an echo to evaluate this murmur - ECHOCARDIOGRAM COMPLETE; Future  8. Dental cavities Strongly advise getting in with a dentist for routine dental care  9. Encounter for screening mammogram for malignant neoplasm of breast - MM Digital Screening; Future  10. Need for shingles vaccine Prescription given for Shingrix vaccine.  Advised that she asked Dr. Tommy Medal whether it is okay for her to receive the vaccine.  I will also send him a  message. - Zoster Vaccine Adjuvanted [facility]) injection; Inject 0.5 mLs into the muscle once for 1 dose.  Dispense: 0.5 mL; Refill: 0  I have personally  reviewed and noted the following in the patient's chart:   Medical and social history Use of alcohol, tobacco or illicit drugs  Current medications and supplements including opioid prescriptions. Patient is not currently taking opioid prescriptions. Functional ability and status Nutritional status Physical activity Advanced directives List of other physicians Hospitalizations, surgeries, and ER visits in previous 12 months Vitals Screenings to include cognitive, depression, and falls Referrals and appointments  In addition, I have reviewed and discussed with patient certain preventive protocols, quality metrics, and best practice recommendations. A written personalized care plan for preventive services as well as general preventive health recommendations were provided to patient.   Of note, patient states that she left her cell phone in [place] but will have it as of this Sunday.  In the meantime, to reach her she requested that we call her friend at phone number [phone]   Karle Plumber, MD   01/10/2022

## 2022-01-10 NOTE — Patient Instructions (Addendum)
I have given you the prescription for the shingles vaccine.  Please find out from Dr. Tommy Medal whether it is okay for you to get this vaccine.  If it is, please get it from your outside pharmacy.  Please stop at the lab to give a stool sample today.   Push fluids in the form of water and Gatorade over the next 2 to 3 days.  Eating some applesauce with totals will also help to settle your stomach. Purchase and use Imodium as needed to help with the diarrhea.  You should call your insurance and find out which dentist is in network for your insurance.  Please call and schedule an appointment with a dentist as soon as possible.

## 2022-01-10 NOTE — Progress Notes (Signed)
HPI: [name] [name] is a 61 y.o. female who presents to the [facility] for HIV follow-up.  Patient Active Problem List   Diagnosis Date Noted   Depression, major, in remission ([place]) 12/10/2021   Hepatitis C 03/01/2021   Hx of substance abuse ([place]) 03/01/2021   Ulcerative colitis with rectal bleeding ([place]) 11/13/2020   Aortic atherosclerosis ([place]) 11/13/2020   Colitis 11/13/2020   Depression 05/04/2020   Grieving 05/04/2020   Chronic gastric ulcer with hemorrhage    Peptic ulcer disease 06/28/2019   History of colposcopy with cervical biopsy 06/28/2019   Primary biliary cirrhosis ([place]) 07/03/2018   LGSIL Pap smear of vagina 06/13/2017   Immunization due 04/07/2017   Calf cramp 12/17/2016   HIV disease ([place]) 02/02/2015   Current smoker 10/27/2014   Hyperlipidemia 08/24/2014   Vitamin D deficiency 08/24/2014   S/P hysterectomy 08/23/2014   Underweight 08/23/2014   Ulcerative colitis ([place]) 05/27/2014   HTN (hypertension) 05/27/2014   Anemia of chronic disease 05/27/2014   Insomnia 05/27/2014   Dental caries 05/10/2014   Cavitary lesion of lung 04/25/2014    Patient's Medications  New Prescriptions   BICTEGRAVIR-EMTRICITABINE-TENOFOVIR AF (BIKTARVY) 50-200-25 MG TABS TABLET    Take 1 tablet by mouth daily.   ZOSTER VACCINE ADJUVANTED (SHINGRIX) INJECTION    Inject 0.5 mLs into the muscle once for 1 dose.  Previous Medications   AMLODIPINE (NORVASC) 10 MG TABLET    Take 1 tablet (10 mg total) by mouth daily.   ATORVASTATIN (LIPITOR) 10 MG TABLET    TAKE 1 TABLET BY MOUTH  DAILY   BICTEGRAVIR-EMTRICITABINE-TENOFOVIR AF (BIKTARVY) 50-200-25 MG TABS TABLET    TAKE 1 TABLET BY MOUTH DAILY.   BICTEGRAVIR-EMTRICITABINE-TENOFOVIR AF (BIKTARVY) 50-200-25 MG TABS TABLET    Take 1 tablet by mouth daily.   BUDESONIDE (ENTOCORT EC) 3 MG 24 HR CAPSULE    Take 3 capsules (9 mg total) by mouth daily.   BUDESONIDE (ENTOCORT EC) 3 MG 24 HR CAPSULE    Take 3 capsules (9 mg total)  by mouth daily.   ESCITALOPRAM (LEXAPRO) 10 MG TABLET    Take 1 tablet (10 mg total) by mouth daily.   FAMOTIDINE (PEPCID) 20 MG TABLET    TAKE 1 TABLET BY MOUTH  TWICE DAILY   FEROSUL 325 (65 FE) MG TABLET    TAKE 1 TABLET BY MOUTH DAILY WITH BREAKFAST.   LISINOPRIL (ZESTRIL) 5 MG TABLET    Take 1 tablet (5 mg total) by mouth daily.   MESALAMINE (ROWASA) 4 G ENEMA    Insert enema rectally twice daily for 2 weeks, then reduce to once every day at bedtime.   MULTIPLE VITAMINS-MINERALS (MULTIVITAMIN WITH MINERALS) TABLET    Take 1 tablet by mouth daily.   PANTOPRAZOLE (PROTONIX) 40 MG TABLET    TAKE 1 TABLET BY MOUTH  DAILY   SULFASALAZINE (AZULFIDINE) 500 MG TABLET    TAKE 2 TABLETS BY MOUTH  TWICE DAILY   TRIAMCINOLONE CREAM (KENALOG) 0.1 %    Apply 1 application topically 2 (two) times daily.   URSODIOL (ACTIGALL) 250 MG TABLET    TAKE 1 TABLET BY MOUTH  TWICE DAILY  Modified Medications   No medications on file  Discontinued Medications   No medications on file    Allergies: No Known Allergies  Past Medical History: Past Medical History:  Diagnosis Date   Cavities 02/02/2015   Chronic gastric ulcer    Depression    hx of- no meds  at this time   GERD (gastroesophageal reflux disease) Dx 2014   on meds   GI bleed    Grieving 05/04/2020   HIV (human immunodeficiency virus infection) ([place]) Dx 2015   on meds   Hyperlipidemia    on meds   Hypertension Dx 2014   on meds   Primary biliary cirrhosis ([place]) 07/03/2018   Substance abuse ([place])    Quit 2008   Ulcerative colitis ([place])     Social History: Social History   Socioeconomic History   Marital status: Single    Spouse name: Not on file   Number of children: Not on file   Years of education: Not on file   Highest education level: Not on file  Occupational History   Not on file  Tobacco Use   Smoking status: Former    Packs/day: 0.20    Years: 35.00    Total pack years: 7.00    Types: Cigarettes   Smokeless  tobacco: Never  Vaping Use   Vaping Use: Never used  Substance and Sexual Activity   Alcohol use: No    Alcohol/week: 0.0 standard drinks of alcohol   Drug use: Yes    Types: Marijuana    Comment: previous cocaine use, marijuana on 12/29   Sexual activity: Not Currently    Birth control/protection: Other-see comments, Surgical    Comment: offered condoms; 03/2019  Other Topics Concern   Not on file  Social History Narrative   Not on file   Social Determinants of Health   Financial Resource Strain: Low Risk  (03/01/2021)   Overall Financial Resource Strain (CARDIA)    Difficulty of Paying Living Expenses: Not hard at all  Food Insecurity: No Food Insecurity (03/01/2021)   Hunger Vital Sign    Worried About Running Out of Food in the Last Year: Never true    Ran Out of Food in the Last Year: Never true  Transportation Needs: No Transportation Needs (03/01/2021)   PRAPARE - Hydrologist (Medical): No    Lack of Transportation (Non-Medical): No  Physical Activity: Sufficiently Active (03/01/2021)   Exercise Vital Sign    Days of Exercise per Week: 7 days    Minutes of Exercise per Session: 30 min  Stress: No Stress Concern Present (03/01/2021)   [place]    Feeling of Stress : Not at all  Social Connections: Moderately Integrated (03/01/2021)   Social Connection and Isolation Panel [NHANES]    Frequency of Communication with Friends and Family: More than three times a week    Frequency of Social Gatherings with Friends and Family: More than three times a week    Attends Religious Services: More than 4 times per year    Active Member of [employer] or Organizations: Yes    Attends Archivist Meetings: More than 4 times per year    Marital Status: Widowed    Labs: Lab Results  Component Value Date   HIV1RNAQUANT <20 04/20/2020   HIV1RNAQUANT <20 NOT DETECTED 04/12/2019    HIV1RNAQUANT <20 NOT DETECTED 09/08/2018   CD4TABS 528 04/20/2020   CD4TABS 685 04/12/2019   CD4TABS 500 09/08/2018    RPR and STI Lab Results  Component Value Date   LABRPR NON-REACTIVE 04/20/2020   LABRPR NON-REACTIVE 04/12/2019   LABRPR NON-REACTIVE 09/08/2018   LABRPR NON-REACTIVE 05/06/2018   LABRPR NON REAC 10/28/2016    STI Results GC CT  04/12/2019 11:07  AM Negative  Negative   09/08/2018 12:00 AM Negative  Negative   05/06/2018 12:00 AM Negative  Negative   07/03/2015 12:00 AM Negative  Negative   02/02/2015 12:00 AM Negative  Negative   10/26/2014 12:00 AM Negative  Negative   08/23/2014 12:00 AM NG: Negative  CT: Negative     Hepatitis B Lab Results  Component Value Date   HEPBSAB NON-REACTIVE 01/03/2021   HEPBSAG NON-REACTIVE 01/03/2021   HEPBCAB NON-REACTIVE 01/03/2021   Hepatitis C Lab Results  Component Value Date   HEPCAB REACTIVE (A) 01/03/2021   HCVRNAPCRQN <15 NOT DETECTED 01/03/2021   Hepatitis A Lab Results  Component Value Date   HAV Reactive (A) 04/28/2014   Lipids: Lab Results  Component Value Date   CHOL 223 (H) 04/20/2020   TRIG 94 04/20/2020   HDL 66 04/20/2020   CHOLHDL 3.4 04/20/2020   VLDL 16 10/28/2016   LDLCALC 137 (H) 04/20/2020    Current HIV Regimen: Biktarvy  Assessment: [name] [name] presents to clinic today for HIV follow-up after multiple missed appointments. She was last seen by Dr. Tommy Medal in November 2021. States she experienced multiple deaths in the family including both of her parents and her brother and that this caused significant trauma and challenge in her life. She states she is doing better now and wants to keep taking care of her health. She claims she has been taking Biktarvy every day since 2021 and that she has still received deliveries; however, her last refill was from December 2022. She certainly would have been out of medicine for months. She recently picked up samples which could impact her viral load  today. Will check HIV RNA with genotype nonetheless along with CD4 count, lipid panel, and CMP. Provided patient with one week of Biktarvy samples and sent new prescription to be mailed through [facility]. She also states she has been taking multivitamins at the same time as her [place]; encouraged her to separate these by several hours to mitigate any drug interaction issues. She presented today with Shingles prescription in hand which she plans to get soon at a pharmacy.  Plan: Continue Biktarvy Check HIV RNA with genotype, CMP, CD4 count, and lipid panel Follow-up with Dr. Tommy Medal on 04/24/22 at 8:45am   Alfonse Spruce, PharmD, CPP, [place] Clinical Pharmacist Practitioner [place] for Infectious Disease 01/10/2022, 11:59 AM

## 2022-01-10 NOTE — Progress Notes (Signed)
Pt states she has trouble sleeping.

## 2022-01-11 LAB — T-HELPER CELLS (CD4) COUNT (NOT AT ARMC)
CD4 % Helper T Cell: 20 % — ABNORMAL LOW (ref 33–65)
CD4 T Cell Abs: 265 /uL — ABNORMAL LOW (ref 400–1790)

## 2022-01-16 LAB — LIPID PANEL
Cholesterol: 237 mg/dL — ABNORMAL HIGH (ref <200)
HDL: 62 mg/dL (ref >=50)
LDL Cholesterol (Calc): 153 mg/dL (calc) — ABNORMAL HIGH
Non-HDL Cholesterol (Calc): 175 mg/dL (calc) — ABNORMAL HIGH (ref <130)
Total CHOL/HDL Ratio: 3.8 (calc) (ref <5.0)
Triglycerides: 107 mg/dL (ref <150)

## 2022-01-16 LAB — COMPREHENSIVE METABOLIC PANEL
AG Ratio: 1.2 (calc) (ref 1.0–2.5)
ALT: 17 U/L (ref 6–29)
AST: 23 U/L (ref 10–35)
Albumin: 4.5 g/dL (ref 3.6–5.1)
Alkaline phosphatase (APISO): 339 U/L — ABNORMAL HIGH (ref 37–153)
BUN: 15 mg/dL (ref 7–25)
CO2: 24 mmol/L (ref 20–32)
Calcium: 10.2 mg/dL (ref 8.6–10.4)
Chloride: 105 mmol/L (ref 98–110)
Creat: 0.78 mg/dL (ref 0.50–1.05)
Globulin: 3.9 g/dL (calc) — ABNORMAL HIGH (ref 1.9–3.7)
Glucose, Bld: 141 mg/dL — ABNORMAL HIGH (ref 65–99)
Potassium: 4.3 mmol/L (ref 3.5–5.3)
Sodium: 139 mmol/L (ref 135–146)
Total Bilirubin: 0.7 mg/dL (ref 0.2–1.2)
Total Protein: 8.4 g/dL — ABNORMAL HIGH (ref 6.1–8.1)

## 2022-01-16 LAB — HIV RNA, RTPCR W/R GT (RTI, PI,INT)
HIV 1 RNA Quant: 85 copies/mL — ABNORMAL HIGH
HIV-1 RNA Quant, Log: 1.93 Log copies/mL — ABNORMAL HIGH

## 2022-01-17 ENCOUNTER — Other Ambulatory Visit: Payer: Self-pay | Admitting: Pharmacist

## 2022-01-17 DIAGNOSIS — B20 Human immunodeficiency virus [HIV] disease: Secondary | ICD-10-CM

## 2022-01-17 MED ORDER — BIKTARVY 50-200-25 MG PO TABS: 1.0000 | ORAL_TABLET | Freq: Every day | ORAL | 0 refills | Status: AC

## 2022-01-17 NOTE — Progress Notes (Signed)
Medication Samples have been provided to the patient.  Drug name: Biktarvy        Strength: 50/200/25 mg       Qty: 7 tablets (1 bottle)   LOT: [place]   Exp.Date: 02/2024  Dosing instructions: Take one tablet by mouth once daily  The patient has been instructed regarding the correct time, dose, and frequency of taking this medication, including desired effects and most common side effects.   Alanis Clift L. Eber Hong, PharmD, BCIDP, AAHIVP, CPP Clinical Pharmacist Practitioner Infectious Diseases [place] for Infectious Disease 05/29/2020, 10:07 AM

## 2022-01-23 ENCOUNTER — Other Ambulatory Visit (HOSPITAL_COMMUNITY): Payer: Self-pay

## 2022-01-23 ENCOUNTER — Telehealth: Payer: Self-pay

## 2022-01-23 NOTE — Telephone Encounter (Signed)
[facility] Patient Advocate Encounter  [facility] and I have been unsuccsessful in reaching patient to be able to refill medication.    We have tried multiple times without a response.  Ileene Patrick, [facility] Patient [facility] for Infectious Disease Phone: [phone] Fax:  [phone]

## 2022-02-06 ENCOUNTER — Ambulatory Visit (HOSPITAL_COMMUNITY): Admission: RE | Admit: 2022-02-06 | Payer: Medicare Other | Source: Ambulatory Visit

## 2022-02-11 ENCOUNTER — Other Ambulatory Visit: Payer: Self-pay | Admitting: Gastroenterology

## 2022-02-11 ENCOUNTER — Other Ambulatory Visit (HOSPITAL_COMMUNITY): Payer: Self-pay

## 2022-02-11 DIAGNOSIS — K5190 Ulcerative colitis, unspecified, without complications: Secondary | ICD-10-CM

## 2022-02-12 ENCOUNTER — Ambulatory Visit: Payer: Medicare Other | Admitting: Internal Medicine

## 2022-02-25 ENCOUNTER — Other Ambulatory Visit (HOSPITAL_COMMUNITY): Payer: Self-pay

## 2022-02-26 ENCOUNTER — Other Ambulatory Visit (HOSPITAL_COMMUNITY): Payer: Self-pay

## 2022-02-28 ENCOUNTER — Telehealth: Payer: Self-pay | Admitting: Licensed Clinical Social Worker

## 2022-02-28 NOTE — Patient Outreach (Signed)
  Care Coordination   02/28/2022 Name: [name] [name] MRN: [id number] DOB: [birth year]   Care Coordination Outreach Attempts:  An unsuccessful telephone outreach was attempted today to offer the patient information about available care coordination services as a benefit of their health plan.   Follow Up Plan:  Additional outreach attempts will be made to offer the patient care coordination information and services.   Encounter Outcome:  No Answer  Care Coordination Interventions Activated:  No   Care Coordination Interventions:  No, not indicated    Christa See, MSW, [email or web address] Phone [phone] 4:58 PM

## 2022-03-18 ENCOUNTER — Other Ambulatory Visit (HOSPITAL_COMMUNITY): Payer: Self-pay

## 2022-03-26 ENCOUNTER — Other Ambulatory Visit (HOSPITAL_COMMUNITY): Payer: Self-pay

## 2022-03-28 ENCOUNTER — Ambulatory Visit: Payer: Self-pay

## 2022-03-28 NOTE — Patient Outreach (Signed)
  Care Coordination   03/28/2022 Name: [name] [name] MRN: [id number] DOB: 1960/10/17   Care Coordination Outreach Attempts:  A second unsuccessful outreach was attempted today to offer the patient with information about available care coordination services as a benefit of their health plan.     Follow Up Plan:  Additional outreach attempts will be made to offer the patient care coordination information and services.   Encounter Outcome:  No Answer  Care Coordination Interventions Activated:  No   Care Coordination Interventions:  No, not indicated    Daneen Schick, BSW, CDP Social Worker, Certified Dementia Practitioner [facility] Care Management  Care Coordination [phone]

## 2022-04-02 ENCOUNTER — Ambulatory Visit: Payer: Self-pay

## 2022-04-02 ENCOUNTER — Other Ambulatory Visit (HOSPITAL_COMMUNITY): Payer: Self-pay

## 2022-04-02 NOTE — Patient Outreach (Signed)
  Care Coordination   04/02/2022 Name: [name] [name] MRN: [id number] DOB: 05-Jul-1960   Care Coordination Outreach Attempts:  A third unsuccessful outreach was attempted today to offer the patient with information about available care coordination services as a benefit of their health plan.   Follow Up Plan:  No further outreach attempts will be made at this time. We have been unable to contact the patient to offer or enroll patient in care coordination services  Encounter Outcome:  No Answer  Care Coordination Interventions Activated:  No   Care Coordination Interventions:  No, not indicated    Daneen Schick, BSW, CDP Social Worker, Certified Dementia Practitioner [place] Coordination [phone]

## 2022-04-16 ENCOUNTER — Other Ambulatory Visit (HOSPITAL_BASED_OUTPATIENT_CLINIC_OR_DEPARTMENT_OTHER): Payer: Self-pay

## 2022-04-16 ENCOUNTER — Other Ambulatory Visit (HOSPITAL_COMMUNITY): Payer: Self-pay

## 2022-04-17 ENCOUNTER — Other Ambulatory Visit (HOSPITAL_COMMUNITY): Payer: Self-pay

## 2022-04-18 ENCOUNTER — Other Ambulatory Visit (HOSPITAL_COMMUNITY): Payer: Self-pay

## 2022-04-19 ENCOUNTER — Other Ambulatory Visit: Payer: Self-pay | Admitting: Gastroenterology

## 2022-04-19 DIAGNOSIS — K5190 Ulcerative colitis, unspecified, without complications: Secondary | ICD-10-CM

## 2022-04-23 ENCOUNTER — Other Ambulatory Visit (HOSPITAL_COMMUNITY): Payer: Self-pay

## 2022-04-24 ENCOUNTER — Telehealth: Payer: Self-pay

## 2022-04-24 ENCOUNTER — Ambulatory Visit: Payer: Medicare Other | Admitting: Infectious Disease

## 2022-04-24 NOTE — Telephone Encounter (Signed)
Attempted to call patient to reschedule missed appointment today. Will need to reschedule visit with labs two weeks before. Left voicemail requesting call back. Leatrice Jewels, RMA

## 2022-04-24 NOTE — Progress Notes (Deleted)
Chief complaint:   Subjective:    Patient ID: [name] [name], female    DOB: 02/02/1961, 61 y.o.   MRN: [id number]  HPI  This is a 61 year-old African-American lady with HIV, ulcerative colitis newly diagnosed primary biliary cirrhosis, hypertension usually on [place] to minimize drug drug interactions   I have not seen her since 04/2020 at thaet time she had been grieving loss of her  her brother to sudden cardiac death and a sister in law who also died recently.       Past Medical History:  Diagnosis Date   Cavities 02/02/2015   Chronic gastric ulcer    Depression    hx of- no meds at this time   GERD (gastroesophageal reflux disease) Dx 2014   on meds   GI bleed    Grieving 05/04/2020   HIV (human immunodeficiency virus infection) ([place]) Dx 2015   on meds   Hyperlipidemia    on meds   Hypertension Dx 2014   on meds   Primary biliary cirrhosis ([place]) 07/03/2018   Substance abuse ([place])    Quit 2008   Ulcerative colitis [facility])     Past Surgical History:  Procedure Laterality Date   ABDOMINAL HYSTERECTOMY  2001   done in [facility], patient unsure of indication   BIOPSY  04/27/2020   Procedure: BIOPSY;  Surgeon: Thornton Park, MD;  Location[facility];  Service: Gastroenterology;;   BIOPSY  06/15/2020   Procedure: BIOPSY;  Surgeon: Milus Banister, MD;  Location: [facility];  Service: Gastroenterology;;   COLONOSCOPY  2020   KB-MAC-suprep (adeq)   ESOPHAGOGASTRODUODENOSCOPY     ESOPHAGOGASTRODUODENOSCOPY (EGD) WITH PROPOFOL N/A 04/27/2020   Procedure: ESOPHAGOGASTRODUODENOSCOPY (EGD) WITH PROPOFOL;  Surgeon: Thornton Park, MD;  Location: [facility];  Service: Gastroenterology;  Laterality: N/A;   ESOPHAGOGASTRODUODENOSCOPY (EGD) WITH PROPOFOL N/A 06/15/2020   Procedure: ESOPHAGOGASTRODUODENOSCOPY (EGD) WITH PROPOFOL;  Surgeon: Milus Banister, MD;  Location: [facility];  Service: Gastroenterology;  Laterality: N/A;    TEE WITHOUT CARDIOVERSION N/A 05/02/2014   Procedure: TRANSESOPHAGEAL ECHOCARDIOGRAM (TEE);  Surgeon: Larey Dresser, MD;  Location: [facility];  Service: Cardiovascular;  Laterality: N/A;   UPPER ESOPHAGEAL ENDOSCOPIC ULTRASOUND (EUS) N/A 06/15/2020   Procedure: UPPER ESOPHAGEAL ENDOSCOPIC ULTRASOUND (EUS);  Surgeon: Milus Banister, MD;  Location: [facility];  Service: Gastroenterology;  Laterality: N/A;   VIDEO BRONCHOSCOPY Bilateral 04/27/2014   Procedure: VIDEO BRONCHOSCOPY WITH FLUORO;  Surgeon: Rigoberto Noel, MD;  Location: [facility];  Service: Cardiopulmonary;  Laterality: Bilateral;    Family History  Problem Relation Age of Onset   Diabetes Mother    Stroke Mother    Cancer Father        ?   Alcoholism Father    Pancreatitis Sister    Diabetes Sister    Prostate cancer Brother    Colon cancer Paternal Uncle 59   Bipolar disorder Daughter    Esophageal cancer Neg Hx    Rectal cancer Neg Hx    Stomach cancer Neg Hx    Colon polyps Neg Hx       Social History   Socioeconomic History   Marital status: Single    Spouse name: Not on file   Number of children: Not on file   Years of education: Not on file   Highest education level: Not on file  Occupational History   Not on file  Tobacco Use   Smoking status: Former    Packs/day:  0.20    Years: 35.00    Total pack years: 7.00    Types: Cigarettes   Smokeless tobacco: Never  Vaping Use   Vaping Use: Never used  Substance and Sexual Activity   Alcohol use: No    Alcohol/week: 0.0 standard drinks of alcohol   Drug use: Yes    Types: Marijuana    Comment: previous cocaine use, marijuana on 12/29   Sexual activity: Not Currently    Birth control/protection: Other-see comments, Surgical    Comment: offered condoms; 03/2019  Other Topics Concern   Not on file  Social History Narrative   Not on file   Social Determinants of Health   Financial Resource Strain: Low Risk  (03/01/2021)   Overall Financial  Resource Strain (CARDIA)    Difficulty of Paying Living Expenses: Not hard at all  Food Insecurity: No Food Insecurity (03/01/2021)   Hunger Vital Sign    Worried About Running Out of Food in the Last Year: Never true    Ran Out of Food in the Last Year: Never true  Transportation Needs: No Transportation Needs (03/01/2021)   PRAPARE - Hydrologist (Medical): No    Lack of Transportation (Non-Medical): No  Physical Activity: Sufficiently Active (03/01/2021)   Exercise Vital Sign    Days of Exercise per Week: 7 days    Minutes of Exercise per Session: 30 min  Stress: No Stress Concern Present (03/01/2021)   [place]    Feeling of Stress : Not at all  Social Connections: Moderately Integrated (03/01/2021)   Social Connection and Isolation Panel [NHANES]    Frequency of Communication with Friends and Family: More than three times a week    Frequency of Social Gatherings with Friends and Family: More than three times a week    Attends Religious Services: More than 4 times per year    Active Member of [employer] or Organizations: Yes    Attends Archivist Meetings: More than 4 times per year    Marital Status: Widowed    No Known Allergies   Current Outpatient Medications:    amLODipine (NORVASC) 10 MG tablet, Take 1 tablet (10 mg total) by mouth daily., Disp: 30 tablet, Rfl: 3   atorvastatin (LIPITOR) 10 MG tablet, TAKE 1 TABLET BY MOUTH  DAILY, Disp: 90 tablet, Rfl: 0   bictegravir-emtricitabine-tenofovir AF (BIKTARVY) 50-200-25 MG TABS tablet, TAKE 1 TABLET BY MOUTH DAILY., Disp: 30 tablet, Rfl: 0   bictegravir-emtricitabine-tenofovir AF (BIKTARVY) 50-200-25 MG TABS tablet, Take 1 tablet by mouth daily., Disp: 7 tablet, Rfl: 0   bictegravir-emtricitabine-tenofovir AF (BIKTARVY) 50-200-25 MG TABS tablet, Take 1 tablet by mouth daily., Disp: 30 tablet, Rfl: 2    bictegravir-emtricitabine-tenofovir AF (BIKTARVY) 50-200-25 MG TABS tablet, Take 1 tablet by mouth daily for 7 days., Disp: 7 tablet, Rfl: 0   budesonide (ENTOCORT EC) 3 MG 24 hr capsule, Take 3 capsules (9 mg total) by mouth daily., Disp: 90 capsule, Rfl: 0   budesonide (ENTOCORT EC) 3 MG 24 hr capsule, Take 3 capsules (9 mg total) by mouth daily., Disp: 90 capsule, Rfl: 0   escitalopram (LEXAPRO) 10 MG tablet, Take 1 tablet (10 mg total) by mouth daily., Disp: 30 tablet, Rfl: 3   famotidine (PEPCID) 20 MG tablet, TAKE 1 TABLET BY MOUTH  TWICE DAILY (Patient taking differently: Take 20 mg by mouth 2 (two) times daily.), Disp: 180 tablet, Rfl: 3  FEROSUL 325 (65 Fe) MG tablet, TAKE 1 TABLET BY MOUTH DAILY WITH BREAKFAST. (Patient taking differently: Take 325 mg by mouth daily with breakfast.), Disp: 90 tablet, Rfl: 3   lisinopril (ZESTRIL) 5 MG tablet, Take 1 tablet (5 mg total) by mouth daily., Disp: 30 tablet, Rfl: 4   mesalamine (ROWASA) 4 g enema, Insert enema rectally twice daily for 2 weeks, then reduce to once every day at bedtime. (Patient not taking: Reported on 01/10/2022), Disp: 3600 mL, Rfl: 3   Multiple Vitamins-Minerals (MULTIVITAMIN WITH MINERALS) tablet, Take 1 tablet by mouth daily., Disp: , Rfl:    pantoprazole (PROTONIX) 40 MG tablet, TAKE 1 TABLET BY MOUTH  DAILY (Patient taking differently: Take 40 mg by mouth daily.), Disp: 90 tablet, Rfl: 3   sulfaSALAzine (AZULFIDINE) 500 MG tablet, TAKE 2 TABLETS BY MOUTH  TWICE DAILY, Disp: 360 tablet, Rfl: 3   triamcinolone cream (KENALOG) 0.1 %, Apply 1 application topically 2 (two) times daily., Disp: 30 g, Rfl: 0   ursodiol (ACTIGALL) 250 MG tablet, TAKE 1 TABLET BY MOUTH  TWICE DAILY (Patient taking differently: Take 250 mg by mouth 2 (two) times daily.), Disp: 180 tablet, Rfl: 3   Review of Systems     Objective:   Physical Exam Constitutional:      Appearance: Normal appearance.  HENT:     Nose: Nose normal.  Eyes:      General:        Right eye: No discharge.        Left eye: No discharge.     Pupils: Pupils are equal, round, and reactive to light.  Cardiovascular:     Rate and Rhythm: Normal rate.  Pulmonary:     Effort: Pulmonary effort is normal. No respiratory distress.     Breath sounds: No wheezing.  Abdominal:     General: There is no distension.     Palpations: Abdomen is soft.  Musculoskeletal:        General: Normal range of motion.  Skin:    General: Skin is warm.  Neurological:     General: No focal deficit present.     Mental Status: She is alert and oriented to person, place, and time.  Psychiatric:        Mood and Affect: Mood normal.        Behavior: Behavior normal.        Thought Content: Thought content normal.        Judgment: Judgment normal.           Assessment & Plan:   HIV:and renew  BIKTARVY   Hypertension :  Needs to followup with PCP and also recommend getting home monitor  There were no vitals filed for this visit.  UC: followed by GI for this and PBC  LGSIL: followed closely by Ob/gyn  Depression: start lexapro and should see counselor  I spent greater than 40 minutes with the patient including greater than 50% of time in face to face counsel of the patient re her depressive ssx, grief, HTN  and in coordination of her care.

## 2022-05-04 DIAGNOSIS — R404 Transient alteration of awareness: Secondary | ICD-10-CM | POA: Diagnosis not present

## 2022-05-04 DIAGNOSIS — R0689 Other abnormalities of breathing: Secondary | ICD-10-CM | POA: Diagnosis not present

## 2022-05-04 DIAGNOSIS — Z743 Need for continuous supervision: Secondary | ICD-10-CM | POA: Diagnosis not present

## 2022-05-04 DIAGNOSIS — I469 Cardiac arrest, cause unspecified: Secondary | ICD-10-CM | POA: Diagnosis not present

## 2022-05-07 ENCOUNTER — Other Ambulatory Visit (HOSPITAL_COMMUNITY): Payer: Self-pay

## 2022-05-15 ENCOUNTER — Telehealth: Payer: Self-pay | Admitting: Internal Medicine

## 2022-05-15 NOTE — Telephone Encounter (Signed)
Phone call placed to the funeral home today.  I was informed that patient died May 16, 2022 at home.  They were unable to give me the time of death but states that they were called around 11 that evening to pick up the body.  I was told to call [facility] [employer] to get more information.  I called the [facility] Department and they referred me to the EMS supervisor.  He told me that they were called to the home after patient was found collapsed in the bathroom.  She was pronounced dead at 22:08 PM.  He states patient resided with her brother.  EMS was told that the patient complained of not feeling well that day and had gotten up to go to the bathroom and they heard a thump and found patient unresponsive. I was given the phone number for patient's brother Mr. [name] [name].  He tells me that the patient was not feeling well that day and was complaining of stomach pains.  She did not have any vomiting or diarrhea.  He gave me the phone number for patient's daughter Ms. [name] [name].  I called and spoke with [name] [name].  She tells me that the patient had lost a lot of weight over the past months.  For the past 1 to 2 weeks prior to her death, patient had complained of stomach pains and not able to pass her bowels.  She tells me that patient was drinking Pepto-Bismol and prune juice to try to stop the stomach pains and to get her bowel to pass.  She was not eating for the past week prior to her death.  She endorses that the patient did not have any vomiting that she knew of but had complained of some chest tightness and had a cough.  She tells me that patient had continued to smoke cigarettes.  In reviewing patient's record, I see that she was seeing the gastroenterologist Dr. Tarri Glenn last year for nonhealing gastric ulcer.  She also had history of inflammatory bowel disease and primary biliary cirrhosis.  I will complete patient's death certificate with primary diagnoses being most likely bowel obstruction  and gastric ulcer.

## 2022-05-15 NOTE — Telephone Encounter (Signed)
Langley Gauss w/ [employer] calling to ask if Dr Wynetta Emery will sign the pt's death certificate. Pt passed 2022/05/19 If Dr Wynetta Emery will not, please reach out to [facility].  However, Langley Gauss is unable to find Dr Wynetta Emery at this location.  She can only find her at [facility] in [place]. Can Dr Wynetta Emery still receive the death certificate if she sends it to her name? Please advise.  [phone]

## 2022-05-17 DEATH — deceased

## 2022-05-21 ENCOUNTER — Ambulatory Visit: Payer: Medicare Other | Admitting: Internal Medicine

## 2022-06-28 ENCOUNTER — Other Ambulatory Visit (HOSPITAL_COMMUNITY): Payer: Self-pay
